# Patient Record
Sex: Female | Born: 1940
Health system: Southern US, Community
[De-identification: ages and names within clinical notes are randomized; demographics above are authoritative.]

## PROBLEM LIST (undated history)

## (undated) DIAGNOSIS — I10 Essential (primary) hypertension: Secondary | ICD-10-CM

## (undated) DIAGNOSIS — Z8619 Personal history of other infectious and parasitic diseases: Secondary | ICD-10-CM

## (undated) DIAGNOSIS — Z923 Personal history of irradiation: Secondary | ICD-10-CM

## (undated) DIAGNOSIS — M1990 Unspecified osteoarthritis, unspecified site: Secondary | ICD-10-CM

## (undated) DIAGNOSIS — K760 Fatty (change of) liver, not elsewhere classified: Secondary | ICD-10-CM

## (undated) DIAGNOSIS — K759 Inflammatory liver disease, unspecified: Secondary | ICD-10-CM

## (undated) DIAGNOSIS — T4145XA Adverse effect of unspecified anesthetic, initial encounter: Secondary | ICD-10-CM

## (undated) DIAGNOSIS — T8859XA Other complications of anesthesia, initial encounter: Secondary | ICD-10-CM

## (undated) DIAGNOSIS — E039 Hypothyroidism, unspecified: Secondary | ICD-10-CM

## (undated) DIAGNOSIS — K648 Other hemorrhoids: Secondary | ICD-10-CM

## (undated) DIAGNOSIS — K635 Polyp of colon: Secondary | ICD-10-CM

## (undated) DIAGNOSIS — K769 Liver disease, unspecified: Secondary | ICD-10-CM

## (undated) DIAGNOSIS — F419 Anxiety disorder, unspecified: Secondary | ICD-10-CM

## (undated) HISTORY — PX: THYROIDECTOMY, PARTIAL: SHX18

## (undated) HISTORY — DX: Other hemorrhoids: K64.8

## (undated) HISTORY — PX: OTHER SURGICAL HISTORY: SHX169

## (undated) HISTORY — PX: MEDIAL PARTIAL KNEE REPLACEMENT: SHX5965

## (undated) HISTORY — DX: Personal history of other infectious and parasitic diseases: Z86.19

## (undated) HISTORY — PX: EYE SURGERY: SHX253

## (undated) HISTORY — DX: Polyp of colon: K63.5

## (undated) HISTORY — PX: COLONOSCOPY: SHX174

## (undated) HISTORY — DX: Fatty (change of) liver, not elsewhere classified: K76.0

## (undated) HISTORY — PX: FOOT SURGERY: SHX648

## (undated) HISTORY — PX: APPENDECTOMY: SHX54

## (undated) HISTORY — PX: VARICOSE VEIN SURGERY: SHX832

## (undated) HISTORY — PX: ABDOMINAL HYSTERECTOMY: SHX81

## (undated) HISTORY — DX: Liver disease, unspecified: K76.9

## (undated) HISTORY — PX: BACK SURGERY: SHX140

## (undated) HISTORY — PX: FRACTURE SURGERY: SHX138

---

## 1998-04-10 ENCOUNTER — Other Ambulatory Visit: Admission: RE | Admit: 1998-04-10 | Discharge: 1998-04-10 | Payer: Self-pay | Admitting: Internal Medicine

## 1999-01-16 ENCOUNTER — Encounter: Payer: Self-pay | Admitting: Internal Medicine

## 1999-01-16 ENCOUNTER — Ambulatory Visit (HOSPITAL_COMMUNITY): Admission: RE | Admit: 1999-01-16 | Discharge: 1999-01-16 | Payer: Self-pay | Admitting: Internal Medicine

## 2000-01-23 ENCOUNTER — Ambulatory Visit (HOSPITAL_COMMUNITY): Admission: RE | Admit: 2000-01-23 | Discharge: 2000-01-23 | Payer: Self-pay | Admitting: Internal Medicine

## 2000-01-23 ENCOUNTER — Encounter: Payer: Self-pay | Admitting: Internal Medicine

## 2001-02-01 ENCOUNTER — Encounter: Payer: Self-pay | Admitting: Internal Medicine

## 2001-02-01 ENCOUNTER — Ambulatory Visit (HOSPITAL_COMMUNITY): Admission: RE | Admit: 2001-02-01 | Discharge: 2001-02-01 | Payer: Self-pay | Admitting: Internal Medicine

## 2001-02-09 ENCOUNTER — Encounter: Admission: RE | Admit: 2001-02-09 | Discharge: 2001-02-09 | Payer: Self-pay | Admitting: Internal Medicine

## 2001-02-09 ENCOUNTER — Encounter: Payer: Self-pay | Admitting: Internal Medicine

## 2001-02-18 ENCOUNTER — Ambulatory Visit (HOSPITAL_BASED_OUTPATIENT_CLINIC_OR_DEPARTMENT_OTHER): Admission: RE | Admit: 2001-02-18 | Discharge: 2001-02-18 | Payer: Self-pay | Admitting: General Surgery

## 2001-02-18 ENCOUNTER — Encounter: Payer: Self-pay | Admitting: General Surgery

## 2001-02-18 ENCOUNTER — Encounter (INDEPENDENT_AMBULATORY_CARE_PROVIDER_SITE_OTHER): Payer: Self-pay | Admitting: *Deleted

## 2001-02-18 ENCOUNTER — Encounter: Admission: RE | Admit: 2001-02-18 | Discharge: 2001-02-18 | Payer: Self-pay | Admitting: Internal Medicine

## 2001-02-18 ENCOUNTER — Encounter: Payer: Self-pay | Admitting: Internal Medicine

## 2002-02-23 ENCOUNTER — Encounter: Admission: RE | Admit: 2002-02-23 | Discharge: 2002-02-23 | Payer: Self-pay | Admitting: Internal Medicine

## 2002-02-23 ENCOUNTER — Encounter: Payer: Self-pay | Admitting: Internal Medicine

## 2003-03-01 ENCOUNTER — Encounter: Admission: RE | Admit: 2003-03-01 | Discharge: 2003-03-01 | Payer: Self-pay | Admitting: Internal Medicine

## 2003-03-01 ENCOUNTER — Encounter: Payer: Self-pay | Admitting: Internal Medicine

## 2003-09-21 ENCOUNTER — Ambulatory Visit (HOSPITAL_BASED_OUTPATIENT_CLINIC_OR_DEPARTMENT_OTHER): Admission: RE | Admit: 2003-09-21 | Discharge: 2003-09-21 | Payer: Self-pay | Admitting: Orthopedic Surgery

## 2004-03-06 ENCOUNTER — Ambulatory Visit (HOSPITAL_COMMUNITY): Admission: RE | Admit: 2004-03-06 | Discharge: 2004-03-06 | Payer: Self-pay | Admitting: Internal Medicine

## 2004-08-09 ENCOUNTER — Ambulatory Visit (HOSPITAL_COMMUNITY): Admission: RE | Admit: 2004-08-09 | Discharge: 2004-08-09 | Payer: Self-pay | Admitting: Internal Medicine

## 2005-04-03 ENCOUNTER — Ambulatory Visit (HOSPITAL_COMMUNITY): Admission: RE | Admit: 2005-04-03 | Discharge: 2005-04-03 | Payer: Self-pay | Admitting: Internal Medicine

## 2005-05-13 ENCOUNTER — Ambulatory Visit: Payer: Self-pay | Admitting: Sports Medicine

## 2005-09-16 ENCOUNTER — Ambulatory Visit: Payer: Self-pay | Admitting: Oncology

## 2005-11-07 ENCOUNTER — Ambulatory Visit (HOSPITAL_COMMUNITY): Admission: RE | Admit: 2005-11-07 | Discharge: 2005-11-07 | Payer: Self-pay | Admitting: Internal Medicine

## 2005-12-26 ENCOUNTER — Ambulatory Visit: Payer: Self-pay | Admitting: Oncology

## 2006-01-07 ENCOUNTER — Encounter (INDEPENDENT_AMBULATORY_CARE_PROVIDER_SITE_OTHER): Payer: Self-pay | Admitting: *Deleted

## 2006-01-07 ENCOUNTER — Ambulatory Visit (HOSPITAL_COMMUNITY): Admission: RE | Admit: 2006-01-07 | Discharge: 2006-01-07 | Payer: Self-pay | Admitting: Obstetrics and Gynecology

## 2006-01-22 ENCOUNTER — Ambulatory Visit: Payer: Self-pay | Admitting: Sports Medicine

## 2006-02-05 ENCOUNTER — Ambulatory Visit: Payer: Self-pay | Admitting: Sports Medicine

## 2006-02-06 ENCOUNTER — Encounter: Admission: RE | Admit: 2006-02-06 | Discharge: 2006-02-06 | Payer: Self-pay | Admitting: Sports Medicine

## 2006-02-16 ENCOUNTER — Ambulatory Visit (HOSPITAL_COMMUNITY): Admission: RE | Admit: 2006-02-16 | Discharge: 2006-02-17 | Payer: Self-pay | Admitting: Neurosurgery

## 2006-04-08 ENCOUNTER — Ambulatory Visit (HOSPITAL_COMMUNITY): Admission: RE | Admit: 2006-04-08 | Discharge: 2006-04-08 | Payer: Self-pay | Admitting: Internal Medicine

## 2006-06-25 ENCOUNTER — Encounter: Admission: RE | Admit: 2006-06-25 | Discharge: 2006-08-06 | Payer: Self-pay | Admitting: Neurosurgery

## 2006-09-25 ENCOUNTER — Ambulatory Visit: Payer: Self-pay | Admitting: Internal Medicine

## 2006-10-06 ENCOUNTER — Ambulatory Visit: Payer: Self-pay | Admitting: Internal Medicine

## 2006-10-08 ENCOUNTER — Encounter: Admission: RE | Admit: 2006-10-08 | Discharge: 2006-10-08 | Payer: Self-pay | Admitting: Internal Medicine

## 2007-01-28 ENCOUNTER — Ambulatory Visit: Payer: Self-pay | Admitting: Sports Medicine

## 2007-01-28 DIAGNOSIS — I10 Essential (primary) hypertension: Secondary | ICD-10-CM | POA: Insufficient documentation

## 2007-01-28 DIAGNOSIS — M949 Disorder of cartilage, unspecified: Secondary | ICD-10-CM

## 2007-01-28 DIAGNOSIS — M719 Bursopathy, unspecified: Secondary | ICD-10-CM

## 2007-01-28 DIAGNOSIS — M19049 Primary osteoarthritis, unspecified hand: Secondary | ICD-10-CM | POA: Insufficient documentation

## 2007-01-28 DIAGNOSIS — M899 Disorder of bone, unspecified: Secondary | ICD-10-CM | POA: Insufficient documentation

## 2007-01-28 DIAGNOSIS — M67919 Unspecified disorder of synovium and tendon, unspecified shoulder: Secondary | ICD-10-CM | POA: Insufficient documentation

## 2007-03-04 ENCOUNTER — Ambulatory Visit: Payer: Self-pay | Admitting: Sports Medicine

## 2007-04-08 ENCOUNTER — Ambulatory Visit: Payer: Self-pay | Admitting: Sports Medicine

## 2007-05-04 ENCOUNTER — Ambulatory Visit (HOSPITAL_COMMUNITY): Admission: RE | Admit: 2007-05-04 | Discharge: 2007-05-04 | Payer: Self-pay | Admitting: Internal Medicine

## 2007-05-04 ENCOUNTER — Encounter: Payer: Self-pay | Admitting: Sports Medicine

## 2007-05-10 ENCOUNTER — Encounter: Admission: RE | Admit: 2007-05-10 | Discharge: 2007-05-10 | Payer: Self-pay | Admitting: Orthopedic Surgery

## 2007-05-14 ENCOUNTER — Encounter: Admission: RE | Admit: 2007-05-14 | Discharge: 2007-05-14 | Payer: Self-pay | Admitting: Internal Medicine

## 2007-05-24 ENCOUNTER — Encounter: Admission: RE | Admit: 2007-05-24 | Discharge: 2007-05-24 | Payer: Self-pay | Admitting: Orthopedic Surgery

## 2007-06-02 ENCOUNTER — Encounter: Payer: Self-pay | Admitting: Sports Medicine

## 2007-06-15 ENCOUNTER — Encounter: Admission: RE | Admit: 2007-06-15 | Discharge: 2007-06-15 | Payer: Self-pay | Admitting: Orthopedic Surgery

## 2007-07-07 ENCOUNTER — Encounter: Payer: Self-pay | Admitting: Sports Medicine

## 2007-08-16 ENCOUNTER — Ambulatory Visit: Payer: Self-pay | Admitting: Oncology

## 2007-08-30 ENCOUNTER — Telehealth: Payer: Self-pay | Admitting: Sports Medicine

## 2007-09-01 ENCOUNTER — Telehealth: Payer: Self-pay | Admitting: Sports Medicine

## 2007-09-06 ENCOUNTER — Encounter: Payer: Self-pay | Admitting: Sports Medicine

## 2007-09-08 ENCOUNTER — Encounter (INDEPENDENT_AMBULATORY_CARE_PROVIDER_SITE_OTHER): Payer: Self-pay | Admitting: *Deleted

## 2007-09-16 ENCOUNTER — Ambulatory Visit: Payer: Self-pay | Admitting: Family Medicine

## 2007-09-16 DIAGNOSIS — G608 Other hereditary and idiopathic neuropathies: Secondary | ICD-10-CM | POA: Insufficient documentation

## 2007-10-27 ENCOUNTER — Ambulatory Visit: Payer: Self-pay | Admitting: Sports Medicine

## 2007-11-03 ENCOUNTER — Encounter: Payer: Self-pay | Admitting: Sports Medicine

## 2007-11-05 ENCOUNTER — Ambulatory Visit: Payer: Self-pay | Admitting: Family Medicine

## 2007-11-05 DIAGNOSIS — E785 Hyperlipidemia, unspecified: Secondary | ICD-10-CM | POA: Insufficient documentation

## 2007-11-05 LAB — CONVERTED CEMR LAB
Cholesterol: 167 mg/dL
HDL: 45 mg/dL
LDL Cholesterol: 79 mg/dL
Triglycerides: 197 mg/dL

## 2007-11-08 ENCOUNTER — Encounter: Payer: Self-pay | Admitting: Family Medicine

## 2007-11-08 ENCOUNTER — Encounter: Payer: Self-pay | Admitting: *Deleted

## 2007-11-15 ENCOUNTER — Telehealth: Payer: Self-pay | Admitting: *Deleted

## 2007-11-22 ENCOUNTER — Ambulatory Visit: Payer: Self-pay | Admitting: Family Medicine

## 2007-12-02 ENCOUNTER — Encounter: Admission: RE | Admit: 2007-12-02 | Discharge: 2007-12-02 | Payer: Self-pay | Admitting: Neurosurgery

## 2007-12-22 ENCOUNTER — Telehealth: Payer: Self-pay | Admitting: *Deleted

## 2007-12-23 ENCOUNTER — Telehealth: Payer: Self-pay | Admitting: Family Medicine

## 2007-12-31 ENCOUNTER — Ambulatory Visit: Payer: Self-pay | Admitting: Family Medicine

## 2007-12-31 LAB — CONVERTED CEMR LAB
Direct LDL: 169 mg/dL — ABNORMAL HIGH
TSH: 0.529 microintl units/mL (ref 0.350–5.50)

## 2008-01-05 ENCOUNTER — Encounter: Payer: Self-pay | Admitting: Family Medicine

## 2008-01-06 ENCOUNTER — Encounter: Admission: RE | Admit: 2008-01-06 | Discharge: 2008-01-07 | Payer: Self-pay | Admitting: Physician Assistant

## 2008-02-17 ENCOUNTER — Telehealth: Payer: Self-pay | Admitting: *Deleted

## 2008-02-29 ENCOUNTER — Encounter: Admission: RE | Admit: 2008-02-29 | Discharge: 2008-03-23 | Payer: Self-pay | Admitting: Orthopedic Surgery

## 2008-03-01 ENCOUNTER — Ambulatory Visit: Payer: Self-pay | Admitting: Family Medicine

## 2008-04-12 ENCOUNTER — Telehealth: Payer: Self-pay | Admitting: Family Medicine

## 2008-04-12 ENCOUNTER — Ambulatory Visit: Payer: Self-pay | Admitting: Family Medicine

## 2008-04-12 ENCOUNTER — Telehealth: Payer: Self-pay | Admitting: *Deleted

## 2008-05-01 ENCOUNTER — Encounter: Payer: Self-pay | Admitting: Sports Medicine

## 2008-05-04 ENCOUNTER — Encounter: Admission: RE | Admit: 2008-05-04 | Discharge: 2008-05-04 | Payer: Self-pay | Admitting: Family Medicine

## 2008-05-19 ENCOUNTER — Encounter: Admission: RE | Admit: 2008-05-19 | Discharge: 2008-06-01 | Payer: Self-pay | Admitting: Orthopedic Surgery

## 2008-05-24 ENCOUNTER — Ambulatory Visit: Payer: Self-pay | Admitting: Family Medicine

## 2008-06-30 ENCOUNTER — Telehealth: Payer: Self-pay | Admitting: *Deleted

## 2008-06-30 ENCOUNTER — Encounter: Payer: Self-pay | Admitting: Family Medicine

## 2008-07-26 ENCOUNTER — Ambulatory Visit: Payer: Self-pay | Admitting: Family Medicine

## 2008-07-26 DIAGNOSIS — E039 Hypothyroidism, unspecified: Secondary | ICD-10-CM | POA: Insufficient documentation

## 2008-07-26 LAB — CONVERTED CEMR LAB
ALT: 14 units/L (ref 0–35)
AST: 16 units/L (ref 0–37)
Albumin: 4.4 g/dL (ref 3.5–5.2)
Alkaline Phosphatase: 66 units/L (ref 39–117)
BUN: 27 mg/dL — ABNORMAL HIGH (ref 6–23)
CO2: 25 meq/L (ref 19–32)
Calcium: 9.6 mg/dL (ref 8.4–10.5)
Chloride: 100 meq/L (ref 96–112)
Creatinine, Ser: 0.99 mg/dL (ref 0.40–1.20)
Direct LDL: 128 mg/dL — ABNORMAL HIGH
Glucose, Bld: 83 mg/dL (ref 70–99)
Potassium: 5 meq/L (ref 3.5–5.3)
Sodium: 138 meq/L (ref 135–145)
TSH: 0.688 microintl units/mL (ref 0.350–4.50)
Total Bilirubin: 0.3 mg/dL (ref 0.3–1.2)
Total Protein: 7 g/dL (ref 6.0–8.3)

## 2008-07-28 ENCOUNTER — Encounter: Payer: Self-pay | Admitting: Family Medicine

## 2008-08-08 ENCOUNTER — Encounter: Payer: Self-pay | Admitting: Family Medicine

## 2008-08-17 ENCOUNTER — Encounter: Payer: Self-pay | Admitting: Family Medicine

## 2008-09-08 ENCOUNTER — Ambulatory Visit: Payer: Self-pay | Admitting: Family Medicine

## 2008-09-26 ENCOUNTER — Telehealth (INDEPENDENT_AMBULATORY_CARE_PROVIDER_SITE_OTHER): Payer: Self-pay | Admitting: *Deleted

## 2008-10-11 ENCOUNTER — Ambulatory Visit: Payer: Self-pay | Admitting: Family Medicine

## 2008-10-13 ENCOUNTER — Encounter: Payer: Self-pay | Admitting: Family Medicine

## 2008-11-07 ENCOUNTER — Encounter: Payer: Self-pay | Admitting: Family Medicine

## 2008-11-29 ENCOUNTER — Ambulatory Visit: Payer: Self-pay | Admitting: Family Medicine

## 2008-12-28 ENCOUNTER — Encounter: Payer: Self-pay | Admitting: Family Medicine

## 2009-01-23 ENCOUNTER — Encounter: Payer: Self-pay | Admitting: Family Medicine

## 2009-02-08 ENCOUNTER — Telehealth (INDEPENDENT_AMBULATORY_CARE_PROVIDER_SITE_OTHER): Payer: Self-pay | Admitting: *Deleted

## 2009-02-15 ENCOUNTER — Encounter: Payer: Self-pay | Admitting: Family Medicine

## 2009-02-16 ENCOUNTER — Encounter: Payer: Self-pay | Admitting: Family Medicine

## 2009-02-20 ENCOUNTER — Ambulatory Visit: Payer: Self-pay | Admitting: Family Medicine

## 2009-02-20 ENCOUNTER — Encounter (INDEPENDENT_AMBULATORY_CARE_PROVIDER_SITE_OTHER): Payer: Self-pay | Admitting: Family Medicine

## 2009-02-20 ENCOUNTER — Telehealth: Payer: Self-pay | Admitting: Family Medicine

## 2009-02-21 LAB — CONVERTED CEMR LAB
ALT: 23 units/L (ref 0–35)
AST: 22 units/L (ref 0–37)
Albumin: 4.6 g/dL (ref 3.5–5.2)
Alkaline Phosphatase: 62 units/L (ref 39–117)
BUN: 19 mg/dL (ref 6–23)
CO2: 25 meq/L (ref 19–32)
Calcium: 9.6 mg/dL (ref 8.4–10.5)
Chloride: 93 meq/L — ABNORMAL LOW (ref 96–112)
Creatinine, Ser: 1.04 mg/dL (ref 0.40–1.20)
Glucose, Bld: 107 mg/dL — ABNORMAL HIGH (ref 70–99)
Potassium: 5.3 meq/L (ref 3.5–5.3)
Sodium: 130 meq/L — ABNORMAL LOW (ref 135–145)
Total Bilirubin: 0.3 mg/dL (ref 0.3–1.2)
Total Protein: 7.2 g/dL (ref 6.0–8.3)

## 2009-02-28 ENCOUNTER — Ambulatory Visit: Payer: Self-pay | Admitting: Family Medicine

## 2009-02-28 DIAGNOSIS — R5383 Other fatigue: Secondary | ICD-10-CM | POA: Insufficient documentation

## 2009-02-28 DIAGNOSIS — R5381 Other malaise: Secondary | ICD-10-CM | POA: Insufficient documentation

## 2009-02-28 LAB — CONVERTED CEMR LAB
ALT: 17 units/L (ref 0–35)
AST: 19 units/L (ref 0–37)
Albumin: 4.6 g/dL (ref 3.5–5.2)
Alkaline Phosphatase: 65 units/L (ref 39–117)
BUN: 15 mg/dL (ref 6–23)
Basophils Absolute: 0 10*3/uL (ref 0.0–0.1)
Basophils Relative: 0 % (ref 0–1)
CO2: 27 meq/L (ref 19–32)
Calcium: 9.4 mg/dL (ref 8.4–10.5)
Chloride: 99 meq/L (ref 96–112)
Creatinine, Ser: 0.93 mg/dL (ref 0.40–1.20)
Eosinophils Absolute: 0.2 10*3/uL (ref 0.0–0.7)
Eosinophils Relative: 2 % (ref 0–5)
Glucose, Bld: 81 mg/dL (ref 70–99)
HCT: 39.1 % (ref 36.0–46.0)
Hemoglobin: 12.5 g/dL (ref 12.0–15.0)
Lymphocytes Relative: 19 % (ref 12–46)
Lymphs Abs: 1.9 10*3/uL (ref 0.7–4.0)
MCHC: 32 g/dL (ref 30.0–36.0)
MCV: 102.9 fL — ABNORMAL HIGH (ref 78.0–100.0)
Monocytes Absolute: 1 10*3/uL (ref 0.1–1.0)
Monocytes Relative: 10 % (ref 3–12)
Neutro Abs: 6.8 10*3/uL (ref 1.7–7.7)
Neutrophils Relative %: 69 % (ref 43–77)
Platelets: 353 10*3/uL (ref 150–400)
Potassium: 4.5 meq/L (ref 3.5–5.3)
RBC: 3.8 M/uL — ABNORMAL LOW (ref 3.87–5.11)
RDW: 13.3 % (ref 11.5–15.5)
Sodium: 138 meq/L (ref 135–145)
TSH: 0.19 microintl units/mL — ABNORMAL LOW (ref 0.350–4.500)
Total Bilirubin: 0.3 mg/dL (ref 0.3–1.2)
Total Protein: 7 g/dL (ref 6.0–8.3)
Vitamin B-12: 874 pg/mL (ref 211–911)
WBC: 9.9 10*3/uL (ref 4.0–10.5)

## 2009-03-01 ENCOUNTER — Telehealth: Payer: Self-pay | Admitting: Family Medicine

## 2009-03-02 ENCOUNTER — Encounter: Payer: Self-pay | Admitting: Family Medicine

## 2009-04-04 ENCOUNTER — Ambulatory Visit: Payer: Self-pay | Admitting: Family Medicine

## 2009-04-04 DIAGNOSIS — D518 Other vitamin B12 deficiency anemias: Secondary | ICD-10-CM | POA: Insufficient documentation

## 2009-04-04 DIAGNOSIS — D531 Other megaloblastic anemias, not elsewhere classified: Secondary | ICD-10-CM | POA: Insufficient documentation

## 2009-04-04 LAB — CONVERTED CEMR LAB
Free T4: 1.27 ng/dL (ref 0.80–1.80)
T3, Free: 3.2 pg/mL (ref 2.3–4.2)
TSH: 0.565 microintl units/mL (ref 0.350–4.500)

## 2009-04-05 ENCOUNTER — Encounter: Payer: Self-pay | Admitting: Family Medicine

## 2009-04-24 ENCOUNTER — Encounter: Payer: Self-pay | Admitting: Family Medicine

## 2009-05-16 ENCOUNTER — Telehealth: Payer: Self-pay | Admitting: Family Medicine

## 2009-05-17 ENCOUNTER — Ambulatory Visit: Payer: Self-pay | Admitting: Family Medicine

## 2009-05-22 ENCOUNTER — Encounter: Payer: Self-pay | Admitting: Family Medicine

## 2009-05-29 ENCOUNTER — Encounter: Admission: RE | Admit: 2009-05-29 | Discharge: 2009-05-29 | Payer: Self-pay | Admitting: Family Medicine

## 2009-05-31 ENCOUNTER — Encounter: Payer: Self-pay | Admitting: Family Medicine

## 2009-06-05 ENCOUNTER — Ambulatory Visit: Payer: Self-pay | Admitting: Family Medicine

## 2009-06-05 DIAGNOSIS — M79609 Pain in unspecified limb: Secondary | ICD-10-CM | POA: Insufficient documentation

## 2009-06-06 ENCOUNTER — Ambulatory Visit: Payer: Self-pay | Admitting: Family Medicine

## 2009-06-06 DIAGNOSIS — L600 Ingrowing nail: Secondary | ICD-10-CM | POA: Insufficient documentation

## 2009-06-18 ENCOUNTER — Telehealth: Payer: Self-pay | Admitting: Family Medicine

## 2009-07-12 ENCOUNTER — Ambulatory Visit: Payer: Self-pay | Admitting: Family Medicine

## 2009-08-10 ENCOUNTER — Telehealth (INDEPENDENT_AMBULATORY_CARE_PROVIDER_SITE_OTHER): Payer: Self-pay | Admitting: *Deleted

## 2009-08-15 ENCOUNTER — Ambulatory Visit: Payer: Self-pay | Admitting: Family Medicine

## 2009-08-15 DIAGNOSIS — G4700 Insomnia, unspecified: Secondary | ICD-10-CM

## 2009-08-15 DIAGNOSIS — F5104 Psychophysiologic insomnia: Secondary | ICD-10-CM | POA: Insufficient documentation

## 2009-12-05 ENCOUNTER — Ambulatory Visit: Payer: Self-pay | Admitting: Family Medicine

## 2009-12-05 DIAGNOSIS — G894 Chronic pain syndrome: Secondary | ICD-10-CM | POA: Insufficient documentation

## 2009-12-05 LAB — CONVERTED CEMR LAB
Cholesterol: 177 mg/dL (ref 0–200)
HDL: 46 mg/dL (ref >39)
LDL Cholesterol: 89 mg/dL (ref 0–99)
TSH: 0.71 microintl units/mL (ref 0.350–4.500)
Total CHOL/HDL Ratio: 3.8
Triglycerides: 210 mg/dL — ABNORMAL HIGH (ref <150)
VLDL: 42 mg/dL — ABNORMAL HIGH (ref 0–40)
Vitamin B-12: 1474 pg/mL — ABNORMAL HIGH (ref 211–911)

## 2009-12-07 ENCOUNTER — Encounter: Payer: Self-pay | Admitting: Family Medicine

## 2009-12-20 ENCOUNTER — Telehealth: Payer: Self-pay | Admitting: Family Medicine

## 2010-04-09 ENCOUNTER — Encounter: Payer: Self-pay | Admitting: Family Medicine

## 2010-04-10 ENCOUNTER — Encounter: Payer: Self-pay | Admitting: Family Medicine

## 2010-04-11 ENCOUNTER — Encounter: Admission: RE | Admit: 2010-04-11 | Discharge: 2010-04-11 | Payer: Self-pay | Admitting: Unknown Physician Specialty

## 2010-04-15 ENCOUNTER — Ambulatory Visit (HOSPITAL_BASED_OUTPATIENT_CLINIC_OR_DEPARTMENT_OTHER): Admission: RE | Admit: 2010-04-15 | Discharge: 2010-04-16 | Payer: Self-pay | Admitting: Unknown Physician Specialty

## 2010-05-07 ENCOUNTER — Telehealth: Payer: Self-pay | Admitting: Family Medicine

## 2010-07-22 ENCOUNTER — Encounter: Admission: RE | Admit: 2010-07-22 | Discharge: 2010-07-22 | Payer: Self-pay | Admitting: Family Medicine

## 2010-08-14 ENCOUNTER — Ambulatory Visit: Payer: Self-pay | Admitting: Family Medicine

## 2010-08-14 LAB — CONVERTED CEMR LAB
ALT: 14 units/L (ref 0–35)
AST: 19 units/L (ref 0–37)
Albumin: 4.6 g/dL (ref 3.5–5.2)
Alkaline Phosphatase: 56 units/L (ref 39–117)
BUN: 17 mg/dL (ref 6–23)
CO2: 26 meq/L (ref 19–32)
Calcium: 9.3 mg/dL (ref 8.4–10.5)
Chloride: 98 meq/L (ref 96–112)
Creatinine, Ser: 0.95 mg/dL (ref 0.40–1.20)
Glucose, Bld: 84 mg/dL (ref 70–99)
Potassium: 4.9 meq/L (ref 3.5–5.3)
Sodium: 136 meq/L (ref 135–145)
TSH: 0.373 microintl units/mL (ref 0.350–4.500)
Total Bilirubin: 0.3 mg/dL (ref 0.3–1.2)
Total Protein: 6.6 g/dL (ref 6.0–8.3)

## 2010-08-19 ENCOUNTER — Encounter: Payer: Self-pay | Admitting: Family Medicine

## 2010-10-25 ENCOUNTER — Encounter: Payer: Self-pay | Admitting: *Deleted

## 2010-11-12 ENCOUNTER — Encounter: Payer: Self-pay | Admitting: Family Medicine

## 2010-12-16 ENCOUNTER — Encounter
Admission: RE | Admit: 2010-12-16 | Discharge: 2010-12-16 | Payer: Self-pay | Source: Home / Self Care | Attending: Internal Medicine | Admitting: Internal Medicine

## 2010-12-16 ENCOUNTER — Encounter: Payer: Self-pay | Admitting: Family Medicine

## 2010-12-19 ENCOUNTER — Other Ambulatory Visit (HOSPITAL_COMMUNITY): Payer: Self-pay | Admitting: Internal Medicine

## 2010-12-19 DIAGNOSIS — E041 Nontoxic single thyroid nodule: Secondary | ICD-10-CM

## 2010-12-23 ENCOUNTER — Other Ambulatory Visit: Payer: Self-pay | Admitting: Endocrinology

## 2010-12-23 DIAGNOSIS — E041 Nontoxic single thyroid nodule: Secondary | ICD-10-CM

## 2010-12-24 NOTE — Assessment & Plan Note (Signed)
Summary: routine visit/el   Vital Signs:  Patient Profile:   70 Years Old Female Height:     64 inches Pulse rate:   67 / minute BP sitting:   110 / 63  (right arm)  Pt. in pain?   yes    Location:   right shoulder    Intensity:   4  Vitals Entered By: Arlyss Repress CMA, (March 01, 2008 11:24 AM)                  Chief Complaint:  low BP. right shoulder pain.  History of Present Illness: had surgery on foot at [facility]. Went well. Incast 2 months and now fracture boot w 1/3 wt bearing alloweed. Noted her BP has been running really low---has decreased diuretic tab to 3x per week and is doing fine but pressure still much lower.  wasstarted on soma for back spasm  right shoulder giving her fits--she has tentatively been dx w rotator cuff problems and previous "spur' difficulty sleeping at night because of this and difficulty w anything she has to do overhead.  has noted more "hot flashes' since her surgery--has been off estrogen for 2 years.    Current Allergies: * DILAUDID * FENTANYL  Past Medical History:    Reviewed history from 11/05/2007 and no changes required:       goiter - multinodular - dr sevier       Hypertension       Osteoarthritis       Osteopenia       S/P laminectomy L4 for synovial cyst with neural impingement       Dr Hassan Buckler is rheumatologist  Past Surgical History:    Acromioplasty for spurs ; DJD in 2002 for both rt and lt shoulders    L5-S1 fusion 2008 Dr Lucilla Lame    left ankle surgery--tendon transfer and achilles lengthening at [facility] 12/2007     Review of Systems  The patient denies fever, weight loss, chest pain, syncope, dyspnea on exhertion, peripheral edema, and abdominal pain.     Physical Exam  General:     alert and well-developed.   Eyes:     pupils round.   Neck:     supple, full ROM, and no masses.   Lungs:     normal respiratory effort and normal breath sounds.   Heart:     normal rate, regular rhythm, and no murmur.     Msk:     left ankle in fracture boot---using scooter for mobility R shoulder painful w elevation above 90 degrees. strength seems intact and B=. pulses B 2+ radial. normal vascular and sensory exam. supraspinatus testing reveals decreased arom secondary to pain esp on right    Impression & Recommendations:  Problem # 1:  ROTATOR CUFF INJURY, RIGHT SHOULDER (ICD-726.10) Assessment: Deteriorated she is already in PT for her ankle and the therapist gave her some exercises to do for rotator cuff. today we injected for pain relief and she is encouraged to be aggressive w PT of that shoulder --think that better approach that surgical if we can get pain relief Orders: [facility]- Est  Level 4 (99214) Injection, intermediate joint - [facility] (95284) Patient given informed consent for injection. Discussed possible complications ofinfection, bleeding or skin atrophy at site of injection. Possible side effect of avascular necrosis (focal area of bone death) due to steroid use. Are cleaned and prepped in usual sterile fashion. A ---1cc- cc kennalog plus --3--cc 1% lidocaine without epinephrine  was injected into the-right subacromial bursa--. Patient btolerated procedure well with no complications.   Problem # 2:  HYPERTENSION (ICD-401.9) Assessment: Improved  Her updated medication list for this problem includes:    Atenolol 50 Mg Tabs (Atenolol) ..... Once daily    Accupril 10 Mg Tabs (Quinapril hcl) ..... Once daily    Demadex 10 Mg Tabs (Torsemide) ..... On hold will put her demodex on hold as she is well controlled now without it f/u 4-6 weeks Orders: [facility]- Est  Level 4 (32440)   Problem # 3:  MENTAL CONFUSION (ICD-298.9) Assessment: Improved seems much more with it and focused today will remove from her problem list  Problem # 4:  HOT FLASHES (ICD-627.2) Assessment: New unclear what this is. will follow and receck in 4-6 weeks. late menopausal changes? has been effectively off estrogen for almost  2 years so unlikely. do not think restarting estrogen or hrt would be wise at this time, esp given her immobility of LE Orders: [facility]- Est  Level 4 (99214)   Problem # 5:  BACK PAIN (ICD-724.5)  Her updated medication list for this problem includes:    Tramadol Hcl 50 Mg Tabs (Tramadol hcl) .Marland Kitchen... Take 1 tablet by mouth  at bedtime if needed    Methocarbamol 750 Mg Tabs (Methocarbamol) .Marland Kitchen... 1 by mouth up to q 8 prn she is also using some robaxinthat her surgeon put her on and that is helping a great deal Orders: [facility]- Est  Level 4 (10272)   Complete Medication List: 1)  Gabarone 300 Mg Tabs (Gabapentin) .... Taper up to 1 am/ 1 lunch /1 4pm and 3 by mouth at bedtime. 2)  Atenolol 50 Mg Tabs (Atenolol) .... Once daily 3)  Accupril 10 Mg Tabs (Quinapril hcl) .... Once daily 4)  Demadex 10 Mg Tabs (Torsemide) .... On hold 5)  Synthroid 125 Mcg Tabs (Levothyroxine sodium) .Marland Kitchen.. 1 tab by mouth once daily five days a week and 1/2 tab by mouth 2 days a week 6)  Pravachol 20 Mg Tabs (Pravastatin sodium) .... On hold 7)  Clonazepam 2 Mg Tabs (Clonazepam) .Marland Kitchen.. 1 by mouth at bedtime as needed insomnia; try to discontinue and do not use with tramodol 8)  Cymbalta 30 Mg Cpep (Duloxetine hcl) .... Once daily 9)  Tramadol Hcl 50 Mg Tabs (Tramadol hcl) .... Take 1 tablet by mouth  at bedtime if needed 10)  Methocarbamol 750 Mg Tabs (Methocarbamol) .Marland Kitchen.. 1 by mouth up to q 8 prn     ]

## 2010-12-24 NOTE — Assessment & Plan Note (Signed)
Summary: f/u   Vital Signs:  Patient profile:   70 year old female Weight:      154 pounds Temp:     98.4 degrees F oral Pulse rate:   74 / minute Pulse rhythm:   regular BP sitting:   133 / 68  (left arm) Cuff size:   regular  Vitals Entered By: Loralee Pacas CMA (August 14, 2010 10:12 AM) CC: cpe   CC:  cpe.  History of Present Illness: Follow up hypertension. Taking medicines regularly with no problems. Not having any any headaches or chest pains. Having som eproblems getting refills through her pharmacy.  2) Orthopedic issues: seeing Dr Lajoyce Corners for her foot--had surgery on her right  foot (3 toes shortened) and seeing  another doctor for her left shoulder (recent rotator cuff tear), now in PT for that, continues to be [facility] back surgeon at [facility].  3) seeing Dr Vear Clock at pain clinic--he handles all of her pain meds  4) having some insomnia even tho she is still on oxazepam--wants to know if there is anything else--  5) has questions about how many of her supplements (vitamins etc) she needs currently on biotin, calcium, vitamin D, folic acid, vitamin B12 and vitamon C   Habits & Providers  Alcohol-Tobacco-Diet     Tobacco Status: never  Current Medications (verified): 1)  Gabapentin 600 Mg Tabs (Gabapentin) .... 2 By Mouth At Bedtime and 1/2 Tab By Mouth Q Mid-Day 2)  Atenolol 50 Mg  Tabs (Atenolol) .... Once Daily 3)  Accupril 5 Mg  Tabs (Quinapril Hcl) .Marland Kitchen.. 1 By Mouth Once Daily 4)  Synthroid 125 Mcg  Tabs (Levothyroxine Sodium) .Marland Kitchen.. 1 Tab By Mouth Once Daily Five Days A Week and 1/2 Tab By Mouth 2 Days A Week 5)  Pravachol 20 Mg  Tabs (Pravastatin Sodium) .... Take One Tablet At Night 6)  Oxazepam 15 Mg Caps (Oxazepam) .Marland Kitchen.. 1-2 By Mouth At Bedtime As Needed Insomnia 7)  Calcium 600/vitamin D 600-400 Mg-Unit Chew (Calcium Carbonate-Vitamin D) .... Two Times A Day 8)  B-12 250 Mcg Tabs (Cyanocobalamin) .Marland Kitchen.. 1-2 By Mouth Qd 9)  Ibuprofen 400 Mg Tabs  (Ibuprofen) .... Two Times A Day As Needed For Pain 10)  Acetaminophen 500 Mg Tabs (Acetaminophen) .Marland Kitchen.. 1 As Needed For Pain - Up To A Total of 4 Pills Per Day 11)  Fish Oil  Oil (Fish Oil) .... 2 Pills Taily - One in The Am and One in The Pm 12)  Biotin 300 Mcg Tabs (Biotin) .... One Daily 13)  Folic Acid 400 Mcg Tabs (Folic Acid) .... One Daily 14)  Vicodin 5-500 Mg Tabs (Hydrocodone-Acetaminophen) .Marland Kitchen.. 1-2 By Mouth Qday Per [facility] Dr Vear Clock 15)  Doxepin Hcl 10 Mg Caps (Doxepin Hcl) .Marland Kitchen.. 1 By Mouth Once Daily Per Pain Clinic  Allergies: 1)  ! Voltaren (Diclofenac Sodium) (Diclofenac Sodium) 2)  * Dilaudid 3)  * Fentanyl  Physical Exam  General:  alert, well-developed, well-nourished, and well-hydrated.   Neck:  supple, full ROM, and no masses.   Lungs:  normal respiratory effort and normal breath sounds.   Heart:  normal rate, regular rhythm, and no murmur.     Impression & Recommendations:  Problem # 1:  HYPERTENSION (ICD-401.9) Orders: Comp Met[facility] ([id number]) [facility]- Est  Level 4 (99214)no med changes  Problem # 2:  HYPERLIPIDEMIA (ICD-272.4) Orders: Comp [facility] ([id number]) [facility]- Est  Level 4 (99214)check lfts  Problem # 3:  CHRONIC PAIN SYNDROME (ICD-338.4) continue  follow up with pain clinic--I agree she should get all of her pain medications from there.  Problem # 4:  INSOMNIA (ICD-780.52) given her current level of medications, I am not comfortable adding anything. I doubt the oxazepam is helpful  at this point but she is not willing to taper off it  to try something different so we will make no changes currently.  Problem # 5:  UNSPECIFIED HYPOTHYROIDISM (ICD-244.9)  Orders: TSH-[facility] ([id number]) [facility]- Est  Level 4 (09811) check tsh  Complete Medication List: 1)  Gabapentin 600 Mg Tabs (Gabapentin) .... 2 by mouth at bedtime and 1/2 tab by mouth q mid-day 2)  Atenolol 50 Mg Tabs (Atenolol) .... Once daily 3)  Accupril 5 Mg Tabs (Quinapril hcl) .Marland Kitchen.. 1  by mouth once daily 4)  Synthroid 125 Mcg Tabs (Levothyroxine sodium) .Marland Kitchen.. 1 tab by mouth once daily five days a week and 1/2 tab by mouth 2 days a week 5)  Pravachol 20 Mg Tabs (Pravastatin sodium) .... Take one tablet at night 6)  Oxazepam 15 Mg Caps (Oxazepam) .Marland Kitchen.. 1-2 by mouth at bedtime as needed insomnia 7)  Calcium 600/vitamin D 600-400 Mg-unit Chew (Calcium carbonate-vitamin d) .... Two times a day 8)  B-12 250 Mcg Tabs (Cyanocobalamin) .Marland Kitchen.. 1-2 by mouth qd 9)  Ibuprofen 400 Mg Tabs (Ibuprofen) .... Two times a day as needed for pain 10)  Acetaminophen 500 Mg Tabs (Acetaminophen) .Marland Kitchen.. 1 as needed for pain - up to a total of 4 pills per day 11)  Fish Oil Oil (Fish oil) .... 2 pills taily - one in the am and one in the pm 12)  Biotin 300 Mcg Tabs (Biotin) .... One daily 13)  Folic Acid 400 Mcg Tabs (Folic acid) .... One daily 14)  Vicodin 5-500 Mg Tabs (Hydrocodone-acetaminophen) .Marland Kitchen.. 1-2 by mouth qday per pain clinic dr phillips 15)  Doxepin Hcl 10 Mg Caps (Doxepin hcl) .Marland Kitchen.. 1 by mouth once daily per pain clinic Comp Met-[facility] [phone]) TSH-[facility] [phone]) [facility]- Est  Level 4 (96295)   Impression & Recommendations:  Her updated medication list for this problem includes:    Atenolol 50 Mg Tabs (Atenolol) ..... Once daily    Accupril 5 Mg Tabs (Quinapril hcl) .Marland Kitchen... 1 by mouth once daily  Orders: Comp [facility] [phone])   Orders: Comp [facility] ([id number]) [facility]- Est  Level 4 (03474)    Her updated medication list for this problem includes:    Pravachol 20 Mg Tabs (Pravastatin sodium) .Marland Kitchen... Take one tablet at night  Orders: Comp Met[facility] [phone])   Orders: Comp Met[facility] ([id number]) [facility]- Est  Level 4 (16606)    Her updated medication list for this problem includes:    Synthroid 125 Mcg Tabs (Levothyroxine sodium) .Marland Kitchen... 1 tab by mouth once daily five days a week and 1/2 tab by mouth 2 days a week  Orders: TSH-[facility] ([id number]) [facility]- Est  Level 4  (35573)   Complete Medication List: 1)  Gabapentin 600 Mg Tabs (Gabapentin) .... 2 by mouth at bedtime and 1/2 tab by mouth q mid-day 2)  Atenolol 50 Mg Tabs (Atenolol) .... Once daily 3)  Accupril 5 Mg Tabs (Quinapril hcl) .Marland Kitchen.. 1 by mouth once daily 4)  Synthroid 125 Mcg Tabs (Levothyroxine sodium) .Marland Kitchen.. 1 tab by mouth once daily five days a week and 1/2 tab by mouth 2 days a week 5)  Pravachol 20 Mg Tabs (Pravastatin sodium) .... Take one tablet at night 6)  Oxazepam 15 Mg Caps (Oxazepam) .Marland Kitchen.. 1-2  by mouth at bedtime as needed insomnia 7)  Calcium 600/vitamin D 600-400 Mg-unit Chew (Calcium carbonate-vitamin d) .... Two times a day 8)  B-12 250 Mcg Tabs (Cyanocobalamin) .Marland Kitchen.. 1-2 by mouth qd 9)  Ibuprofen 400 Mg Tabs (Ibuprofen) .... Two times a day as needed for pain 10)  Acetaminophen 500 Mg Tabs (Acetaminophen) .Marland Kitchen.. 1 as needed for pain - up to a total of 4 pills per day 11)  Fish Oil Oil (Fish oil) .... 2 pills taily - one in the am and one in the pm 12)  Biotin 300 Mcg Tabs (Biotin) .... One daily 13)  Folic Acid 400 Mcg Tabs (Folic acid) .... One daily 14)  Vicodin 5-500 Mg Tabs (Hydrocodone-acetaminophen) .Marland Kitchen.. 1-2 by mouth qday per pain clinic dr phillips 15)  Doxepin Hcl 10 Mg Caps (Doxepin hcl) .Marland Kitchen.. 1 by mouth once daily per pain clinic   Prevention & Chronic Care Immunizations   Influenza vaccine: Fluvax MCR  (09/08/2008)   Influenza vaccine due: 09/08/2009    Tetanus booster: 11/25/2004: given   Tetanus booster due: 11/25/2014    Pneumococcal vaccine: given  (11/25/2004)   Pneumococcal vaccine due: None    H. zoster vaccine: Not documented  Colorectal Screening   Hemoccult: Not documented   Hemoccult action/deferral: Not indicated  (06/06/2009)   Hemoccult due: Not Indicated    Colonoscopy: normal  (11/24/2006)   Colonoscopy due: 11/24/2016  Other Screening   Pap smear: Not documented   Pap smear action/deferral: Not indicated-other  (08/14/2010)   Pap smear  due: Not Indicated    Mammogram: ASSESSMENT: Negative - BI-RADS 1^MM DIGITAL SCREENING  (07/22/2010)   Mammogram due: 05/29/2010    DXA bone density scan: T score -2.1  (06/06/2009)   DXA scan due: 06/07/2011    Smoking status: never  (08/14/2010)  Lipids   Total Cholesterol: 177  (12/05/2009)   LDL: 89  (12/05/2009)   LDL Direct: 128  (07/26/2008)   HDL: 46  (12/05/2009)   Triglycerides: 210  (12/05/2009)   Lipid panel due: 11/14/2009    SGOT (AST): 19  (02/28/2009)   SGPT (ALT): 17  (02/28/2009) CMP ordered    Alkaline phosphatase: 65  (02/28/2009)   Total bilirubin: 0.3  (02/28/2009)   Liver panel due: 09/11/2009    Lipid flowsheet reviewed?: Yes   Progress toward LDL goal: At goal  Hypertension   Last Blood Pressure: 133 / 68  (08/14/2010)   Serum creatinine: 0.93  (02/28/2009)   Serum potassium 4.5  (02/28/2009) CMP ordered   Self-Management Support :   Personal Goals (by the next clinic visit) :      Personal blood pressure goal: 140/90  (08/15/2009)   Hypertension self-management support: Not documented    Lipid self-management support: Not documented

## 2010-12-24 NOTE — Progress Notes (Signed)
Summary: Dr Darrick Penna   Phone Note Call from Patient Call back at [phone]   Summary of Call: Pt is requesting to speak with Dr. Darrick Penna about a rheumotologist and some personal issues.  She states she has been emailing him and waiting for a respond. Initial call taken by: Haydee Salter,  August 30, 2007 9:19 AM         Appended Document: Dr Darrick Penna I emailed her.

## 2010-12-24 NOTE — Assessment & Plan Note (Signed)
Summary: f/up,tcb   Vital Signs:  Patient profile:   70 year old female Height:      61.5 inches Weight:      157.3 pounds BMI:     29.35 Temp:     97.6 degrees F oral Pulse rate:   73 / minute BP sitting:   111 / 70  (left arm) Cuff size:   regular  Vitals Entered By: Gladstone Pih (December 05, 2009 11:15 AM) CC: F/U Is Patient Diabetic? No Pain Assessment Patient in pain? no        CC:  F/U.  History of Present Illness: PAIN: now seeing Dr Vear Clock at Pain clinic. He would like [place] to recheck thyroid and B 12. he has started her on hydrocodone, stopped her cymbalta and amitryptilene.  He told her he wants to stop the soma but she is not ready yet. She sees hiom again first of Feb.  FLUSHING: havig cold feet and hands that occasionally turn pale--sometimes red, never blue--worse when outside in cold weather. Also intermittent facial flushing. Wonders if it is related to any of her medicines.  SHOULDER PAIN: saw Dr Ave Filter and he injected both shoulders and she had some relief.  FOOT PAIN: Seeing Dr Lajoyce Corners and thinks she needs surgery for her right foot. Pain w any extended standing or walking.  IRRITABLE BOWQEL: symptoms a little worse--constipation alternating w loose stoos. Is taking a fiber pill most days.  HTN: Follow up hypertension. Taking medicines regularly with no problems. Not having any any headaches or chest pains.   Habits & Providers  Alcohol-Tobacco-Diet     Tobacco Status: never  Current Medications (verified): 1)  Gabapentin 600 Mg Tabs (Gabapentin) .... 2 By Mouth At Bedtime and 1/2 Tab By Mouth Q Mid-Day 2)  Atenolol 50 Mg  Tabs (Atenolol) .... Once Daily 3)  Accupril 5 Mg  Tabs (Quinapril Hcl) .Marland Kitchen.. 1 By Mouth Once Daily 4)  Synthroid 125 Mcg  Tabs (Levothyroxine Sodium) .Marland Kitchen.. 1 Tab By Mouth Once Daily Five Days A Week and 1/2 Tab By Mouth 2 Days A Week 5)  Pravachol 20 Mg  Tabs (Pravastatin Sodium) .... Take One Tablet At Night 6)  Oxazepam 15 Mg  Caps (Oxazepam) .Marland Kitchen.. 1-2 By Mouth At Bedtime As Needed Insomnia 7)  Calcium 600/vitamin D 600-400 Mg-Unit Chew (Calcium Carbonate-Vitamin D) .... Two Times A Day 8)  B-12 250 Mcg Tabs (Cyanocobalamin) .Marland Kitchen.. 1-2 By Mouth Qd 9)  Ibuprofen 400 Mg Tabs (Ibuprofen) .... Two Times A Day As Needed For Pain 10)  Acetaminophen 500 Mg Tabs (Acetaminophen) .Marland Kitchen.. 1 As Needed For Pain - Up To A Total of 4 Pills Per Day 11)  Fish Oil  Oil (Fish Oil) .... 2 Pills Taily - One in The Am and One in The Pm 12)  Biotin 300 Mcg Tabs (Biotin) .... One Daily 13)  Folic Acid 400 Mcg Tabs (Folic Acid) .... One Daily 14)  Vicodin 5-500 Mg Tabs (Hydrocodone-Acetaminophen) .Marland Kitchen.. 1-2 By Mouth Qday Per Pain Clinic Dr Vear Clock 15)  Doxepin Hcl 10 Mg Caps (Doxepin Hcl) .Marland Kitchen.. 1 By Mouth Once Daily Per Pain Clinic  Allergies: 1)  ! Voltaren (Diclofenac Sodium) (Diclofenac Sodium) 2)  * Dilaudid 3)  * Fentanyl  Review of Systems  The patient denies anorexia, fever, weight loss, weight gain, chest pain, syncope, dyspnea on exertion, peripheral edema, prolonged cough, headaches, abdominal pain, and severe indigestion/heartburn.    Physical Exam  General:  alert and well-developed.   Neck:  supple, full ROM, and no masses.   Lungs:  normal respiratory effort.   Heart:  normal rate and regular rhythm.   Msk:  r foot is curving into c shape from loss of transvere arch, with splaying of toes and migration of first ray medially. not a lot ofr callous  r shoulder--AROm only to 90 degrees, then can passively move it into full overhead extension. normal radial pulse B Neurologic:  alert & oriented X3 and gait normal.   Psych:  Oriented X3, normally interactive, good eye contact, not anxious appearing, and not depressed appearing.     Impression & Recommendations:  Problem # 1:  NEUROPATHY, IDIOPATHIC PERIPHERAL NEC (ICD-356.8)  Orders: TSH-[facility] ([id number]) B12-[facility] ([id number]) [facility]- Est  Level 4 (53664) recheck these--pain  doctor requested we do this. last tsh was in May so it is almost time anyway. no synthroid change at this time.  Problem # 2:  CHRONIC PAIN SYNDROME (ICD-338.4)  Pain clinic has made a few changes-they will follow her narcotic meds  Orders: [facility]- Est  Level 4 (40347)  Problem # 3:  HYPERLIPIDEMIA (ICD-272.4)  Her updated medication list for this problem includes:    Pravachol 20 Mg Tabs (Pravastatin sodium) .Marland Kitchen... Take one tablet at night  Orders: Lipid-[facility] ([id number]) [facility]- Est  Level 4 (64332) has been back on pravochol now several months so we will get fLP  Problem # 4:  HYPERTENSION (ICD-401.9) Assessment: Unchanged  Her updated medication list for this problem includes:    Atenolol 50 Mg Tabs (Atenolol) ..... Once daily    Accupril 5 Mg Tabs (Quinapril hcl) .Marland Kitchen... 1 by mouth once daily continue current meds. good control rtc 3 m  Orders: [facility]- Est  Level 4 (99214)  Complete Medication List: 1)  Gabapentin 600 Mg Tabs (Gabapentin) .... 2 by mouth at bedtime and 1/2 tab by mouth q mid-day 2)  Atenolol 50 Mg Tabs (Atenolol) .... Once daily 3)  Accupril 5 Mg Tabs (Quinapril hcl) .Marland Kitchen.. 1 by mouth once daily 4)  Synthroid 125 Mcg Tabs (Levothyroxine sodium) .Marland Kitchen.. 1 tab by mouth once daily five days a week and 1/2 tab by mouth 2 days a week 5)  Pravachol 20 Mg Tabs (Pravastatin sodium) .... Take one tablet at night 6)  Oxazepam 15 Mg Caps (Oxazepam) .Marland Kitchen.. 1-2 by mouth at bedtime as needed insomnia 7)  Calcium 600/vitamin D 600-400 Mg-unit Chew (Calcium carbonate-vitamin d) .... Two times a day 8)  B-12 250 Mcg Tabs (Cyanocobalamin) .Marland Kitchen.. 1-2 by mouth qd 9)  Ibuprofen 400 Mg Tabs (Ibuprofen) .... Two times a day as needed for pain 10)  Acetaminophen 500 Mg Tabs (Acetaminophen) .Marland Kitchen.. 1 as needed for pain - up to a total of 4 pills per day 11)  Fish Oil Oil (Fish oil) .... 2 pills taily - one in the am and one in the pm 12)  Biotin 300 Mcg Tabs (Biotin) .... One daily 13)  Folic Acid  400 Mcg Tabs (Folic acid) .... One daily 14)  Vicodin 5-500 Mg Tabs (Hydrocodone-acetaminophen) .Marland Kitchen.. 1-2 by mouth qday per pain clinic dr phillips 15)  Doxepin Hcl 10 Mg Caps (Doxepin hcl) .Marland Kitchen.. 1 by mouth once daily per pain clinic  Prevention & Chronic Care Immunizations   Influenza vaccine: Fluvax MCR  (09/08/2008)   Influenza vaccine due: 09/08/2009    Tetanus booster: 11/25/2004: given   Tetanus booster due: 11/25/2014    Pneumococcal vaccine: given  (11/25/2004)   Pneumococcal vaccine due: None  H. zoster vaccine: Not documented  Colorectal Screening   Hemoccult: Not documented   Hemoccult action/deferral: Not indicated  (06/06/2009)   Hemoccult due: Not Indicated    Colonoscopy: normal  (11/24/2006)   Colonoscopy due: 11/24/2016  Other Screening   Pap smear: Not documented   Pap smear due: Not Indicated    Mammogram: ASSESSMENT: Negative - BI-RADS 1^MM DIGITAL SCREENING  (05/29/2009)   Mammogram due: 05/29/2010    DXA bone density scan: T score -2.1  (06/06/2009)   DXA scan due: 06/07/2011    Smoking status: never  (12/05/2009)  Lipids   Total Cholesterol: 167  (11/05/2007)   LDL: 79  (11/05/2007)   LDL Direct: 128  (07/26/2008)   HDL: 45  (11/05/2007)   Triglycerides: 197  (11/05/2007)   Lipid panel due: 11/14/2009    SGOT (AST): 19  (02/28/2009)   SGPT (ALT): 17  (02/28/2009)   Alkaline phosphatase: 65  (02/28/2009)   Total bilirubin: 0.3  (02/28/2009)   Liver panel due: 09/11/2009    Lipid flowsheet reviewed?: Yes   Progress toward LDL goal: Unchanged   Lipid comments: lipid panel today  Hypertension   Last Blood Pressure: 111 / 70  (12/05/2009)   Serum creatinine: 0.93  (02/28/2009)   Serum potassium 4.5  (02/28/2009)    Hypertension flowsheet reviewed?: Yes   Progress toward BP goal: At goal  Self-Management Support :   Personal Goals (by the next clinic visit) :      Personal blood pressure goal: 140/90  (08/15/2009)   Hypertension  self-management support: Not documented    Lipid self-management support: Not documented

## 2010-12-24 NOTE — Letter (Signed)
Summary: [name]  [address]   [place], [place] [address]   Phone: [phone]  Fax: [phone]    08/19/2010  [facility] [address] [place], [place]  [address]  Dear [name],  All of your labs including your blood sugar, liver and kidney function, thyroid and electrolytes were normal.         Sincerely,   Denny Levy MD  Appended Document: LABLetter mailed.

## 2010-12-24 NOTE — Letter (Signed)
Summary: [facility]   Imported By: Eather Colas PATE CMA, 05/01/2008 15:33:03  _____________________________________________________________________  External Attachment:    Type:   Image     Comment:   External Document

## 2010-12-24 NOTE — Miscellaneous (Signed)
Summary: RHEUMATOLOGY APPT   DR Chrissie Noa TRULOW APPT IS: WED NOVEMBER 5TH AT 11AM ARRIVE AT 10:45 TO FILL OUT PAPERWORK [address] NOTES FAXED TO [phone] OFFICE NUMBER IS [phone] PT MAILED INFO

## 2010-12-24 NOTE — Letter (Signed)
Summary: LAB Letter  [facility]  [address]   [place], [place] [address]   Phone: [phone]  Fax: [phone]    03/02/2009  [facility] [address] [place], [place]  [address]  Dear Ms. Amy,  Your blood sugar, electrolytes including your potassium and sodium, your kidney and liver function was all normal. Tyr hemoglobin was fine. Your red blood cell size was a little enlarged which makes me want to caution you again against drinking much alcohol.  Your thyroid test was borderline normal and I would like to see you back in 6 weeks or so to recheck that.  Your vitamin B 12 level was also borderline low and we can discuss that when i see you back as well.  Hope you are feeling better.  Please see me in about 6 weeks.         Sincerely,   Denny Levy MD [facility]  Appended Document: LAB Letter Mailed

## 2010-12-24 NOTE — Assessment & Plan Note (Signed)
Summary: to see MD/Page [name]  Nurse Visit   Impression & Recommendations:  Problem # 1:  HYPERLIPIDEMIA (ICD-272.4)  Her updated medication list for this problem includes:    Pravachol 20 Mg Tabs (Pravastatin sodium) ..... On hold  Orders: Direct LDL-[facility] [id number]) [facility]- Est Level  3 (26948)   Problem # 2:  NEUROPATHY, IDIOPATHIC PERIPHERAL NEC (ICD-356.8)  Orders: TSH-[facility] ([address]-03500) discussed upcoming surgery  Complete Medication List: 1)  Gabarone 300 Mg Tabs (Gabapentin) .... Taper up to 1 am/ 1 lunch /1 4pm and 3 by mouth at bedtime. 2)  Atenolol 50 Mg Tabs (Atenolol) .... Once daily 3)  Accupril 10 Mg Tabs (Quinapril hcl) .... Once daily 4)  Demadex 10 Mg Tabs (Torsemide) .Marland Kitchen.. 1-2 by mouth once daily 5)  Synthroid 125 Mcg Tabs (Levothyroxine sodium) .Marland Kitchen.. 1 tab by mouth once daily five days a week and 1/2 tab by mouth 2 days a week 6)  Pravachol 20 Mg Tabs (Pravastatin sodium) .... On hold 7)  Clonazepam 2 Mg Tabs (Clonazepam) .Marland Kitchen.. 1 by mouth at bedtime as needed insomnia; try to discontinue and do not use with tramodol 8)  Cymbalta 30 Mg Cpep (Duloxetine hcl) .... Once daily 9)  Tramadol Hcl 50 Mg Tabs (Tramadol hcl) .... Take 1 tablet by mouth  at bedtime if needed   Chief Complaint:  routine visit.  History of Present Illness: needs to discuss upcoming foot surgery--has some concerns is having tendon transfer at [facility] next week also wants to check her lipids--she has been off her meds now so we could get a true baseline. eating her normal diet  hx of abnl thyroid test per her and wants to dbl check that as she has been cold a lot and is getting ready to have surgery   Past Medical History:    Reviewed history from 11/05/2007 and no changes required:       goiter - multinodular - dr sevier       Hypertension       Osteoarthritis       Osteopenia       S/P laminectomy L4 for synovial cyst with neural impingement       Dr Hassan Buckler is  rheumatologist    Physical Exam  Neck:     supple, full ROM, and no masses.   Lungs:     normal respiratory effort, normal breath sounds, and no wheezes.   Heart:     normal rate, regular rhythm, and no murmur.   Abdomen:     soft and non-tender.     Vital Signs:  Patient Profile:   70 Years Old Female Height:     64 inches Weight:      162 pounds Temp:     97.5 degrees F Pulse rate:   67 / minute BP sitting:   112 / 52  Vitals Entered By: Jone Baseman CMA (December 31, 2007 1:38 PM)                 Prior Medications: GABARONE 300 MG  TABS (GABAPENTIN) taper up to 1 am/ 1 lunch /1 4pm and 3 by mouth at bedtime. ATENOLOL 50 MG  TABS (ATENOLOL) once daily ACCUPRIL 10 MG  TABS (QUINAPRIL HCL) once daily DEMADEX 10 MG  TABS (TORSEMIDE) 1-2 by mouth once daily SYNTHROID 125 MCG  TABS (LEVOTHYROXINE SODIUM) 1 tab by mouth once daily five days a week and 1/2 tab by mouth 2 days a week PRAVACHOL 20 MG  TABS (  PRAVASTATIN SODIUM) ON HOLD CLONAZEPAM 2 MG  TABS (CLONAZEPAM) 1 by mouth at bedtime as needed insomnia; try to discontinue and do not use with tramodol CYMBALTA 30 MG  CPEP (DULOXETINE HCL) once daily TRAMADOL HCL 50 MG TABS (TRAMADOL HCL) Take 1 tablet by mouth  at bedtime if needed Current Allergies: * DILAUDID * FENTANYL    Orders Added: 1)  TSH-[facility] [[id number]] 2)  Direct LDL-[facility] [[id number]] 3)  [facility]- Est Level  3 Elina.Jerry    ]

## 2010-12-24 NOTE — Consult Note (Signed)
Summary: [facility]  [facility]   Imported By: Denny Peon LEVAN 11/23/2007 10:04:29  _____________________________________________________________________  External Attachment:    Type:   Image     Comment:   External Document

## 2010-12-24 NOTE — Progress Notes (Signed)
Summary: Rx Req   Phone Note Refill Request Call back at [phone] Message from:  Patient  Refills Requested: Medication #1:  CYMBALTA 30 MG  CPEP 1 by mouth qd  Medication #2:  ACCUPRIL 5 MG  TABS 1 by mouth once daily USES [facility]  Initial call taken by: Clydell Hakim,  June 18, 2009 4:10 PM  Follow-up for Phone Call        will send  message to MD. Follow-up by: Theresia Lo RN,  June 18, 2009 4:16 PM    Prescriptions: CYMBALTA 30 MG  CPEP (DULOXETINE HCL) 1 by mouth qd  #90 x 3   Entered and Authorized by:   Denny Levy MD   Signed by:   Denny Levy MD on 06/19/2009   Method used:   Electronically to        [facility]* (retail)       [address] [place], [place]  [address]       Ph: [phone] or [phone]       Fax: [phone]   RxID:   [id number] ACCUPRIL 5 MG  TABS (QUINAPRIL HCL) 1 by mouth once daily  #90 x 3   Entered and Authorized by:   Denny Levy MD   Signed by:   Denny Levy MD on 06/19/2009   Method used:   Electronically to        [facility]* (retail)       [address] [place], [place]  [address]       Ph: [phone] or [phone]       Fax: [phone]   RxID:   [id number]

## 2010-12-24 NOTE — Assessment & Plan Note (Signed)
Summary: FU/KH   Vital Signs:  Patient profile:   70 year old female Height:      61.5 inches Weight:      157.25 pounds BMI:     29.34 Temp:     97.4 degrees F oral Pulse rate:   66 / minute Pulse rhythm:   regular BP sitting:   138 / 77  (left arm)  Vitals Entered By: Modesta Messing LPN (July 12, 2009 2:44 PM) CC: Medication review. Is Patient Diabetic? No Pain Assessment Patient in pain? yes     Location: right shoulder Intensity: 2 Type: aching Onset of pain  Chronic   CC:  Medication review..  History of Present Illness: f/u shoulder pain, med management  her orthopedist had started elavil for her shoulder pain--she had started some robaxin (had some around the house and decided to try it). [place] a new shoulder specialist [place] who maybe able to do shoulder scope for (suprascapular? )nerve lesion--has PNCV in 2 weeks and sees Dr Ave Filter earlySept.  Also having resurgence of toe tingling and has appt w [facility] previous [facility] at  [facility]--he did her original back surgery. Wants to discuss meds  Bp is doing well-no problems w meds. no chest pains opr headaches.   continues to have knee pain w more than 3 trips up stairs in a day but is no worse.  Insomnia is still a problem unless she takes the oxazepam. Saysshe cannot sleep at all withoutthat.  Mood seems stable.  Allergies: 1)  * Dilaudid 2)  * Fentanyl  Physical Exam  General:  alert, well-developed, well-nourished, and well-hydrated.   Msk:  right shoulder elevation to 90 degrees. cannot get above thissecondary to weakness but also has pain w elevation above about 60 degrees. Neurologic:  alert & oriented X3.   Psych:  Oriented X3, memory intact for recent and remote, normally interactive, good eye contact, not anxious appearing, and not depressed appearing.     Impression & Recommendations:  Problem # 1:  ROTATOR CUFF INJURY, RIGHT SHOULDER (ICD-726.10) Assessment Unchanged  long discussionre er meds.  Itis not ideal to be oncymalta aand elavil--she does seem to be having great signs relief. I am not crazy about her starting robaxin--we discussed at most using 1/2 tab in afternoon and 1 by mouth at bedtime as needed--but agreed no vicodin. She wantsto taper down her neurontin as she is not sure itis helping--will decrease to 900 mg but watch for increase in pain or parasthesias. She is very adamant about the oxazepam at night--ultimately I would like to get that off her lsit. make these med changes and rtc 3-4 weeks  Orders: [facility]- Est  Level 4 ([address])  Complete Medication List: 1)  Gabapentin 300 Mg Caps (Gabapentin) .... 4 by mouth qhs 2)  Atenolol 50 Mg Tabs (Atenolol) .... Once daily 3)  Accupril 5 Mg Tabs (Quinapril hcl) .Marland Kitchen.. 1 by mouth once daily 4)  Synthroid 125 Mcg Tabs (Levothyroxine sodium) .Marland Kitchen.. 1 tab by mouth once daily five days a week and 1/2 tab by mouth 2 days a week 5)  Pravachol 20 Mg Tabs (Pravastatin sodium) .... Take one tablet at night 6)  Oxazepam 15 Mg Caps (Oxazepam) .Marland Kitchen.. 1 by mouth at bedtime as needed insomnia (in place of clonazepam) 7)  Cymbalta 30 Mg Cpep (Duloxetine hcl) .Marland Kitchen.. 1 by mouth qd 8)  Calcium 600/vitamin D 600-400 Mg-unit Chew (Calcium carbonate-vitamin d) .... Two times a day 9)  Ranitidine Hcl 150 Mg Caps (  Ranitidine hcl) .Marland Kitchen.. 1 tab by mouth daily 10)  B-12 250 Mcg Tabs (Cyanocobalamin) .Marland Kitchen.. 1-2 by mouth qd 11)  Amitriptyline Hcl 10 Mg Tabs (Amitriptyline hcl) .... One each am, one at 4pm and two at bedtime. 12)  Ibuprofen 400 Mg Tabs (Ibuprofen) .... Two times a day as needed for pain 13)  Acetaminophen 500 Mg Tabs (Acetaminophen) .Marland Kitchen.. 1 as needed for pain - up to a total of 4 pills per day 14)  Fish Oil Oil (Fish oil) .... 2 pills taily - one in the am and one in the pm 15)  Biotin 300 Mcg Tabs (Biotin) .... One daily 16)  Folic Acid 400 Mcg Tabs (Folic acid) .... One daily 17)  Aspirin 81 Mg Tbec (Aspirin) .... One every 3 days  Prevention &  Chronic Care Immunizations   Influenza vaccine: Fluvax MCR  (09/08/2008)   Influenza vaccine due: 09/08/2009    Tetanus booster: 11/25/2004: given   Tetanus booster due: 11/25/2014    Pneumococcal vaccine: given  (11/25/2004)   Pneumococcal vaccine due: None    H. zoster vaccine: Not documented  Colorectal Screening   Hemoccult: Not documented   Hemoccult action/deferral: Not indicated  (06/06/2009)   Hemoccult due: Not Indicated    Colonoscopy: normal  (11/24/2006)   Colonoscopy due: 11/24/2016  Other Screening   Pap smear: Not documented   Pap smear due: Not Indicated    Mammogram: ASSESSMENT: Negative - BI-RADS 1^MM DIGITAL SCREENING  (05/29/2009)   Mammogram due: 05/29/2010    DXA bone density scan: T score -2.1  (06/06/2009)   DXA scan due: 06/07/2011    Smoking status: never  (06/06/2009)  Lipids   Total Cholesterol: 167  (11/05/2007)   LDL: 79  (11/05/2007)   LDL Direct: 128  (07/26/2008)   HDL: 45  (11/05/2007)   Triglycerides: 197  (11/05/2007)   Lipid panel due: 09/11/2009    SGOT (AST): 19  (02/28/2009)   SGPT (ALT): 17  (02/28/2009)   Alkaline phosphatase: 65  (02/28/2009)   Total bilirubin: 0.3  (02/28/2009)   Liver panel due: 09/11/2009    Lipid flowsheet reviewed?: Yes   Progress toward LDL goal: At goal  Hypertension   Last Blood Pressure: 138 / 77  (07/12/2009)   Serum creatinine: 0.93  (02/28/2009)   Serum potassium 4.5  (02/28/2009)    Hypertension flowsheet reviewed?: Yes   Progress toward BP goal: At goal  Self-Management Support :    Hypertension self-management support: Not documented    Lipid self-management support: Not documented    Appended Document: FU/KH Medications Added GABAPENTIN 300 MG CAPS (GABAPENTIN) 3 by mouth qhs AMITRIPTYLINE HCL 10 MG TABS (AMITRIPTYLINE HCL) 1 am and 2 qhs          Clinical Lists Changes  Medications: Changed medication from AMITRIPTYLINE HCL 10 MG TABS (AMITRIPTYLINE HCL) one  each AM, one at 4PM and two at bedtime. to AMITRIPTYLINE HCL 10 MG TABS (AMITRIPTYLINE HCL) 1 am and 2 qhs Changed medication from GABAPENTIN 300 MG CAPS (GABAPENTIN) 4 by mouth qhs to GABAPENTIN 300 MG CAPS (GABAPENTIN) 3 by mouth qhs

## 2010-12-24 NOTE — Letter (Signed)
Summary: LAB Letter  [facility]  [address]   [place], [place] [address]   Phone: [phone]  Fax: [phone]    12/07/2009  [facility] [address] [place], [place]  [address]  Dear Ms. Figge,  Total cholesterol is 177.:                     Triglyceride         [H]  210 mg/dL                   <644   HDL Cholesterol           46 mg/dL                    >03   Total Chol/HDL Ratio      3.8 Ratio    LDL Cholesterol (Calc)                             89 mg/dL                   < 474   This looks great! LDL is less than 100 where we want you and HDL is greater than 39. Thyroid and B 12 are normal as well. Values below. Tests: (2) TSH (23280)   TSH                       0.710 uIU/mL                0.350-4.500     ***Test methodology is 3rd generation TSH***  Tests: (3) Vitamin B12 (25956)   Vitamin B12          [H]  1474 pg/mL                  211-911         Sincerely,   Denny Levy MD   Appended Document: LAB Letter mailed

## 2010-12-24 NOTE — Assessment & Plan Note (Signed)
Summary: PINK EYE AND COLD/DLH   Vital Signs:  Patient Profile:   70 Years Old Female Height:     64 inches Weight:      161.5 pounds Temp:     97.7 degrees F Pulse rate:   76 / minute BP sitting:   113 / 53  (right arm)  Pt. in pain?   no  Vitals Entered By: Arlyss Repress CMA, (November 22, 2007 2:18 PM)              Is Patient Diabetic? No     Chief Complaint:  ?pink eye and sinus congestion and pressure x 5 days.  History of Present Illness: PINK EYE for last 2 days.  No sick contacts other than her dog who has chronic eye infection.  Eyes itch and burn with no significant change in vision or pain.  Has bilateral mattering.   Has URI with postnasal drip and cough symptoms.  No fever or shortness of breath or chest pain.    Current Allergies: * DILAUDID * FENTANYL      Physical Exam  General:     Well-developed,well-nourished,in no acute distress; alert,appropriate and cooperative throughout examination Eyes:     Peripheral conjunctival injection bilaterally.  PERRL EOMI Corneas clear.   Neck:     No deformities, masses, or tenderness noted. Lungs:     Normal respiratory effort, chest expands symmetrically. Lungs are clear to auscultation, no crackles or wheezes. Heart:     Normal rate and regular rhythm. S1 and S2 normal without gallop, murmur, click, rub or other extra sounds.    Impression & Recommendations:  Problem # 1:  CONJUNCTIVITIS (ICD-372.30) Could not find report of transmission from canines.  Will treat with erythromycin opthalmic ointment.  Use over the counter decongestant.  Call if not improving in a few days or if worsening with pain or visual changes Orders: [facility]- Est Level  3 ([address]52)   Complete Medication List: 1)  Gabarone 300 Mg Tabs (Gabapentin) .... Taper up to 1 am/ 1 lunch /1 4pm and 3 by mouth at bedtime. 2)  Atenolol 50 Mg Tabs (Atenolol) .... Once daily 3)  Accupril 10 Mg Tabs (Quinapril hcl) .... Once daily 4)  Demadex 10 Mg  Tabs (Torsemide) .Marland Kitchen.. 1-2 by mouth once daily 5)  Synthroid 125 Mcg Tabs (Levothyroxine sodium) .Marland Kitchen.. 1 tab by mouth once daily five days a week and 1/2 tab by mouth 2 days a week 6)  Pravachol 20 Mg Tabs (Pravastatin sodium) .... On hold 7)  Clonazepam 2 Mg Tabs (Clonazepam) .Marland Kitchen.. 1 by mouth at bedtime as needed insomnia; try to discontinue and do not use with tramodol 8)  Cymbalta 30 Mg Cpep (Duloxetine hcl) .... Once daily 9)  Tramadol Hcl 50 Mg Tabs (Tramadol hcl) .... Take 1 tablet by mouth  at bedtime if needed     ]

## 2010-12-24 NOTE — Consult Note (Signed)
Summary: [facility]   Imported By: Knox Royalty 12/19/2008 11:06:13  _____________________________________________________________________  External Attachment:    Type:   Image     Comment:   External Document

## 2010-12-24 NOTE — Progress Notes (Signed)
Summary: Requesting wi with PCP  Phone Note Call from Patient Call back at Home Phone [phone] Call back at [phone]   Reason for Call: Talk to Nurse Summary of Call: pt is requesting an appt with Dr. [name], wants to know if she can double book? Pt needs clearance for surgery Initial call taken by: ERIN LEVAN,  December 22, 2007 12:04 PM  Follow-up for Phone Call        LMOVM for pt to call back with details.  Type of surgery,  when it is etc. Follow-up by: Mohsin Crum CMA,  December 22, 2007 12:12 PM  Additional Follow-up for Phone Call Additional follow up Details #1::        pt returning call, sts she has a torn tenden and the surgery is on 2/16 Additional Follow-up by: ERIN LEVAN,  December 22, 2007 12:13 PM    Additional Follow-up for Phone Call Additional follow up Details #2::    OK to put her at END of my clinic next WED 2/4 thanks  ...................................................................Huntley Dec NEAL MD  December 23, 2007 9:58 AM   Additional Follow-up for Phone Call Additional follow up Details #3:: Details for Additional Follow-up Action Taken: will be at [facility] for pre op that am. any other day or time you can see her?    Additional Follow-up by: Golden Circle RN,  December 23, 2007 10:09 AM  Pt is checking status WHITNEY POOLE Feb. 2, 2009 11:30AM [facility] TEAM  see if she can do Friday the 6th at 1:30 pm. That is all I have to offer this week as I am on inpatient service all week  ...................................................................SARA NEAL MD  December 27, 2007 4:36 PM  Pt sts that is fine for her.  Do I put her on the Nurse list and have you paged? you are not in clinic that day. ...................................................................Cason Dabney CMA  December 27, 2007 5:09 PM Marti Acebo--just page me [phone] Thanks  ...................................................................Huntley Dec NEAL MD  December 28, 2007 4:16  PM Done ...................................................................Zoie Sarin CMA  December 28, 2007 4:34 PM

## 2010-12-24 NOTE — Assessment & Plan Note (Signed)
Summary: TO SEE Taaj Hurlbut AT 3:00P/KH   Vital Signs:  Patient Profile:   70 Years Old Female Height:     64 inches Weight:      165 pounds Temp:     97.5 degrees F Pulse rate:   73 / minute BP sitting:   106 / 64  (left arm)  Pt. in pain?   yes    Location:   arch of left foot    Intensity:   3    Type:       sore  Vitals Entered ByJacki Cones RN (November 05, 2007 3:10 PM)                  Chief Complaint:  f/u meds.  History of Present Illness: 1) we had increased her neurontin and at last visit her tramodol was increased. Since then she has felt "not herself"; has had several episodes of clumsy near-falls and one or two actual falls. She is not sure why she is falling but feels more awkward and clumsy since her back surgery and now with her foot problem (arch soreness---light weight bearing per her and wearing brace). Denies any presyncopal symptoms such as darkening vision, palpitations, SOB but does have some intermittent dizziness that may or may not precede episodes.  2) has lots of questions about her medicines. Had her lipid panel done whil taking 20 of pravastatin and it was; Total 167; LDL 79;  HDL 45 and tg 197. Her dr wanted to increase her pravastatin but she ownders if she really needs it  3) says her thought process has not been the same---really since her back surgery but worse since we started adjusting her neurontin and tramodol. Feels fuzzy--forgets things. Hasnot gotten lost. Reports she used to drink some amount of vodka daily until her husband 'stopped that" and she now drinks (one?) glass of wine at night.  4) had some wheezing once since I saw her last  5) R shoulder is hurting--previously had bone spur removed (Dr Renae Fickle). Is worried the spur is back.  6) previous doctor put her on cymbalta in recent months for some depressive sx--she is feeling some better. Still some emotional lability. denies suicidal or homicidal ideation. Very depressed that she is not  as active as she once was; depressed she cannot walk her dog due to foot ptoblems.  Hypertension History:      Positive major cardiovascular risk factors include female age 53 years old or older, hyperlipidemia, and hypertension.  Negative major cardiovascular risk factors include non-tobacco-user status.     Current Allergies: * DILAUDID * FENTANYL  Past Medical History:    Reviewed history from 01/28/2007 and no changes required:       goiter - multinodular - dr sevier       Hypertension       Osteoarthritis       Osteopenia       S/P laminectomy L4 for synovial cyst with neural impingement       Dr Hassan Buckler is rheumatologist  Past Surgical History:    Acromioplasty for spurs ; DJD in 2002 for both rt and lt shoulders    L5-S1 fusion 2008 Dr Lucilla Lame   Social History:    Married     retired [employer] fundraiser    question of history borderline hazardous alcohol intake   Risk Factors:  Alcohol use:  yes   Review of Systems       The patient complains of muscle weakness,  difficulty walking, and depression.  The patient denies vision loss, chest pain, and dyspnea on exhertion.     Physical Exam  General:     alert.   Eyes:     pupils equal, pupils round, and pupils reactive to light.   Neck:     supple, full ROM, and no masses.   Lungs:     Normal respiratory effort, chest expands symmetrically. Lungs are clear to auscultation, no crackles or wheezes. Heart:     Normal rate and regular rhythm. S1 and S2 normal without gallop, murmur, click, rub or other extra sounds. Abdomen:     soft and non-tender.   Msk:     decreased forward flexion and IR of right shoulder due to pain. hip flexors seem B = and intact strength but some instability rising from a chair and needs use of UE to help. Neurologic:     alert & oriented X3.   Gait is a little unsteady, slightly wide based. Also favors L foot as she has arch pain there.  speech is normal cadence and content but wandering in focus  at times. Redirects well but some tangential mature. Very occasional word finding difficulty.no dysarthria. Psych:     Oriented X3, memory intact for recent and remote, normally interactive, good eye contact, not anxious appearing, and not depressed appearing.      Impression & Recommendations:  Problem # 1:  ACCIDENTAL FALLS, RECURRENT (ICD-E888.9) Assessment: New I think multifactorial---certainly medication could be playing a part. will adjust as per instructions section. Second issue seems to be lower extremity weakness and gait abnormality. Will schedule PT.  Problem # 2:  HYPERLIPIDEMIA (ICD-272.4) Assessment: Unchanged  Her updated medication list for this problem includes:    Pravachol 20 Mg Tabs (Pravastatin sodium) ..... On hold she wants to try stopping med. She does not know her baseline lipid panel. will stop and recheck lipids 6-8 w Orders: [facility]- Est  Level 4 (99214)   Problem # 3:  ROTATOR CUFF INJURY, RIGHT SHOULDER (ICD-726.10) Assessment: Unchanged for now will do nothing except follow. We discussed adding this to her PT work list but she said last time PT made her worse. Will follow.  Problem # 4:  MENTAL CONFUSION (ICD-298.9) Assessment: New seems to be a little tangential---I am unsure of her exact baseline. Will decrease or stop meds as per instruction list. Will follow this closely and try to get some sense of what she feels is going on. May consider a more formal workup at some point if we discover this seems to be a definite decline over short period. (MRI, neuropsych). She is on a lot of meds that may be contributing so will address that avenue first. Orders: [facility]- Est  Level 4 ([address]109)   Complete Medication List: 1)  Gabarone 300 Mg Tabs (Gabapentin) .... Taper up to 1 am/ 1 lunch /1 4pm and 3 by mouth at bedtime. 2)  Atenolol 50 Mg Tabs (Atenolol) .... Once daily 3)  Accupril 10 Mg Tabs (Quinapril hcl) .... Once daily 4)  Demadex 10 Mg Tabs (Torsemide)  .Marland Kitchen.. 1-2 by mouth once daily 5)  Synthroid 125 Mcg Tabs (Levothyroxine sodium) .Marland Kitchen.. 1 tab by mouth once daily five days a week and 1/2 tab by mouth 2 days a week 6)  Pravachol 20 Mg Tabs (Pravastatin sodium) .... On hold 7)  Clonazepam 2 Mg Tabs (Clonazepam) .Marland Kitchen.. 1 by mouth at bedtime as needed insomnia; try to discontinue and do not use with  tramodol 8)  Cymbalta 30 Mg Cpep (Duloxetine hcl) .... Once daily 9)  Tramadol Hcl 50 Mg Tabs (Tramadol hcl) .... Take 1 tablet by mouth  at bedtime if needed  Hypertension Assessment/Plan:      The patient's hypertensive risk group is category B: At least one risk factor (excluding diabetes) with no target organ damage.  Her calculated 10 year risk of coronary heart disease is 5 %.  Today's blood pressure is 106/64.     Patient Instructions: 1)  go back to gabapentin dose: 300 mg am, 300 mg lunch, 300 mg 4 pm and 900 at bedtime. 2)  discontinue the tramodol  or use it only at night if needed for pain--we can try stopping the clonazepam at night and use tramodol IF you need it 3)  stop the pravastatin for now and we will check a fasting lipid profile in 2 or 3 months 4)  ibuprofen 400 mg as needed OR two times a day 5)  OK with current total dose of vitamin D (1200 units)    ]

## 2010-12-24 NOTE — Assessment & Plan Note (Signed)
Summary: routine office visit/  dpg   Vital Signs:  Patient Profile:   70 Years Old Female Height:     64 inches Weight:      161.1 pounds BMI:     27.75 Pulse rate:   66 / minute BP sitting:   119 / 60  (right arm)  Pt. in pain?   no  Vitals Entered By: Arlyss Repress CMA, (May 24, 2008 9:48 AM)              Is Patient Diabetic? No     Chief Complaint:  routine visit. f/up HTN and meds change. right shoulder rotator cuff tear.  History of Present Illness: had MRI of shoulder and brings report--total tear with retraction and atrophy of supraspinatus and infraspinatus--not surgical candidate. Went to a PT session and had terrible pain. Has lots of questions about whether she should go forward with PT or forget it and live with the pain or get a second opinion.  Follow up hypertension. Taking medicines regularly with no problems. We had dcd a diuretic.Not having any any headaches or chest pains.  back to walking regularly now that her ankle has healed--quite excited about that  wants to know when she should have a CPE.    Current Allergies: * DILAUDID * FENTANYL  Past Medical History:    goiter - multinodular - dr sevier    Hypertension    Osteoarthritis    Osteopenia    S/P laminectomy L4 for synovial cyst with neural impingement    Dr Hassan Buckler is rheumatologist    MRI R shoulder 6/09 shows total tear and atrophy w retraction of supraspinatus and infraspinatus (Dr Ranell Patrick)  Past Surgical History:    Reviewed history from 03/01/2008 and no changes required:       Acromioplasty for spurs ; DJD in 2002 for both rt and lt shoulders       L5-S1 fusion 2008 Dr Lucilla Lame       left ankle surgery--tendon transfer and achilles lengthening at [facility] 12/2007     Review of Systems  The patient denies fever, weight loss, weight gain, chest pain, syncope, dyspnea on exertion, peripheral edema, prolonged cough, abdominal pain, severe indigestion/heartburn, and depression.      Physical Exam  General:     alert and well-developed.   Neck:     supple, full ROM, and no masses.   Lungs:     normal respiratory effort and normal breath sounds.   Heart:     normal rate and regular rhythm.   Msk:     r shoulder weak in elevation and forward flexion, limited ROM inoverhead reach. distally neurovascularly intact    Impression & Recommendations:  Problem # 1:  ROTATOR CUFF INJURY, RIGHT SHOULDER (ICD-726.10) Assessment: Unchanged long discussion about options and future progression of shoulder problems. I think she should probably ask teh PT to be a littkle less aggressive but proceed w tx. agreed to give her very short term darvocette to use in first few weeks of therapy--plan is to use at night when her shoulder hurts worst.  Orders: [facility]- Est Level  3 (54098)   Problem # 2:  HYPERTENSION (ICD-401.9) Assessment: Improved  Her updated medication list for this problem includes:    Atenolol 50 Mg Tabs (Atenolol) ..... Once daily    Accupril 5 Mg Tabs (Quinapril hcl) .Marland Kitchen... 1 by mouth once daily doing great on current regiome rtc for cpe in a couple of months, check labs  and update preventive services then Orders: [facility]- Est Level  3 (98119)   Complete Medication List: 1)  Gabarone 300 Mg Tabs (Gabapentin) .... 3 by mouth qhs 2)  Atenolol 50 Mg Tabs (Atenolol) .... Once daily 3)  Accupril 5 Mg Tabs (Quinapril hcl) .Marland Kitchen.. 1 by mouth once daily 4)  Synthroid 125 Mcg Tabs (Levothyroxine sodium) .Marland Kitchen.. 1 tab by mouth once daily five days a week and 1/2 tab by mouth 2 days a week 5)  Pravachol 20 Mg Tabs (Pravastatin sodium) .... On hold 6)  Clonazepam 2 Mg Tabs (Clonazepam) .Marland Kitchen.. 1 by mouth at bedtime as needed insomnia; try to discontinue and do not use with tramodol 7)  Cymbalta 30 Mg Cpep (Duloxetine hcl) .Marland Kitchen.. 1 by mouth qd 8)  Tramadol Hcl 50 Mg Tabs (Tramadol hcl) .... Take 1 tablet by mouth  at bedtime if needed 9)  Methocarbamol 750 Mg Tabs (Methocarbamol)  .Marland Kitchen.. 1 by mouth up to q 8 prn 10)  Darvocet-n 100 100-650 Mg Tabs (Propoxyphene n-apap) .Marland Kitchen.. 1 by mouth at bedtime as needed pain    Prescriptions: DARVOCET-N 100 100-650 MG  TABS (PROPOXYPHENE N-APAP) 1 by mouth at bedtime as needed pain  #30 x 1   Entered and Authorized by:   Denny Levy MD   Signed by:   Denny Levy MD on 05/24/2008   Method used:   Print then Give to Patient   RxID:   [id number]  ]

## 2010-12-24 NOTE — Assessment & Plan Note (Signed)
Summary: FU/KH   Vital Signs:  Patient Profile:   70 Years Old Female Height:     64 inches Weight:      158 pounds Temp:     98.7 degrees F Pulse rate:   67 / minute BP sitting:   123 / 52  Vitals Entered By: Jone Baseman CMA (Apr 12, 2008 10:33 AM)                 Chief Complaint:  f/u.  History of Present Illness: BP has still running low--she stopped the diuretic completely. Feels a little fatigued or maybe a little light headed at times--would agree to  decreasing her bp meds further  also has increased her gabapentin back to 900 once daily because she was sleeping poorly---her right shoulder is really bothering her. The shot I gave her helped for a few days only. he rheumatologist gace her Dr Ranell Patrick' name for othropedist and she owuld like for [place] to go ahead and set that up  since she went up on gabapentin and pain is better, would like to try decreasing her cymbalta dose as she wants to minimize her meds if possible  foot is doing well s/p tendon transfer. out of cam walker boot- and has been relaased by her foor surgeon ([facility])  is due and going to set up her mammogram    Current Allergies: * DILAUDID * FENTANYL  Past Surgical History:    Reviewed history from 03/01/2008 and no changes required:       Acromioplasty for spurs ; DJD in 2002 for both rt and lt shoulders       L5-S1 fusion 2008 Dr Lucilla Lame       left ankle surgery--tendon transfer and achilles lengthening at [facility] 12/2007     Review of Systems  The patient denies anorexia, fever, weight loss, weight gain, chest pain, syncope, dyspnea on exhertion, peripheral edema, and abdominal pain.     Physical Exam  General:     alert and well-developed.   Eyes:     pupils equal and no injection.   Neck:     supple, full ROM, and no masses.  no bruts and no thyromegally Lungs:     normal respiratory effort, normal breath sounds, and no wheezes.   Heart:     normal rate, regular rhythm, and no  murmur.   Additional Exam:     R shoulder some crepitus--pain w abduction and overhead extension distally neurovascularly intact    Impression & Recommendations:  Problem # 1:  HYPERTENSION (ICD-401.9)  The following medications were removed from the medication list:    Demadex 10 Mg Tabs (Torsemide) ..... On hold  Her updated medication list for this problem includes:    Atenolol 50 Mg Tabs (Atenolol) ..... Once daily    Accupril 5 Mg Tabs (Quinapril hcl) .Marland Kitchen... 1 by mouth once daily will decrease accupril to 5 mg. stopped demadex Orders: [facility]- Est  Level 4 (99214)   Problem # 2:  NEUROPATHY, IDIOPATHIC PERIPHERAL NEC (ICD-356.8) agree w increase gabapentin back to 900 dose and we can decrease her cymbalta to 20 Orders: [facility]- Est  Level 4 (99214)   Problem # 3:  ROTATOR CUFF INJURY, RIGHT SHOULDER (ICD-726.10) Assessment: Deteriorated will refer her to ortho for further eval/tx Orders: [facility]- Est  Level 4 (95621)   Complete Medication List: 1)  Gabarone 300 Mg Tabs (Gabapentin) .... 3 by mouth qhs 2)  Atenolol 50 Mg Tabs (Atenolol) .... Once daily  3)  Accupril 5 Mg Tabs (Quinapril hcl) .Marland Kitchen.. 1 by mouth once daily 4)  Synthroid 125 Mcg Tabs (Levothyroxine sodium) .Marland Kitchen.. 1 tab by mouth once daily five days a week and 1/2 tab by mouth 2 days a week 5)  Pravachol 20 Mg Tabs (Pravastatin sodium) .... On hold 6)  Clonazepam 2 Mg Tabs (Clonazepam) .Marland Kitchen.. 1 by mouth at bedtime as needed insomnia; try to discontinue and do not use with tramodol 7)  Cymbalta 20 Mg Cpep (Duloxetine hcl) .Marland Kitchen.. 1 by mouth once daily 8)  Tramadol Hcl 50 Mg Tabs (Tramadol hcl) .... Take 1 tablet by mouth  at bedtime if needed 9)  Methocarbamol 750 Mg Tabs (Methocarbamol) .Marland Kitchen.. 1 by mouth up to q 8 prn   Patient Instructions: 1)  rtc 6-8 w f/u med changes   Prescriptions: CYMBALTA 20 MG  CPEP (DULOXETINE HCL) 1 by mouth once daily  #30 x 12   Entered and Authorized by:   Denny Levy MD   Signed by:   Denny Levy MD on 04/12/2008   Method used:   Electronically sent to ...       [facility]  [id number]       [address]       [place], [place]  [address]       Ph: [phone] or [phone]       Fax: [phone]   RxID:   [id number] ACCUPRIL 5 MG  TABS (QUINAPRIL HCL) 1 by mouth once daily  #30 x 12   Entered and Authorized by:   Denny Levy MD   Signed by:   Denny Levy MD on 04/12/2008   Method used:   Electronically sent to ...       [facility]  [address] [id number]       [address]       [place], [place]  [address]       Ph: [phone] or [phone]       Fax: [phone]   RxID:   [id number]  ]

## 2010-12-24 NOTE — Letter (Signed)
Summary: LAB Letter  [facility]  [address]   [place], [place] [address]   Phone: [phone]  Fax: [phone]    01/05/2008  [facility] [address] [place], [place]  [address]  Dear [name],     Your thyroid test was normal. Your LDL cholesterol was 169 which is a little high. We probably shoud consider going back on cholesterol meds. We can discuss when I see you back if you would like.  Hope your surgery goes well!      Sincerely,   Denny Levy MD [facility]  Appended Document: LAB Letter sent

## 2010-12-24 NOTE — Progress Notes (Signed)
  Medications Added BACTRIM DS 800-160 MG TABS (SULFAMETHOXAZOLE-TRIMETHOPRIM) 1 by mouth two times a day for 7 days            New/Updated Medications: BACTRIM DS 800-160 MG TABS (SULFAMETHOXAZOLE-TRIMETHOPRIM) 1 by mouth two times a day for 7 days   DEAR WHITE TEAM please call her and tell her the urine tst looks suspicious for UTI--I am calling in Bactrim to her pharmacy--per her allergy list that should be  ok let me know if questions Thanks!  Denny Levy MD  March 01, 2009 2:42 PM  Lm on vm re: suspicious for UTI and bactrim phoned in to pharmacy-advised to call with questions or concerns ..................Marland KitchenDelores Pate-Gaddy, CMA (AAMA) March 01, 2009 4:37 PM  Prescriptions: BACTRIM DS 800-160 MG TABS (SULFAMETHOXAZOLE-TRIMETHOPRIM) 1 by mouth two times a day for 7 days  #14 x 0   Entered and Authorized by:   Denny Levy MD   Signed by:   Denny Levy MD on 03/01/2009   Method used:   [facility] to        [facility]  [id number]       [address]       [place], [place]  [address]       Ph: [phone] or [phone]       Fax: [phone]   RxID:   [id number]   Appended Document:  patient notified.

## 2010-12-24 NOTE — Progress Notes (Signed)
Summary: Rx Req   Phone Note Refill Request Call back at Home Phone [phone] Message from:  Patient  Refills Requested: Medication #1:  AMITRIPTYLINE HCL 10 MG TABS 1 am and 2 qhs  Medication #2:  OXAZEPAM 15 MG CAPS 1 by mouth at bedtime as needed insomnia (in place of clonazepam) pt uses [facility].  Initial call taken by: Clydell Hakim,  August 10, 2009 11:11 AM  Follow-up for Phone Call        will send message to MD. Follow-up by: Theresia Lo RN,  August 10, 2009 11:17 AM  Additional Follow-up for Phone Call Additional follow up Details #1::        DEAR WHITE TEAM can you call in the oxazepam. I sent the amitryptilne electronically. Thanks!  Denny Levy MD  August 10, 2009 12:55 PM     Prescriptions: AMITRIPTYLINE HCL 10 MG TABS (AMITRIPTYLINE HCL) 1 am and 2 qhs  #90 x 5   Entered and Authorized by:   Denny Levy MD   Signed by:   Denny Levy MD on 08/10/2009   Method used:   Electronically to        [facility]* (retail)       [address] [place], [place]  [address]       Ph: [phone] or [phone]       Fax: [phone]   RxID:   [id number] OXAZEPAM 15 MG CAPS (OXAZEPAM) 1 by mouth at bedtime as needed insomnia (in place of clonazepam)  #30 x 5   Entered and Authorized by:   Denny Levy MD   Signed by:   Denny Levy MD on 08/10/2009   Method used:   Telephoned to ...       [facility]* (retail)       [address] [place], [place]  [address]       Ph: [phone] or [phone]       Fax: [phone]   RxID:   [id number]  Pharmacist given verbal order.Marland KitchenMarland KitchenAlphia Kava  August 10, 2009 3:36 PM

## 2010-12-24 NOTE — Consult Note (Signed)
Summary: [facility]  [facility]   Imported By: De Nurse 03/06/2009 15:27:24  _____________________________________________________________________  External Attachment:    Type:   Image     Comment:   External Document

## 2010-12-24 NOTE — Progress Notes (Signed)
Summary: Email address  Phone Note Call from Patient Call back at Home Phone [phone]   Summary of Call: Pt is requesting Dr. Donnetta Hail email address. Initial call taken by: Haydee Salter,  February 17, 2008 11:39 AM  Follow-up for Phone Call        will forward to Dr neal Follow-up by: Alphia Kava,  February 17, 2008 11:47 AM  Additional Follow-up for Phone Call Additional follow up Details #1::        DEAR RED TEAM:  tell her for reasons of confidentiality and documentation, I do not use email for patient issues. I like to have pt call and leave message--that way it gets put in the EMR--there is then a record. Plus I am MUCh better about keeping up with the EMR than I am with  email. I may go days and not check email. So it is best for her to use coinic phone. Thanks  ...................................................................SARA NEAL MD  February 17, 2008 1:20 PM     Additional Follow-up for Phone Call Additional follow up Details #2::    Pt understands, but she wanted to make sure that Dr.Neal was still going to be her MD.  She sts "she heard there were going to be some changes over here and some people were moving on"  She just wants to make sure Dr.Neal will still be her MD.  Algis Downs I would send a message back to MD Follow-up by: Jone Baseman CMA,  February 17, 2008 2:41 PM  Additional Follow-up for Phone Call Additional follow up Details #3:: Details for Additional Follow-up Action Taken: I am not going anywhere Thanks  ...................................................................SARA NEAL MD  February 18, 2008 3:07 PM   LMOVM informing pt. of above....................................................................Kollen Armenti LPN  February 18, 2008 4:48 PM

## 2010-12-24 NOTE — Assessment & Plan Note (Signed)
Summary: yearly physical wp   Vital Signs:  Patient Profile:   70 Years Old Female Height:     64 inches Weight:      162 pounds Temp:     97.8 degrees F Pulse rate:   67 / minute BP sitting:   105 / 60  (left arm)  Vitals Entered By: Alphia Kava (July 26, 2008 10:11 AM)                TD Result Date:  11/25/2004 TD Result:  given Pneumovax Result Date:  11/25/2004 Pneumovax Result:  given Flex Sig Next Due:  Not Indicated Colonoscopy Result Date:  11/24/2006 Colonoscopy Result:  normal Hemoccult Next Due:  Not Indicated PAP Next Due:  Not Indicated Bone Density Result Date:  11/24/2002 Bone Density Result:  abnormal   Chief Complaint:  CPE.  History of Present Illness: here for cpe wants to discuss upcoming shoulder surgery by Dr Carney Living in [place].  she tried her husbamnd oxazepam and it worked better for her insomnia--wants to switch  feeling pretty well except for the shoulder pain Follow up hypertension. Taking medicines regularly with no problems. Not having any any headaches or chest pains.  Follow-up hyperlipidemia. Trying to follow a good diet, taking medicines regularly. Not having any problems with medicines, no myalgias and no fatigue.     Current Allergies: * DILAUDID * FENTANYL  Past Medical History:    Reviewed history from 05/24/2008 and no changes required:       goiter - multinodular - dr sevier       Hypertension       Osteoarthritis--multiple--back, knees, shoulder              Osteopenia--bone density in ?2004--was on fosamax but at request of her son (dentist?) who was worried about osteonecrosis of jawbone) she stopped fosamax--was on it 27 y              2007 dx seronegative arthritis       Dr Hassan Buckler is rheumatologist              MRI R shoulder 6/09 shows total tear and atrophy w retraction of supraspinatus and infraspinatus (Dr Norris)--planned surgery for arthroscopic clean out by Dr Carney Living 2009  Past Surgical History:  Reviewed history from 03/01/2008 and no changes required:       1986 hysterectomy BSO                2002 Acromioplasty for spurs ; DJD in for both rt and lt shoulders       2007 large synovial cyst removed from spine       2008 L5-S1 fusion Dr Harlen Labs       1986 left ankle surgery--tendon transfer and achilles lengthening at [facility] 2/200              1986 appendectomy        1998 tib fib fx        2004 toe surgery for hammer toe       2005 knee scope left medial meniscus tear    Risk Factors:  Colonoscopy History:     Date of Last Colonoscopy:  11/24/2006   Colonoscopy History:     Date of Last Colonoscopy:  11/24/2006    Results:  normal    Review of Systems  The patient denies anorexia, fever, weight loss, weight gain, hoarseness, chest pain, syncope, dyspnea on exertion, peripheral edema, prolonged cough, headaches, hemoptysis, abdominal  pain, melena, hematochezia, severe indigestion/heartburn, incontinence, muscle weakness, difficulty walking, and depression.     Physical Exam  General:     alert and well-developed.   Eyes:     pupils equal and pupils round.   Ears:     R ear normal and L ear normal.   Nose:     no external deformity.   Mouth:     good dentition and pharynx pink and moist.   Neck:     supple, full ROM, and no masses.  no bruirts Breasts:     no masses, symmetrical, no skin changes, axilla benign Lungs:     normal respiratory effort, normal breath sounds, and no wheezes.   Heart:     normal rate, regular rhythm, and no murmur.   Abdomen:     soft, non-tender, normal bowel sounds, and no masses.   Genitalia:     deferred by pt Msk:     r arm no abduction beyond 90 degrees Extremities:     no edema Neurologic:     alert & oriented X3.  nor42mal muscle strength except right arm (as above), normal gait and balance Skin:     turgor normal, color normal, no rashes, and no suspicious lesions.   Psych:     Oriented X3, normally interactive,  good eye contact, not anxious appearing, and not depressed appearing.      Impression & Recommendations:  Problem # 1:  Preventive Health Care (ICD-V70.0) breast exam  Problem # 2:  HYPERLIPIDEMIA (ICD-272.4)  Her updated medication list for this problem includes:    Pravachol 20 Mg Tabs (Pravastatin sodium) .Marland Kitchen... Take one tablet at night  Orders: Direct LDL-[facility] [phone]) [facility] - Est  65+ [phone]) restarted her back on statin so will check ldl today  Complete Medication List: 1)  Gabarone 300 Mg Tabs (Gabapentin) .... 3 by mouth qhs 2)  Atenolol 50 Mg Tabs (Atenolol) .... Once daily 3)  Accupril 5 Mg Tabs (Quinapril hcl) .Marland Kitchen.. 1 by mouth once daily 4)  Synthroid 125 Mcg Tabs (Levothyroxine sodium) .Marland Kitchen.. 1 tab by mouth once daily five days a week and 1/2 tab by mouth 2 days a week 5)  Pravachol 20 Mg Tabs (Pravastatin sodium) .... Take one tablet at night 6)  Oxazepam 15 Mg Caps (Oxazepam) .Marland Kitchen.. 1 by mouth at bedtime as needed insomnia (in place of clonazepam) 7)  Cymbalta 30 Mg Cpep (Duloxetine hcl) .Marland Kitchen.. 1 by mouth qd 8)  Methocarbamol 750 Mg Tabs (Methocarbamol) .Marland Kitchen.. 1 by mouth up to q 8 prn 9)  Calcium 600/vitamin D 600-400 Mg-unit Chew (Calcium carbonate-vitamin d) .... Two times a day  Other Orders: Comp Met-[facility] [id number]) TSH-[facility] [id number])   Patient Instructions: 1)  we have stopped your clonazepam and I am giving you a prescription for oxazepam instead for insomnia. 2)   Be sure to throw away any remaining clonazepam as you should NOT take both. 3)  I will send you a note about your lab results. I should probably see you back in 3-4 months--we can talk then about when to repeat your bone density test.   Prescriptions: OXAZEPAM 15 MG CAPS (OXAZEPAM) 1 by mouth at bedtime as needed insomnia (in place of clonazepam)  #30 x 5   Entered and Authorized by:   Denny Levy MD   Signed by:   Denny Levy MD on 07/26/2008   Method used:   Print then Give to Patient   RxID:    [id number]  ]

## 2010-12-24 NOTE — Assessment & Plan Note (Signed)
Summary: intractable nausea despite meds   Vital Signs:  Patient profile:   70 year old female Height:      61.5 inches Weight:      158.4 pounds BMI:     29.55 Temp:     97.8 degrees F Pulse rate:   70 / minute BP sitting:   142 / 67  (left arm)  Vitals Entered By: Theresia Lo RN (February 20, 2009 3:06 PM) CC: N/V Is Patient Diabetic? No Pain Assessment Patient in pain? no        History of Present Illness: sudden onset of n/v and diarrhea last Thurs.  Was so bad vomited in bed.  went to [facility] ED and was given 2L if NS, Reglan, Phenergan, and Zofran.  Since then she has not vomited but has constant nausea.  Able to keep small meals down.  Now w/ formed stools but says has mucous.  Worried that she has hepatitis A b/c she had this years ago after eating seafood.  has headache and dizzy feeling.  has zofran but not working nausea.  also w/ gassy, burping feeling that omeprazole not helping.    Of note, review of ED records comments that husband felt that patient's consumption of alcohol was contributing to the problem.  Na was 129.  WBC 15  Current Medications (verified): 1)  Gabapentin 300 Mg Caps (Gabapentin) .... 4 By Mouth Qhs 2)  Atenolol 50 Mg  Tabs (Atenolol) .... Once Daily 3)  Accupril 5 Mg  Tabs (Quinapril Hcl) .Marland Kitchen.. 1 By Mouth Once Daily 4)  Synthroid 125 Mcg  Tabs (Levothyroxine Sodium) .Marland Kitchen.. 1 Tab By Mouth Once Daily Five Days A Week and 1/2 Tab By Mouth 2 Days A Week 5)  Pravachol 20 Mg  Tabs (Pravastatin Sodium) .... Take One Tablet At Night 6)  Oxazepam 15 Mg Caps (Oxazepam) .Marland Kitchen.. 1 By Mouth At Bedtime As Needed Insomnia (In Place of Clonazepam) 7)  Cymbalta 30 Mg  Cpep (Duloxetine Hcl) .Marland Kitchen.. 1 By Mouth Qd 8)  Calcium 600/vitamin D 600-400 Mg-Unit Chew (Calcium Carbonate-Vitamin D) .... Two Times A Day 9)  Proctofoam Hc 1-1 % Foam (Hydrocortisone Ace-Pramoxine) .Marland Kitchen.. 1 Applicator Per Rectum Bod Prn 10)  Promethazine Hcl 25 Mg Tabs (Promethazine Hcl) .... 1/2-1 Tablet  By Mouth Q 6 Hrs As Needed Nausea 11)  Ranitidine Hcl 150 Mg Caps (Ranitidine Hcl) .Marland Kitchen.. 1 Tab By Mouth Daily  Allergies: 1)  * Dilaudid 2)  * Fentanyl  Review of Systems      See HPI  Physical Exam  General:  alert, well-developed, well-nourished, and well-hydrated.   Abdomen:  Bowel sounds positive,abdomen soft and non-tender without masses, organomegaly or hernias noted. Extremities:  brisk cap refill Skin:  turgor normal.     Impression & Recommendations:  Problem # 1:  NAUSEA AND VOMITING (ICD-787.01) Assessment New Most likely viral gastroenteritis, resolving.  Likely Norovirus.  D/c Zofran and start Phenergan.  CMET to assess liver function.  Given no jaundice, unlikely hepatitis A.  Even if had hep A, treatment is supportive so testing not indicated as would not change managment.  I do have concern that alcohol is  playing a role in patient's nausea.  Will make PCP aware of situation.  Low Na found at ED is indicative of chronic alcohol consumptoin.  Ranitidine for heartburn symptoms Orders: Comp Met-[facility] ([id number]) [facility]- Est Level  3 (56213)  Complete Medication List: 1)  Gabapentin 300 Mg Caps (Gabapentin) .... 4 by mouth qhs 2)  Atenolol 50 Mg Tabs (Atenolol) .... Once daily 3)  Accupril 5 Mg Tabs (Quinapril hcl) .Marland Kitchen.. 1 by mouth once daily 4)  Synthroid 125 Mcg Tabs (Levothyroxine sodium) .Marland Kitchen.. 1 tab by mouth once daily five days a week and 1/2 tab by mouth 2 days a week 5)  Pravachol 20 Mg Tabs (Pravastatin sodium) .... Take one tablet at night 6)  Oxazepam 15 Mg Caps (Oxazepam) .Marland Kitchen.. 1 by mouth at bedtime as needed insomnia (in place of clonazepam) 7)  Cymbalta 30 Mg Cpep (Duloxetine hcl) .Marland Kitchen.. 1 by mouth qd 8)  Calcium 600/vitamin D 600-400 Mg-unit Chew (Calcium carbonate-vitamin d) .... Two times a day 9)  Proctofoam Hc 1-1 % Foam (Hydrocortisone ace-pramoxine) .Marland Kitchen.. 1 applicator per rectum bod prn 10)  Promethazine Hcl 25 Mg Tabs (Promethazine hcl) .... 1/2-1 tablet  by mouth q 6 hrs as needed nausea 11)  Ranitidine Hcl 150 Mg Caps (Ranitidine hcl) .Marland Kitchen.. 1 tab by mouth daily  Patient Instructions: 1)  Please schedule a follow-up appointment as needed .  2)  STOP ZOFRAN 3)  START PROMETHAZINE (PHENERGAN) 1/2 TO 1 TABLET EVERY 6 HOURS AS NEEDED FOR NAUSEA 4)  START RANITIDINE(ZANTAC) DAILY TO HELP W/ BURPING, INDIGESTION 5)  If you labs are abnormal, someone from office will call you.  If they are normal, you will receive a letter.   Prescriptions: RANITIDINE HCL 150 MG CAPS (RANITIDINE HCL) 1 tab by mouth daily  #30 x 1   Entered and Authorized by:   Johney Maine MD   Signed by:   Johney Maine MD on 02/20/2009   Method used:   Electronically to        [facility]  [address] [id number] (retail)       [address]       [place], [place]  [address]       Ph: [phone] or [phone]       Fax: [phone]   RxID:   [id number] PROMETHAZINE HCL 25 MG TABS (PROMETHAZINE HCL) 1/2-1 tablet by mouth q 6 hrs as needed nausea  #30 x 0   Entered and Authorized by:   Johney Maine MD   Signed by:   Johney Maine MD on 02/20/2009   Method used:   Electronically to        [facility]  [address] [id number] (retail)       [address]       [place], [place]  [address]       Ph: [phone] or [phone]       Fax: [phone]   RxID:   [id number]

## 2010-12-24 NOTE — Miscellaneous (Signed)
  Medications Added GABAPENTIN 300 MG CAPS (GABAPENTIN) 4 by mouth qhs       Clinical Lists Changes  Medications: Changed medication from GABARONE 300 MG  TABS (GABAPENTIN) 3 by mouth qhs to GABAPENTIN 300 MG CAPS (GABAPENTIN) 4 by mouth qhs - Signed Removed medication of METHOCARBAMOL 750 MG  TABS (METHOCARBAMOL) 1 by mouth up to q 8 prn - Signed

## 2010-12-24 NOTE — Assessment & Plan Note (Signed)
Summary: FU/KH   Vital Signs:  Patient profile:   70 year old female Weight:      157 pounds Temp:     98.4 degrees F oral Pulse rate:   60 / minute BP sitting:   118 / 70  (left arm)  Vitals Entered By: Alphia Kava (August 15, 2009 10:58 AM) CC: f/u, Hypertension Management Is Patient Diabetic? No   CC:  f/u and Hypertension Management.  History of Present Illness: f/u change in gabapentin. she really had pain in her joints--wrists and shoulder, when she cut back. she now wants to go up as she thinks it is really helping her. Saw ortho--they only thing they have to offer is a total shoulder replacement which she is not ready to do. Wants to discuss m,eds again. She wants to keep the vicodin and the robaxin.  she is regularly using two oxazepam at night to help her sleep.  says she can barely get to sleep even using this and does not want to change it. She uses this and ibuprofen  to help her get to sleep in addition to other meds.without it she says she may be awake half the night  is doing more walking, has walked 2 miles several times on level ground. excited about that.  things going well at home, [place]  Hypertension History:      Positive major cardiovascular risk factors include female age 38 years old or older, hyperlipidemia, and hypertension.  Negative major cardiovascular risk factors include non-tobacco-user status.    Habits & Providers  Alcohol-Tobacco-Diet     Tobacco Status: never  Current Medications (verified): 1)  Gabapentin 300 Mg Caps (Gabapentin) .... 3 By Mouth Qhs 2)  Atenolol 50 Mg  Tabs (Atenolol) .... Once Daily 3)  Accupril 5 Mg  Tabs (Quinapril Hcl) .Marland Kitchen.. 1 By Mouth Once Daily 4)  Synthroid 125 Mcg  Tabs (Levothyroxine Sodium) .Marland Kitchen.. 1 Tab By Mouth Once Daily Five Days A Week and 1/2 Tab By Mouth 2 Days A Week 5)  Pravachol 20 Mg  Tabs (Pravastatin Sodium) .... Take One Tablet At  Night 6)  Oxazepam 15 Mg Caps (Oxazepam) .Marland Kitchen.. 1 By Mouth At Bedtime As Needed Insomnia (In Place of Clonazepam) 7)  Cymbalta 30 Mg  Cpep (Duloxetine Hcl) .Marland Kitchen.. 1 By Mouth Qd 8)  Calcium 600/vitamin D 600-400 Mg-Unit Chew (Calcium Carbonate-Vitamin D) .... Two Times A Day 9)  Ranitidine Hcl 150 Mg Caps (Ranitidine Hcl) .Marland Kitchen.. 1 Tab By Mouth Daily 10)  B-12 250 Mcg Tabs (Cyanocobalamin) .Marland Kitchen.. 1-2 By Mouth Qd 11)  Amitriptyline Hcl 10 Mg Tabs (Amitriptyline Hcl) .Marland Kitchen.. 1 Am and 2 Qhs 12)  Ibuprofen 400 Mg Tabs (Ibuprofen) .... Two Times A Day As Needed For Pain 13)  Acetaminophen 500 Mg Tabs (Acetaminophen) .Marland Kitchen.. 1 As Needed For Pain - Up To A Total of 4 Pills Per Day 14)  Fish Oil  Oil (Fish Oil) .... 2 Pills Taily - One in The Am and One in The Pm 15)  Biotin 300 Mcg Tabs (Biotin) .... One Daily 16)  Folic Acid 400 Mcg Tabs (Folic Acid) .... One Daily 17)  Aspirin 81 Mg Tbec (Aspirin) .... One Every 3 Days  Allergies: 1)  * Dilaudid 2)  * Fentanyl  Physical Exam  General:  alert and well-developed.   Lungs:  normal respiratory effort.   Heart:  normal rate, regular rhythm, and no murmur.  Extremities:  no edema B LE. No edema hands. normal gait. arises from chair easily. can raise right arm above shoulder only w use of her left arm Neurologic:  alert & oriented X3.   Psych:  Oriented X3, good eye contact, not anxious appearing, and not depressed appearing.     Impression & Recommendations:  Problem # 1:  ROTATOR CUFF INJURY, RIGHT SHOULDER (ICD-726.10) Assessment Unchanged  I would like to stay off the robaxin and on as low a dose as possible of the visodin. we discussed at length, spending greater than 50% of our 35 minute ov in counseling and education re medication use  Orders: [facility]- Est  Level 4 (56213)  Problem # 2:  INSOMNIA (ICD-780.52)  for now we will continue current regimen she seems to be actually doing very well right now--walkin up to 2 miles now on  occasion.  Orders: [facility]- Est  Level 4 ([id number])  Problem # 3:  MALAISE AND FATIGUE (ICD-780.79) Assessment: Improved  seems to be doing more, walking more.  Orders: [facility]- Est  Level 4 (84696)  Complete Medication List: 1)  Gabapentin 600 Mg Tabs (Gabapentin) .... 2 by mouth at bedtime and 1/2 tab by mouth q mid-day 2)  Atenolol 50 Mg Tabs (Atenolol) .... Once daily 3)  Accupril 5 Mg Tabs (Quinapril hcl) .Marland Kitchen.. 1 by mouth once daily 4)  Synthroid 125 Mcg Tabs (Levothyroxine sodium) .Marland Kitchen.. 1 tab by mouth once daily five days a week and 1/2 tab by mouth 2 days a week 5)  Pravachol 20 Mg Tabs (Pravastatin sodium) .... Take one tablet at night 6)  Oxazepam 15 Mg Caps (Oxazepam) .Marland Kitchen.. 1-2 by mouth at bedtime as needed insomnia 7)  Cymbalta 30 Mg Cpep (Duloxetine hcl) .Marland Kitchen.. 1 by mouth qd 8)  Calcium 600/vitamin D 600-400 Mg-unit Chew (Calcium carbonate-vitamin d) .... Two times a day 9)  Ranitidine Hcl 150 Mg Caps (Ranitidine hcl) .Marland Kitchen.. 1 tab by mouth daily 10)  B-12 250 Mcg Tabs (Cyanocobalamin) .Marland Kitchen.. 1-2 by mouth qd 11)  Amitriptyline Hcl 10 Mg Tabs (Amitriptyline hcl) .Marland Kitchen.. 1 am and 2 qhs 12)  Ibuprofen 400 Mg Tabs (Ibuprofen) .... Two times a day as needed for pain 13)  Acetaminophen 500 Mg Tabs (Acetaminophen) .Marland Kitchen.. 1 as needed for pain - up to a total of 4 pills per day 14)  Fish Oil Oil (Fish oil) .... 2 pills taily - one in the am and one in the pm 15)  Biotin 300 Mcg Tabs (Biotin) .... One daily 16)  Folic Acid 400 Mcg Tabs (Folic acid) .... One daily 17)  Aspirin 81 Mg Tbec (Aspirin) .... One every 3 days 18)  Vicodin 5-500 Mg Tabs (Hydrocodone-acetaminophen) .Marland Kitchen.. 1-2 by mouth qday as needed knee pain  Hypertension Assessment/Plan:      The patient's hypertensive risk group is category B: At least one risk factor (excluding diabetes) with no target organ damage.  Her calculated 10 year risk of coronary heart disease is 5 %.  Today's blood pressure is 118/70.     Patient Instructions: 1)   We are increasing your gabapentin and essentially stopping the metha carbamol. I know you still have a few left and it is ok to use those in an emergency, but I think it is best to get off this medicine. 2)  I am going to continue the elavil dose at 1 in am and 2 at night. I will also continue the ibuprofen. we will give you only a few  vicodin as I do not think you will need this very often. 3)  I am also continuing the oxazepam although I would like eventually to taper this back or off. 4)  Drema Halon tto see you! 5)  Please see me back in 2-3 months. Prescriptions: VICODIN 5-500 MG TABS (HYDROCODONE-ACETAMINOPHEN) 1-2 by mouth qday as needed knee pain  #60 x 1   Entered and Authorized by:   Denny Levy MD   Signed by:   Denny Levy MD on 08/15/2009   Method used:   Print then Give to Patient   RxID:   [id number] GABAPENTIN 600 MG TABS (GABAPENTIN) 2 by mouth at bedtime and 1/2 tab by mouth q mid-day  #225 x 3   Entered and Authorized by:   Denny Levy MD   Signed by:   Denny Levy MD on 08/15/2009   Method used:   Print then Give to Patient   RxID:   [id number] AMITRIPTYLINE HCL 10 MG TABS (AMITRIPTYLINE HCL) 1 am and 2 qhs  #270 x 3   Entered and Authorized by:   Denny Levy MD   Signed by:   Denny Levy MD on 08/15/2009   Method used:   Print then Give to Patient   RxID:   [id number] OXAZEPAM 15 MG CAPS (OXAZEPAM) 1-2 by mouth at bedtime as needed insomnia  #180 x 3   Entered and Authorized by:   Denny Levy MD   Signed by:   Denny Levy MD on 08/15/2009   Method used:   Print then Give to Patient   RxID:   [id number]   Prevention & Chronic Care Immunizations   Influenza vaccine: Fluvax MCR  (09/08/2008)   Influenza vaccine due: 09/08/2009    Tetanus booster: 11/25/2004: given   Tetanus booster due: 11/25/2014    Pneumococcal vaccine: given  (11/25/2004)   Pneumococcal vaccine due: None    H. zoster vaccine: Not documented  Colorectal Screening   Hemoccult: Not  documented   Hemoccult action/deferral: Not indicated  (06/06/2009)   Hemoccult due: Not Indicated    Colonoscopy: normal  (11/24/2006)   Colonoscopy due: 11/24/2016  Other Screening   Pap smear: Not documented   Pap smear due: Not Indicated    Mammogram: ASSESSMENT: Negative - BI-RADS 1^MM DIGITAL SCREENING  (05/29/2009)   Mammogram due: 05/29/2010    DXA bone density scan: T score -2.1  (06/06/2009)   DXA scan due: 06/07/2011    Smoking status: never  (08/15/2009)  Lipids   Total Cholesterol: 167  (11/05/2007)   LDL: 79  (11/05/2007)   LDL Direct: 128  (07/26/2008)   HDL: 45  (11/05/2007)   Triglycerides: 197  (11/05/2007)   Lipid panel due: 11/14/2009    SGOT (AST): 19  (02/28/2009)   SGPT (ALT): 17  (02/28/2009)   Alkaline phosphatase: 65  (02/28/2009)   Total bilirubin: 0.3  (02/28/2009)   Liver panel due: 09/11/2009    Lipid flowsheet reviewed?: Yes   Progress toward LDL goal: Unchanged  Hypertension   Last Blood Pressure: 118 / 70  (08/15/2009)   Serum creatinine: 0.93  (02/28/2009)   Serum potassium 4.5  (02/28/2009)    Hypertension flowsheet reviewed?: Yes   Progress toward BP goal: At goal  Self-Management Support :   Personal Goals (by the next clinic visit) :      Personal blood pressure goal: 140/90  (08/15/2009)   Hypertension self-management support: Not documented    Lipid self-management support: Not documented

## 2010-12-24 NOTE — Assessment & Plan Note (Signed)
Summary: fu/kh   Vital Signs:  Patient Profile:   70 Years Old Female Weight:      158 pounds Pulse rate:   68 / minute BP sitting:   125 / 60  Pt. in pain?   yes    Location:   left foot    Intensity:   3  Vitals Entered By: Arlyss Repress CMA, (March 04, 2007 10:37 AM)              Is Patient Diabetic? No   Procedure Note  Injections: Duration of symptoms: 2 months Indication: acute pain  Procedure # 1: joint injection    Region: posterior    Location: right shoulder    Technique: 25 gauge needle    Medication: 40 mg kenalog    Anesthesia: 0.5% marcaine    Comment: Injection done easily after spraying with ethly chloridee;  patient tolerated procedure well .  total of 1 cc kenalog and 6 ccs of marcaine injected.  Cleaned and prepped with: betadine Wound dressing: band-aid Additional Instructions: easy ROM and RC exercises given   Chief Complaint:  f/up left foot pain/right shoulder pain.  History of Present Illness: Patient with multiple MS complaints;  retired [employer] fundraiser;  has had bilat surgery on shoulders;  also surgery on left foot to ?change toe position  Now rt shoulder painful enough to cause pain up into neck and stop her from doing any activity with rt arm.  ON this side she ahd an acromioplasty by dr Renae Fickle in 2002 and was found to have significant before meals arthritis  Lt foot painful enough to inhibit walking.  also this limp causes lt side back pain.  note back pain on rt is much better with no radiation since starting neurontin 300 usually bid    Past Surgical History:    Acromioplasty for spurs and before meals DJD in 2002 for both rt and lt shoulders     Review of Systems  The patient denies fever and weight loss.         no symmetrical joint swelling or morning stiffness   Physical Exam  General:     alert and overweight-appearing.   Head:     normocephalic.   Msk:     Inspection reveals forward role of both shoulders Note painful  arc on rt at 90 degreees and also at 130 deg mild weakness on ext rotation but ?> secondary to pain slight weakness on abduction/ elevation and in impingement positions nl reflexes neg drop arm neck motion limted but seems secondary to lt trapezius spasm neuro check nl Extremities:     feet reveal surgical changes to left foot that shorten 4tha nd 5th toes she has nearly complete breakdown at midfoot left that allows marked pronation at this joint and navicular drop rt foot does not show the same breakdown    Impression & Recommendations:  Problem # 1:  ROTATOR CUFF INJURY, RIGHT SHOULDER (ICD-726.10) Assessment: Deteriorated See injection note she has and understands series of exercises I would like to reck adn possibley reinject in 6 weeks Orders: [facility]- Est  Level 4 (99214) Injection, large joint- [facility] (20610)   Problem # 2:  FOOT PAIN, LEFT (ICD-729.5) Assessment: New pain has become severe and she had tried OTC insoles but with only brief relief placed in comforthotic insoles with arch pad under left had immediately relief on walking and correction of lt pronation Orders: Metatarsal pads- [facility] (Z6109) [facility]- Est  Level 4 (60454)  Problem # 3:  NEURALGIA (ICD-729.2) Assessment: Improved will continue neurontin and gradually increase dose to 600 two times a day to tid Orders: [facility]- Est  Level 4 (99214)   Problem # 4:  BACK PAIN (ICD-724.5) Assessment: Improved use darvocet as she stomach pain with NSAIDs Orders: [facility]- Est  Level 4 (04540)

## 2010-12-24 NOTE — Assessment & Plan Note (Signed)
Summary: swollen foot/see triage note/eo   Vital Signs:  Patient profile:   70 year old female Height:      61.5 inches Weight:      166.2 pounds BMI:     31.01 Temp:     97.9 degrees F oral Pulse rate:   66 / minute BP sitting:   132 / 63  (left arm) Cuff size:   regular  Vitals Entered By: Dedra Skeens CMA, (May 17, 2009 10:48 AM) CC: swollen feet Is Patient Diabetic? No Pain Assessment Patient in pain? yes     Location: feet Intensity: 4   CC:  swollen feet.  History of Present Illness: has had swollen R foot since vacation to beach in May.  No known injury.  swelling over fore foot.  is tender.  worse in mornings. best at night.  has been red on occasion.  toes turn blue at times.  R leg has had saphenous vein removed in past.  Sees Dr Luiz Blare w/ Ortho but  never for foot problem.   also states she is wheezing today  Habits & Providers  Alcohol-Tobacco-Diet     Tobacco Status: never  Allergies: 1)  * Dilaudid 2)  * Fentanyl  Past History:  Past medical history reviewed for relevance to current acute and chronic problems.  Past Medical History: Reviewed history from 10/11/2008 and no changes required. goiter - multinodular - dr sevier Hypertension Osteoarthritis--multiple--back, knees, shoulder  Osteopenia--bone density in ?2004--was on fosamax but at request of her son (dentist?) who was worried about osteonecrosis of jawbone) she stopped fosamax--was on it 16 y  2007 dx seronegative arthritis Dr Hassan Buckler is rheumatologist  MRI R shoulder 6/09 shows total tear and atrophy w retraction of supraspinatus and infraspinatus (Dr Ranell Patrick)-- surgery for arthroscopic clean out by Dr Carney Living 2009 [place], [place] [phone])  PT at [facility] PT Art Schullenberger.  Physical Exam  General:  alert, well-developed, well-nourished, and well-hydrated.   Lungs:  normal breath sounds.   no wheeze  no increased work of breathing  Heart:  normal rate and regular rhythm.    Pulses:  2+ DP pulses equal bilateral  Extremities:  L foot: wnl R foot:  mild swelling fore foot and lateral malleolus.  mild tender to palpation over dorsal aspect of foot.  Toes w/ mild blue discoloration after hanging off table during visit.  Reslove w/ elevation     Impression & Recommendations:  Problem # 1:  SWELLING OF LIMB (ICD-729.81) Assessment New likely due to past hx of removal of saphenous vein.  No signs of DVT.  There is some discoloration of foot but this is likely positional.  However, will refer for ABIs to assure of adequate circulation.  Will not start lasix as patient w/ hx of alcohol abuse and could cause electrolyte abnormalties.  Defer to PCP.   Orders: [facility]- Est Level  3 (09811)  Problem # 2:  wheeze none  present.    Complete Medication List: 1)  Gabapentin 300 Mg Caps (Gabapentin) .... 4 by mouth qhs 2)  Atenolol 50 Mg Tabs (Atenolol) .... Once daily 3)  Accupril 5 Mg Tabs (Quinapril hcl) .Marland Kitchen.. 1 by mouth once daily 4)  Synthroid 125 Mcg Tabs (Levothyroxine sodium) .Marland Kitchen.. 1 tab by mouth once daily five days a week and 1/2 tab by mouth 2 days a week 5)  Pravachol 20 Mg Tabs (Pravastatin sodium) .... Take one tablet at night 6)  Oxazepam 15 Mg Caps (Oxazepam) .Marland Kitchen.. 1 by mouth  at bedtime as needed insomnia (in place of clonazepam) 7)  Cymbalta 30 Mg Cpep (Duloxetine hcl) .Marland Kitchen.. 1 by mouth qd 8)  Calcium 600/vitamin D 600-400 Mg-unit Chew (Calcium carbonate-vitamin d) .... Two times a day 9)  Proctofoam Hc 1-1 % Foam (Hydrocortisone ace-pramoxine) .Marland Kitchen.. 1 applicator per rectum bod prn 10)  Ranitidine Hcl 150 Mg Caps (Ranitidine hcl) .Marland Kitchen.. 1 tab by mouth daily 11)  B-12 250 Mcg Tabs (Cyanocobalamin) .Marland Kitchen.. 1-2 by mouth qd  Patient Instructions: 1)  Please schedule for ABIs in Pharm Clinic 2)  Keep your appointment with Dr [name] 3)  Keep your feet elevated at night.

## 2010-12-24 NOTE — Progress Notes (Signed)
Summary: Referral to Dr. Kellie Simmering   Phone Note Call from Patient Call back at [facility] Phone [phone]   Summary of Call: Pt is needing a referral to Dr. Kellie Simmering for arthritis.  Pt tried to make an appt, but they told her we needed to send a referral. Initial call taken by: Haydee Salter,  September 01, 2007 3:58 PM

## 2010-12-24 NOTE — Consult Note (Signed)
Summary: [facility]   Imported By: De Nurse 02/07/2009 15:56:40  _____________________________________________________________________  External Attachment:    Type:   Image     Comment:   External Document

## 2010-12-24 NOTE — Progress Notes (Signed)
Summary: Rx Req   Phone Note Refill Request Call back at Home Phone [phone] Message from:  Patient  Refills Requested: Medication #1:  PRAVACHOL 20 MG  TABS take one tablet at night [facility].  Initial call taken by: Clydell Hakim,  May 07, 2010 10:52 AM    Prescriptions: PRAVACHOL 20 MG  TABS (PRAVASTATIN SODIUM) take one tablet at night  #90 x 3   Entered and Authorized by:   Denny Levy MD   Signed by:   Denny Levy MD on 05/07/2010   Method used:   Electronically to        [facility]* (retail)       [address] [place], [place]  [address]       Ph: [phone] or [phone]       Fax: [phone]   RxID:   [id number]

## 2010-12-24 NOTE — Letter (Signed)
Summary: Clearance for Surgery  [facility]  [address]   [place], [place] [address]   Phone: [phone]  Fax: [phone]    04/09/2010 [facility] FAX: [phone] ATTN: Destiny [name]   RE: Destiny [name] [address] [place], [place]  [address]  Dear Dr Ave Filter,  [name] is medically cleared for rotator cuff surgery.           Sincerely,   Denny Levy MD  Appended Document: Clearance for Surgery Faxed.

## 2010-12-24 NOTE — Assessment & Plan Note (Signed)
Summary: Destiny [name] AT 11:30/KH  Medications Added GABARONE 300 MG  TABS (GABAPENTIN) taper up Destiny 1 am/ 1 lunch /1 4pm and 3 by mouth at bedtime. ATENOLOL 50 MG  TABS (ATENOLOL) once daily ACCUPRIL 10 MG  TABS (QUINAPRIL HCL) once daily DEMADEX 10 MG  TABS (TORSEMIDE) 1-2 by mouth once daily SYNTHROID 125 MCG  TABS (LEVOTHYROXINE SODIUM) 1 tab by mouth once daily five days a week and 1/2 tab by mouth 2 days a week PRAVACHOL 20 MG  TABS (PRAVASTATIN SODIUM) 1 by mouth once daily CLONAZEPAM 2 MG  TABS (CLONAZEPAM) 1 by mouth at bedtime as needed insomnia CYMBALTA 30 MG  CPEP (DULOXETINE HCL) once daily      Allergies Added: * DILAUDID * FENTANYL  Vital Signs:  Patient Profile:   70 Years Old Female Height:     64 inches Weight:      163.4 pounds BMI:     28.15 Temp:     98.0 degrees F Pulse rate:   66 / minute BP sitting:   112 / 67  (left arm)  Pt. in pain?   yes    Location:   l foot and joints in hands    Intensity:   5  Vitals Entered By: Destiny [name], (September 16, 2007 11:58 AM)                  Chief Complaint:  Destiny see Destiny [name].  History of Present Illness: pt with multiple areas of arthritis and joint pain here for review of options for foot/toe pain taht is bilateral, worsening, initially started > 24 m ago. Burning, hypersensitive and numbness in all toes. Cannot tolerate bed covers at night due Destiny hyper sensitivity. Has had Destiny relegate most of her shoes Destiny closet and currently wearing some extra wide tennis shoes w OTC orthotic. Left arch on plantar surface is also very painful where she had twisting injury several weeks ago. No swelling and no skin lesions on feet. No similar sx in her hands or elsewhere. Has had multiple orthjopedic procedures on feet and low back, most recently a lumbar fusion several months ago.  Current Allergies: * DILAUDID * FENTANYL  Past Medical History:    Reviewed history from 01/28/2007 and no changes required:       goiter -  multinodular - Destiny [name]       Hypertension       Osteoarthritis       Osteopenia       S/P laminectomy L4 for synovial cyst with neural impingement  Past Surgical History:    Acromioplasty for spurs and before meals DJD in 2002 for both rt and lt shoulders    L5-S1 fusion 2008 Destiny [name]   Family History:    "family history" of arthritis  Social History:    Married     retired [employer] fundraiser    Review of Systems  The patient denies anorexia, weight loss, chest pain, syncope, and dyspnea on exhertion.     Physical Exam  Msk:     Feet B have loss of transverse arch, no skin lesions. Normal color and nl cap refill. Well healed scar left dorsum 4-5 interspace. Pulses normal DP B.  Ankles have FROM and are stable. Some decrease in sift touch sensation mid foot distal. surgical webbing of 4th and 5th toes interspace. mild Destiny moderate pes planus B. Neurologic:     alert & oriented X3, strength normal in all extremities, and gait  normal.      Impression & Recommendations:  Problem # 1:  NEUROPATHY, IDIOPATHIC PERIPHERAL NEC (ICD-356.8) Assessment: Deteriorated long discussion re options for tx of her apparent peripheral neuropathy not related Destiny diabetes or known metabolic disorder.reviewed her meds. We opted Destiny increase her neurontin over next 6 weeks Destiny a final dose of: see med list. 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 Plan consolidation of doses once we reach adequate dose; also consider custom molded orthotics at some point. Orders: Destiny [name]- Est Level  3 (28413)   Complete Medication List: 1)  Gabarone 300 Mg Tabs (Gabapentin) .... Taper up Destiny 1 am/ 1 lunch /1 4pm and 3 by mouth at bedtime. 2)  Atenolol 50 Mg Tabs (Atenolol) .... Once daily 3)  Accupril 10 Mg Tabs (Quinapril hcl) .... Once daily 4)  Demadex 10 Mg Tabs (Torsemide) .Marland Kitchen.. 1-2 by mouth once daily 5)   Synthroid 125 Mcg Tabs (Levothyroxine sodium) .Marland Kitchen.. 1 tab by mouth once daily five days a week and 1/2 tab by mouth 2 days a week 6)  Pravachol 20 Mg Tabs (Pravastatin sodium) .Marland Kitchen.. 1 by mouth once daily 7)  Clonazepam 2 Mg Tabs (Clonazepam) .Marland Kitchen.. 1 by mouth at bedtime as needed insomnia 8)  Cymbalta 30 Mg Cpep (Duloxetine hcl) .... Once daily     ]

## 2010-12-24 NOTE — Progress Notes (Signed)
Summary: Rx Req   Phone Note Refill Request Call back at Home Phone [phone] Message from:  Patient  Refills Requested: Medication #1:  ATENOLOL 50 MG  TABS once daily PT USES [facility] AND WOULD LIKE A 3 MONTH SUPPLY.  PLEASE CALL PT AND LET HER KNOW THIS HAS BEEN DONE.  VERY UPSET BECAUSE SHE SAYS SHE HAS A PROBLEM GETTING THINGS DONE HERE.  Initial call taken by: Clydell Hakim,  December 20, 2009 9:44 AM  Follow-up for Phone Call         patient notified that rx has been sent. she voiced concern that she always has problems getting her refills sent in in a timely manner. she stated the pharmacy has faxed [place] refill request for a couple of days but they had  not heard form [place]. checked in Dr. Donnetta Hail box  and there is no fax in her box currently. suggested to patient if she could ask her pharmacy to electronically send [place] the refill request this may be  better. we had a discussion about how our office operates and gave her  Archie Patten, Lisa's names  on the white team with Dr. [name] and also gave her my name to ask for if problem again. Follow-up by: Theresia Lo RN,  December 20, 2009 10:19 AM

## 2010-12-24 NOTE — Progress Notes (Signed)
Summary: Triage   Phone Note Call from Patient Call back at Home Phone [phone]   Caller: Patient Summary of Call: pts foot is swollen and has white streak in it since yesterday.   Initial call taken by: Clydell Hakim,  May 16, 2009 11:42 AM  Follow-up for Phone Call        R foot swollen badly. worse in am. L swollen to some degree. has a white streak coming from toe to ankle.  started when she was at beach. home had many stairs. denies injury. to elevate feet, avoid salt. appt made for tomorrow at 11 with Dr. Karn Pickler Follow-up by: Golden Circle RN,  May 16, 2009 11:53 AM

## 2010-12-24 NOTE — Progress Notes (Signed)
  Phone Note From Other Clinic   Caller: [facility] Summary of Call: Requesting to speak with Dr [name] about [name].  Advises no eval was done on pt due to ankle injury- until pt see [name]. Amy can be contacted at [phone] Initial call taken by: Dedra Skeens CMA,,  December 23, 2007 8:17 AM  Follow-up for Phone Call        left mssg for Amy  ...................................................................Daxton Nydam MD  December 23, 2007 10:00 AM   Additional Follow-up for Phone Call Additional follow up Details #1::        spoke w Amy pt will need re authorization 9request) for PT for gait after her foot and ankle surgery

## 2010-12-24 NOTE — Assessment & Plan Note (Signed)
Summary: f/u,df   Vital Signs:  Patient profile:   70 year old female Height:      61.5 inches Weight:      163 pounds BMI:     30.41 Temp:     97.6 degrees F oral Pulse rate:   66 / minute BP sitting:   104 / 66  (left arm)  Vitals Entered By: Alphia Kava (Apr 04, 2009 8:38 AM)  CC: f/u Is Patient Diabetic? No   History of Present Illness: f/u abnormal thyroid test. She reports she has been on thyroid meds "for years" because of a small goiter.  insomnia still an issue: says many nights she plays computer games unti, 2 am and then gets up at 9 am. She never feels particularly rested but there is no chnage over last several years.  Says she is eating better and not drinking very much alcohol at all.  s/p supartz injections in her left knee and those have been mosdetly helpful--she is still worried she may need TKR sometime in near future  Re her shoulder pain (right) she is seeing Dr Regino Schultze and he has done 2 nerve blocks11first one seemed to help some, she is not sure about the second one.  Follow up hypertension. Taking medicines regularly with no problems. Not having any any headaches or chest pains.   Habits & Providers     Tobacco Status: never  Allergies: 1)  * Dilaudid 2)  * Fentanyl  Review of Systems  The patient denies anorexia, fever, weight loss, weight gain, hoarseness, headaches, abdominal pain, severe indigestion/heartburn, muscle weakness, and depression.    Physical Exam  General:  alert, well-developed, well-nourished, and well-hydrated.   Eyes:  EOMI conjunctiva non icteric Neck:  supple and full ROM.  no thyromegally and no thyroid nodules noted, no thuroid bruits Lungs:  normal breath sounds.   Heart:  normal rate and regular rhythm.     Impression & Recommendations:  Problem # 1:  UNSPECIFIED HYPOTHYROIDISM (ICD-244.9)  Her updated medication list for this problem includes:    Synthroid 125 Mcg Tabs (Levothyroxine sodium) .Marland Kitchen... 1 tab by  mouth once daily five days a week and 1/2 tab by mouth 2 days a week  Orders: TSH-[facility] ([id number]) Free T3-[facility] [phone]) Free T4-[facility] ([id number]) [facility]- Est  Level 4 (28413) she would like to continue suppression for her previously diagnosed thyroid nodule. will check T4 and get a bone densitytest--she has decided (in past) to discontinue bisphosphonates due to her sone concern about osteonecrosis of jaw. Will try to keep her TSH 0.1-0.5 to lesses effects on bone.  Problem # 2:  OSTEOPENIA (ICD-733.90) Assessment: Unchanged  Her updated medication list for this problem includes:    Calcium 600/vitamin D 600-400 Mg-unit Chew (Calcium carbonate-vitamin d) .Marland Kitchen..Marland Kitchen Two times a day  Orders: Dexa scan (Dexa scan) re emphasized calcium  Problem # 3:  OTHER VITAMIN B12 DEFICIENCY ANEMIA (ICD-281.1)  Her updated medication list for this problem includes:    B-12 250 Mcg Tabs (Cyanocobalamin) .Marland Kitchen... 1-2 by mouth qd she is  a little low in her B 12  and her indices were a little off on CBC--again recommended no alcohol--and we will give her IM injection today and start oral replacement--plan to recheck in 3 months or so.  Orders: [facility]- Est  Level 4 (24401)  Complete Medication List: 1)  Gabapentin 300 Mg Caps (Gabapentin) .... 4 by mouth qhs 2)  Atenolol 50 Mg Tabs (Atenolol) .... Once daily 3)  Accupril 5 Mg Tabs (Quinapril hcl) .Marland Kitchen.. 1 by mouth once daily 4)  Synthroid 125 Mcg Tabs (Levothyroxine sodium) .Marland Kitchen.. 1 tab by mouth once daily five days a week and 1/2 tab by mouth 2 days a week 5)  Pravachol 20 Mg Tabs (Pravastatin sodium) .... Take one tablet at night 6)  Oxazepam 15 Mg Caps (Oxazepam) .Marland Kitchen.. 1 by mouth at bedtime as needed insomnia (in place of clonazepam) 7)  Cymbalta 30 Mg Cpep (Duloxetine hcl) .Marland Kitchen.. 1 by mouth qd 8)  Calcium 600/vitamin D 600-400 Mg-unit Chew (Calcium carbonate-vitamin d) .... Two times a day 9)  Proctofoam Hc 1-1 % Foam (Hydrocortisone ace-pramoxine) .Marland Kitchen.. 1  applicator per rectum bod prn 10)  Ranitidine Hcl 150 Mg Caps (Ranitidine hcl) .Marland Kitchen.. 1 tab by mouth daily 11)  B-12 250 Mcg Tabs (Cyanocobalamin) .Marland Kitchen.. 1-2 by mouth qd  Patient Instructions: 1)  I am checking your thyroid today and will send you a note about that. 2)  For vitamin B 12 replacement I would get either a 250 or 500 mg tab overthe counter and take a total of 500 mg a day. 3)  Vitamin B 12 is also called cyanocobalamin.

## 2010-12-24 NOTE — Assessment & Plan Note (Signed)
Summary: FU/KH   Vital Signs:  Patient Profile:   70 Years Old Female Weight:      158 pounds Pulse rate:   71 / minute BP sitting:   100 / 54  (left arm)  Pt. in pain?   yes    Location:   feet    Intensity:   3  Vitals Entered By: Arlyss Repress CMA, (Apr 08, 2007 10:19 AM)              Is Patient Diabetic? No   Chief Complaint:  f/up foot pain and back pain 4/10.  History of Present Illness: Destiny [name] returns for f/u of 3 MS problems;  sees Dr Oneta Rack for routine care.  RT shoulder: Injection on last vist and had marked improvement in pain.  Rot Cuff sxs but no sign of tear on exam althouogh she is high risk.  now with motion exercises has improved.  till some pain over upper Rt scap region  LT foot surgery has left lateral left foot with little support.  had metatarsalgia and hammertoes.  MT pads did improve this but do not fit shoes well. birkenstocks help  LBP and neuropathy Did not seem same as previous lubar cyst that was removed. e xam more consistent with DJD of lubar region with secondary nerver root impingement.  trial on neurontin and this helps.  now up to 300 am and 600 PM.  definitley helps but sometimes makes her sleepy. ultram and darvocet are not much better than tylenol so she is on tylenol now.  better but would like to increase neurontin dose.        Physical Exam  General:     alert and overweight-appearing.  NAD Head:     normocephalic.  wear glasses Msk:     Rt shoulder has nearly normal ROM compared to left today - much better strength is good in all motions and about 80% of Lt No drop arm or sign of RC tear  LBack beter extension and gait good strength  no weakness on normal walking or in lower extremes  Metatarsal and Feet callousing under left 2nd hammertoe at pip on lt 2nd hammertoe in DIP on rt  Trial with MT pad on left loosening upper of shoe good pain relief with this    Impression & Recommendations:  Problem # 1:   FOOT PAIN, LEFT (ICD-729.5) Assessment: Unchanged Trial metatarsal pads try inserts into shoes with cloth upper and high vol toe box Orders: [facility]- Est Level  3 ([address])   Problem # 2:  NEURALGIA (ICD-729.2) Assessment: Improved with this and LBP I think it is fine to increase neurontin to 600 two times a day and even to three times a day until sxs controlled try to return to walking and back routine when less painful Orders: [facility]- Est Level  3 (99213)   Problem # 3:  ROTATOR CUFF INJURY, RIGHT SHOULDER (ICD-726.10) Assessment: Improved keep ROM exercises up Orders: [facility]- Est Level  3 ([address]454)

## 2010-12-24 NOTE — Letter (Signed)
Summary: LABc Letter  [facility]  [address]   [place], [place] [address]   Phone: [phone]  Fax: [phone]    07/28/2008  [facility] [address] [place], [place]  [address]  Dear Ms. Shuford,  Your thyroid, blood sugar, liver functions, kidney functions, electrolytes were all normal. Your LDL cholesterol is back at our goal of less than 130 with a value of 128. looks good!         Sincerely,   Denny Levy MD [facility]  Appended Document: LABc Letter sent

## 2010-12-24 NOTE — Consult Note (Signed)
Summary: [facility]  [facility]   Imported By: Haydee Salter 01/08/2009 10:47:30  _____________________________________________________________________  External Attachment:    Type:   Image     Comment:   External Document

## 2010-12-24 NOTE — Progress Notes (Signed)
Summary: Double book   Phone Note Call from Patient Call back at Home Phone [phone]   Caller: Patient Summary of Call: pt needs to f/u w/ neal in April.  need permission to DB on 4/7.  she would like mid morning. pls advise Initial call taken by: De Nurse,  February 08, 2009 11:48 AM  Follow-up for Phone Call        ok dbl book  Denny Levy MD  February 08, 2009 2:16 PM

## 2010-12-24 NOTE — Miscellaneous (Signed)
Summary: shoulder surgery yesterday   Clinical Lists Changes had shoulder surgery yesterday & had a drain that came out. They wanted [place] to put it back in. Husband called surgeon & found out it will stay out-cannot put it back in.  It was done in [place]. using ice & pain meds. f/u in 1 week with surgeon. husband states it was "cleaned out"  FYI to mds here.Kennon Rounds CALDWELL RN  August 08, 2008 11:05 AM

## 2010-12-24 NOTE — Miscellaneous (Signed)
Summary: FYI RE: [facility]S/TS  CALLED PT. PT STATES, THAT SHE NEEDS TO TAKE HER [facility] AGAIN. SEE LETTER 2-09 BY DR.Khira Cudmore. REFILLED MEDS. FWD TO DR.Kendrell Lottman FOR REVIEW.Marland KitchenMarland KitchenTHEKLA SLADE CMA,  June 30, 2008 11:47 AM correct Clinical Lists Changes

## 2010-12-24 NOTE — Assessment & Plan Note (Signed)
Summary: was told to f/u in jan wp   Vital Signs:  Patient Profile:   70 Years Old Female Height:     64 inches Weight:      164 pounds BMI:     28.25 Temp:     97.8 degrees F Pulse rate:   76 / minute BP sitting:   138 / 72  (left arm) Cuff size:   regular  Pt. in pain?   yes    Location:   right shoulder    Intensity:   7  Vitals Entered By: Dennison Nancy RN (November 29, 2008 9:16 AM)              Is Patient Diabetic? No     History of Present Illness: cymbalta working well through holidays--had a few weepy spells but  overall feels better on it. More stable. 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 Trying to follow a good diet, taking medicines regularly. Not having any problems with medicines, no myalgias and no fatigue.  Follow up hypertension. Taking medicines regularly with no problems. Not having any any headaches or chest pains.  still having problems w hemorrhoid--the suppository seems like it does not stay "in" long enough t help. No bleeding, just some  pain w defecation. a few skin lesions she wants nme to look at  shoulder still painful--her ortho doctor has released her and she is done w PT--plans to try some aqua therapy    Current Allergies: * DILAUDID * FENTANYL  Past Medical History:    Reviewed history from 10/11/2008 and no changes required:       goiter - multinodular - dr sevier       Hypertension       Osteoarthritis--multiple--back, knees, shoulder              Osteopenia--bone density in ?2004--was on fosamax but at request of her son (dentist?) who was worried about osteonecrosis of jawbone) she stopped fosamax--was on it 3 y              2007 dx seronegative arthritis       Dr Hassan Buckler is rheumatologist              MRI R shoulder 6/09 shows total tear and atrophy w retraction of supraspinatus and infraspinatus (Dr Ranell Patrick)-- surgery for arthroscopic  clean out by Dr Carney Living 2009 [place], [place] [phone])              PT at [facility] PT Art Schullenberger.  Past Surgical History:    Reviewed history from 07/26/2008 and no changes required:       1986 hysterectomy BSO                2002 Acromioplasty for spurs ; DJD in for both rt and lt shoulders       2007 large synovial cyst removed from spine       2008 L5-S1 fusion Dr Harlen Labs       1986 left ankle surgery--tendon transfer and achilles lengthening at [facility] 2/200              1986 appendectomy        1998 tib fib fx        2004 toe surgery for hammer toe       2005 knee scope left medial meniscus tear     Review of Systems  still has some unsteadiness w gait   Physical Exam  General:     alert, well-developed, well-nourished, and well-hydrated.   Eyes:     pupils equal and pupils round.   Neck:     supple, full ROM, and no masses.  no thyromegally and no carotid bruits Lungs:     normal respiratory effort, normal breath sounds, and no wheezes.   Heart:     normal rate, regular rhythm, no murmur, and no gallop.   Msk:     normal strength upper and lower extremity except for limited right arm elevation above shoulder in frontal plain and laterally. Neurologic:     gait normal.   Skin:     small normal apperaing seborrheic keratosis on left bridge of nose and in front of right ear. Psych:     Oriented X3, memory intact for recent and remote, good eye contact, not anxious appearing, and not depressed appearing.      Impression & Recommendations:  Problem # 1:  EXTERNAL HEMORRHOIDS (ICD-455.3) Assessment: Improved improved but not resolved. will switch her to proctofoam and see how she does Orders: [facility]- Est  Level 4 (54098)   Problem # 2:  UNSPECIFIED HYPOTHYROIDISM (ICD-244.9) Assessment: Unchanged  Her updated medication list for this problem includes:    Synthroid 125 Mcg Tabs (Levothyroxine sodium) .Marland Kitchen... 1 tab by mouth once daily five days a week  and 1/2 tab by mouth 2 days a week  Orders: [facility]- Est  Level 4 (11914)   Problem # 3:  HYPERLIPIDEMIA (ICD-272.4) Assessment: Unchanged  Her updated medication list for this problem includes:    Pravachol 20 Mg Tabs (Pravastatin sodium) .Marland Kitchen... Take one tablet at night  Orders: [facility]- Est  Level 4 ([address]295)   Problem # 4:  HYPERTENSION (ICD-401.9) Assessment: Unchanged  Her updated medication list for this problem includes:    Atenolol 50 Mg Tabs (Atenolol) ..... Once daily    Accupril 5 Mg Tabs (Quinapril hcl) .Marland Kitchen... 1 by mouth once daily  Orders: [facility]- Est  Level 4 ([address]214)   Complete Medication List: 1)  Gabapentin 300 Mg Caps (Gabapentin) .... 4 by mouth qhs 2)  Atenolol 50 Mg Tabs (Atenolol) .... Once daily 3)  Accupril 5 Mg Tabs (Quinapril hcl) .Marland Kitchen.. 1 by mouth once daily 4)  Synthroid 125 Mcg Tabs (Levothyroxine sodium) .Marland Kitchen.. 1 tab by mouth once daily five days a week and 1/2 tab by mouth 2 days a week 5)  Pravachol 20 Mg Tabs (Pravastatin sodium) .... Take one tablet at night 6)  Oxazepam 15 Mg Caps (Oxazepam) .Marland Kitchen.. 1 by mouth at bedtime as needed insomnia (in place of clonazepam) 7)  Cymbalta 30 Mg Cpep (Duloxetine hcl) .Marland Kitchen.. 1 by mouth qd 8)  Calcium 600/vitamin D 600-400 Mg-unit Chew (Calcium carbonate-vitamin d) .... Two times a day 9)  Proctofoam Hc 1-1 % Foam (Hydrocortisone ace-pramoxine) .Marland Kitchen.. 1 applicator per rectum bod prn   Patient Instructions: 1)  rtc 3-4 months, check LDL, lfts and tsh then 2)  I will call in your refills   Prescriptions: PROCTOFOAM HC 1-1 % FOAM (HYDROCORTISONE ACE-PRAMOXINE) 1 applicator per rectum bod prn  #1 x 12   Entered and Authorized by:   Denny Levy MD   Signed by:   Denny Levy MD on 11/29/2008   Method used:   Electronically to        [facility]  [id number]       [address]  [place], [place]  [address]       Ph: [phone] or [phone]       Fax: [phone]   RxID:    [id number] SYNTHROID 125 MCG  TABS (LEVOTHYROXINE SODIUM) 1 tab by mouth once daily five days a week and 1/2 tab by mouth 2 days a week  #90 x 3   Entered and Authorized by:   Denny Levy MD   Signed by:   Denny Levy MD on 11/29/2008   Method used:   Electronically to        [facility]  [id number] ([facility]       [address]       [place], [place]  [address]       Ph: [phone] or [phone]       Fax: [phone]   RxID:   [id number] CYMBALTA 30 MG  CPEP (DULOXETINE HCL) 1 by mouth qd  #90 x 3   Entered and Authorized by:   Denny Levy MD   Signed by:   Denny Levy MD on 11/29/2008   Method used:   [facility]ronically to        [facility]  [id number]       [address]       [place], [place]  [address]       Ph: [phone] or [phone]       Fax: [phone]   RxID:   [id number] PRAVACHOL 20 MG  TABS (PRAVASTATIN SODIUM) take one tablet at night  #90 x 3   Entered and Authorized by:   Denny Levy MD   Signed by:   Denny Levy MD on 11/29/2008   Method used:   Electronically to        [facility]  [address] [id number]       [address]       [place], [place]  [address]       Ph: [phone] or [phone]       Fax: [phone]   RxID:   [id number] ACCUPRIL 5 MG  TABS (QUINAPRIL HCL) 1 by mouth once daily  #90 x 3   Entered and Authorized by:   Denny Levy MD   Signed by:   Denny Levy MD on 11/29/2008   Method used:   [facility]ronically to        [facility]  [address] [id number]       [address]       [place], [place]  [address]       Ph: [phone] or [phone]       Fax: [phone]   RxID:   [id number] ATENOLOL 50 MG  TABS (ATENOLOL) once daily  #90 x 3   Entered and Authorized by:   Denny Levy MD   Signed by:   Denny Levy MD on 11/29/2008   Method used:   [facility]ronically to        [facility]  [id number]       [address]       [place], [place]  [address]       Ph: [phone] or [phone]       Fax: [phone]   RxID:   [id number] GABAPENTIN 300 MG  CAPS (GABAPENTIN) 4 by mouth qhs  #360 x 3   Entered and Authorized by:   Denny Levy MD   Signed by:   Denny Levy MD on 11/29/2008   Method used:   Electronically to        [facility]  [address] [id number]       [address]       [place], [place]  [address]       Ph: [phone] or [phone]       Fax: [phone]   RxID:   [id number]  ]  Vital Signs:  Patient Profile:   70 Years Old Female Height:     64 inches Weight:      164 pounds BMI:     28.25 Temp:     97.8 degrees F Pulse rate:   76 / minute BP sitting:   138 / 72 Cuff size:   regular    Location:   right shoulder    Intensity:   7

## 2010-12-24 NOTE — Letter (Signed)
Summary: LAB Letter  [facility]  [address]   [place], [place] [address]   Phone: [phone]  Fax: [phone]    04/05/2009  [facility] [address] [place], [place]  [address]  Dear Destiny [name],  Your full thyroid panel was in normal range. We will not change your dose. I will probably check your thyroid once every 6 months--if we give you too much it can affect your bone health--if we give you too little your throid nodule may re-appear--so we will keep close eye on it.         Sincerely,   Denny Levy MD  Appended Document: LAB Letter mailed letter to pt

## 2010-12-24 NOTE — Progress Notes (Signed)
Summary: Appt   Phone Note Call from Patient Call back at Home Phone [phone]   Summary of Call: wants to be seen by Dr. [name] around 11/16, advised she was booked for the rest of the month, but we would ask if we could double book her. Initial call taken by: Haydee Salter,  September 26, 2008 12:25 PM  Follow-up for Phone Call        [facility] to dbl book  Denny Levy MD  September 27, 2008 2:51 PM

## 2010-12-24 NOTE — Miscellaneous (Signed)
  Clinical Lists Changes RED TEAM please set up physical therapy appointment. DX: recurrent falls, lower extremity weakness and gait disturbance.  Please send a copy of teh referral letter that is in centricity to PT as there is some important onfo for them about her weight bearing status. Thanks  ...................................................................Huntley Dec NEAL MD  November 08, 2007 11:53 AM  referral faxed to physical therapy outpatient rehab at [facility]. pt notified. pt has a concern as to whether we will precertify her. physical therapy will check with insurance company when pt registers. they advise she should contact insurance company on her own if she has any questions about  coverage. pt notified. ..................................................................Marland KitchenTheresia Lo RN  November 08, 2007 12:24 PM

## 2010-12-24 NOTE — Progress Notes (Signed)
Summary: Rx  Medications Added PRAVACHOL 20 MG  TABS (PRAVASTATIN SODIUM) take one tablet at night       Phone Note Refill Request Message from:  Patient  Refills Requested: Medication #1:  PRAVACHOL 20 MG  TABS ON HOLD states she called pharmacy a couple of weeks ago  Initial call taken by: Haydee Salter,  June 30, 2008 11:07 AM    New/Updated Medications: PRAVACHOL 20 MG  TABS (PRAVASTATIN SODIUM) take one tablet at night   Prescriptions: PRAVACHOL 20 MG  TABS (PRAVASTATIN SODIUM) take one tablet at night  #30 x 6   Entered by:   Arlyss Repress CMA,   Authorized by:   Denny Levy MD   Signed by:   Arlyss Repress CMA, on 06/30/2008   Method used:   Elect[facility] to ...       [facility]  [id number]       [address]       [place], [place]  [address]       Ph: [phone] or [phone]       Fax: [phone]   RxID:   [id number]

## 2010-12-24 NOTE — Progress Notes (Signed)
Summary: extended nausea   Phone Note Call from Patient Call back at Home Phone [phone]   Caller: Patient Summary of Call: Pt calling on cell # [phone] - having nausea for 5 days after severe vommitting.  Seen in ER at [facility].  Still having nausea today.   Initial call taken by: Rae Roam,  February 20, 2009 10:52 AM  Follow-up for Phone Call        states she has a hx of Hep A. this nausea reminds her of when she was sick with that. ED in [place] have her 2 L fluids & 3 different meds. she wants to be seen today. appt at 3 with Dr. Karn Pickler Follow-up by: Golden Circle RN,  February 20, 2009 10:59 AM

## 2010-12-24 NOTE — Letter (Signed)
Summary: *Referral Letter: PHYSICAL THERAPY  [facility]  [address]   [place], [place] [address]   Phone: [phone]  Fax: [phone]    11/08/2007 REFERRAL FOR PHYSICAL THERAPY Thank you in advance for agreeing to see my patient:  Destiny [name] [address] [place], [place]  [address]  Phone: [phone]  Reason for Referral: recurrent falls, lower extremity weakness and gait disturbance.   Please note she is being treated for a left foot arch problem and is limited in her weight bearing. Perhaps you could start with mostly non-weight bearing lower extremity exercise. She is walking but unclear how much she should be stressing that foot.  Procedures Requested:   Current Medical Problems: 1)  MENTAL CONFUSION (ICD-298.9) 2)  HYPERLIPIDEMIA (ICD-272.4) 3)  ACCIDENTAL FALLS, RECURRENT (ICD-E888.9) 4)  HYPERTENSION (ICD-401.9) 5)  NEUROPATHY, IDIOPATHIC PERIPHERAL NEC (ICD-356.8) 6)  FOOT PAIN, LEFT (ICD-729.5) 7)  ROTATOR CUFF INJURY, RIGHT SHOULDER (ICD-726.10) 8)  BACK PAIN (ICD-724.5) 9)  NEURALGIA (ICD-729.2) 10)  OSTEOPENIA (ICD-733.90) 11)  OSTEOARTHRITIS (ICD-715.90)   Current Medications: 1)  GABARONE 300 MG  TABS (GABAPENTIN) taper up to 1 am/ 1 lunch /1 4pm and 3 by mouth at bedtime. 2)  ATENOLOL 50 MG  TABS (ATENOLOL) once daily 3)  ACCUPRIL 10 MG  TABS (QUINAPRIL HCL) once daily 4)  DEMADEX 10 MG  TABS (TORSEMIDE) 1-2 by mouth once daily 5)  SYNTHROID 125 MCG  TABS (LEVOTHYROXINE SODIUM) 1 tab by mouth once daily five days a week and 1/2 tab by mouth 2 days a week 6)  PRAVACHOL 20 MG  TABS (PRAVASTATIN SODIUM) ON HOLD 7)  CLONAZEPAM 2 MG  TABS (CLONAZEPAM) 1 by mouth at bedtime as needed insomnia; try to discontinue and do not use with tramodol 8)  CYMBALTA 30 MG  CPEP (DULOXETINE HCL) once daily 9)  TRAMADOL HCL 50 MG TABS (TRAMADOL HCL) Take 1 tablet by mouth  at bedtime if needed   Past Medical History: 1)  goiter  - multinodular - dr sevier 2)  Hypertension 3)  Osteoarthritis 4)  Osteopenia 5)  S/P laminectomy L4 for synovial cyst with neural impingement 6)  Dr Hassan Buckler is rheumatologist   Prior History of Blood Transfusions:   Pertinent Labs:    Thank you again for agreeing to see our patient; please contact us if you have any further questions or need additional information.  Sincerely,  Denny Levy MD

## 2010-12-24 NOTE — Consult Note (Signed)
Summary: [facility]   Imported By: Knox Royalty 09/08/2008 09:48:18  _____________________________________________________________________  External Attachment:    Type:   Image     Comment:   External Document

## 2010-12-24 NOTE — Consult Note (Signed)
Summary: [facility]   [facility]   Imported By: Clydell Hakim 05/22/2009 15:08:40  _____________________________________________________________________  External Attachment:    Type:   Image     Comment:   External Document

## 2010-12-24 NOTE — Assessment & Plan Note (Signed)
Summary: PAIN IN LEG AND FOOT/ACM   Vital Signs:  Patient Profile:   70 Years Old Female Weight:      160 pounds Pulse rate:   67 / minute BP sitting:   120 / 67  Vitals Entered By: Lillia Pauls CMA (January 28, 2007 11:00 AM)               Chief Complaint:  L FOOT/R LEG PAIN.  History of Present Illness: Patient enters c/o pain that comes down rt leg lateral aspect all the way to the calf. fell before surgery.  hasn't fallen since.  Gets pain in same distribution as where she had synovial cyst and rt L4 laminectomy  Lt foot has arch pain asn this makes this worse/  had hammer toe fixed and webbed on 4th and 5th MT. Pain on lt does increase foot pain on rt  DJD has flared in lt hand.  now on Mobic for this.  both mother and sister had significant arthritis. no hx of RA. Syoher has treated hands.  wainer knees.  Meds current Fosamx 70 synthroid 0.125 accupril 10 clonazepam 2 mg atenolol 50 pravastatin 20 darvocet n 100    Past Medical History:    goiter - multinodular - dr sevier    Hypertension    Osteoarthritis    Osteopenia    S/P laminectomy L4 for synovial cyst with neural impingement    Risk Factors:  Tobacco use:  never   Review of Systems       see hpi   Physical Exam  General:     Well-developed,well-nourished,in no acute distress; alert,appropriate and cooperative throughout examination Msk:     decreased ROM, joint tenderness, joint swelling, enlarged MCP joints, and enlarged PIP joints.  signinficant changes to lt hand > rt.  4th PIP still swollen  rt hip ROM only 45 degrees/ lt hip 55 degrees/  good abduction strength FABER + bilat SLR neg bilat no weakness on testing Ll1 to S2 nerve root good heel and toe walk/ tandem walk Radicular sxs to rt lat leg with bending and provocative manouevers  Knees - bilat DJD changes  Rt shoulder weakness with ext rot and with abduction and elevation.  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 surgery - dr Renae Fickle may need injections on fu   Patient Instructions: 1)  Please schedule a follow-up appointment in 1 month. 2)  You may move around but avoid painful motions. Apply ice to sore area for 20 minutes 3-4 times a day for 2-3 days. 3)  Most patients (90%) with low back pain will improve with time (2-6 weeks). Keep active but avoid activities that are painful. Apply moist heat and/or ice to lower back several times a day.  Appended Document: Orders Update    Clinical Lists Changes  Orders: Added new Test order of [facility]- Est  Level 4 ([address]) - Signed

## 2010-12-24 NOTE — Assessment & Plan Note (Signed)
Summary: fu/kh  Medications Added TRAMADOL HCL 50 MG TABS (TRAMADOL HCL) Take 1 tablet by mouth every six hours        Vital Signs:  Patient Profile:   70 Years Old Female Height:     64 inches Pulse rate:   68 / minute BP sitting:   101 / 50  Vitals Entered By: Lillia Pauls CMA (October 27, 2007 11:37 AM)                 Chief Complaint:  FU NEUROPATHY and IDIOPATHIC PERIPHERA.  History of Present Illness: Nykerria enters with ongoing probs.  saw Dr Lajoyce Corners after left foot sprain. All along medial border of arch. fell standing on chair. concern about possible partial tear of flex hallucis or post tib? did not do well in boot now in ASO and this feels better  In general lots of MS complaints these clearly are better with less neuro sxs if she takes neurontin 300 three times a day and 900 hs still using at least 400 three times a day of ibuprofen pain is still moderate level and stiffness in AM  question side effects of neurontin - does fell loopy memory - not sure if she is remembering well - cancelled rheum appt/ thought dr neal was rheum  Current Allergies: * DILAUDID * FENTANYL      Physical Exam  General:     somewhat less alert and seems to be forgetting certain questions I asked Head:     Normocephalic and atraumatic without obvious abnormalities. No apparent alopecia or balding. Eyes:     wearing glasses Msk:     tenderness all along flexor hallucis and post tib tendons pain on great toe resisted flexion minimal pain on resisted toe flexion mod pain on resisted plantar flex and inversion  sl swelling along tendon sheets through tarsal tunnel ligament seems stable  no real leg length difference noted pain on back motion with extension of flexion  neg SLR pelvis seems tight at Rt SIJ    Impression & Recommendations:  Problem # 1:  FOOT PAIN, LEFT (ICD-729.5) now under treatment dr duda for possible tendon tear cont using ASO and  rest Orders: [facility]- Est Level  3 ([address])   Problem # 2:  NEUROPATHY, IDIOPATHIC PERIPHERAL NEC (ICD-356.8) perhaps some improvement in sxs with neurontin at 1800 per day however, I think she may be having some memory sideeffects try adding tramadol for pain and hopefully to block some of sedative side effects of neurontin  I think a rheumatology evaluation is still a good idea and referred bck  afterwards return to see dr neal for std care Orders: [facility]- Est Level  3 ([address])   Problem # 3:  ROTATOR CUFF INJURY, RIGHT SHOULDER (ICD-726.10) reviewed postural exercises and beginning reverse flys with ext rotation at shoulder and at waist level Orders: [facility]- Est Level  3 ([address])   Complete Medication List: 1)  Gabarone 300 Mg Tabs (Gabapentin) .... Taper up to 1 am/ 1 lunch /1 4pm and 3 by mouth at bedtime. 2)  Atenolol 50 Mg Tabs (Atenolol) .... Once daily 3)  Accupril 10 Mg Tabs (Quinapril hcl) .... Once daily 4)  Demadex 10 Mg Tabs (Torsemide) .Marland Kitchen.. 1-2 by mouth once daily 5)  Synthroid 125 Mcg Tabs (Levothyroxine sodium) .Marland Kitchen.. 1 tab by mouth once daily five days a week and 1/2 tab by mouth 2 days a week 6)  Pravachol 20 Mg Tabs (Pravastatin sodium) .Marland Kitchen.. 1 by  mouth once daily 7)  Clonazepam 2 Mg Tabs (Clonazepam) .Marland Kitchen.. 1 by mouth at bedtime as needed insomnia 8)  Cymbalta 30 Mg Cpep (Duloxetine hcl) .... Once daily 9)  Tramadol Hcl 50 Mg Tabs (Tramadol hcl) .... Take 1 tablet by mouth every six hours   Patient Instructions: 1)  APPT WITH DR TRUSLOW ON 12/18 AT 10:45. [phone]    ]

## 2010-12-24 NOTE — Consult Note (Signed)
Summary: [facility]   Imported By: Denny Peon LEVAN 06/02/2007 11:43:03  _____________________________________________________________________  External Attachment:    Type:   Image     Comment:   External Document

## 2010-12-24 NOTE — Consult Note (Signed)
Summary: [facility]  [facility]   Imported By: Bradly Bienenstock 07/07/2007 11:14:05  _____________________________________________________________________  External Attachment:    Type:   Image     Comment:   External Document  Appended Document: [facility] LS stabilization surgery recommended for recurrent raduculopathy

## 2010-12-24 NOTE — Assessment & Plan Note (Signed)
Summary: f/u,df   Vital Signs:  Patient profile:   70 year old female Height:      61.5 inches Weight:      156.5 pounds Temp:     97.2 degrees F oral Pulse rate:   69 / minute BP sitting:   127 / 63  (right arm)  Vitals Entered By: Alphia Kava (February 28, 2009 10:07 AM)  CC: f/u Is Patient Diabetic? No   History of Present Illness: 1) GI:  f/u of recent ed visit (in [facility] [place]) for vomiting and diarrhea--suspected gastroenteritis . Still not feeling back to normal. nause and vomiting gone but she is contiuing to have lower abdominal pain, feels like an exacerbation of her irritaable boel symptoms she used to have--has nothad that for several years. describes as bloated feeling, vaguely intermittently crampy, feeling of mild constipation although she is still having daily bm.  2) FATIGUE:  Denies fever. No DOE.No chest pains. Not up to her usual exercise tolerance though as she becomes fatigued.   3)  urnary symptoms:  No urinary burning or increase in frequency but says her bladder 'hurts' at times--not relieved necessarily by urination. Spasm type pain that self resolves--worse in am. Also says her "kidneys do not work" as well (not urinating as often) when she is upright (during the day),  but after she les down at night she has to get up 2-3 times in first hour or so of being upine to urinate.  Habits & Providers     Tobacco Status: never  Allergies: 1)  * Dilaudid 2)  * Fentanyl  Review of Systems       The patient complains of weight loss and abdominal pain.  The patient denies anorexia, fever, chest pain, syncope, dyspnea on exertion, peripheral edema, melena, severe indigestion/heartburn, hematuria, incontinence, and muscle weakness.    Physical Exam  General:  alert, well-developed, well-nourished, and well-hydrated.   Neck:  supple, full ROM, no masses, no thyroid nodules or tenderness, and no carotid bruits.   Lungs:  normal respiratory effort, normal breath  sounds, and no wheezes.   Heart:  normal rate, regular rhythm, and no murmur.   Abdomen:  soft, non-tender, normal bowel sounds, no distention, no masses, no guarding, and no rigidity.  Bladder area non tender to palpation  Neurologic:  alert & oriented X3 and gait normal.   Psych:  Oriented X3, memory intact for recent and remote, normally interactive, good eye contact, not anxious appearing, and not depressed appearing.     Impression & Recommendations:  Problem # 1:  MALAISE AND FATIGUE (ICD-780.79) Orders: Comp Met-[facility] ([id number]) CBC w/Diff-[facility] (03474) [facility]- Est  Level 4 (99214)Urinalysis-[facility] (00000) recent gastroenteritis which seems to be continuing with irritable bowel symptoms. Discussed alcohol intake with her and she denies using any alcohol at this time. reviewed other dietary items such as caffeine etc taht she should avoid until her I tract back to nomral low sodium at last visit and will recheck. There was some notation in ED note about husbands concern or alcohol intake-l--I will also check B 12 level.  Problem # 2:  ABDOMINAL PAIN, UNSPECIFIED SITE (ICD-789.00)  Orders: Comp Met-[facility] ([id number]) CBC w/Diff-[facility] (43329) see above  Problem # 3:  UNSPECIFIED HYPOTHYROIDISM (ICD-244.9) Orders: TSH-[facility] ([id number]) [facility]- Est  Level 4 (30160)  Complete Medication List: 1)  Gabapentin 300 Mg Caps (Gabapentin) .... 4 by mouth qhs 2)  Atenolol 50 Mg Tabs (Atenolol) .... Once daily 3)  Accupril 5 Mg  Tabs (Quinapril hcl) .Marland Kitchen.. 1 by mouth once daily 4)  Synthroid 125 Mcg Tabs (Levothyroxine sodium) .Marland Kitchen.. 1 tab by mouth once daily five days a week and 1/2 tab by mouth 2 days a week 5)  Pravachol 20 Mg Tabs (Pravastatin sodium) .... Take one tablet at night 6)  Oxazepam 15 Mg Caps (Oxazepam) .Marland Kitchen.. 1 by mouth at bedtime as needed insomnia (in place of clonazepam) 7)  Cymbalta 30 Mg Cpep (Duloxetine hcl) .Marland Kitchen.. 1 by mouth qd 8)  Calcium 600/vitamin D 600-400 Mg-unit Chew (Calcium  carbonate-vitamin d) .... Two times a day 9)  Proctofoam Hc 1-1 % Foam (Hydrocortisone ace-pramoxine) .Marland Kitchen.. 1 applicator per rectum bod prn 10)  Promethazine Hcl 25 Mg Tabs (Promethazine hcl) .... 1/2-1 tablet by mouth q 6 hrs as needed nausea 11)  Ranitidine Hcl 150 Mg Caps (Ranitidine hcl) .Marland Kitchen.. 1 tab by mouth daily Comp Met-[facility] [phone]) CBC w/Diff-[facility] (66440) TSH-[facility] (34742-59563) Urinalysis-[facility] (00000) [facility]- Est  Level 4 (87564) B12-[facility] (33295-18841)  Patient Instructions: 1)  Please schedule a follow-up appointment in 2 months.    Impression & Recommendations:  Orders: Comp Met-[facility] [phone]) CBC w/Diff-[facility] [phone])   Orders: Comp Met-[facility] [phone]) CBC w/Diff-[facility] (27062) [facility]- Est  Level 4 (99214)    Orders: Comp Met-[facility] ([id number]) CBC w/Diff-[facility] (61607) [facility]- Est  Level 4 (37106) B12-[facility] (26948-54627)    Orders: Comp Met-[facility] ([id number]) CBC w/Diff-[facility] (29937) Urinalysis-[facility] (00000)     Orders: Comp Met-[facility] ([id number]) CBC w/Diff-[facility] (85025) Urinalysis-[facility] (00000) [facility]- Est  Level 4 (01751)      Her updated medication list for this problem includes:    Synthroid 125 Mcg Tabs (Levothyroxine sodium) .Marland Kitchen... 1 tab by mouth once daily five days a week and 1/2 tab by mouth 2 days a week  Orders: TSH-[facility] ([id number])     Orders: TSH-[facility] ([id number]) [facility]- Est  Level 4 (15400)      Complete Medication List: 1)  Gabapentin 300 Mg Caps (Gabapentin) .... 4 by mouth qhs 2)  Atenolol 50 Mg Tabs (Atenolol) .... Once daily 3)  Accupril 5 Mg Tabs (Quinapril hcl) .Marland Kitchen.. 1 by mouth once daily 4)  Synthroid 125 Mcg Tabs (Levothyroxine sodium) .Marland Kitchen.. 1 tab by mouth once daily five days a week and 1/2 tab by mouth 2 days a week 5)  Pravachol 20 Mg Tabs (Pravastatin sodium) .... Take one tablet at night 6)  Oxazepam 15 Mg Caps (Oxazepam) .Marland Kitchen.. 1 by mouth at bedtime as needed insomnia (in place of clonazepam) 7)  Cymbalta 30 Mg Cpep (Duloxetine hcl)  .Marland Kitchen.. 1 by mouth qd 8)  Calcium 600/vitamin D 600-400 Mg-unit Chew (Calcium carbonate-vitamin d) .... Two times a day 9)  Proctofoam Hc 1-1 % Foam (Hydrocortisone ace-pramoxine) .Marland Kitchen.. 1 applicator per rectum bod prn 10)  Promethazine Hcl 25 Mg Tabs (Promethazine hcl) .... 1/2-1 tablet by mouth q 6 hrs as needed nausea 11)  Ranitidine Hcl 150 Mg Caps (Ranitidine hcl) .Marland Kitchen.. 1 tab by mouth daily   Appended Document: UA results  Laboratory Results   Urine Tests  Date/Time Received: February 28, 2009 10:36 AM  Date/Time Reported: February 28, 2009 11:11 AM   Routine Urinalysis   Color: yellow Appearance: Hazy Glucose: negative   (Normal Range: Negative) Bilirubin: negative   (Normal Range: Negative) Ketone: trace (5)   (Normal Range: Negative) Spec. Gravity: 1.015   (Normal Range: 1.003-1.035) Blood: trace-intact   (Normal Range: Negative) pH: 7.0   (Normal Range: 5.0-8.0) Protein: 30   (Normal Range: Negative) Urobilinogen:  0.2   (Normal Range: 0-1) Nitrite: negative   (Normal Range: Negative) Leukocyte Esterace: large   (Normal Range: Negative)  Urine Microscopic WBC/HPF: >20 RBC/HPF: 1-5 Bacteria/HPF: 3+ Epithelial/HPF: rare    Comments: ...........test performed by...........Marland KitchenTerese Door, CMA

## 2010-12-24 NOTE — Progress Notes (Signed)
   Phone Note Outgoing Call   Summary of Call: DEAR RED TEAM:  please schedule Mrs Hogsett w Dr Ranell Patrick at [facility] ortho for eval of her right shoulder pain thanks  Denny Levy MD  Apr 12, 2008 3:14 PM   Follow-up for Phone Call        Appt scheduled.  See order.  Pt informed Follow-up by: [facility] CMA,  Apr 12, 2008 4:58 PM

## 2010-12-24 NOTE — Letter (Signed)
Summary: *Referral Letter  [facility]  [address]   [place], [place] [address]   Phone: [phone]  Fax: [phone]    09/06/2007  Dr. Melynda Keller, [place]  Dear Dr. Kellie Simmering:  Thank you in advance for agreeing to see my patient:  Destiny [name] [address] [place], [place]  [address]  Phone: [phone]  Reason for Referral: I have treated [name] for multiple muscoloskeletal complaints.  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  See records from [facility].  I have also treated her for rotator cuff and foot symptoms.  She may have more generalized osteoarthritis and systemic problems and I would like your opinion and suggestions about this.  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  These problems are limiting her function considerably.  Procedures Requested:   Current Medical Problems: 1)  FOOT PAIN, LEFT (ICD-729.5) 2)  ROTATOR CUFF INJURY, RIGHT SHOULDER (ICD-726.10) 3)  BACK PAIN (ICD-724.5) 4)  NEURALGIA (ICD-729.2) 5)  OSTEOPENIA (ICD-733.90) 6)  OSTEOARTHRITIS (ICD-715.90) 7)  HYPERTENSION (ICD-401.9)   Current Medications:   Past Medical History: 1)  goiter - multinodular - dr sevier 2)  Hypertension 3)  Osteoarthritis 4)  Osteopenia 5)  S/P laminectomy L4 for synovial cyst with neural impingement     Thank you again for agreeing to see our patient; please contact us if you have any further questions or need additional information.  Sincerely,   Vincent Gros MD

## 2010-12-24 NOTE — Miscellaneous (Signed)
   Clinical Lists Changes  Observations: Added new observation of MAMMO DUE: 05/29/2010 (05/31/2009 9:40)     Last Mammogram:  ASSESSMENT: Negative - BI-RADS 1^MM DIGITAL SCREENING (05/29/2009 12:27:00 PM) Mammogram Next Due:  1 yr

## 2010-12-24 NOTE — Progress Notes (Signed)
Summary: Cymbalta  Medications Added CYMBALTA 30 MG  CPEP (DULOXETINE HCL) 1 by mouth qd       Phone Note Call from Patient Call back at [facility] Phone [phone]   Summary of Call: Pt wants MD to know that she is currently taking 60 mg of Cymbalta, states she accidentally told MD 30 mg. Initial call taken by: Haydee Salter,  Apr 12, 2008 12:24 PM  Follow-up for Phone Call        will forward message to MD. Follow-up by: Theresia Lo RN,  Apr 12, 2008 12:30 PM    New/Updated Medications: CYMBALTA 30 MG  CPEP (DULOXETINE HCL) 1 by mouth qd   Prescriptions: CYMBALTA 30 MG  CPEP (DULOXETINE HCL) 1 by mouth qd  #30 x 12   Entered and Authorized by:   Denny Levy MD   Signed by:   Denny Levy MD on 04/12/2008   Method used:   Electronically sent to ...       [facility]  [address] [id number]       [address]       [place], [place]  [address]       Ph: [phone] or [phone]       Fax: [phone]   RxID:   [id number]

## 2010-12-24 NOTE — Miscellaneous (Signed)
Summary: [facility]   Imported By: De Nurse 02/23/2009 11:26:30  _____________________________________________________________________  External Attachment:    Type:   Image     Comment:   External Document

## 2010-12-24 NOTE — Miscellaneous (Signed)
  Clinical Lists Changes 

## 2010-12-24 NOTE — Assessment & Plan Note (Signed)
Summary: flu shot   Nurse Visit    Prior Medications: GABARONE 300 MG  TABS (GABAPENTIN) 3 by mouth qhs ATENOLOL 50 MG  TABS (ATENOLOL) once daily ACCUPRIL 5 MG  TABS (QUINAPRIL HCL) 1 by mouth once daily SYNTHROID 125 MCG  TABS (LEVOTHYROXINE SODIUM) 1 tab by mouth once daily five days a week and 1/2 tab by mouth 2 days a week PRAVACHOL 20 MG  TABS (PRAVASTATIN SODIUM) take one tablet at night OXAZEPAM 15 MG CAPS (OXAZEPAM) 1 by mouth at bedtime as needed insomnia (in place of clonazepam) CYMBALTA 30 MG  CPEP (DULOXETINE HCL) 1 by mouth qd METHOCARBAMOL 750 MG  TABS (METHOCARBAMOL) 1 by mouth up to q 8 prn CALCIUM 600/VITAMIN D 600-400 MG-UNIT CHEW (CALCIUM CARBONATE-VITAMIN D) two times a day Current Allergies: * DILAUDID * FENTANYL   Influenza Vaccine    Vaccine Type: Fluvax MCR    Site: left deltoid    Mfr: GlaxoSmithKline    Dose: 0.5 ml    Route: IM    Given by: Arlyss Repress CMA,    Exp. Date: 05/23/2009    Lot #: [id number]    VIS given: 06/17/07 version given September 08, 2008.  Flu Vaccine Consent Questions    Do you have a history of severe allergic reactions to this vaccine? no    Any prior history of allergic reactions to egg and/or gelatin? no    Do you have a sensitivity to the preservative Thimersol? no    Do you have a past history of Guillan-Barre Syndrome? no    Do you currently have an acute febrile illness? no    Have you ever had a severe reaction to latex? no    Vaccine information given and explained to patient? yes    Are you currently pregnant? no  Vaccine Consent Questions for Influenza    Do you have a history of severe allergic reactions to this vaccine? no    Any prior history of allergic reactions to egg and/or gelatin? no    Do you currently have an acute febrile illness? no    Have you ever had a severe reaction to latex? no    Patient is moderately or severely ill? no    Vaccine information given and explained to patient? yes    Are you  currently pregnant? no   Orders Added: 1)  Influenza Vaccine MCR [00025] 2)  Est Level 1- [facility] [09811] 3)  Flu Vaccine & Administration Baden.Dew    ]

## 2010-12-24 NOTE — Letter (Signed)
Summary: Wellness visit Letter  [facility]  [address]   [place], [place] [address]   Phone: [phone]  Fax: [phone]    04/10/2010  [facility] [address] [place], [place]  [address]  Dear Ms. Wisser,   We are happy to let you know that since you are covered under Medicare you are able to have a FREE visit at the [facility] to discuss your HEALTH. This is a new benefit for Medicare.  There will be no co-payment.  At this visit you will meet with Luretha Murphy an expert in wellness and the nurse practitioner at our clinic.  At this visit we will discuss ways to keep you healthy and feeling well.  This visit will not replace your regular doctor visit and we cannot refill medications.  We may schedule future blood work, give shots if needed, or schedule tests to look for hidden problems.   You will need to plan to be here at least one hour to talk about your medical history, your current status, review all of your medications, and discuss your future plans for your health.  This information will be entered into your record for your doctor to have and review.  If you are interested in staying healthy, this type of visit can help.  Please call the office at: [phone], to schedule a "Medicare Wellness Visit".  The day of the visit you should bring in all of your medications, including any vitamins, herbs, over the counter products you take.  Make a list of all the other doctors that you see, so we know who they are. If you have any other health documents please bring them.  We look forward to helping you stay healthy.   Sincerely,   Luretha Murphy NP  Appended Document: Wellness visit Letter mailed.

## 2010-12-24 NOTE — Progress Notes (Signed)
Summary: Triage  Phone Note Call from Patient Call back at Home Phone [phone]   Summary of Call: Pt states she has had diarrhea for the past week.  She gets it when she lies down for bed.  She has lost 10 lbs over the past week. Initial call taken by: Haydee Salter,  November 15, 2007 9:10 AM  Follow-up for Phone Call        states she has had diarrhea at night , 3-4 times each night, for the past week . this is following having a total body massage she states.  she is concerned that there maybe some connection. reaasured pt that this would not cause diarrhea. she only has diarrhea at night when she lays down. offered appointment today   or tomorrow. she only wants to see Dr. [name] or Dr. Darrick Penna. explained that neither doctor is in clinic for an appointment to be scheduled . states she will call Dr. Oneta Rack the doctor she has seen for 20 years in private practice for appointment. Follow-up by: Theresia Lo RN,  November 15, 2007 9:19 AM

## 2010-12-24 NOTE — Assessment & Plan Note (Signed)
Summary: f/u,df   Vital Signs:  Patient profile:   70 year old female Height:      61.5 inches Weight:      159.56 pounds BMI:     29.77 Temp:     98.4 degrees F Pulse rate:   74 / minute BP sitting:   138 / 74  (left arm) Cuff size:   regular  Vitals Entered By: Dennison Nancy RN (June 06, 2009 11:07 AM) CC: Follow up Is Patient Diabetic? No Pain Assessment Patient in pain? yes     Location: feet and r shoulder Intensity: 4 Onset of pain  Chronic   CC:  Follow up.  History of Present Illness: left toe pain for several days--thinks she is getting ingrown toenail. toe hurts so bad she cannot walk on it. no redness, no oozing or pus  f/u continued r shoulder pain--has questions about where to go next--new ortho should3er specialist joining [facility] ortho. Dr Yehuda Budd (did her last surgery) says he has nothing else4 to offer her  neuropathic pain in feet and legs and chronic back pain still bothering her. ortho rec she start 40 mg amytriptylene and she did that a week ago--haas continued her cymbalta. Says it seems ot be helping altho she is having more dry mouth  had her mammogram and her bone density test dione. Taking calcium and vit D  Habits & Providers  Alcohol-Tobacco-Diet     Tobacco Status: never  Allergies: 1)  * Dilaudid 2)  * Fentanyl  Physical Exam  Msk:  left great toenail is curved inward biting into skin, some granulomatous tissue noted but no sign infection Neurologic:  alert & oriented X3, sensation intact to light touch, and gait normal.   Psych:  Oriented X3, memory intact for recent and remote, normally interactive, and good eye contact.     Impression & Recommendations:  Problem # 1:  INGROWN TOENAIL (ICD-703.0)  trimmed it today and if still bothering her recommend partial nail removal which we could do next week./ She will call in next day or two if this is still botjhering her  Orders: [facility]- Est  Level 4 (47425)  Problem # 2:  LEG PAIN,  BILATERAL (ICD-729.5)  reviewed Dr Gypsy Lore med assessment and agree cymbalta plus amitryptilene not optimal--she is seeing some benefit tho--we will leave as is and f/u 3-4 weeks.  Other option would be to d/c elavil and increase cymbalta althio she has not been in favor of that in past.  Orders: [facility]- Est  Level 4 (99214)  Problem # 3:  ROTATOR CUFF INJURY, RIGHT SHOULDER (ICD-726.10)  not much improvement since her last shouler durgery. I think fine if she sees new ortho doctor but would get 2 opinions prior to more surgery and she agrees. She also wants to f/u w her [facility] at [facility] re possibilityy of rhizotomy for shoulder pain (?)  Orders: [facility]- Est  Level 4 (95638)  Complete Medication List: 1)  Gabapentin 300 Mg Caps (Gabapentin) .... 4 by mouth qhs 2)  Atenolol 50 Mg Tabs (Atenolol) .... Once daily 3)  Accupril 5 Mg Tabs (Quinapril hcl) .Marland Kitchen.. 1 by mouth once daily 4)  Synthroid 125 Mcg Tabs (Levothyroxine sodium) .Marland Kitchen.. 1 tab by mouth once daily five days a week and 1/2 tab by mouth 2 days a week 5)  Pravachol 20 Mg Tabs (Pravastatin sodium) .... Take one tablet at night 6)  Oxazepam 15 Mg Caps (Oxazepam) .Marland Kitchen.. 1 by mouth at bedtime as needed  insomnia (in place of clonazepam) 7)  Cymbalta 30 Mg Cpep (Duloxetine hcl) .Marland Kitchen.. 1 by mouth qd 8)  Calcium 600/vitamin D 600-400 Mg-unit Chew (Calcium carbonate-vitamin d) .... Two times a day 9)  Ranitidine Hcl 150 Mg Caps (Ranitidine hcl) .Marland Kitchen.. 1 tab by mouth daily 10)  B-12 250 Mcg Tabs (Cyanocobalamin) .Marland Kitchen.. 1-2 by mouth qd 11)  Amitriptyline Hcl 10 Mg Tabs (Amitriptyline hcl) .... One each am, one at 4pm and two at bedtime. 12)  Ibuprofen 400 Mg Tabs (Ibuprofen) .... Two times a day as needed for pain 13)  Acetaminophen 500 Mg Tabs (Acetaminophen) .Marland Kitchen.. 1 as needed for pain - up to a total of 4 pills per day 14)  Fish Oil Oil (Fish oil) .... 2 pills taily - one in the am and one in the pm 15)  Biotin 300 Mcg Tabs (Biotin) .... One daily 16)  Folic  Acid 400 Mcg Tabs (Folic acid) .... One daily 17)  Aspirin 81 Mg Tbec (Aspirin) .... One every 3 days   Prevention & Chronic Care Immunizations   Influenza vaccine: Fluvax MCR  (09/08/2008)   Influenza vaccine due: 09/08/2009    Tetanus booster: 11/25/2004: given   Tetanus booster due: 11/25/2014    Pneumococcal vaccine: given  (11/25/2004)   Pneumococcal vaccine due: None    H. zoster vaccine: Not documented  Colorectal Screening   Hemoccult: Not documented   Hemoccult action/deferral: Not indicated  (06/06/2009)   Hemoccult due: Not Indicated    Colonoscopy: normal  (11/24/2006)   Colonoscopy due: 11/24/2016  Other Screening   Pap smear: Not documented   Pap smear due: Not Indicated    Mammogram: ASSESSMENT: Negative - BI-RADS 1^MM DIGITAL SCREENING  (05/29/2009)   Mammogram due: 05/29/2010    DXA bone density scan: T score -2.1  (06/06/2009)   DXA scan due: 06/07/2011     Smoking status: never  (06/06/2009)  Hypertension   Last Blood Pressure: 138 / 74  (06/06/2009)   Serum creatinine: 0.93  (02/28/2009)   Serum potassium 4.5  (02/28/2009)  Lipids   Total Cholesterol: 167  (11/05/2007)   LDL: 79  (11/05/2007)   LDL Direct: 128  (07/26/2008)   HDL: 45  (11/05/2007)   Triglycerides: 197  (11/05/2007)    SGOT (AST): 19  (02/28/2009)   SGPT (ALT): 17  (02/28/2009)   Alkaline phosphatase: 65  (02/28/2009)   Total bilirubin: 0.3  (02/28/2009)  Self-Management Support :    Hypertension self-management support: Not documented    Lipid self-management support: Not documented

## 2010-12-24 NOTE — Consult Note (Signed)
[facility]   Imported By: Bradly Bienenstock 05/04/2007 13:57:44  _____________________________________________________________________  External Attachment:    Type:   Image     Comment:   External Document

## 2010-12-24 NOTE — Letter (Signed)
Summary: *Referral Letter: Destiny [name] [facility]  [address]   [place], [place] [address]   Phone: [phone]  Fax: [phone]    05/20Dr Ranell Patrick at [facility]====her rheumatologist wants her to see this orthopedist Thank you in advance for agreeing to see my patient:  Destiny [name] [address] [place], [place]  [address]  Phone: [phone]  Reason for Referral: r shoulder pain, hx of rotator cuff issues and spur removal in past. Worsening shoulder pain now.  Procedures Requested:   Current Medical Problems:  3)  HYPERLIPIDEMIA (ICD-272.4) 4)  ACCIDENTAL FALLS, RECURRENT (ICD-E888.9) 5)  HYPERTENSION (ICD-401.9) 6)  NEUROPATHY, IDIOPATHIC PERIPHERAL NEC (ICD-356.8) 7)  FOOT PAIN, LEFT (ICD-729.5) 8)  ROTATOR CUFF INJURY, RIGHT SHOULDER (ICD-726.10) 9)  BACK PAIN (ICD-724.5) 10)  NEURALGIA (ICD-729.2) 11)  OSTEOPENIA (ICD-733.90) 12)  OSTEOARTHRITIS (ICD-715.90)   Current Medications: 1)  GABARONE 300 MG  TABS (GABAPENTIN) 3 by mouth qhs 2)  ATENOLOL 50 MG  TABS (ATENOLOL) once daily 3)  ACCUPRIL 5 MG  TABS (QUINAPRIL HCL) 1 by mouth once daily 4)  SYNTHROID 125 MCG  TABS (LEVOTHYROXINE SODIUM) 1 tab by mouth once daily five days a week and 1/2 tab by mouth 2 days a week 5)  PRAVACHOL 20 MG  TABS (PRAVASTATIN SODIUM) ON HOLD 6)  CLONAZEPAM 2 MG  TABS (CLONAZEPAM) 1 by mouth at bedtime as needed insomnia; try to discontinue and do not use with tramodol 7)  CYMBALTA 30 MG  CPEP (DULOXETINE HCL) 1 by mouth qd 8)  TRAMADOL HCL 50 MG TABS (TRAMADOL HCL) Take 1 tablet by mouth  at bedtime if needed 9)  METHOCARBAMOL 750 MG  TABS (METHOCARBAMOL) 1 by mouth up to q 8 prn   Past Medical History: 1)  goiter - multinodular - dr sevier 2)  Hypertension 3)  Osteoarthritis 4)  Osteopenia 5)  S/P laminectomy L4 for synovial cyst with neural impingement 6)  Dr Hassan Buckler is rheumatologist   Prior History of Blood Transfusions:    Pertinent Labs:    Thank you again for agreeing to see our patient; please contact us if you have any further questions or need additional information.  Sincerely,  Johnney Scarlata MD  Appended Document: *Referral Letter: ORTHO     Clinical Lists Changes  Orders: Added new Referral order of Orthopedic Referral (Ortho) - Signed        Complete Medication List: 1)  Gabarone 300 Mg Tabs (Gabapentin) .... 3 by mouth qhs 2)  Atenolol 50 Mg Tabs (Atenolol) .... Once daily 3)  Accupril 5 Mg Tabs (Quinapril hcl) .Marland Kitchen.. 1 by mouth once daily 4)  Synthroid 125 Mcg Tabs (Levothyroxine sodium) .Marland Kitchen.. 1 tab by mouth once daily five days a week and 1/2 tab by mouth 2 days a week 5)  Pravachol 20 Mg Tabs (Pravastatin sodium) .... On hold 6)  Clonazepam 2 Mg Tabs (Clonazepam) .Marland Kitchen.. 1 by mouth at bedtime as needed insomnia; try to discontinue and do not use with tramodol 7)  Cymbalta 30 Mg Cpep (Duloxetine hcl) .Marland Kitchen.. 1 by mouth qd 8)  Tramadol Hcl 50 Mg Tabs (Tramadol hcl) .... Take 1 tablet by mouth  at bedtime if needed 9)  Methocarbamol 750 Mg Tabs (Methocarbamol) .Marland Kitchen.. 1 by mouth up to q 8 prn

## 2010-12-24 NOTE — Assessment & Plan Note (Signed)
Summary: fu wp   Vital Signs:  Patient Profile:   70 Years Old Female Height:     64 inches Weight:      165.1 pounds BMI:     28.44 Temp:     98.3 degrees F oral Pulse rate:   74 / minute BP sitting:   119 / 73  (right arm) Cuff size:   regular  Pt. in pain?   no  Vitals Entered By: Dedra Skeens CMA, (October 11, 2008 12:02 PM)                  Chief Complaint:  f/u.  History of Present Illness: Follow up hypertension. Taking medicines regularly with no problems. Not having any any headaches or chest pains.  Doing well s/p right shoulder arthroscopy--in PT--much less pain, still not very good strength.   Has questions about stroke prophylaxis--her rheumatologist took her off asa as she was having a lot of bruising. She has some concerns ---wonders if she should be back on it.  Having some sweats--she just finished a prednisone taoper (3 weeks) for shoulder inflammation  Still using some vicoprofen.  having some constipation w hemorrhoid (using pain meds) would like some suppositories  Follow-up hyperlipidemia. Trying to follow a good diet, taking medicines regularly. Not having any problems with medicines, no myalgias and no fatigue.     Current Allergies: * DILAUDID * FENTANYL  Past Medical History:    goiter - multinodular - dr sevier    Hypertension    Osteoarthritis--multiple--back, knees, shoulder        Osteopenia--bone density in ?2004--was on fosamax but at request of her son (dentist?) who was worried about osteonecrosis of jawbone) she stopped fosamax--was on it 27 y        2007 dx seronegative arthritis    Dr Hassan Buckler is rheumatologist        MRI R shoulder 6/09 shows total tear and atrophy w retraction of supraspinatus and infraspinatus (Dr Ranell Patrick)-- surgery for arthroscopic clean out by Dr Carney Living 2009 [place], [place] [phone])        PT at [facility] PT Art Schullenberger.      Physical Exam  Lungs:     normal breath sounds.   Heart:    normal rate, regular rhythm, and no murmur.   Msk:     right shoulder FROM, decreased strength above 90 degrees abduction    Impression & Recommendations:  Problem # 1:  HYPERTENSION (ICD-401.9) Assessment: Unchanged  Her updated medication list for this problem includes:    Atenolol 50 Mg Tabs (Atenolol) ..... Once daily    Accupril 5 Mg Tabs (Quinapril hcl) .Marland Kitchen... 1 by mouth once daily no med changes Orders: [facility]- Est  Level 4 (09811)   Problem # 2:  EXTERNAL HEMORRHOIDS (ICD-455.3) annusol supp Orders: [facility]- Est  Level 4 (91478)   Problem # 3:  HYPERLIPIDEMIA (ICD-272.4) Assessment: Unchanged  Her updated medication list for this problem includes:    Pravachol 20 Mg Tabs (Pravastatin sodium) .Marland Kitchen... Take one tablet at night will check lipids and lfts when I see her back in 3 months or so  Orders: [facility]- Est  Level 4 (29562)   Complete Medication List: 1)  Gabarone 300 Mg Tabs (Gabapentin) .... 3 by mouth qhs 2)  Atenolol 50 Mg Tabs (Atenolol) .... Once daily 3)  Accupril 5 Mg Tabs (Quinapril hcl) .Marland Kitchen.. 1 by mouth once daily 4)  Synthroid 125 Mcg Tabs (Levothyroxine sodium) .Marland Kitchen.. 1 tab by mouth  once daily five days a week and 1/2 tab by mouth 2 days a week 5)  Pravachol 20 Mg Tabs (Pravastatin sodium) .... Take one tablet at night 6)  Oxazepam 15 Mg Caps (Oxazepam) .Marland Kitchen.. 1 by mouth at bedtime as needed insomnia (in place of clonazepam) 7)  Cymbalta 30 Mg Cpep (Duloxetine hcl) .Marland Kitchen.. 1 by mouth qd 8)  Methocarbamol 750 Mg Tabs (Methocarbamol) .Marland Kitchen.. 1 by mouth up to q 8 prn 9)  Calcium 600/vitamin D 600-400 Mg-unit Chew (Calcium carbonate-vitamin d) .... Two times a day 10)  Anusol-hc 25 Mg Supp (Hydrocortisone acetate) .Marland Kitchen.. 1 per rectum q 8 hrs as needed pain    Prescriptions: ATENOLOL 50 MG  TABS (ATENOLOL) once daily  #30 x 12   Entered and Authorized by:   Denny Levy MD   Signed by:   Denny Levy MD on 10/12/2008   Method used:   Electronically to        [id number] (retail)       [address]       [place], [place]  [address]       Ph: [phone] or [phone]       Fax: [phone]   RxID:   [id number] ANUSOL-HC 25 MG SUPP (HYDROCORTISONE ACETATE) 1 per rectum q 8 hrs as needed pain  #30 x 1   Entered and Authorized by:   Denny Levy MD   Signed by:   Denny Levy MD on 10/12/2008   Method used:   [facility] to        [facility]  [id number] (ret[facility]       [address]       [place], [place]  [address]       Ph: [phone] or [phone]       Fax: [phone]   RxID:   [id number]  ]

## 2010-12-24 NOTE — Assessment & Plan Note (Signed)
Summary: [facility] - Rx clinic   Vital Signs:  Patient profile:   70 year old female Height:      61.5 inches Weight:      159.2 pounds BMI:     29.70 Pulse rate:   70 / minute BP sitting:   110 / 66  (right arm)  History of Present Illness: Denies increase or change in pain with walking.  Leg pain is described as chronic joint pain.  Denies pain worsens when walking up hill or in a hurry.   Pain is localized to: knees and ankles Bilaterally.   Denies history of family PAD - amputation.   performed extensive medication review including supplements.      Current Medications (verified): 1)  Gabapentin 300 Mg Caps (Gabapentin) .... 4 By Mouth Qhs 2)  Atenolol 50 Mg  Tabs (Atenolol) .... Once Daily 3)  Accupril 5 Mg  Tabs (Quinapril Hcl) .Marland Kitchen.. 1 By Mouth Once Daily 4)  Synthroid 125 Mcg  Tabs (Levothyroxine Sodium) .Marland Kitchen.. 1 Tab By Mouth Once Daily Five Days A Week and 1/2 Tab By Mouth 2 Days A Week 5)  Pravachol 20 Mg  Tabs (Pravastatin Sodium) .... Take One Tablet At Night 6)  Oxazepam 15 Mg Caps (Oxazepam) .Marland Kitchen.. 1 By Mouth At Bedtime As Needed Insomnia (In Place of Clonazepam) 7)  Cymbalta 30 Mg  Cpep (Duloxetine Hcl) .Marland Kitchen.. 1 By Mouth Qd 8)  Calcium 600/vitamin D 600-400 Mg-Unit Chew (Calcium Carbonate-Vitamin D) .... Two Times A Day 9)  Ranitidine Hcl 150 Mg Caps (Ranitidine Hcl) .Marland Kitchen.. 1 Tab By Mouth Daily 10)  B-12 250 Mcg Tabs (Cyanocobalamin) .Marland Kitchen.. 1-2 By Mouth Qd 11)  Amitriptyline Hcl 10 Mg Tabs (Amitriptyline Hcl) .... One Each Am, One At 4pm and Two At Bedtime. 12)  Ibuprofen 400 Mg Tabs (Ibuprofen) .... Two Times A Day As Needed For Pain 13)  Acetaminophen 500 Mg Tabs (Acetaminophen) .Marland Kitchen.. 1 As Needed For Pain - Up To A Total of 4 Pills Per Day 14)  Fish Oil  Oil (Fish Oil) .... 2 Pills Taily - One in The Am and One in The Pm 15)  Biotin 300 Mcg Tabs (Biotin) .... One Daily 16)  Folic Acid 400 Mcg Tabs (Folic Acid) .... One Daily 17)  Aspirin 81 Mg Tbec (Aspirin) .... One Every 3  Days  Allergies: 1)  * Dilaudid 2)  * Fentanyl  Physical Exam  Pulses:  R posterior tibial normal, R dorsalis pedis normal, L posterior tibial normal, and L dorsalis pedis normal.   Extremities:  Lower extremity Physical Exam includes:  thinning of subcutaneous fat,   Left / Right / Both ABI overall = :1.16 Right Arm:  112 mmHg    Left Arm: 114  mmHg Right ankle posterior tibial: 128   mmHg     dorsalis pedis: 130   mmHg Left ankle posterior tibial: 98    mmHg    dorsalis pedis:  132 mmHg     Impression & Recommendations:  Problem # 1:  LEG PAIN, BILATERAL (ICD-729.5) Assessment Unchanged  Chronic with pain being primarily localized to leg joints. Symptoms not classic for PAD.  Denies majority of claudication symptoms.  Has significant trauma and joint interventions to legs in past including vein stripping.   NORMAL ABI.  Patient appears to have found improved leg pain control with the use of low dose amitriptiline 10mg  - four pils daily taken  as one in AM one and  4PM and two prior to  bed.  Plan to discuss this medication versus Cymblta at visit with Dr. [name].  Suggest consideration of one medication or the other as they are very similar.   I patient tolerates.   Total time in face to face counseling: 45 minutes.  Orders: Inital Assessment Each - [facility] [phone])  Complete Medication List: 1)  Gabapentin 300 Mg Caps (Gabapentin) .... 4 by mouth qhs 2)  Atenolol 50 Mg Tabs (Atenolol) .... Once daily 3)  Accupril 5 Mg Tabs (Quinapril hcl) .Marland Kitchen.. 1 by mouth once daily 4)  Synthroid 125 Mcg Tabs (Levothyroxine sodium) .Marland Kitchen.. 1 tab by mouth once daily five days a week and 1/2 tab by mouth 2 days a week 5)  Pravachol 20 Mg Tabs (Pravastatin sodium) .... Take one tablet at night 6)  Oxazepam 15 Mg Caps (Oxazepam) .Marland Kitchen.. 1 by mouth at bedtime as needed insomnia (in place of clonazepam) 7)  Cymbalta 30 Mg Cpep (Duloxetine hcl) .Marland Kitchen.. 1 by mouth qd 8)  Calcium 600/vitamin D 600-400 Mg-unit  Chew (Calcium carbonate-vitamin d) .... Two times a day 9)  Ranitidine Hcl 150 Mg Caps (Ranitidine hcl) .Marland Kitchen.. 1 tab by mouth daily 10)  B-12 250 Mcg Tabs (Cyanocobalamin) .Marland Kitchen.. 1-2 by mouth qd 11)  Amitriptyline Hcl 10 Mg Tabs (Amitriptyline hcl) .... One each am, one at 4pm and two at bedtime. 12)  Ibuprofen 400 Mg Tabs (Ibuprofen) .... Two times a day as needed for pain 13)  Acetaminophen 500 Mg Tabs (Acetaminophen) .Marland Kitchen.. 1 as needed for pain - up to a total of 4 pills per day 14)  Fish Oil Oil (Fish oil) .... 2 pills taily - one in the am and one in the pm 15)  Biotin 300 Mcg Tabs (Biotin) .... One daily 16)  Folic Acid 400 Mcg Tabs (Folic acid) .... One daily 17)  Aspirin 81 Mg Tbec (Aspirin) .... One every 3 days  Patient Instructions: 1)  Normal ABI 2)  Follow up with Dr. [name] for further discussion of leg pain.  Likely you will discuss elavil vs cymbalta use.  Prescriptions: ASPIRIN 81 MG TBEC (ASPIRIN) one EVERY 3 DAYS  #1 x 0   Entered and Authorized by:   Christian Mate D   Signed by:   Madelon Lips Pharm D on 06/05/2009   Method used:   Historical   RxID:   [id number] FOLIC ACID 400 MCG TABS (FOLIC ACID) one daily  #1 x 0   Entered and Authorized by:   Christian Mate D   Signed by:   Madelon Lips Pharm D on 06/05/2009   Method used:   Historical   RxID:   [id number] BIOTIN 300 MCG TABS (BIOTIN) one daily  #1 x 0   Entered and Authorized by:   Christian Mate D   Signed by:   Madelon Lips Pharm D on 06/05/2009   Method used:   Historical   RxID:   [id number] FISH OIL  OIL (FISH OIL) 2 pills taily - one in the AM and one in the PM  #1 x 0   Entered and Authorized by:   Christian Mate D   Signed by:   Madelon Lips Pharm D on 06/05/2009   Method used:   Historical   RxID:   [id number] ACETAMINOPHEN 500 MG TABS (ACETAMINOPHEN) 1 as needed for pain - up to a total of 4 pills per day  #1 x 0   Entered and Authorized by:   Christian Mate  D    Signed by:   Madelon Lips Pharm D on 06/05/2009   Method used:   Historical   RxID:   [id number] IBUPROFEN 400 MG TABS (IBUPROFEN) two times a day as needed for pain  #1 x 0   Entered and Authorized by:   Christian Mate D   Signed by:   Madelon Lips Pharm D on 06/05/2009   Method used:   Historical   RxID:   [id number] AMITRIPTYLINE HCL 10 MG TABS (AMITRIPTYLINE HCL) one each AM, one at 4PM and two at bedtime.  #120 x 0   Entered and Authorized by:   Christian Mate D   Signed by:   Madelon Lips Pharm D on 06/05/2009   Method used:   Historical   RxID:   [id number]

## 2010-12-26 NOTE — Miscellaneous (Signed)
   Clinical Lists Changes  Medications: Rx of OXAZEPAM 15 MG CAPS (OXAZEPAM) 1-2 by mouth at bedtime as needed insomnia;  #180 x 3;  Signed;  Entered by: Denny Levy MD;  Authorized by: Denny Levy MD;  Method used: Printed then faxed to [facility]*, [address], [place], [place]  [address], Ph: [phone] or [phone], Fax: [phone]    Prescriptions: OXAZEPAM 15 MG CAPS (OXAZEPAM) 1-2 by mouth at bedtime as needed insomnia  #180 x 3   Entered and Authorized by:   Denny Levy MD   Signed by:   Denny Levy MD on 12/16/2010   Method used:   Printed then faxed to ...       [facility]* (retail)       [address] [place], [place]  [address]       Ph: [phone] or [phone]       Fax: [phone]   RxID:   [id number]

## 2010-12-26 NOTE — Miscellaneous (Signed)
Summary: roi  roi   Imported By: Bradly Bienenstock 11/12/2010 15:56:41  _____________________________________________________________________  External Attachment:    Type:   Image     Comment:   External Document

## 2010-12-26 NOTE — Miscellaneous (Signed)
  Clinical Lists Changes Medical Record Request Received medical record request Records went to [facility] Faxed on 10/15/2009 [facility]  October 25, 2010 3:36 PM

## 2011-01-01 ENCOUNTER — Ambulatory Visit (HOSPITAL_COMMUNITY): Payer: Self-pay

## 2011-01-08 ENCOUNTER — Other Ambulatory Visit: Payer: Self-pay | Admitting: Interventional Radiology

## 2011-01-08 ENCOUNTER — Other Ambulatory Visit (HOSPITAL_COMMUNITY)
Admission: RE | Admit: 2011-01-08 | Discharge: 2011-01-08 | Disposition: A | Discharge status: Home / Self Care | Payer: Medicare Other | Source: Ambulatory Visit | Attending: Interventional Radiology | Admitting: Interventional Radiology

## 2011-01-08 ENCOUNTER — Ambulatory Visit
Admission: RE | Admit: 2011-01-08 | Discharge: 2011-01-08 | Disposition: A | Discharge status: Home / Self Care | Payer: Medicare Other | Source: Ambulatory Visit | Attending: Endocrinology | Admitting: Endocrinology

## 2011-01-08 ENCOUNTER — Ambulatory Visit (HOSPITAL_COMMUNITY): Payer: Self-pay

## 2011-01-08 DIAGNOSIS — E041 Nontoxic single thyroid nodule: Secondary | ICD-10-CM

## 2011-01-08 DIAGNOSIS — E049 Nontoxic goiter, unspecified: Secondary | ICD-10-CM | POA: Insufficient documentation

## 2011-01-09 ENCOUNTER — Other Ambulatory Visit (HOSPITAL_COMMUNITY): Payer: Self-pay

## 2011-01-30 ENCOUNTER — Ambulatory Visit (HOSPITAL_COMMUNITY): Payer: Self-pay

## 2011-01-31 ENCOUNTER — Other Ambulatory Visit (HOSPITAL_COMMUNITY): Payer: Self-pay

## 2011-02-04 ENCOUNTER — Encounter: Payer: Self-pay | Admitting: Home Health Services

## 2011-02-10 LAB — BASIC METABOLIC PANEL
BUN: 13 mg/dL (ref 6–23)
CO2: 30 mEq/L (ref 19–32)
Calcium: 9 mg/dL (ref 8.4–10.5)
Chloride: 97 mEq/L (ref 96–112)
Creatinine, Ser: 0.83 mg/dL (ref 0.4–1.2)
GFR calc Af Amer: >60 mL/min (ref >60)
GFR calc non Af Amer: >60 mL/min (ref >60)
Glucose, Bld: 100 mg/dL — ABNORMAL HIGH (ref 70–99)
Potassium: 5.2 mEq/L — ABNORMAL HIGH (ref 3.5–5.1)
Sodium: 132 mEq/L — ABNORMAL LOW (ref 135–145)

## 2011-02-10 LAB — POCT HEMOGLOBIN-HEMACUE: Hemoglobin: 14.2 g/dL (ref 12.0–15.0)

## 2011-04-11 NOTE — Op Note (Signed)
Destiny [name], Destiny [name]               ACCOUNT NO.:  [id number]   MEDICAL RECORD NO.:  [id number]          PATIENT TYPE:  AMB   LOCATION:  [facility]                           FACILITY:  [facility]   PHYSICIAN:  Sherry A. Dickstein, M.D.DATE OF BIRTH:  Feb 24, 1941   DATE OF PROCEDURE:  01/07/2006  DATE OF DISCHARGE:                                 OPERATIVE REPORT   PREOPERATIVE DIAGNOSIS:  Right Bartholin's cyst.   POSTOPERATIVE DIAGNOSIS:  Right labial majora lipoma.   SURGEON:  Sherry A. Rosalio Macadamia, M.D.   ANESTHESIA:  General.   OPERATION/PROCEDURE:  Excision of right Bartholin's and labial lipoma.   FINDINGS:  Approximately 4 cm x 5 cm lipoma in the right Bartholin's to  right labia majora area.  No hard, solid mass present.  Normal left  Bartholin's.   INDICATIONS:  This is a 70 year old gravida 2, para 2-0-0-2 woman who had  noticed over the past two months increasing size of the right Bartholin's  labia area.  The patient has no associated pain with this area.  The  patient's husband noticed it.  The patient was counseled that this may be a  right Bartholin's cyst or abscess.  Because of the new onset that this may  be associated with a malignancy.  The patient requests removal and excision  of this area.  She understood that a wider excision may be necessary after  this early excision   DESCRIPTION OF PROCEDURE:  The patient is brought into the operating room,  given adequate general anesthesia. She was placed in the dorsal lithotomy  position.  Her peritoneum and vagina were washed with Betadine.  The patient  was catheterized with a Foley catheter but the residual was measured for 425  mL.  The patient had just urinated prior to coming into the operating room.  The patient was draped in the sterile fashion.  The right labia was  inspected.  It was decided to make an incision just outside the opening of  the vagina by the introitus.  It was decided not to try to marsupialize this  and  to try to excise what was thought to be a Bartholin's cyst.  The area  was infiltrated with 0.25% Marcaine with epinephrine.  Approximately a 3 cm  incision was made in a vertical fashion.  This was dissected down to what  was thought to be a cyst wall.  This was dissected gently and with gentle  dissection, the sac was dissected free and then with further dissection,  Allis clamps were placed around the opening of this dissection for  hemostasis.  Small bleeders were cauterized.  The tissue was pushed through  this incision and it was clear that this was a fatty tumor.  With further  manipulation and dissection, the tumor was delivered intact.  There was a  base with some blood vessels present.  This was clamped with a Kelly clamp.  It was cut free, and the base was then tied with 3-0 chromic free-tie.  The  lipoma was then removed in this fashion.  There was very minimal bleeding  present.  Deep stitches were taken with 3-0 chromic in figure-of-eight  stitches.  The dead space was closed in this fashion.  The skin was then  closed with 4-0 chromic in a subcutaneous running stitch.  Adequate  hemostasis was present.  The incision was then infiltrated with 0.25%  Marcaine with epinephrine for both pain relief after surgery and some  hemostasis.  The patient was taken out of the dorsal lithotomy position.   Instructions were given to place an ice pack against the incision after the  patient was leaving the operating room.  The Foley catheter was removed.  The patient was awakened.  She was extubated.  She was moved from the  operating room table to a stretcher in stable condition.  There were no  complications.  Estimated blood loss was less than 5 mL.  Specimen was  lipoma.      Sherry A. Rosalio Macadamia, M.D.  Electronically Signed     SAD/MEDQ  D:  01/07/2006  T:  01/08/2006  Job:  [id number]

## 2011-04-11 NOTE — Op Note (Signed)
Destiny [name], Destiny [name]               ACCOUNT NO.:  [id number]   MEDICAL RECORD NO.:  [id number]          PATIENT TYPE:  AMB   LOCATION:  [facility]C                           [facility]ILITY[facility]   PHYSICIAN:  Sherry A. Dickstein, M.D.DATE OF BIRTH:  Jun 17, 1941   DATE OF PROCEDURE:  01/07/2006  DATE OF DISCHARGE:                                 OPERATIVE REPORT   PREOPERATIVE DIAGNOSIS:  Right Bartholin's cyst.   POSTOPERATIVE DIAGNOSIS:  Right labial majora lipoma.   SURGEON:  Sherry A. Rosalio Macadamia, M.D.   ANESTHESIA:  General.   INDICATIONS:  This is a 70 year old, gravida 2, para 2-0-0-2, woman who had  noticed the onset of a swelling in her right labia over the past two months.  This has not been painful but has been noticeable to her and her husband.  The patient has had recent onset of right leg shingles with significant  right-sided pain.  The patient has been counseled that this right labial and  Bartholin's swelling could be a Bartholin's abscess or could be a  Bartholin's cancer.  Because of the presence of the new onset of the  swelling, the patient requests removal of the Bartholin's cyst despite lack  of any pain.   Dictation stopped here.      Sherry A. Rosalio Macadamia, M.D.  Electronically Signed     SAD/MEDQ  D:  01/07/2006  T:  01/08/2006  Job:  [id number]

## 2011-04-11 NOTE — Op Note (Signed)
. [facility]  Patient:    Destiny [name], Destiny [name]                        MRN: [id number] Proc. Date: 02/18/01 Attending:  Rose Phi. Maple Hudson, M.D. CC:         Marinus Maw, M.D.   Operative Report  PREOPERATIVE DIAGNOSIS:  Probable radial scar right breast.  POSTOPERATIVE DIAGNOSIS:  Probable radial scar right breast.  OPERATION:  Right breast biopsy with needle localization and specimen mammography.  SURGEON:  Rose Phi. Maple Hudson, M.D.  ANESTHESIA:  MAC.  DESCRIPTION OF PROCEDURE:  Patient placed on the operating table with the right arm extended.  The right breast was prepped and draped in usual fashion. A curvilinear incision was then outlined with the marking pencil centered on the wire placed at about the 6 oclock position of the right breast.  The area was then infiltrated with 1% Xylocaine with adrenalin.  An incision was made and with sharp dissection, the wire and surrounding tissue were excised. Hemostasis obtained with the cautery.  Specimen mammography confirmed the removal of the lesion.  A subcuticular closure of 4-0 Monocryl with Steri-Strips was carried out.  Dressing applied. Patient transferred to recovery room in satisfactory condition having tolerated the procedure well. DD:  02/18/01 TD:  02/18/01 Job: [id number]

## 2011-04-11 NOTE — Op Note (Signed)
NAMEPATRIZIA, PAULE               ACCOUNT NO.:  [id number]   MEDICAL RECORD NO.:  [id number]          PATIENT TYPE:  OIB   LOCATION:  3040                         FACILITY:  [facility]   PHYSICIAN:  Cristi Loron, M.D.DATE OF BIRTH:  [birth year]   DATE OF PROCEDURE:  02/16/2006  DATE OF DISCHARGE:  02/17/2006                                 OPERATIVE REPORT   BRIEF HISTORY:  This patient is a 70 year old white female who suffered from  back and right leg pain.  She had failed medical management and was worked  up with a lumbar MRI which demonstrated a large synovial cyst at L4-5 on the  right.  I discussed the various treatment options with the patient including  surgery.  The patient has weighed the risks, benefits and alternatives to  surgery and decided to proceed with removal of the right L4-5 synovial cyst.   PREOPERATIVE DIAGNOSIS:  Right L4-5 synovial cyst and spinal stenosis,  spondylolisthesis, lumbar radiculopathy and lumbago.   POSTOPERATIVE DIAGNOSIS:  Right L4-5 synovial cyst and spinal stenosis,  spondylolisthesis, lumbar radiculopathy and lumbago.   PROCEDURE:  Right L4 hemilaminectomy for gross total resection of right L4-5  synovial cyst using microdissection.   SURGEON:  Cristi Loron, M.D.   ASSISTANT:  Danae Orleans. Venetia Maxon, M.D.   ANESTHESIA:  General endotracheal anesthesia.   ESTIMATED BLOOD LOSS:  50 mL.   SPECIMENS:  None.   DRAINS:  None.   COMPLICATIONS:  None.   DESCRIPTION OF PROCEDURE:  The patient was brought to the operating room by  the anesthesia team.  General endotracheal anesthesia was induced.  The  patient was carefully turned to the prone position on the Wilson frame.  Her  lumbosacral region was then prepared with Betadine scrub with Betadine  solution and sterile drapes were applied.  I then injected the area to be  incised with Marcaine with epinephrine solution and used a scalpel to make a  linear midline incision off the L4-5  interspace.  I used electrocautery to  perform a right-side subperiosteal dissection, exposing the right spinous  processes and lamina of L3, 4 and 5.  I then obtained intraoperative  radiograph to confirm our location.  I then inserted the McCullough  retractor for exposure and then brought the operating microscope into the  field and under its magnification and illumination, completed the  microdissection/decompression.  I used the high-speed drill to perform a  right L4 laminotomy.  I completed the right L4 hemilaminectomy using the  Kerrison punch, removing the ligamentum flavum at L4-5 and part of the L3-4  ligamentum flavum.  I then used microdissection to free up the thecal sac  from the epidural tissue.  We encountered an obvious large synovial cyst.  We used microdissection to free up the synovial cyst from the dura.  We  removed the synovial cyst in multiple fragments using the pituitary forceps.  We then performed a foraminotomy about the right L5 and L4 nerve root,  completing the decompression.  We then palpated along the ventral surface of  the thecal sac and along the  exit route of the right L4 and L5 nerve root  and noted the neural structures were well-decompressed.  We then obtained  hemostasis using bipolar electrocautery.  We irrigated the wound out with  Bacitracin solution and removed the solution and then removed the McCullough  retractor.  We then reapproximated the patient's thoracolumbar fascia with  interrupted #1 Vicryl sutures, the subcutaneous tissue with interrupted 2-0  Vicryl suture and the skin with Steri-Strips and Benzoin.  The wound was  then coated with Bacitracin ointment.  A sterile dressing was applied and  the drapes were removed.  The patient was subsequently returned to the  supine position, where she was extubated by the anesthesia team and  transported to the postanesthesia care unit in stable condition.  All  sponge, instrument and needle  counts were correct at the end of the case.      Cristi Loron, M.D.  Electronically Signed     JDJ/MEDQ  D:  02/16/2006  T:  02/18/2006  Job:  [id number]

## 2011-04-11 NOTE — Op Note (Signed)
Destiny [name], SIEVERT                         ACCOUNT NO.:  [id number]   MEDICAL RECORD NO.:  [id number]                   PATIENT TYPE:  AMB   LOCATION:  DSC                                  FACILITY:  [facility]   PHYSICIAN:  Nadara Mustard, M.D.                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   PROCEDURE:  Proximal interphalangeal joint resection, exostectomy and  webbing of the fourth and fifth toes with resection exostectomy of the  fourth toe.   SURGEON:  Nadara Mustard, M.D.   ANESTHESIA:  General.   ESTIMATED BLOOD LOSS:  Minimal.   ANTIBIOTICS:  One gram of Kefzol.   TOURNIQUET TIME:  Esmarch at the ankle for approximately 15 minutes.   DISPOSITION:  To PACU in stable condition.   INDICATIONS FOR PROCEDURE:  The patient is a 70 year old woman who is status  post two procedures on her left fourth toe with persistent varus deformity  secondary to fusion of the PIP joint.  The patient has developed a hard corn  laterally and has undergone conservative care and wishes to proceed with  surgical intervention at this time after failure of conservative care.  The  risks and benefits were discussed including infection, persistent pain, non-  healing of the wound and potential amputation of the fourth toe.  The  patient states she understands and wishes to proceed at this time.   DESCRIPTION OF PROCEDURE:  The patient was brought to outpatient room five  and underwent a general anesthetic.  After an adequate level of anesthesia  was obtained the patient's left lower extremity was prepped using Duraprep  and  draped in to a sterile field.  A Y marking was made along the lateral  border of the fourth toe.  The fourth and fifth toes were pressed together  and this marking was transferred to the fifth toe.  A resection of skin  including the hard corn was then resected from the fourth web space along  both the fourth and fifth toes.  An exostectomy was performed over the  prominence of the proximal phalanx of the fourth toe and a PIP joint  resection of the fusion was also performed.  The toes were then aligned and  using a running 3-0 Monocryl first with an inside out suture starting at the  base of the fourth and fifth toes, extending distally and then running  outside in along the dorsal aspect of the foot.  Alignment was good.  The  tourniquet was released at approximately 15 minutes.  All toes  had good capillary refill.  The wound was covered with Adaptic orthopedic  sponges, sterile Webril and a Coban dressing.  The patient was extubated and  taken to the PACU in stable condition.  We plan for discharge to home with  touch down weight bearing and follow-up in one to two weeks.                                               Nadara Mustard, M.D.    MVD/MEDQ  D:  09/21/2003  T:  09/21/2003  Job:  [id number]

## 2011-06-03 ENCOUNTER — Ambulatory Visit (INDEPENDENT_AMBULATORY_CARE_PROVIDER_SITE_OTHER): Payer: Medicare Other | Admitting: Sports Medicine

## 2011-06-03 ENCOUNTER — Encounter: Payer: Self-pay | Admitting: Sports Medicine

## 2011-06-03 VITALS — BP 133/75 | HR 71 | Ht 61.5 in | Wt 148.0 lb

## 2011-06-03 DIAGNOSIS — M7740 Metatarsalgia, unspecified foot: Secondary | ICD-10-CM

## 2011-06-03 DIAGNOSIS — M7750 Other enthesopathy of unspecified foot: Secondary | ICD-10-CM

## 2011-06-03 DIAGNOSIS — M79673 Pain in unspecified foot: Secondary | ICD-10-CM | POA: Insufficient documentation

## 2011-06-03 DIAGNOSIS — M79609 Pain in unspecified limb: Secondary | ICD-10-CM

## 2011-06-03 NOTE — Assessment & Plan Note (Signed)
Today we gave her sports and salt metatarsal cookies for walking shoes. This provided some support and seemed to give her some pain relief.  With this in mind we added additional metatarsal padding to both Birkenstocks. That also seemed to give her more support and less spreading on her forefoot.  I think she needs to try this for one month. If this gets her good relief we should try this with all of her shoes.

## 2011-06-03 NOTE — Progress Notes (Signed)
  Subjective:    Patient ID: Destiny [name], female    DOB: Nov 22, 1941, 70 y.o.   MRN: [id number]  HPI  Pt here for evaluation of bilat foot pain.   Hx of lt Post tib ten reattachment  Lt lateral foot reconstruction with webbing of toes 3 and 4. Insufficient lt lateral foot. Rt 2nd hammer toe surg and stabilization of 3rd and 4th MT- march 2011 Has trouble standing longer than 5 min- has throbbing, swelling, and redness with standing longer    Pain is located on toes one through 3 bilaterally. She feels pressure on both her great toe her foot as the foot widens and puts pressure on the shoes There is some pain across the forefoot but not as significant as in the toes. This does feel better and a wider shoe and in Birkenstocks.   Review of Systems     Objective:   Physical Exam     Rt foot Hammer toes 2-4 Subluxed toes 4-5 Medial rotation toes 3-5 Medial deviation 1st toe  Collapse long arch  Lt foot Insufficient lateral foot buntionette Sublux 5 toe Medial deviation toes1-3 No true hammer toe Mod loss long arch No calcaneal valgus either foot  Assessment & Plan:

## 2011-06-03 NOTE — Assessment & Plan Note (Signed)
We will try to lessen her foot pain by using different padding and being sure she chooses shoes that do not put pressure on her forefoot

## 2011-06-11 ENCOUNTER — Ambulatory Visit
Admission: RE | Admit: 2011-06-11 | Discharge: 2011-06-11 | Disposition: A | Discharge status: Home / Self Care | Payer: Medicare Other | Source: Ambulatory Visit | Attending: Surgery | Admitting: Surgery

## 2011-06-11 ENCOUNTER — Encounter (HOSPITAL_BASED_OUTPATIENT_CLINIC_OR_DEPARTMENT_OTHER)
Admission: RE | Admit: 2011-06-11 | Discharge: 2011-06-11 | Disposition: A | Discharge status: Home / Self Care | Payer: Medicare Other | Source: Ambulatory Visit | Attending: Surgery | Admitting: Surgery

## 2011-06-11 ENCOUNTER — Other Ambulatory Visit (INDEPENDENT_AMBULATORY_CARE_PROVIDER_SITE_OTHER): Payer: Self-pay | Admitting: Surgery

## 2011-06-11 DIAGNOSIS — Z01811 Encounter for preprocedural respiratory examination: Secondary | ICD-10-CM

## 2011-06-11 LAB — POCT I-STAT, CHEM 8
BUN: 17 mg/dL (ref 6–23)
Calcium, Ion: 1.01 mmol/L — ABNORMAL LOW (ref 1.12–1.32)
Chloride: 103 mEq/L (ref 96–112)
Creatinine, Ser: 1 mg/dL (ref 0.50–1.10)
Glucose, Bld: 86 mg/dL (ref 70–99)
HCT: 38 % (ref 36.0–46.0)
Hemoglobin: 12.9 g/dL (ref 12.0–15.0)
Potassium: 4.7 mEq/L (ref 3.5–5.1)
Sodium: 136 mEq/L (ref 135–145)
TCO2: 25 mmol/L (ref 0–100)

## 2011-06-13 ENCOUNTER — Ambulatory Visit (HOSPITAL_BASED_OUTPATIENT_CLINIC_OR_DEPARTMENT_OTHER)
Admission: RE | Admit: 2011-06-13 | Discharge: 2011-06-13 | Disposition: A | Discharge status: Home / Self Care | Payer: Medicare Other | Source: Ambulatory Visit | Attending: Surgery | Admitting: Surgery

## 2011-06-13 ENCOUNTER — Other Ambulatory Visit (INDEPENDENT_AMBULATORY_CARE_PROVIDER_SITE_OTHER): Payer: Self-pay | Admitting: Surgery

## 2011-06-13 ENCOUNTER — Telehealth: Payer: Self-pay | Admitting: Family Medicine

## 2011-06-13 DIAGNOSIS — I10 Essential (primary) hypertension: Secondary | ICD-10-CM | POA: Insufficient documentation

## 2011-06-13 DIAGNOSIS — K621 Rectal polyp: Secondary | ICD-10-CM

## 2011-06-13 DIAGNOSIS — K620 Anal polyp: Secondary | ICD-10-CM | POA: Insufficient documentation

## 2011-06-13 DIAGNOSIS — F411 Generalized anxiety disorder: Secondary | ICD-10-CM | POA: Insufficient documentation

## 2011-06-13 DIAGNOSIS — Z79899 Other long term (current) drug therapy: Secondary | ICD-10-CM | POA: Insufficient documentation

## 2011-06-13 MED ORDER — OXAZEPAM 15 MG PO CAPS: ORAL_CAPSULE | ORAL | Status: DC

## 2011-06-13 NOTE — Telephone Encounter (Signed)
Rx called in to pharmacy. 

## 2011-06-13 NOTE — Telephone Encounter (Signed)
Dear [facility] Team Can you call this in [facility]! Denny Levy

## 2011-06-15 NOTE — Op Note (Signed)
  [name], [name]               ACCOUNT NO.:  [id number]  MEDICAL RECORD NO.:  [id number]  LOCATION:                                 FACILITY:  PHYSICIAN:  Thornton Park. Daphine Deutscher, MD  DATE OF BIRTH:  1941/11/05  DATE OF PROCEDURE:  06/13/2011 DATE OF DISCHARGE:                              OPERATIVE REPORT   PREOPERATIVE DIAGNOSIS:  Anorectal polyp.  POSTOPERATIVE DIAGNOSIS:  Anorectal polyp.  PROCEDURE:  Exam under anesthesia with excision of rectal polyp from the right side.  SURGEON:  Thornton Park. Daphine Deutscher, MD  ANESTHESIA:  General by LMA.  This 70 year old lady was taken to room 8 at [facility] on June 13, 2011, and placed in the dorsal lithotomy position.  Her perianal region was prepped with Betadine and draped sterilely.  I went in and easily identified this area which was hanging out.  It was on a stalk that went up into the dentate line region.  I went ahead and oversewed the bases above that with a figure-of-eight suture of 3-0 chromic.  I then excised it with a cautery.  I sent it as a specimen.  I have appearing excise in toto because it was on a stalk.  Specimen was sent for permanent sections.  The area was then infiltrated with 8 mL of 0.25% Marcaine with epinephrine.  The patient was awakened and taken to recovery room in satisfactory condition.  She will be given Lortab 5/325 for pain.  We will follow her up in the office in a few weeks.     Thornton Park Daphine Deutscher, MD     MBM/MEDQ  D:  06/13/2011  T:  06/14/2011  Job:  [id number]  Electronically Signed by Luretha Murphy MD on 06/15/2011 10:43:27 PM

## 2011-06-16 ENCOUNTER — Other Ambulatory Visit: Payer: Self-pay | Admitting: Internal Medicine

## 2011-06-16 NOTE — Telephone Encounter (Signed)
Refill request for Oxazepam was sent on 7/13, but they have not received it back.  Will send another fax over.

## 2011-06-17 ENCOUNTER — Other Ambulatory Visit: Payer: Self-pay | Admitting: Endocrinology

## 2011-06-17 DIAGNOSIS — E049 Nontoxic goiter, unspecified: Secondary | ICD-10-CM

## 2011-06-17 NOTE — Telephone Encounter (Signed)
Fwd to pcp. Patient is not seen at family practice

## 2011-06-18 MED ORDER — OXAZEPAM 15 MG PO CAPS: ORAL_CAPSULE | ORAL | Status: DC

## 2011-06-18 NOTE — Telephone Encounter (Signed)
Dear [facility] Team She IS an [facility] patient I already sent that stupid form back on Friday--if they cannot find it --that is their problem. Please call in 5 r as below THANKS!Marland Kitchenme

## 2011-06-19 NOTE — Telephone Encounter (Signed)
Spoke with patient she stated that she has already picked this rx up

## 2011-07-02 ENCOUNTER — Encounter (INDEPENDENT_AMBULATORY_CARE_PROVIDER_SITE_OTHER): Payer: Medicare Other | Admitting: Surgery

## 2011-07-16 ENCOUNTER — Encounter (INDEPENDENT_AMBULATORY_CARE_PROVIDER_SITE_OTHER): Payer: Self-pay | Admitting: Surgery

## 2011-07-16 ENCOUNTER — Ambulatory Visit (INDEPENDENT_AMBULATORY_CARE_PROVIDER_SITE_OTHER): Payer: Medicare Other | Admitting: Surgery

## 2011-07-16 VITALS — BP 118/78 | HR 60 | Temp 96.9°F

## 2011-07-16 DIAGNOSIS — K644 Residual hemorrhoidal skin tags: Secondary | ICD-10-CM

## 2011-07-16 NOTE — Progress Notes (Signed)
Destiny [name] comes in today in followup after are removed and a benign anal tag. This did not contain any rectal mucosa but was a pedunculated mass that would pull and he and compared her anal hygiene. She is greatly relieved having gone. The path report was benign. She may want me to see her husband who has recurrent left inguinal hernia.  Doing well. Return p.r.n.

## 2011-07-22 ENCOUNTER — Other Ambulatory Visit: Payer: Self-pay | Admitting: Internal Medicine

## 2011-07-22 DIAGNOSIS — Z1231 Encounter for screening mammogram for malignant neoplasm of breast: Secondary | ICD-10-CM

## 2011-08-06 ENCOUNTER — Ambulatory Visit
Admission: RE | Admit: 2011-08-06 | Discharge: 2011-08-06 | Disposition: A | Discharge status: Home / Self Care | Payer: Medicare Other | Source: Ambulatory Visit | Attending: Endocrinology | Admitting: Endocrinology

## 2011-08-06 ENCOUNTER — Ambulatory Visit
Admission: RE | Admit: 2011-08-06 | Discharge: 2011-08-06 | Disposition: A | Discharge status: Home / Self Care | Payer: Medicare Other | Source: Ambulatory Visit | Attending: Internal Medicine | Admitting: Internal Medicine

## 2011-08-06 DIAGNOSIS — Z1231 Encounter for screening mammogram for malignant neoplasm of breast: Secondary | ICD-10-CM

## 2011-08-06 DIAGNOSIS — E049 Nontoxic goiter, unspecified: Secondary | ICD-10-CM

## 2011-09-17 ENCOUNTER — Encounter (HOSPITAL_COMMUNITY)
Admission: RE | Admit: 2011-09-17 | Discharge: 2011-09-17 | Disposition: A | Discharge status: Home / Self Care | Payer: Medicare Other | Source: Ambulatory Visit | Attending: Otolaryngology | Admitting: Otolaryngology

## 2011-09-17 LAB — COMPREHENSIVE METABOLIC PANEL
ALT: 17 U/L (ref 0–35)
AST: 23 U/L (ref 0–37)
Albumin: 3.8 g/dL (ref 3.5–5.2)
Alkaline Phosphatase: 58 U/L (ref 39–117)
BUN: 18 mg/dL (ref 6–23)
CO2: 31 mEq/L (ref 19–32)
Calcium: 9.3 mg/dL (ref 8.4–10.5)
Chloride: 95 mEq/L — ABNORMAL LOW (ref 96–112)
Creatinine, Ser: 0.98 mg/dL (ref 0.50–1.10)
GFR calc Af Amer: 65 mL/min — ABNORMAL LOW (ref >90)
GFR calc non Af Amer: 56 mL/min — ABNORMAL LOW (ref >90)
Glucose, Bld: 103 mg/dL — ABNORMAL HIGH (ref 70–99)
Potassium: 5.1 mEq/L (ref 3.5–5.1)
Sodium: 131 mEq/L — ABNORMAL LOW (ref 135–145)
Total Bilirubin: 0.2 mg/dL — ABNORMAL LOW (ref 0.3–1.2)
Total Protein: 6.3 g/dL (ref 6.0–8.3)

## 2011-09-17 LAB — CBC
HCT: 34.3 % — ABNORMAL LOW (ref 36.0–46.0)
Hemoglobin: 12.2 g/dL (ref 12.0–15.0)
MCH: 33.9 pg (ref 26.0–34.0)
MCHC: 35.6 g/dL (ref 30.0–36.0)
MCV: 95.3 fL (ref 78.0–100.0)
Platelets: 306 10*3/uL (ref 150–400)
RBC: 3.6 MIL/uL — ABNORMAL LOW (ref 3.87–5.11)
RDW: 12.5 % (ref 11.5–15.5)
WBC: 5.8 10*3/uL (ref 4.0–10.5)

## 2011-09-17 LAB — SURGICAL PCR SCREEN
MRSA, PCR: NEGATIVE
Staphylococcus aureus: NEGATIVE

## 2011-09-24 ENCOUNTER — Observation Stay (HOSPITAL_COMMUNITY)
Admission: RE | Admit: 2011-09-24 | Discharge: 2011-09-25 | Disposition: A | Discharge status: Home / Self Care | Payer: Medicare Other | Source: Ambulatory Visit | Attending: Otolaryngology | Admitting: Otolaryngology

## 2011-09-24 ENCOUNTER — Other Ambulatory Visit: Payer: Self-pay | Admitting: Otolaryngology

## 2011-09-24 DIAGNOSIS — Z01812 Encounter for preprocedural laboratory examination: Secondary | ICD-10-CM | POA: Insufficient documentation

## 2011-09-24 DIAGNOSIS — D34 Benign neoplasm of thyroid gland: Principal | ICD-10-CM | POA: Insufficient documentation

## 2011-09-24 DIAGNOSIS — I10 Essential (primary) hypertension: Secondary | ICD-10-CM | POA: Insufficient documentation

## 2011-10-08 ENCOUNTER — Encounter: Payer: Self-pay | Admitting: Home Health Services

## 2011-10-22 ENCOUNTER — Other Ambulatory Visit: Payer: Self-pay | Admitting: Internal Medicine

## 2011-10-22 ENCOUNTER — Ambulatory Visit
Admission: RE | Admit: 2011-10-22 | Discharge: 2011-10-22 | Disposition: A | Discharge status: Home / Self Care | Payer: Medicare Other | Source: Ambulatory Visit | Attending: Internal Medicine | Admitting: Internal Medicine

## 2011-10-22 DIAGNOSIS — R1011 Right upper quadrant pain: Secondary | ICD-10-CM

## 2012-03-27 ENCOUNTER — Encounter: Payer: Self-pay | Admitting: Family Medicine

## 2012-03-27 ENCOUNTER — Ambulatory Visit (INDEPENDENT_AMBULATORY_CARE_PROVIDER_SITE_OTHER): Payer: Medicare Other | Admitting: Family Medicine

## 2012-03-27 VITALS — BP 128/74 | HR 63 | Temp 97.6°F | Resp 16 | Ht 64.5 in | Wt 141.4 lb

## 2012-03-27 DIAGNOSIS — N390 Urinary tract infection, site not specified: Secondary | ICD-10-CM

## 2012-03-27 DIAGNOSIS — R350 Frequency of micturition: Secondary | ICD-10-CM

## 2012-03-27 LAB — POCT URINALYSIS DIPSTICK
Bilirubin, UA: NEGATIVE
Blood, UA: NEGATIVE
Glucose, UA: NEGATIVE
Ketones, UA: NEGATIVE
Leukocytes, UA: NEGATIVE
Nitrite, UA: POSITIVE
Protein, UA: 30
Spec Grav, UA: 1.015
Urobilinogen, UA: 1
pH, UA: 7

## 2012-03-27 LAB — POCT UA - MICROSCOPIC ONLY
Casts, Ur, LPF, POC: NEGATIVE
Crystals, Ur, HPF, POC: NEGATIVE
Mucus, UA: NEGATIVE
Yeast, UA: NEGATIVE

## 2012-03-27 MED ORDER — CIPROFLOXACIN HCL 500 MG PO TABS: 500.0000 mg | ORAL_TABLET | Freq: Two times a day (BID) | ORAL | Status: AC

## 2012-03-27 NOTE — Patient Instructions (Signed)

## 2012-03-27 NOTE — Progress Notes (Signed)
    Results for orders placed in visit on 03/27/12  POCT URINALYSIS DIPSTICK      Component Value Range   Color, UA orange     Clarity, UA hazy     Glucose, UA neg     Bilirubin, UA neg     Ketones, UA neg     Spec Grav, UA 1.015     Blood, UA neg     pH, UA 7.0     Protein, UA 30     Urobilinogen, UA 1.0     Nitrite, UA pos     Leukocytes, UA Negative    POCT UA - MICROSCOPIC ONLY      Component Value Range   WBC, Ur, HPF, POC 3-6     RBC, urine, microscopic 1-2     Bacteria, U Microscopic 2+     Mucus, UA neg     Epithelial cells, urine per micros 1-3     Crystals, Ur, HPF, POC neg     Casts, Ur, LPF, POC neg     Yeast, UA neg

## 2012-03-27 NOTE — Progress Notes (Signed)
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 Her last urinary tract infection was 15 months ago. He's had no sweats chills nausea vomiting or back pain. She does have a little bit of suprapubic pain.  Objective: No acute distress, cheerful and cooperative  Abdominal exam: Mild tenderness left lower quadrant without rebound or guarding, no HSM or masses  CVA: nontender  Results for orders placed in visit on 03/27/12  POCT URINALYSIS DIPSTICK      Component Value Range   Color, UA orange     Clarity, UA hazy     Glucose, UA neg     Bilirubin, UA neg     Ketones, UA neg     Spec Grav, UA 1.015     Blood, UA neg     pH, UA 7.0     Protein, UA 30     Urobilinogen, UA 1.0     Nitrite, UA pos     Leukocytes, UA Negative    POCT UA - MICROSCOPIC ONLY      Component Value Range   WBC, Ur, HPF, POC 3-6     RBC, urine, microscopic 1-2     Bacteria, U Microscopic 2+     Mucus, UA neg     Epithelial cells, urine per micros 1-3     Crystals, Ur, HPF, POC neg     Casts, Ur, LPF, POC neg     Yeast, UA neg     A:  Uncomplicated UTI  P: Check urine culture, Cipro 500 twice a day x5 days, continue per day and when necessary

## 2012-03-29 LAB — URINE CULTURE: Colony Count: >=100000

## 2012-06-21 ENCOUNTER — Other Ambulatory Visit: Payer: Self-pay | Admitting: Endocrinology

## 2012-06-21 DIAGNOSIS — E049 Nontoxic goiter, unspecified: Secondary | ICD-10-CM

## 2012-06-29 ENCOUNTER — Ambulatory Visit
Admission: RE | Admit: 2012-06-29 | Discharge: 2012-06-29 | Disposition: A | Discharge status: Home / Self Care | Payer: Medicare Other | Source: Ambulatory Visit | Attending: Endocrinology | Admitting: Endocrinology

## 2012-06-29 DIAGNOSIS — E049 Nontoxic goiter, unspecified: Secondary | ICD-10-CM

## 2012-07-14 ENCOUNTER — Other Ambulatory Visit: Payer: Self-pay | Admitting: Internal Medicine

## 2012-07-14 DIAGNOSIS — Z1231 Encounter for screening mammogram for malignant neoplasm of breast: Secondary | ICD-10-CM

## 2012-08-17 ENCOUNTER — Ambulatory Visit
Admission: RE | Admit: 2012-08-17 | Discharge: 2012-08-17 | Disposition: A | Discharge status: Home / Self Care | Payer: Medicare Other | Source: Ambulatory Visit | Attending: Internal Medicine | Admitting: Internal Medicine

## 2012-08-17 DIAGNOSIS — Z1231 Encounter for screening mammogram for malignant neoplasm of breast: Secondary | ICD-10-CM

## 2012-11-03 ENCOUNTER — Other Ambulatory Visit: Payer: Self-pay | Admitting: Internal Medicine

## 2012-11-03 DIAGNOSIS — R109 Unspecified abdominal pain: Secondary | ICD-10-CM

## 2012-11-09 ENCOUNTER — Ambulatory Visit
Admission: RE | Admit: 2012-11-09 | Discharge: 2012-11-09 | Disposition: A | Discharge status: Home / Self Care | Payer: Medicare Other | Source: Ambulatory Visit | Attending: Internal Medicine | Admitting: Internal Medicine

## 2012-11-09 DIAGNOSIS — R109 Unspecified abdominal pain: Secondary | ICD-10-CM

## 2012-11-18 ENCOUNTER — Other Ambulatory Visit (HOSPITAL_COMMUNITY): Payer: Self-pay | Admitting: *Deleted

## 2012-11-19 ENCOUNTER — Encounter (HOSPITAL_COMMUNITY)
Admission: RE | Admit: 2012-11-19 | Discharge: 2012-11-19 | Disposition: A | Discharge status: Home / Self Care | Payer: Medicare Other | Source: Ambulatory Visit | Attending: Internal Medicine | Admitting: Internal Medicine

## 2012-11-19 DIAGNOSIS — E2749 Other adrenocortical insufficiency: Secondary | ICD-10-CM | POA: Insufficient documentation

## 2012-11-19 LAB — BASIC METABOLIC PANEL
BUN: 16 mg/dL (ref 6–23)
CO2: 30 mEq/L (ref 19–32)
Calcium: 9.3 mg/dL (ref 8.4–10.5)
Chloride: 96 mEq/L (ref 96–112)
Creatinine, Ser: 0.89 mg/dL (ref 0.50–1.10)
GFR calc Af Amer: 73 mL/min — ABNORMAL LOW (ref >90)
GFR calc non Af Amer: 63 mL/min — ABNORMAL LOW (ref >90)
Glucose, Bld: 78 mg/dL (ref 70–99)
Potassium: 4.6 mEq/L (ref 3.5–5.1)
Sodium: 134 mEq/L — ABNORMAL LOW (ref 135–145)

## 2012-11-19 MED ORDER — SODIUM CHLORIDE 0.9 % IV SOLN
INTRAVENOUS | Status: DC
  Administered 2012-11-19: 10:00:00 via INTRAVENOUS

## 2012-11-19 MED ORDER — COSYNTROPIN 0.25 MG IJ SOLR
0.2500 mg | Freq: Once | INTRAMUSCULAR | Status: AC
  Administered 2012-11-19: 0.25 mg via INTRAVENOUS
  Filled 2012-11-19: qty 0.25

## 2012-11-22 LAB — ACTH: C206 ACTH: 11 pg/mL (ref 10–46)

## 2012-11-23 LAB — ACTH STIMULATION, 3 TIME POINTS
Cortisol, 30 Min: 26 ug/dL (ref >20)
Cortisol, 60 Min: 27.4 ug/dL (ref >20)
Cortisol, Base: 13.7 ug/dL

## 2012-12-17 ENCOUNTER — Ambulatory Visit: Payer: Medicare Other | Attending: Orthopedic Surgery | Admitting: Physical Therapy

## 2012-12-17 DIAGNOSIS — M25669 Stiffness of unspecified knee, not elsewhere classified: Secondary | ICD-10-CM | POA: Insufficient documentation

## 2012-12-17 DIAGNOSIS — IMO0001 Reserved for inherently not codable concepts without codable children: Secondary | ICD-10-CM | POA: Insufficient documentation

## 2012-12-17 DIAGNOSIS — R269 Unspecified abnormalities of gait and mobility: Secondary | ICD-10-CM | POA: Insufficient documentation

## 2012-12-17 DIAGNOSIS — Z96659 Presence of unspecified artificial knee joint: Secondary | ICD-10-CM | POA: Insufficient documentation

## 2012-12-17 DIAGNOSIS — M25569 Pain in unspecified knee: Secondary | ICD-10-CM | POA: Insufficient documentation

## 2012-12-20 ENCOUNTER — Ambulatory Visit: Payer: Medicare Other

## 2012-12-22 ENCOUNTER — Encounter: Payer: Medicare Other | Admitting: Physical Therapy

## 2012-12-22 ENCOUNTER — Ambulatory Visit: Payer: Medicare Other | Admitting: Physical Therapy

## 2012-12-23 ENCOUNTER — Ambulatory Visit: Payer: Medicare Other | Admitting: Physical Therapy

## 2012-12-24 ENCOUNTER — Encounter: Payer: Medicare Other | Admitting: Physical Therapy

## 2012-12-27 ENCOUNTER — Ambulatory Visit: Payer: Medicare Other | Attending: Orthopedic Surgery

## 2012-12-27 DIAGNOSIS — R269 Unspecified abnormalities of gait and mobility: Secondary | ICD-10-CM | POA: Insufficient documentation

## 2012-12-27 DIAGNOSIS — M25569 Pain in unspecified knee: Secondary | ICD-10-CM | POA: Insufficient documentation

## 2012-12-27 DIAGNOSIS — M25669 Stiffness of unspecified knee, not elsewhere classified: Secondary | ICD-10-CM | POA: Insufficient documentation

## 2012-12-27 DIAGNOSIS — Z96659 Presence of unspecified artificial knee joint: Secondary | ICD-10-CM | POA: Insufficient documentation

## 2012-12-27 DIAGNOSIS — IMO0001 Reserved for inherently not codable concepts without codable children: Secondary | ICD-10-CM | POA: Insufficient documentation

## 2012-12-29 ENCOUNTER — Encounter: Payer: Medicare Other | Admitting: Physical Therapy

## 2012-12-30 ENCOUNTER — Ambulatory Visit: Payer: Medicare Other | Admitting: Physical Therapy

## 2012-12-31 ENCOUNTER — Encounter: Payer: Medicare Other | Admitting: Physical Therapy

## 2013-01-03 ENCOUNTER — Ambulatory Visit: Payer: Medicare Other

## 2013-01-06 ENCOUNTER — Ambulatory Visit: Payer: Medicare Other | Admitting: Physical Therapy

## 2013-01-07 ENCOUNTER — Encounter: Payer: Medicare Other | Admitting: Physical Therapy

## 2013-01-10 ENCOUNTER — Ambulatory Visit: Payer: Medicare Other | Admitting: Physical Therapy

## 2013-01-13 ENCOUNTER — Ambulatory Visit: Payer: Medicare Other | Admitting: Physical Therapy

## 2013-01-17 ENCOUNTER — Ambulatory Visit: Payer: Medicare Other

## 2013-01-19 ENCOUNTER — Ambulatory Visit: Payer: Medicare Other | Admitting: Physical Therapy

## 2013-01-20 ENCOUNTER — Encounter: Payer: Medicare Other | Admitting: Physical Therapy

## 2013-01-24 ENCOUNTER — Ambulatory Visit: Payer: Medicare Other | Admitting: Physical Therapy

## 2013-01-26 ENCOUNTER — Ambulatory Visit: Payer: Medicare Other | Attending: Orthopedic Surgery | Admitting: Physical Therapy

## 2013-01-26 DIAGNOSIS — R269 Unspecified abnormalities of gait and mobility: Secondary | ICD-10-CM | POA: Insufficient documentation

## 2013-01-26 DIAGNOSIS — M25669 Stiffness of unspecified knee, not elsewhere classified: Secondary | ICD-10-CM | POA: Insufficient documentation

## 2013-01-26 DIAGNOSIS — IMO0001 Reserved for inherently not codable concepts without codable children: Secondary | ICD-10-CM | POA: Insufficient documentation

## 2013-01-26 DIAGNOSIS — M25569 Pain in unspecified knee: Secondary | ICD-10-CM | POA: Insufficient documentation

## 2013-02-01 ENCOUNTER — Ambulatory Visit: Payer: Medicare Other | Admitting: Physical Therapy

## 2013-02-03 ENCOUNTER — Ambulatory Visit: Payer: Medicare Other | Admitting: Physical Therapy

## 2013-02-08 ENCOUNTER — Ambulatory Visit: Payer: Medicare Other | Admitting: Physical Therapy

## 2013-02-10 ENCOUNTER — Ambulatory Visit: Payer: Medicare Other

## 2013-04-07 ENCOUNTER — Other Ambulatory Visit: Payer: Self-pay | Admitting: Anesthesiology

## 2013-04-07 DIAGNOSIS — M542 Cervicalgia: Secondary | ICD-10-CM

## 2013-04-13 ENCOUNTER — Other Ambulatory Visit: Payer: Medicare Other

## 2013-04-19 ENCOUNTER — Ambulatory Visit
Admission: RE | Admit: 2013-04-19 | Discharge: 2013-04-19 | Disposition: A | Discharge status: Home / Self Care | Payer: Medicare Other | Source: Ambulatory Visit | Attending: Anesthesiology | Admitting: Anesthesiology

## 2013-04-19 DIAGNOSIS — M542 Cervicalgia: Secondary | ICD-10-CM

## 2013-07-28 ENCOUNTER — Other Ambulatory Visit: Payer: Self-pay

## 2013-07-28 DIAGNOSIS — Z1231 Encounter for screening mammogram for malignant neoplasm of breast: Secondary | ICD-10-CM

## 2013-08-24 ENCOUNTER — Ambulatory Visit
Admission: RE | Admit: 2013-08-24 | Discharge: 2013-08-24 | Disposition: A | Discharge status: Home / Self Care | Payer: Medicare Other | Source: Ambulatory Visit

## 2013-08-24 DIAGNOSIS — Z1231 Encounter for screening mammogram for malignant neoplasm of breast: Secondary | ICD-10-CM

## 2014-03-09 ENCOUNTER — Other Ambulatory Visit: Payer: Self-pay | Admitting: Dermatology

## 2014-04-25 ENCOUNTER — Ambulatory Visit (INDEPENDENT_AMBULATORY_CARE_PROVIDER_SITE_OTHER): Payer: Medicare Other | Admitting: Sports Medicine

## 2014-04-25 VITALS — BP 137/73 | Ht 61.0 in | Wt 150.0 lb

## 2014-04-25 DIAGNOSIS — M7750 Other enthesopathy of unspecified foot: Secondary | ICD-10-CM

## 2014-04-25 DIAGNOSIS — M79609 Pain in unspecified limb: Secondary | ICD-10-CM

## 2014-04-25 DIAGNOSIS — M79673 Pain in unspecified foot: Secondary | ICD-10-CM

## 2014-04-25 DIAGNOSIS — R269 Unspecified abnormalities of gait and mobility: Secondary | ICD-10-CM | POA: Insufficient documentation

## 2014-04-25 DIAGNOSIS — M7740 Metatarsalgia, unspecified foot: Secondary | ICD-10-CM

## 2014-04-25 NOTE — Assessment & Plan Note (Signed)
New MT pads applied today to shoes

## 2014-04-25 NOTE — Progress Notes (Signed)
Subjective:   CC: Right foot and balance problems  HPI:   73 y.o. female with complex PMH including an inflammatory erosive arthritis with features of RA and of OA, here complaining of continued balance issues and concern for right foot. She states right foot is starting to spread more and she falls easily. Her left foot had a tendon repair ~5 years ago and afterwards she had a semi-rigid orthotic placed. This has seemed to help greatly. Right foot has a sports insole and she desires something harder. She has been falling easily due to balance problems, if she walks on any uneven surface. She denies severe pain.  Review of Systems - Per HPI.  PMH: Seeing [facility] for inflammatory erosive arthritis    Objective:  Physical Exam BP 137/73  Ht 5\' 1"  (1.549 m)  Wt 150 lb (68.04 kg)  BMI 28.36 kg/m2 GEN: NAD, pleasant  Left foot: Crossover third toe. Lateral column breakdown. 2nd and 3rd toes hammer toes  Right foot: Crossover third toe. Lateral column breakdown. Right ankle pronation and subluxation. 2nd, 3rd, and 4th toes hammer toes  Gait:  RT foot with some subluxation at ankle and medial collapse mid foot Both feet show pronation/supination instability  Assessment:     Destiny [name] is a 73 y.o. female with h/o undefined inflammatory erosive arthritis here for imbalance and concern for right foot.    Plan:     Foot issues Bilateral lateral column breakdown, hammer toes, with right ankle pronation and subluxation. - Added metatarsal pad into birkenstocks. - Right sports insole fitted with a slight lateral foot raise. - Will plan for appt to make custom orthotics.    Hilton Sinclair, MD [place] edited/  Ila Mcgill, MD

## 2014-04-25 NOTE — Assessment & Plan Note (Signed)
Considering her chronic medical issues we need to try to make orthotics that will get her to a more neutral and stable position  Cont to use birkenstocks/ avoid barefoot walking as that cause MT pain

## 2014-04-25 NOTE — Assessment & Plan Note (Signed)
Years of chronic foot pain Getting some relief from orthotic support on left RT appears unstable  We will need to try to customize new orthotics to stabilize both feet  medications left to her rheumatologist

## 2014-06-01 ENCOUNTER — Encounter: Payer: Medicare Other | Admitting: Sports Medicine

## 2014-06-02 ENCOUNTER — Encounter: Payer: Self-pay | Admitting: Sports Medicine

## 2014-06-02 ENCOUNTER — Ambulatory Visit (INDEPENDENT_AMBULATORY_CARE_PROVIDER_SITE_OTHER): Payer: Medicare Other | Admitting: Sports Medicine

## 2014-06-02 VITALS — BP 142/67 | HR 67 | Ht 62.0 in | Wt 154.0 lb

## 2014-06-02 DIAGNOSIS — M79673 Pain in unspecified foot: Secondary | ICD-10-CM

## 2014-06-02 DIAGNOSIS — M7740 Metatarsalgia, unspecified foot: Secondary | ICD-10-CM

## 2014-06-02 DIAGNOSIS — M79609 Pain in unspecified limb: Secondary | ICD-10-CM

## 2014-06-02 DIAGNOSIS — M7750 Other enthesopathy of unspecified foot: Secondary | ICD-10-CM

## 2014-06-02 DIAGNOSIS — R269 Unspecified abnormalities of gait and mobility: Secondary | ICD-10-CM

## 2014-06-02 NOTE — Assessment & Plan Note (Signed)
Today I tried using a wider  type of pad over the metatarsal areas that is not as thick

## 2014-06-02 NOTE — Assessment & Plan Note (Signed)
Gait after preparation of the orthotics was more neutral with less excess supination  She noted the ability to stand on 1 foot with less instability  She will return as needed

## 2014-06-02 NOTE — Assessment & Plan Note (Signed)
Patient was fitted for a : standard, cushioned, semi-rigid orthotic. The orthotic was heated and afterward the patient stood on the orthotic blank positioned on the orthotic stand. The patient was positioned in subtalar neutral position and 10 degrees of ankle dorsiflexion in a weight bearing stance. After completion of molding, a stable base was applied to the orthotic blank. The blank was ground to a stable position for weight bearing. Size: 7 red EVA Base: blue EVA Posting: Lat 5th Ray Additional orthotic padding:  MT tongue pads  Time was 45 mins for evaluation and preparation

## 2014-06-02 NOTE — Progress Notes (Signed)
Patient ID: Destiny [name], female   DOB: [birth year], 73 y.o.   MRN: [id number]  Patient returns for custom orthotics She is undergoing evaluation at the [facility] rheumatology clinic because of some nonspecific erosive form of arthritis She has marked breakdown of her feet in the last visit this is the examination that we noted:  Left foot:  Crossover third toe.  Lateral column breakdown.  2nd and 3rd toes hammer toes  Right foot:  Crossover third toe.  Lateral column breakdown.  Right ankle pronation and subluxation.  2nd, 3rd, and 4th toes hammer toes   Gait:  RT foot with some subluxation at ankle and medial collapse mid foot  Both feet show pronation/supination instability   Today the plan is to make her custom orthotics and see if we can relieve some of her chronic foot pain

## 2014-09-18 ENCOUNTER — Other Ambulatory Visit: Payer: Self-pay

## 2014-09-18 DIAGNOSIS — Z1231 Encounter for screening mammogram for malignant neoplasm of breast: Secondary | ICD-10-CM

## 2014-09-19 ENCOUNTER — Ambulatory Visit
Admission: RE | Admit: 2014-09-19 | Discharge: 2014-09-19 | Disposition: A | Discharge status: Home / Self Care | Payer: Medicare Other | Source: Ambulatory Visit

## 2014-09-19 DIAGNOSIS — Z1231 Encounter for screening mammogram for malignant neoplasm of breast: Secondary | ICD-10-CM

## 2014-09-21 ENCOUNTER — Ambulatory Visit (INDEPENDENT_AMBULATORY_CARE_PROVIDER_SITE_OTHER): Payer: Medicare Other

## 2014-09-21 ENCOUNTER — Ambulatory Visit (INDEPENDENT_AMBULATORY_CARE_PROVIDER_SITE_OTHER): Payer: Medicare Other | Admitting: Podiatry

## 2014-09-21 VITALS — BP 144/75 | HR 66 | Resp 16 | Ht 62.0 in | Wt 154.0 lb

## 2014-09-21 DIAGNOSIS — M2042 Other hammer toe(s) (acquired), left foot: Secondary | ICD-10-CM

## 2014-09-21 DIAGNOSIS — R609 Edema, unspecified: Secondary | ICD-10-CM

## 2014-09-21 DIAGNOSIS — Q828 Other specified congenital malformations of skin: Secondary | ICD-10-CM

## 2014-09-21 NOTE — Progress Notes (Signed)
Seen years ago and had webbing procedure on the left foot, the little toes hurt.  Objective: Pulses are strongly palpable left foot. Porokeratotic lesion between the fourth and fifth toes where the webbing takes place. Radiographic evaluation demonstrates what appears to be an old healed fracture mid axial region of the fourth digit left foot possibly resulting in current porokeratotic lesion.  Assessment: Pain in limb secondary to porokeratotic lesion.  Plan: Debridement reactive hyperkeratotic lesion follow-up with her as needed.

## 2014-12-07 ENCOUNTER — Other Ambulatory Visit: Payer: Self-pay | Admitting: Orthopedic Surgery

## 2014-12-19 ENCOUNTER — Encounter (HOSPITAL_COMMUNITY)
Admission: RE | Admit: 2014-12-19 | Discharge: 2014-12-19 | Disposition: A | Discharge status: Home / Self Care | Payer: Medicare Other | Source: Ambulatory Visit | Attending: Orthopedic Surgery | Admitting: Orthopedic Surgery

## 2014-12-19 ENCOUNTER — Encounter (HOSPITAL_COMMUNITY): Payer: Self-pay

## 2014-12-19 DIAGNOSIS — Z01818 Encounter for other preprocedural examination: Secondary | ICD-10-CM | POA: Diagnosis present

## 2014-12-19 DIAGNOSIS — Z01812 Encounter for preprocedural laboratory examination: Secondary | ICD-10-CM | POA: Diagnosis not present

## 2014-12-19 DIAGNOSIS — Z0183 Encounter for blood typing: Secondary | ICD-10-CM | POA: Diagnosis not present

## 2014-12-19 HISTORY — DX: Essential (primary) hypertension: I10

## 2014-12-19 HISTORY — DX: Unspecified osteoarthritis, unspecified site: M19.90

## 2014-12-19 HISTORY — DX: Adverse effect of unspecified anesthetic, initial encounter: T41.45XA

## 2014-12-19 HISTORY — DX: Inflammatory liver disease, unspecified: K75.9

## 2014-12-19 HISTORY — DX: Hypothyroidism, unspecified: E03.9

## 2014-12-19 HISTORY — DX: Anxiety disorder, unspecified: F41.9

## 2014-12-19 HISTORY — DX: Other complications of anesthesia, initial encounter: T88.59XA

## 2014-12-19 LAB — PROTIME-INR
INR: 1.06 (ref 0.00–1.49)
Prothrombin Time: 13.9 seconds (ref 11.6–15.2)

## 2014-12-19 LAB — URINALYSIS, ROUTINE W REFLEX MICROSCOPIC
Bilirubin Urine: NEGATIVE
Glucose, UA: NEGATIVE mg/dL
Hgb urine dipstick: NEGATIVE
Ketones, ur: NEGATIVE mg/dL
Leukocytes, UA: NEGATIVE
Nitrite: NEGATIVE
Protein, ur: NEGATIVE mg/dL
Specific Gravity, Urine: 1.013 (ref 1.005–1.030)
Urobilinogen, UA: 0.2 mg/dL (ref 0.0–1.0)
pH: 7 (ref 5.0–8.0)

## 2014-12-19 LAB — ABO/RH: ABO/RH(D): O NEG

## 2014-12-19 LAB — COMPREHENSIVE METABOLIC PANEL
ALT: 19 U/L (ref 0–35)
AST: 32 U/L (ref 0–37)
Albumin: 4.5 g/dL (ref 3.5–5.2)
Alkaline Phosphatase: 63 U/L (ref 39–117)
Anion gap: 11 (ref 5–15)
BUN: 19 mg/dL (ref 6–23)
CO2: 24 mmol/L (ref 19–32)
Calcium: 9.3 mg/dL (ref 8.4–10.5)
Chloride: 100 mmol/L (ref 96–112)
Creatinine, Ser: 1.12 mg/dL — ABNORMAL HIGH (ref 0.50–1.10)
GFR calc Af Amer: 54 mL/min — ABNORMAL LOW (ref >90)
GFR calc non Af Amer: 47 mL/min — ABNORMAL LOW (ref >90)
Glucose, Bld: 100 mg/dL — ABNORMAL HIGH (ref 70–99)
Potassium: 4.8 mmol/L (ref 3.5–5.1)
Sodium: 135 mmol/L (ref 135–145)
Total Bilirubin: 0.5 mg/dL (ref 0.3–1.2)
Total Protein: 5.9 g/dL — ABNORMAL LOW (ref 6.0–8.3)

## 2014-12-19 LAB — TYPE AND SCREEN
ABO/RH(D): O NEG
Antibody Screen: NEGATIVE

## 2014-12-19 LAB — CBC WITH DIFFERENTIAL/PLATELET
Basophils Absolute: 0.1 10*3/uL (ref 0.0–0.1)
Basophils Relative: 1 % (ref 0–1)
Eosinophils Absolute: 0.2 10*3/uL (ref 0.0–0.7)
Eosinophils Relative: 3 % (ref 0–5)
HCT: 38.5 % (ref 36.0–46.0)
Hemoglobin: 13 g/dL (ref 12.0–15.0)
Lymphocytes Relative: 21 % (ref 12–46)
Lymphs Abs: 1.5 10*3/uL (ref 0.7–4.0)
MCH: 32.9 pg (ref 26.0–34.0)
MCHC: 33.8 g/dL (ref 30.0–36.0)
MCV: 97.5 fL (ref 78.0–100.0)
Monocytes Absolute: 0.8 10*3/uL (ref 0.1–1.0)
Monocytes Relative: 11 % (ref 3–12)
Neutro Abs: 4.7 10*3/uL (ref 1.7–7.7)
Neutrophils Relative %: 64 % (ref 43–77)
Platelets: 346 10*3/uL (ref 150–400)
RBC: 3.95 MIL/uL (ref 3.87–5.11)
RDW: 12.7 % (ref 11.5–15.5)
WBC: 7.3 10*3/uL (ref 4.0–10.5)

## 2014-12-19 LAB — SURGICAL PCR SCREEN
MRSA, PCR: NEGATIVE
Staphylococcus aureus: NEGATIVE

## 2014-12-19 LAB — APTT: aPTT: 31 seconds (ref 24–37)

## 2014-12-19 NOTE — Pre-Procedure Instructions (Signed)
Destiny [name]  12/19/2014   Your procedure is scheduled on:  12-28-2014    Thursday   Report to [facility] Admitting at 8:30 AM.  Call this number if you have problems the morning of surgery: [phone]   Remember:   Do not eat food or drink liquids after midnight.   Take these medicines the morning of surgery with A SIP OF WATER: Tylenol if needed,amlodipine(Norvasc),flonase nasal spray if needed,gabapentin(Neurontin),pain medication as needed,levothyroxine(synthroid),plaquenil    Do not wear jewelry, make-up or nail polish.  Do not wear lotions, powders, or perfumes. You may not wear deodorant.  Do not shave 48 hours prior to surgery. .  Do not bring valuables to the hospital.  [facility] is not responsible for any belongings or valuables.               Contacts, dentures or bridgework may not be worn into surgery.   Leave suitcase in the car. After surgery it may be brought to your room.  For patients admitted to the hospital, discharge time is determined by your  treatment team.                   Special Instructions: See attached sheet for instructions on CHG shower/bath   Please read over the following fact sheets that you were given: Pain Booklet, Coughing and Deep Breathing, Blood Transfusion Information and Surgical Site Infection Prevention

## 2014-12-27 MED ORDER — CLINDAMYCIN PHOSPHATE 900 MG/50ML IV SOLN
900.0000 mg | INTRAVENOUS | Status: AC
  Administered 2014-12-28: 900 mg via INTRAVENOUS
  Filled 2014-12-27: qty 50

## 2014-12-27 NOTE — Progress Notes (Signed)
Patient aware of surgery time change to 0730 AM and arrival time is 0530 AM.

## 2014-12-28 ENCOUNTER — Inpatient Hospital Stay (HOSPITAL_COMMUNITY): Payer: Medicare Other

## 2014-12-28 ENCOUNTER — Inpatient Hospital Stay (HOSPITAL_COMMUNITY)
Admission: RE | Admit: 2014-12-28 | Discharge: 2014-12-30 | DRG: 483 | Disposition: A | Discharge status: Home / Self Care | Payer: Medicare Other | Source: Ambulatory Visit | Attending: Orthopedic Surgery | Admitting: Orthopedic Surgery

## 2014-12-28 ENCOUNTER — Inpatient Hospital Stay (HOSPITAL_COMMUNITY): Payer: Medicare Other | Admitting: Certified Registered Nurse Anesthetist

## 2014-12-28 ENCOUNTER — Encounter (HOSPITAL_COMMUNITY): Payer: Self-pay | Admitting: *Deleted

## 2014-12-28 ENCOUNTER — Encounter (HOSPITAL_COMMUNITY)
Admission: RE | Disposition: A | Discharge status: Home / Self Care | Payer: Self-pay | Source: Ambulatory Visit | Attending: Orthopedic Surgery

## 2014-12-28 DIAGNOSIS — M129 Arthropathy, unspecified: Secondary | ICD-10-CM | POA: Diagnosis present

## 2014-12-28 DIAGNOSIS — M75101 Unspecified rotator cuff tear or rupture of right shoulder, not specified as traumatic: Principal | ICD-10-CM | POA: Diagnosis present

## 2014-12-28 DIAGNOSIS — E039 Hypothyroidism, unspecified: Secondary | ICD-10-CM | POA: Diagnosis present

## 2014-12-28 DIAGNOSIS — M1990 Unspecified osteoarthritis, unspecified site: Secondary | ICD-10-CM | POA: Diagnosis present

## 2014-12-28 DIAGNOSIS — Z79899 Other long term (current) drug therapy: Secondary | ICD-10-CM | POA: Diagnosis not present

## 2014-12-28 DIAGNOSIS — I10 Essential (primary) hypertension: Secondary | ICD-10-CM | POA: Diagnosis present

## 2014-12-28 DIAGNOSIS — Z791 Long term (current) use of non-steroidal anti-inflammatories (NSAID): Secondary | ICD-10-CM | POA: Diagnosis not present

## 2014-12-28 DIAGNOSIS — F419 Anxiety disorder, unspecified: Secondary | ICD-10-CM | POA: Diagnosis present

## 2014-12-28 DIAGNOSIS — M25511 Pain in right shoulder: Secondary | ICD-10-CM | POA: Diagnosis present

## 2014-12-28 DIAGNOSIS — Z96652 Presence of left artificial knee joint: Secondary | ICD-10-CM | POA: Diagnosis present

## 2014-12-28 DIAGNOSIS — Z9842 Cataract extraction status, left eye: Secondary | ICD-10-CM

## 2014-12-28 DIAGNOSIS — Z96619 Presence of unspecified artificial shoulder joint: Secondary | ICD-10-CM

## 2014-12-28 DIAGNOSIS — Z96611 Presence of right artificial shoulder joint: Secondary | ICD-10-CM

## 2014-12-28 HISTORY — PX: REVERSE SHOULDER ARTHROPLASTY: SHX5054

## 2014-12-28 HISTORY — PX: REVERSE TOTAL SHOULDER ARTHROPLASTY: SHX2344

## 2014-12-28 SURGERY — ARTHROPLASTY, SHOULDER, TOTAL, REVERSE
Anesthesia: Regional | Laterality: Right

## 2014-12-28 MED ORDER — ONDANSETRON HCL 4 MG/2ML IJ SOLN: 4.0000 mg | Freq: Four times a day (QID) | INTRAMUSCULAR | Status: DC | PRN

## 2014-12-28 MED ORDER — ONDANSETRON HCL 4 MG PO TABS: 4.0000 mg | ORAL_TABLET | Freq: Four times a day (QID) | ORAL | Status: DC | PRN

## 2014-12-28 MED ORDER — LACTATED RINGERS IV SOLN
INTRAVENOUS | Status: DC | PRN
  Administered 2014-12-28 (×2): via INTRAVENOUS

## 2014-12-28 MED ORDER — CEFAZOLIN SODIUM 1-5 GM-% IV SOLN
1.0000 g | Freq: Four times a day (QID) | INTRAVENOUS | Status: AC
  Administered 2014-12-28 – 2014-12-29 (×3): 1 g via INTRAVENOUS
  Filled 2014-12-28 (×3): qty 50

## 2014-12-28 MED ORDER — DOCUSATE SODIUM 100 MG PO CAPS
100.0000 mg | ORAL_CAPSULE | Freq: Two times a day (BID) | ORAL | Status: DC
  Administered 2014-12-28 – 2014-12-30 (×5): 100 mg via ORAL
  Filled 2014-12-28 (×5): qty 1

## 2014-12-28 MED ORDER — GABAPENTIN 300 MG PO CAPS
900.0000 mg | ORAL_CAPSULE | Freq: Every day | ORAL | Status: DC
  Administered 2014-12-28 – 2014-12-29 (×2): 900 mg via ORAL
  Filled 2014-12-28 (×3): qty 3

## 2014-12-28 MED ORDER — NEOSTIGMINE METHYLSULFATE 10 MG/10ML IV SOLN
INTRAVENOUS | Status: AC
  Filled 2014-12-28: qty 3

## 2014-12-28 MED ORDER — GLYCOPYRROLATE 0.2 MG/ML IJ SOLN
INTRAMUSCULAR | Status: AC
  Filled 2014-12-28: qty 2

## 2014-12-28 MED ORDER — LIDOCAINE HCL 4 % MT SOLN
OROMUCOSAL | Status: DC | PRN
  Administered 2014-12-28: 4 mL via TOPICAL

## 2014-12-28 MED ORDER — LEVOTHYROXINE SODIUM 137 MCG PO TABS
137.0000 ug | ORAL_TABLET | Freq: Every day | ORAL | Status: DC
  Administered 2014-12-29 – 2014-12-30 (×2): 137 ug via ORAL
  Filled 2014-12-28 (×3): qty 1

## 2014-12-28 MED ORDER — PHENOL 1.4 % MT LIQD: 1.0000 | OROMUCOSAL | Status: DC | PRN

## 2014-12-28 MED ORDER — LIDOCAINE HCL (CARDIAC) 20 MG/ML IV SOLN
INTRAVENOUS | Status: DC | PRN
  Administered 2014-12-28: 60 mg via INTRAVENOUS

## 2014-12-28 MED ORDER — PHENYLEPHRINE 40 MCG/ML (10ML) SYRINGE FOR IV PUSH (FOR BLOOD PRESSURE SUPPORT)
PREFILLED_SYRINGE | INTRAVENOUS | Status: AC
  Filled 2014-12-28: qty 10

## 2014-12-28 MED ORDER — GABAPENTIN 300 MG PO CAPS: 300.0000 mg | ORAL_CAPSULE | Freq: Two times a day (BID) | ORAL | Status: DC

## 2014-12-28 MED ORDER — AMLODIPINE BESYLATE 5 MG PO TABS
5.0000 mg | ORAL_TABLET | Freq: Every day | ORAL | Status: DC
  Administered 2014-12-29 – 2014-12-30 (×2): 5 mg via ORAL
  Filled 2014-12-28 (×3): qty 1

## 2014-12-28 MED ORDER — BISOPROLOL FUMARATE 5 MG PO TABS
5.0000 mg | ORAL_TABLET | Freq: Every day | ORAL | Status: DC
  Administered 2014-12-28 – 2014-12-29 (×2): 5 mg via ORAL
  Filled 2014-12-28 (×4): qty 1

## 2014-12-28 MED ORDER — 0.9 % SODIUM CHLORIDE (POUR BTL) OPTIME
TOPICAL | Status: DC | PRN
  Administered 2014-12-28: 1000 mL

## 2014-12-28 MED ORDER — METOCLOPRAMIDE HCL 10 MG PO TABS: 5.0000 mg | ORAL_TABLET | Freq: Three times a day (TID) | ORAL | Status: DC | PRN

## 2014-12-28 MED ORDER — ACETAMINOPHEN 325 MG PO TABS
650.0000 mg | ORAL_TABLET | Freq: Four times a day (QID) | ORAL | Status: DC | PRN
  Administered 2014-12-28 – 2014-12-29 (×3): 650 mg via ORAL
  Filled 2014-12-28 (×3): qty 2

## 2014-12-28 MED ORDER — HYDROCODONE-ACETAMINOPHEN 5-325 MG PO TABS
1.0000 | ORAL_TABLET | ORAL | Status: DC | PRN
  Administered 2014-12-29 (×2): 2 via ORAL
  Filled 2014-12-28 (×3): qty 2

## 2014-12-28 MED ORDER — PHENYLEPHRINE HCL 10 MG/ML IJ SOLN
INTRAMUSCULAR | Status: DC | PRN
  Administered 2014-12-28: 100 ug via INTRAVENOUS

## 2014-12-28 MED ORDER — FENTANYL CITRATE 0.05 MG/ML IJ SOLN
INTRAMUSCULAR | Status: DC | PRN
  Administered 2014-12-28: 50 ug via INTRAVENOUS

## 2014-12-28 MED ORDER — HYDROMORPHONE HCL 1 MG/ML IJ SOLN: 0.2500 mg | INTRAMUSCULAR | Status: DC | PRN

## 2014-12-28 MED ORDER — SODIUM CHLORIDE 0.9 % IR SOLN
Status: DC | PRN
  Administered 2014-12-28: 3000 mL

## 2014-12-28 MED ORDER — MENTHOL 3 MG MT LOZG: 1.0000 | LOZENGE | OROMUCOSAL | Status: DC | PRN

## 2014-12-28 MED ORDER — SODIUM CHLORIDE 0.9 % IV SOLN
INTRAVENOUS | Status: DC
  Administered 2014-12-28: 21:00:00 via INTRAVENOUS

## 2014-12-28 MED ORDER — PRAVASTATIN SODIUM 10 MG PO TABS
10.0000 mg | ORAL_TABLET | Freq: Every day | ORAL | Status: DC
  Administered 2014-12-28 – 2014-12-29 (×2): 10 mg via ORAL
  Filled 2014-12-28 (×3): qty 1

## 2014-12-28 MED ORDER — FLUTICASONE PROPIONATE 50 MCG/ACT NA SUSP: 1.0000 | Freq: Every day | NASAL | Status: DC | PRN

## 2014-12-28 MED ORDER — ACETAMINOPHEN 650 MG RE SUPP: 650.0000 mg | Freq: Four times a day (QID) | RECTAL | Status: DC | PRN

## 2014-12-28 MED ORDER — FLEET ENEMA 7-19 GM/118ML RE ENEM: 1.0000 | ENEMA | Freq: Once | RECTAL | Status: AC | PRN

## 2014-12-28 MED ORDER — BUSPIRONE HCL 15 MG PO TABS
15.0000 mg | ORAL_TABLET | Freq: Two times a day (BID) | ORAL | Status: DC
  Administered 2014-12-28 – 2014-12-30 (×4): 15 mg via ORAL
  Filled 2014-12-28 (×6): qty 1

## 2014-12-28 MED ORDER — METHOCARBAMOL 750 MG PO TABS
750.0000 mg | ORAL_TABLET | Freq: Every day | ORAL | Status: DC | PRN
  Administered 2014-12-28 – 2014-12-29 (×2): 750 mg via ORAL
  Filled 2014-12-28 (×4): qty 1

## 2014-12-28 MED ORDER — EPHEDRINE SULFATE 50 MG/ML IJ SOLN
INTRAMUSCULAR | Status: AC
  Filled 2014-12-28: qty 1

## 2014-12-28 MED ORDER — FENTANYL CITRATE 0.05 MG/ML IJ SOLN
INTRAMUSCULAR | Status: AC
  Filled 2014-12-28: qty 5

## 2014-12-28 MED ORDER — ONDANSETRON HCL 4 MG/2ML IJ SOLN
INTRAMUSCULAR | Status: DC | PRN
  Administered 2014-12-28: 4 mg via INTRAVENOUS

## 2014-12-28 MED ORDER — PROPOFOL 10 MG/ML IV BOLUS
INTRAVENOUS | Status: AC
  Filled 2014-12-28: qty 20

## 2014-12-28 MED ORDER — MIDAZOLAM HCL 2 MG/2ML IJ SOLN
INTRAMUSCULAR | Status: AC
  Filled 2014-12-28: qty 2

## 2014-12-28 MED ORDER — LIDOCAINE HCL (CARDIAC) 20 MG/ML IV SOLN
INTRAVENOUS | Status: AC
  Filled 2014-12-28: qty 10

## 2014-12-28 MED ORDER — ROCURONIUM BROMIDE 100 MG/10ML IV SOLN
INTRAVENOUS | Status: DC | PRN
  Administered 2014-12-28: 25 mg via INTRAVENOUS

## 2014-12-28 MED ORDER — ONDANSETRON HCL 4 MG/2ML IJ SOLN
INTRAMUSCULAR | Status: AC
  Filled 2014-12-28: qty 2

## 2014-12-28 MED ORDER — BISACODYL 10 MG RE SUPP
10.0000 mg | Freq: Every day | RECTAL | Status: DC | PRN
  Administered 2014-12-30: 10 mg via RECTAL
  Filled 2014-12-28: qty 1

## 2014-12-28 MED ORDER — ROCURONIUM BROMIDE 50 MG/5ML IV SOLN
INTRAVENOUS | Status: AC
  Filled 2014-12-28: qty 1

## 2014-12-28 MED ORDER — MORPHINE SULFATE 2 MG/ML IJ SOLN
1.0000 mg | INTRAMUSCULAR | Status: DC | PRN
  Administered 2014-12-28 – 2014-12-29 (×4): 1 mg via INTRAVENOUS
  Filled 2014-12-28 (×5): qty 1

## 2014-12-28 MED ORDER — POVIDONE-IODINE 7.5 % EX SOLN
Freq: Once | CUTANEOUS | Status: DC
  Filled 2014-12-28: qty 118

## 2014-12-28 MED ORDER — POLYETHYLENE GLYCOL 3350 17 G PO PACK
17.0000 g | PACK | Freq: Every day | ORAL | Status: DC | PRN
  Administered 2014-12-29 – 2014-12-30 (×2): 17 g via ORAL
  Filled 2014-12-28: qty 1

## 2014-12-28 MED ORDER — PROPOFOL 10 MG/ML IV BOLUS
INTRAVENOUS | Status: DC | PRN
  Administered 2014-12-28: 140 mg via INTRAVENOUS

## 2014-12-28 MED ORDER — METOCLOPRAMIDE HCL 5 MG/ML IJ SOLN
5.0000 mg | Freq: Three times a day (TID) | INTRAMUSCULAR | Status: DC | PRN
  Filled 2014-12-28: qty 2

## 2014-12-28 MED ORDER — GLYCOPYRROLATE 0.2 MG/ML IJ SOLN
INTRAMUSCULAR | Status: DC | PRN
  Administered 2014-12-28: 0.4 mg via INTRAVENOUS

## 2014-12-28 MED ORDER — DIPHENHYDRAMINE HCL 12.5 MG/5ML PO ELIX: 12.5000 mg | ORAL_SOLUTION | ORAL | Status: DC | PRN

## 2014-12-28 MED ORDER — STERILE WATER FOR INJECTION IJ SOLN
INTRAMUSCULAR | Status: AC
  Filled 2014-12-28: qty 10

## 2014-12-28 MED ORDER — MORPHINE SULFATE 15 MG PO TABS
15.0000 mg | ORAL_TABLET | Freq: Four times a day (QID) | ORAL | Status: DC | PRN
Start: 2014-12-28 — End: 2014-12-29
  Administered 2014-12-28 – 2014-12-29 (×3): 15 mg via ORAL
  Filled 2014-12-28 (×3): qty 1

## 2014-12-28 MED ORDER — NEOSTIGMINE METHYLSULFATE 10 MG/10ML IV SOLN
INTRAVENOUS | Status: DC | PRN
  Administered 2014-12-28: 3 mg via INTRAVENOUS

## 2014-12-28 MED ORDER — ALUMINUM HYDROXIDE GEL 320 MG/5ML PO SUSP
15.0000 mL | ORAL | Status: DC | PRN
  Filled 2014-12-28: qty 30

## 2014-12-28 MED ORDER — SUCCINYLCHOLINE CHLORIDE 20 MG/ML IJ SOLN
INTRAMUSCULAR | Status: AC
  Filled 2014-12-28: qty 1

## 2014-12-28 MED ORDER — GABAPENTIN 300 MG PO CAPS
300.0000 mg | ORAL_CAPSULE | Freq: Every day | ORAL | Status: DC
  Administered 2014-12-29 – 2014-12-30 (×2): 300 mg via ORAL
  Filled 2014-12-28 (×3): qty 1

## 2014-12-28 MED ORDER — ONDANSETRON HCL 4 MG/2ML IJ SOLN: 4.0000 mg | Freq: Once | INTRAMUSCULAR | Status: DC | PRN

## 2014-12-28 MED ORDER — SUCCINYLCHOLINE CHLORIDE 20 MG/ML IJ SOLN
INTRAMUSCULAR | Status: DC | PRN
  Administered 2014-12-28: 40 mg via INTRAVENOUS

## 2014-12-28 MED ORDER — HYDROXYCHLOROQUINE SULFATE 200 MG PO TABS
200.0000 mg | ORAL_TABLET | Freq: Two times a day (BID) | ORAL | Status: DC
  Administered 2014-12-28 – 2014-12-30 (×4): 200 mg via ORAL
  Filled 2014-12-28 (×7): qty 1

## 2014-12-28 MED ORDER — PHENYLEPHRINE HCL 10 MG/ML IJ SOLN
10.0000 mg | INTRAMUSCULAR | Status: DC | PRN
  Administered 2014-12-28: 20 ug/min via INTRAVENOUS

## 2014-12-28 MED ORDER — DIAZEPAM 5 MG PO TABS
5.0000 mg | ORAL_TABLET | Freq: Every evening | ORAL | Status: DC | PRN
  Administered 2014-12-29: 5 mg via ORAL
  Filled 2014-12-28: qty 1

## 2014-12-28 MED ORDER — ZOLPIDEM TARTRATE 5 MG PO TABS
5.0000 mg | ORAL_TABLET | Freq: Every day | ORAL | Status: DC
  Administered 2014-12-28 – 2014-12-29 (×2): 5 mg via ORAL
  Filled 2014-12-28 (×2): qty 1

## 2014-12-28 SURGICAL SUPPLY — 76 items
ALLEN UNIVERSAL HEAD RESTRAINT ×2 IMPLANT
BIT DRILL 3.0 (BIT) ×1 IMPLANT
BIT DRILL HEX (BIT) ×1 IMPLANT
BLADE SAW SAG 73X25 THK (BLADE) ×1
BLADE SAW SGTL 73X25 THK (BLADE) ×1 IMPLANT
BLADE SURG 15 STRL LF DISP TIS (BLADE) ×1 IMPLANT
BLADE SURG 15 STRL SS (BLADE) ×2
BOWL SMART MIX CTS (DISPOSABLE) IMPLANT
CAP SHOULDER REVTOTAL 2 ×2 IMPLANT
CEMENT BONE DEPUY (Cement) ×2 IMPLANT
CEMENT RESTRICTOR 8-15 (Cement) ×2 IMPLANT
CHLORAPREP W/TINT 26ML (MISCELLANEOUS) ×4 IMPLANT
COVER MAYO STAND STRL (DRAPES) ×2 IMPLANT
COVER SURGICAL LIGHT HANDLE (MISCELLANEOUS) ×2 IMPLANT
DRAPE INCISE IOBAN 66X45 STRL (DRAPES) ×4 IMPLANT
DRAPE ORTHO SPLIT 77X108 STRL (DRAPES) ×4
DRAPE SURG 17X23 STRL (DRAPES) ×2 IMPLANT
DRAPE SURG ORHT 6 SPLT 77X108 (DRAPES) ×2 IMPLANT
DRAPE U-SHAPE 47X51 STRL (DRAPES) ×2 IMPLANT
DRILL BIT 3.0 (BIT) ×1
DRILL BIT 5/64 (BIT) ×2 IMPLANT
DRILL BIT HEX (BIT) ×2
DRSG AQUACEL AG ADV 3.5X10 (GAUZE/BANDAGES/DRESSINGS) ×2 IMPLANT
ELECT BLADE 4.0 EZ CLEAN MEGAD (MISCELLANEOUS) ×2
ELECT REM PT RETURN 9FT ADLT (ELECTROSURGICAL) ×2
ELECTRODE BLDE 4.0 EZ CLN MEGD (MISCELLANEOUS) ×1 IMPLANT
ELECTRODE REM PT RTRN 9FT ADLT (ELECTROSURGICAL) ×1 IMPLANT
EVACUATOR 1/8 PVC DRAIN (DRAIN) IMPLANT
GLOVE BIO SURGEON STRL SZ7 (GLOVE) ×2 IMPLANT
GLOVE BIO SURGEON STRL SZ7.5 (GLOVE) ×2 IMPLANT
GLOVE BIOGEL PI IND STRL 7.0 (GLOVE) ×1 IMPLANT
GLOVE BIOGEL PI IND STRL 8 (GLOVE) ×1 IMPLANT
GLOVE BIOGEL PI INDICATOR 7.0 (GLOVE) ×1
GLOVE BIOGEL PI INDICATOR 8 (GLOVE) ×1
GOWN STRL REUS W/ TWL LRG LVL3 (GOWN DISPOSABLE) ×3 IMPLANT
GOWN STRL REUS W/ TWL XL LVL3 (GOWN DISPOSABLE) ×1 IMPLANT
GOWN STRL REUS W/TWL LRG LVL3 (GOWN DISPOSABLE) ×6
GOWN STRL REUS W/TWL XL LVL3 (GOWN DISPOSABLE) ×2
HANDPIECE INTERPULSE COAX TIP (DISPOSABLE) ×2
HOOD PEEL AWAY FACE SHEILD DIS (HOOD) ×6 IMPLANT
KIT BASIN OR (CUSTOM PROCEDURE TRAY) ×2 IMPLANT
KIT ROOM TURNOVER OR (KITS) ×2 IMPLANT
MANIFOLD NEPTUNE II (INSTRUMENTS) ×2 IMPLANT
NEEDLE HYPO 25GX1X1/2 BEV (NEEDLE) ×2 IMPLANT
NEEDLE MAYO TROCAR (NEEDLE) ×2 IMPLANT
NOZZLE PRISM 8.5MM (MISCELLANEOUS) IMPLANT
NS IRRIG 1000ML POUR BTL (IV SOLUTION) ×2 IMPLANT
PACK SHOULDER (CUSTOM PROCEDURE TRAY) ×2 IMPLANT
PAD ARMBOARD 7.5X6 YLW CONV (MISCELLANEOUS) ×4 IMPLANT
PIN GUIDE 2.5X200 (PIN) ×2 IMPLANT
RETRIEVER SUT HEWSON (MISCELLANEOUS) ×2 IMPLANT
SET HNDPC FAN SPRY TIP SCT (DISPOSABLE) ×1 IMPLANT
SLING ARM IMMOBILIZER LRG (SOFTGOODS) ×2 IMPLANT
SLING ARM IMMOBILIZER MED (SOFTGOODS) IMPLANT
SPONGE LAP 18X18 X RAY DECT (DISPOSABLE) ×2 IMPLANT
SPONGE LAP 4X18 X RAY DECT (DISPOSABLE) IMPLANT
STRIP CLOSURE SKIN 1/2X4 (GAUZE/BANDAGES/DRESSINGS) ×2 IMPLANT
SUCTION FRAZIER TIP 10 FR DISP (SUCTIONS) ×2 IMPLANT
SUPPORT WRAP ARM LG (MISCELLANEOUS) ×2 IMPLANT
SUT ETHIBOND 2 OS 4 DA (SUTURE) ×4 IMPLANT
SUT ETHIBOND NAB CT1 #1 30IN (SUTURE) IMPLANT
SUT FIBERWIRE #2 38 T-5 BLUE (SUTURE)
SUT MNCRL AB 4-0 PS2 18 (SUTURE) ×2 IMPLANT
SUT SILK 2 0 TIES 17X18 (SUTURE)
SUT SILK 2-0 18XBRD TIE BLK (SUTURE) IMPLANT
SUT VIC AB 0 CTB1 27 (SUTURE) ×2 IMPLANT
SUT VIC AB 2-0 CT1 27 (SUTURE) ×2
SUT VIC AB 2-0 CT1 TAPERPNT 27 (SUTURE) ×1 IMPLANT
SUTURE FIBERWR #2 38 T-5 BLUE (SUTURE) IMPLANT
SYR CONTROL 10ML LL (SYRINGE) IMPLANT
SYRINGE TOOMEY DISP (SYRINGE) IMPLANT
TAPE FIBER 2MM 7IN #2 BLUE (SUTURE) IMPLANT
TOWEL OR 17X24 6PK STRL BLUE (TOWEL DISPOSABLE) ×2 IMPLANT
TOWEL OR 17X26 10 PK STRL BLUE (TOWEL DISPOSABLE) ×2 IMPLANT
WATER STERILE IRR 1000ML POUR (IV SOLUTION) ×2 IMPLANT
YANKAUER SUCT BULB TIP NO VENT (SUCTIONS) ×2 IMPLANT

## 2014-12-28 NOTE — Transfer of Care (Signed)
Immediate Anesthesia Transfer of Care Note  Patient: Destiny [name]  Procedure(s) Performed: Procedure(s) with comments: REVERSE SHOULDER ARTHROPLASTY (Right) - Right reverse total shoulder arthroplasty  Patient Location: PACU  Anesthesia Type:General and Regional  Level of Consciousness: awake, alert  and oriented  Airway & Oxygen Therapy: Patient Spontanous Breathing and Patient connected to nasal cannula oxygen  Post-op Assessment: Report given to RN, Post -op Vital signs reviewed and stable and Patient moving all extremities X 4  Post vital signs: Reviewed and stable  Last Vitals:  Filed Vitals:   12/28/14 0557  BP: 144/51  Pulse: 71  Temp: 36.4 C  Resp: 20    Complications: No apparent anesthesia complications

## 2014-12-28 NOTE — H&P (Signed)
Destiny [name] is an 74 y.o. female.   Chief Complaint: R shoulder pain and dysfunction HPI: R shoulder rotator cuff tear arthropathy, failed extensive conservative management with injections, medications, activity modification.  Past Medical History  Diagnosis Date  . Colon polyps   . Complication of anesthesia     BP drops with fentyl and versed  . Hypertension   . Hypothyroidism   . Anxiety   . Arthritis   . Hepatitis     hep A  1976    Past Surgical History  Procedure Laterality Date  . Thyroidectomy, partial      one lobe removed  . Abdominal hysterectomy    . Rotator cufff    . Appendectomy    . Cesarean section      times 2  . Foot surgery Bilateral   . Fracture surgery      broken ankle  . Medial partial knee replacement Left     done at [facility] [place]  . Varicose vein surgery Right   . Back surgery       fusion  . Eye surgery Left     cataract removed    History reviewed. No pertinent family history. Social History:  reports that she has never smoked. She has never used smokeless tobacco. She reports that she drinks alcohol. She reports that she does not use illicit drugs.  Allergies:  Allergies  Allergen Reactions  . Nsaids Other (See Comments)     BRUISES EASILY  . Aspirin Other (See Comments)    Bruising   . Fentanyl Other (See Comments)    Does not work  . Lyrica [Pregabalin] Other (See Comments)    dysphoria  . Oxycodone Other (See Comments)    Does not work  . Versed [Midazolam] Other (See Comments)    Does not work  . Hydromorphone Hcl Itching     Itching with large doses - able to tolerate lower doses or shorter duration  . Voltaren Gel [Diclofenac Sodium] Rash and Other (See Comments)    Skin peeling.    Medications Prior to Admission  Medication Sig Dispense Refill  . acetaminophen (TYLENOL) 325 MG tablet Take 325 mg by mouth daily as needed (pain).    Marland Kitchen amLODipine (NORVASC) 10 MG tablet Take 5 mg by mouth daily.     Marland Kitchen amoxicillin  (AMOXIL) 500 MG capsule Take 2,000 mg by mouth See admin instructions. Take 4 tablets (2000 mg) 1 hour prior to dental procedure  0  . bisoprolol (ZEBETA) 5 MG tablet Take 5 mg by mouth at bedtime.     . busPIRone (BUSPAR) 15 MG tablet Take 15 mg by mouth 2 (two) times daily after a meal.     . Calcium Carb-Cholecalciferol 600-200 MG-UNIT TABS Take 1 tablet by mouth 2 (two) times daily.    . cholecalciferol (VITAMIN D) 1000 UNITS tablet Take 2,000 Units by mouth daily.    . diazepam (VALIUM) 5 MG tablet Take 5 mg by mouth at bedtime as needed (sleep).     . diphenhydrAMINE (BENADRYL) 25 MG tablet Take 25 mg by mouth 3 times/day as needed-between meals & bedtime for sleep.    . fluticasone (FLONASE) 50 MCG/ACT nasal spray Place 1 spray into both nostrils daily as needed (congestion).     . gabapentin (NEURONTIN) 300 MG capsule Take 300-900 mg by mouth 2 (two) times daily. Take 1 capsule (300 mg) every morning and 3 capsules (900 mg) at bedtime    . HYDROcodone-acetaminophen (NORCO/VICODIN)  5-325 MG per tablet Take 1 tablet by mouth daily as needed (pain).   0  . hydroxychloroquine (PLAQUENIL) 200 MG tablet Take 200 mg by mouth 2 (two) times daily.      Marland Kitchen ibuprofen (ADVIL,MOTRIN) 800 MG tablet Take 400 mg by mouth daily as needed (pain).    Marland Kitchen levothyroxine (SYNTHROID, LEVOTHROID) 137 MCG tablet Take 137 mcg by mouth daily before breakfast.    . loratadine-pseudoephedrine (CLARITIN-D 12-HOUR) 5-120 MG per tablet Take 1 tablet by mouth daily.    . methocarbamol (ROBAXIN) 750 MG tablet Take 750 mg by mouth daily as needed for muscle spasms.     Marland Kitchen morphine (MSIR) 15 MG tablet Take 15 mg by mouth every 6 (six) hours as needed (pain).     . pravastatin (PRAVACHOL) 10 MG tablet Take 10 mg by mouth at bedtime.     Marland Kitchen zolpidem (AMBIEN) 10 MG tablet Take 5 mg by mouth at bedtime.   1  . oxazepam (SERAX) 15 MG capsule 1-2 by mouth at bedtime as needed for insomnia (Patient not taking: Reported on 12/18/2014) 30  capsule 5    No results found for this or any previous visit (from the past 48 hour(s)). No results found.  Review of Systems  All other systems reviewed and are negative.   Blood pressure 144/51, pulse 71, temperature 97.5 F (36.4 C), temperature source Oral, resp. rate 20, height 5' 1.75" (1.568 m), weight 70.308 kg (155 lb), SpO2 100 %. Physical Exam  Constitutional: She is oriented to person, place, and time. She appears well-developed and well-nourished.  HENT:  Head: Atraumatic.  Eyes: EOM are normal.  Cardiovascular: Intact distal pulses.   Respiratory: Effort normal.  Musculoskeletal:  R shoulder pain with limited motion  Neurological: She is alert and oriented to person, place, and time.  Skin: Skin is warm and dry.  Psychiatric: She has a normal mood and affect.     Assessment/Plan R shoulder rotator cuff tear arthropathy Plan R reverse TSA Risks / benefits of surgery discussed Consent on chart  NPO for OR Preop antibiotics   Destiny [name] WILLIAM 12/28/2014, 7:13 AM   2

## 2014-12-28 NOTE — Anesthesia Preprocedure Evaluation (Addendum)
Anesthesia Evaluation  Patient identified by MRN, date of birth, ID band Patient awake    Reviewed: Allergy & Precautions, NPO status , Patient's Chart, lab work & pertinent test results  Airway Mallampati: II  TM Distance: >3 FB Neck ROM: Full    Dental  (+) Dental Advisory Given, Teeth Intact   Pulmonary          Cardiovascular hypertension, Pt. on medications     Neuro/Psych PSYCHIATRIC DISORDERS Anxiety  Neuromuscular disease    GI/Hepatic (+) Hepatitis -, A  Endo/Other  Hypothyroidism   Renal/GU      Musculoskeletal  (+) Arthritis -,   Abdominal   Peds  Hematology  (+) anemia ,   Anesthesia Other Findings   Reproductive/Obstetrics                           Anesthesia Physical Anesthesia Plan  ASA: III  Anesthesia Plan: General and Regional   Post-op Pain Management:    Induction: Intravenous  Airway Management Planned: Oral ETT  Additional Equipment:   Intra-op Plan:   Post-operative Plan: Extubation in OR  Informed Consent: I have reviewed the patients History and Physical, chart, labs and discussed the procedure including the risks, benefits and alternatives for the proposed anesthesia with the patient or authorized representative who has indicated his/her understanding and acceptance.   Dental advisory given  Plan Discussed with: CRNA, Anesthesiologist and Surgeon  Anesthesia Plan Comments:       Anesthesia Quick Evaluation

## 2014-12-28 NOTE — Anesthesia Procedure Notes (Addendum)
Procedure Name: Intubation Date/Time: 12/28/2014 7:38 AM Performed by: Garrison Columbus T Pre-anesthesia Checklist: Patient identified, Emergency Drugs available, Suction available and Patient being monitored Patient Re-evaluated:Patient Re-evaluated prior to inductionOxygen Delivery Method: Circle system utilized Preoxygenation: Pre-oxygenation with 100% oxygen Intubation Type: IV induction Ventilation: Mask ventilation without difficulty Laryngoscope Size: Miller and 2 Grade View: Grade I Tube type: Oral Tube size: 7.5 mm Number of attempts: 1 Airway Equipment and Method: Stylet and LTA kit utilized Placement Confirmation: ETT inserted through vocal cords under direct vision,  positive ETCO2 and breath sounds checked- equal and bilateral Secured at: 21 cm Tube secured with: Tape Dental Injury: Teeth and Oropharynx as per pre-operative assessment    Anesthesia Regional Block:  Interscalene brachial plexus block  Pre-Anesthetic Checklist: ,, timeout performed, Correct Patient, Correct Site, Correct Laterality, Correct Procedure, Correct Position, site marked, Risks and benefits discussed,  Surgical consent,  Pre-op evaluation,  At surgeon's request and post-op pain management  Laterality: Right  Prep: Maximum Sterile Barrier Precautions used, chloraprep and alcohol swabs       Needles:  Injection technique: Single-shot  Needle Type: Stimulator Needle - 40        Needle insertion depth: 4 cm   Additional Needles:  Procedures: nerve stimulator Interscalene brachial plexus block  Nerve Stimulator or Paresthesia:  Response: 0.5 mA, 0.1 ms, 4 cm  Additional Responses:   Narrative:  Start time: 12/28/2014 7:05 AM End time: 12/28/2014 7:10 AM Injection made incrementally with aspirations every 5 mL.  Performed by: Personally  Anesthesiologist: Kate Sable  Additional Notes: Pt accepts procedure w/ risks. 20cc 0.5% Marcaine w/ epi w/o discomfort or difficulty. GES

## 2014-12-28 NOTE — Op Note (Signed)
Procedure(s): REVERSE SHOULDER ARTHROPLASTY Procedure Note  KEIARAH ORLOWSKI female 74 y.o. 12/28/2014  Procedure(s) and Anesthesia Type:      Right reverse total shoulder arthroplasty  Indications:  74 y.o. female  With right shoulder arthritis with irrepairable rotator cuff tear. Pain and dysfunction interfered with quality of life and nonoperative treatment with activity modification, NSAIDS and injections failed.     Surgeon: Nita Sells   Assistants: Jeanmarie Hubert PA-C [facility] was present and scrubbed throughout the procedure and was essential in positioning, retraction, exposure, and closure)  Anesthesia: General endotracheal anesthesia with preoperative interscalene block given by the attending anesthesiologist    Procedure Detail  REVERSE SHOULDER ARTHROPLASTY   Estimated Blood Loss:  200 mL         Drains: none  Blood Given: none          Specimens: none        Complications:  Fracture requiring cerclage wiring         Disposition: PACU - hemodynamically stable.         Condition: stable      OPERATIVE FINDINGS:  A Tornier cemented Aqualis reverse total shoulder arthroplasty was placed with a  size12 stem, a 36 eccentric glenosphere,68mm poly insert. The base plate  fixation was excellent with a central threaded peg.  PROCEDURE: The patient was identified in the preoperative holding area  where I personally marked the operative site after verifying site, side,  and procedure with the patient. An interscalene block given by  the attending anesthesiologist in the holding area and the patient was taken back to the operating room where all extremities were  carefully padded in position after general anesthesia was induced. She  was placed in a beach-chair position and the operative upper extremity was  prepped and draped in a standard sterile fashion. An approximately 10-  cm incision was made from the tip of the coracoid process to the center   point of the humerus at the level of the axilla. Dissection was carried  down through subcutaneous tissues to the level of the cephalic vein  which was taken laterally with the deltoid. The pectoralis major was  retracted medially. The subdeltoid space was developed and the lateral  edge of the conjoined tendon was identified. The undersurface of  conjoined tendon was palpated and the musculocutaneous nerve was not in  the field. Retractor was placed underneath the conjoined and second  retractor was placed lateral into the deltoid. The circumflex humeral  artery and vessels were identified and clamped and coagulated. The  biceps tendon was  Absent, the subscapularis was torn. The  joint was then gently externally rotated while the capsule was released  from the humeral neck around to just beyond the 6 o'clock position. At  this point, the joint was dislocated and the humeral head was presented  into the wound. The excessive osteophyte formation was removed with a  large rongeur.  The cutting guide was used to make the appropriate  head cut and the head was saved for potentially bone grafting.  The glenoid was exposed with the arm in an  abducted extended position. The anterior and posterior labrum were  completely excised and the capsule was released circumferentially to  allow for exposure of the glenoid for preparation. The guidepin was  placed using the guide in10 inferior angulation and the reamer was  placed over the guidepin to ream down to concentric surface. Superior  hand reamer was used as well. The  central drill hole was then made after measuring for the central threaded peg. The Metaglene was then screwed in wexcellent purchase. The superior and inferior screws were then drilled, measured, and filled with appropriate-sized locking screws. Fixation was very good. The Glenosphere was then impacted and screwed in place with the eccentricity inferiorly.  The humerus was then again  exposed and the entry all was used followed by sequential sounding reamers. The broaches were then used to broach up to size  5, which was left in place as a trial. Calcar planer was used.  bone quality was noted to be very poor.  The trial tray was then placed the joint was attempted to be reduced, however it was very tight and with attempted reduction a fracture in the proximal humerus was noted extending into the calcar and greater tuberosity. Therefore the implant was removed and cerclage wires were placed with 18-gauge wire twisting down to appropriate tightness and cut and bent. This provided anatomic reduction of the fracture.   At this point the decision was made to switch to the standard long stem implant with cementation. Therefore the metaphysis was reamed and the trial stem was placed with a size 12 implant. I could then reduce it without difficulty and appropriate soft tissue tension. Therefore the trial implant was removed and the intramedullary canal was prepared and subsequently the implant was cemented into place with a distal cement restrictor. The joint was then reduced with excellent soft tissue tension and range of motion with no instability.  The joint was then copiously irrigated with pulse  lavage and the wound was then closed. The subscapularis was not repaired.  Skin was closed with 2-0 Vicryl in a deep dermal layer and 4-0  Monocryl for skin closure. Steri-Strips were applied. Sterile  dressings were then applied as well as a sling. The patient was allowed  to awaken from general anesthesia, transferred to stretcher, and taken  to recovery room in stable condition.   POSTOPERATIVE PLAN: The patient will be kept in the hospital postoperatively  for pain control and therapy.  We will keep her in a sling postoperatively for 6 weeks given the fracture.

## 2014-12-29 ENCOUNTER — Encounter (HOSPITAL_COMMUNITY): Payer: Self-pay | Admitting: General Practice

## 2014-12-29 LAB — BASIC METABOLIC PANEL
Anion gap: 8 (ref 5–15)
BUN: 9 mg/dL (ref 6–23)
CO2: 25 mmol/L (ref 19–32)
Calcium: 8 mg/dL — ABNORMAL LOW (ref 8.4–10.5)
Chloride: 100 mmol/L (ref 96–112)
Creatinine, Ser: 0.88 mg/dL (ref 0.50–1.10)
GFR calc Af Amer: 73 mL/min — ABNORMAL LOW (ref >90)
GFR calc non Af Amer: 63 mL/min — ABNORMAL LOW (ref >90)
Glucose, Bld: 125 mg/dL — ABNORMAL HIGH (ref 70–99)
Potassium: 4 mmol/L (ref 3.5–5.1)
Sodium: 133 mmol/L — ABNORMAL LOW (ref 135–145)

## 2014-12-29 LAB — CBC
HCT: 29.6 % — ABNORMAL LOW (ref 36.0–46.0)
Hemoglobin: 9.8 g/dL — ABNORMAL LOW (ref 12.0–15.0)
MCH: 33 pg (ref 26.0–34.0)
MCHC: 33.1 g/dL (ref 30.0–36.0)
MCV: 99.7 fL (ref 78.0–100.0)
Platelets: 218 10*3/uL (ref 150–400)
RBC: 2.97 MIL/uL — ABNORMAL LOW (ref 3.87–5.11)
RDW: 13.1 % (ref 11.5–15.5)
WBC: 8.1 10*3/uL (ref 4.0–10.5)

## 2014-12-29 MED ORDER — MORPHINE SULFATE 15 MG PO TABS
15.0000 mg | ORAL_TABLET | ORAL | Status: DC | PRN
  Administered 2014-12-29 – 2014-12-30 (×4): 15 mg via ORAL
  Filled 2014-12-29 (×5): qty 1

## 2014-12-29 MED ORDER — ACETAMINOPHEN 325 MG PO TABS
325.0000 mg | ORAL_TABLET | ORAL | Status: DC | PRN
  Administered 2014-12-29: 325 mg via ORAL
  Filled 2014-12-29: qty 1

## 2014-12-29 NOTE — Progress Notes (Signed)
Utilization review completed. Lige Lakeman, RN, BSN. 

## 2014-12-29 NOTE — Progress Notes (Signed)
Occupational Therapy Evaluation Patient Details Name: Destiny [name] MRN: [id number] DOB: 1941-03-23 Today's Date: 12/29/2014    History of Present Illness s/p R reverse TSA   Clinical Impression   Attempted to see pt 2x prior to session, however pt declining due to pain. Pain meds given prior to session. Pt only tolerating @ 30 degrees total elbow ROM. Pt stating that she did not receive instructions from Dr. Tamera Punt to move her elbow, therefore she "was not doing it". Increased time with educating pt/husband on management of RUE for ADL, positioning and sling management and reviewing written information "line by line" with husband. Will see again in am prior to D/C to review exercise and management of RUE and answer any questions to facilitate safe D/C home with 24/7 S.     Follow Up Recommendations  Other (comment) (follow up with Dr. Tamera Punt) who will advance activity when appropriate   Equipment Recommendations  Tub/shower seat (husband to get)    Recommendations for Other Services       Precautions / Restrictions Precautions Precautions: Fall;Shoulder Type of Shoulder Precautions: no shuolder ROM. elbow/wrist/hand only Shoulder Interventions: Shoulder sling/immobilizer;Off for dressing/bathing/exercises;At all times Precaution Booklet Issued: Yes (comment) Restrictions Weight Bearing Restrictions: Yes RUE Weight Bearing: Non weight bearing      Mobility Bed Mobility Overal bed mobility: Needs Assistance Bed Mobility: Supine to Sit     Supine to sit: Min assist     General bed mobility comments: pt stating "i cant move"  Transfers Overall transfer level: Needs assistance Equipment used: 1 person hand held assist Transfers: Sit to/from Stand Sit to Stand: Min guard (steady assist)         General transfer comment: Educated  husband safe tranfer technique by holding pt on her non operated side    Balance Overall balance assessment: Needs  assistance Sitting-balance support: Feet supported Sitting balance-Leahy Scale: Normal     Standing balance support: During functional activity Standing balance-Leahy Scale: Fair                              ADL Overall ADL's : Needs assistance/impaired Eating/Feeding: Set up   Grooming: Moderate assistance   Upper Body Bathing: Maximal assistance Upper Body Bathing Details (indicate cue type and reason): educated husband on compensatory Lower Body Bathing: Moderate assistance;Sit to/from stand   Upper Body Dressing : Maximal assistance Upper Body Dressing Details (indicate cue type and reason): educated husband on compensatory technique Lower Body Dressing: Moderate assistance   Toilet Transfer: Min guard   Toileting- Clothing Manipulation and Hygiene: Moderate assistance;Sit to/from stand       Functional mobility during ADLs: Minimal assistance (to steady pt) General ADL Comments: extended length of time spent educating pt/husband on compensatory techniques. Husband able to donn sling on pt with vc.  Used deep breathing relaxation techniques prior to attempting any AA/self ROM of R elbow.                                     Pertinent Vitals/Pain Pain Assessment: 0-10 Pain Score: 8  Pain Location: R shoulder Pain Descriptors / Indicators: Aching;Constant Pain Intervention(s): Limited activity within patient's tolerance;Monitored during session;Premedicated before session;Relaxation;Ice applied     Hand Dominance Right   Extremity/Trunk Assessment Upper Extremity Assessment Upper Extremity Assessment: RUE deficits/detail RUE Deficits / Details: s/p reverse TSA . very painful  and anxious of any movement or touching of RUE. encouraged self ROM RUE Coordination: decreased gross motor;decreased fine motor (hx of OA)   Lower Extremity Assessment Lower Extremity Assessment: Overall WFL for tasks assessed   Cervical / Trunk  Assessment Cervical / Trunk Assessment: Kyphotic   Communication Communication Communication: No difficulties   Cognition Arousal/Alertness: Awake/alert Behavior During Therapy: Anxious Overall Cognitive Status: Within Functional Limits for tasks assessed                     General Comments   Pt appears very anxious regarding anything having to do with her RUE. She states she was not able to move it before surgery and knew she" couldn't move it now". Explained process of reverse TSA and that it was normal to have pain after extensive surgery.     Exercises Exercises: Shoulder     Shoulder Instructions Shoulder Instructions Donning/doffing shirt without moving shoulder: Maximal assistance Method for sponge bathing under operated UE: Maximal assistance Donning/doffing sling/immobilizer: Caregiver independent with task Correct positioning of sling/immobilizer: Caregiver independent with task ROM for elbow, wrist and digits of operated UE: Moderate assistance (Pt only allowing @ 30 degrees total movement) Sling wearing schedule (on at all times/off for ADL's): Caregiver independent with task Proper positioning of operated UE when showering: Caregiver independent with task Positioning of UE while sleeping: Caregiver independent with task    Home Living Family/patient expects to be discharged to:: Private residence Living Arrangements: Spouse/significant other;Children Available Help at Discharge: Available 24 hours/day;Family Type of Home: House Home Access: Stairs to enter Technical brewer of Steps: 1   Home Layout: One level     Bathroom Shower/Tub: Occupational psychologist: Handicapped height Bathroom Accessibility: Yes How Accessible: Accessible via walker Home Equipment: None          Prior Functioning/Environment Level of Independence: [employer], OTR/L  [phone] Feb 06, 2016OT Diagnosis: Generalized weakness;Acute  pain   OT Problem List: Decreased strength;Decreased range of motion;Decreased activity tolerance;Decreased safety awareness;Decreased knowledge of precautions;Impaired UE functional use;Pain   OT Treatment/Interventions: Self-care/ADL training;Therapeutic exercise;DME and/or AE instruction;Therapeutic activities;Patient/family education    OT Goals(Current goals can be found in the care plan section) Acute Rehab OT Goals Patient Stated Goal: to go home OT Goal Formulation: Patient unable to participate in goal setting Time For Goal Achievement: 01/12/15 Potential to Achieve Goals: Good  OT Frequency: Min 2X/week   Barriers to D/C:            Co-evaluation              End of Session Nurse Communication: Mobility status;Precautions;Weight bearing status  Activity Tolerance: Patient limited by pain;Other (comment) (limited by anxiety) Patient left: in chair;with call bell/phone within reach;with family/visitor present   Time: 1205-1251 OT Time Calculation (min): 46 min Charges:  OT General Charges $OT Visit: 1 Procedure OT Evaluation $Initial OT Evaluation Tier I: 1 Procedure OT Treatments $Self Care/Home Management : 23-37 mins G-Codes:    Analise Glotfelty,HILLARY 30-Dec-2014, 1:56 PM

## 2014-12-29 NOTE — Progress Notes (Signed)
   PATIENT ID: Destiny [name]   1 Day Post-Op Procedure(s) (LRB): REVERSE SHOULDER ARTHROPLASTY (Right)  Subjective: Patient reports pain overnight once block wore off. Difficulty using left hand for ADLs. Wants to minimize narcotics. Desires to stay another night. No other complaints or concerns.   Objective:  Filed Vitals:   12/29/14 0502  BP: 120/42  Pulse: 72  Temp: 98.1 F (36.7 C)  Resp: 17     R shoulder dressing c/d/i Wiggles fingers, distally NVI Mod swelling distally in hand  Labs:   Recent Labs  12/29/14 0535  HGB 9.8*   Recent Labs  12/29/14 0535  WBC 8.1  RBC 2.97*  HCT 29.6*  PLT 218   Recent Labs  12/29/14 0535  NA 133*  K 4.0  CL 100  CO2 25  BUN 9  CREATININE 0.88  GLUCOSE 125*  CALCIUM 8.0*    Assessment and Plan: 1 day s/p right reserve TSA On chronic pain medication, may have some difficulty with pain control, d/c IV morphine as soon as possible.  OT today for hand, wrist, elbow ROM only Okay to stay another day for pain control/OT, if patient wishes to d/c today please call Home pain rx already filled from Dr. Hardin Negus FU Dr. Tamera Punt in 2 weeks  VTE proph: SCDs

## 2014-12-29 NOTE — Discharge Instructions (Signed)
Discharge Instructions after Reverse Total Shoulder Arthroplasty   A sling has been provided for you. You are to where this at all times, even while sleeping, until your first post operative visit with Dr. Tamera Punt. Use ice on the shoulder intermittently over the first 48 hours after surgery.  Pain medicine has been prescribed for you.  Use your medicine liberally over the first 48 hours, and then you can begin to taper your use. You may take Extra Strength Tylenol or Tylenol only in place of the pain pills. DO NOT take ANY nonsteroidal anti-inflammatory pain medications: Advil, Motrin, Ibuprofen, Aleve, Naproxen or Naprosyn.  Your dressing is waterproof, you should leave it on until your first visit with Dr. Tamera Punt, you can shower with the dressing on. Hold your arm by your side in the shower like it is in a sling. Simply allow the water to wash over the site and then pat dry.Make sure your axilla (armpit) is completely dry after showering.    Please call [phone] during normal business hours or [phone] after hours for any problems. Including the following:  - excessive redness of the incisions - drainage for more than 4 days - fever of more than 101.5 F  *Please note that pain medications will not be refilled after hours or on weekends.

## 2014-12-29 NOTE — Anesthesia Postprocedure Evaluation (Signed)
  Anesthesia Post-op Note  Patient: Destiny [name]  Procedure(s) Performed: Procedure(s) with comments: REVERSE SHOULDER ARTHROPLASTY (Right) - Right reverse total shoulder arthroplasty  Patient Location: PACU  Anesthesia Type:General and GA combined with regional for post-op pain  Level of Consciousness: awake, oriented, sedated and patient cooperative  Airway and Oxygen Therapy: Patient Spontanous Breathing  Post-op Pain: mild  Post-op Assessment: Post-op Vital signs reviewed, Patient's Cardiovascular Status Stable, Respiratory Function Stable, Patent Airway, No signs of Nausea or vomiting and Pain level controlled  Post-op Vital Signs: stable  Last Vitals:  Filed Vitals:   12/29/14 0502  BP: 120/42  Pulse: 72  Temp: 36.7 C  Resp: 17    Complications: No apparent anesthesia complications

## 2014-12-30 MED ORDER — ACETAMINOPHEN 325 MG PO TABS: 325.0000 mg | ORAL_TABLET | Freq: Four times a day (QID) | ORAL | Status: DC | PRN

## 2014-12-30 MED ORDER — FLEET ENEMA 7-19 GM/118ML RE ENEM: 1.0000 | ENEMA | Freq: Once | RECTAL | Status: DC

## 2014-12-30 NOTE — Progress Notes (Signed)
   PATIENT ID: Destiny [name]   2 Days Post-Op Procedure(s) (LRB): REVERSE SHOULDER ARTHROPLASTY (Right)  Subjective: Pain improving last night. Hopes to d/c home today. Complaints of constipation, no BM x 3 days. Wishes to have an enema. No complaints or concerns.  Objective:  Filed Vitals:   12/30/14 0610  BP: 126/45  Pulse: 71  Temp: 98.3 F (36.8 C)  Resp:      R UE dresing c/d/i minimial swelling right hand Wiggles fingers, distally NVI  Labs:   Recent Labs  12/29/14 0535  HGB 9.8*   Recent Labs  12/29/14 0535  WBC 8.1  RBC 2.97*  HCT 29.6*  PLT 218   Recent Labs  12/29/14 0535  NA 133*  K 4.0  CL 100  CO2 25  BUN 9  CREATININE 0.88  GLUCOSE 125*  CALCIUM 8.0*    Assessment and Plan: 2 days s/p right reverse TSA OT to work with again today D/c home today Pain mgmt per Dr. Hardin Negus, has pain rx at home Wishes for enema today prior to d/c if no BM, prn order in chart Fu with Dr. Tamera Punt in 2 weeks  VTE proph: SCDs

## 2014-12-30 NOTE — Progress Notes (Signed)
Occupational Therapy Treatment Patient Details Name: Destiny [name] MRN: [id number] DOB: November 13, 1941 Today's Date: 12/30/2014    History of present illness s/p R reverse TSA   OT comments  Reinforced shoulder positioning, sling use, bathing and dressing and standing activities at sink.  Performed R elbow to hand exercises in supine.  Husband participating in session.  Pt ready for d/c.  Follow Up Recommendations  Other (comment) (follow up with MD)    Equipment Recommendations       Recommendations for Other Services      Precautions / Restrictions Precautions Precautions: Fall;Shoulder Type of Shoulder Precautions: ROM elbow to hand only Shoulder Interventions: Shoulder sling/immobilizer;Off for dressing/bathing/exercises;At all times Precaution Comments: reviewed handout information with pt and husband Restrictions Weight Bearing Restrictions: Yes RUE Weight Bearing: Non weight bearing       Mobility Bed Mobility Overal bed mobility: Modified Independent                Transfers       Sit to Stand: Supervision         General transfer comment: supervision for ambulation    Balance                                   ADL Overall ADL's : Needs assistance/impaired     Grooming: Minimal assistance;Standing Grooming Details (indicate cue type and reason): assist to set up electric toothbrush         Upper Body Dressing : Moderate assistance;Sitting Upper Body Dressing Details (indicate cue type and reason): educated husband on compensatory technique Lower Body Dressing: Minimal assistance;Sit to/from stand   Toilet Transfer: Supervision/safety;Ambulation;Comfort height toilet   Toileting- Clothing Manipulation and Hygiene: Supervision/safety;Sit to/from stand       Functional mobility during ADLs: Supervision/safety General ADL Comments: Instructed husband in use of sling and in elbow to hand exercises in supine      Vision                      Perception     Praxis      Cognition   Behavior During Therapy: Anxious Overall Cognitive Status: Within Functional Limits for tasks assessed                       Extremity/Trunk Assessment               Exercises     Shoulder Instructions       General Comments      Pertinent Vitals/ Pain       Pain Assessment: Faces Faces Pain Scale: Hurts little more Pain Location: R shoulder Pain Descriptors / Indicators: Operative site guarding;Guarding;Sore Pain Intervention(s): Monitored during session;Repositioned  Home Living                                          Prior Functioning/Environment              Frequency       Progress Toward Goals  OT Goals(current goals can now be found in the care plan section)  Progress towards OT goals: Progressing toward goals  Acute Rehab OT Goals Patient Stated Goal: to go home  Plan Discharge plan remains appropriate    Co-evaluation  End of Session     Activity Tolerance Patient tolerated treatment well   Patient Left in bed;with family/visitor present;with call bell/phone within reach   Nurse Communication  (ready for d/c)        Time: 1105-1146 OT Time Calculation (min): 41 min  Charges: OT General Charges $OT Visit: 1 Procedure OT Treatments $Self Care/Home Management : 23-37 mins $Therapeutic Exercise: 8-22 mins  Malka So 12/30/2014, 12:11 PM  [phone]

## 2015-01-01 ENCOUNTER — Encounter (HOSPITAL_COMMUNITY): Payer: Self-pay | Admitting: Orthopedic Surgery

## 2015-01-01 NOTE — Discharge Summary (Signed)
Patient ID: Destiny [name] MRN: [id number] DOB/AGE: [birth year] 74 y.o.  Admit date: 12/28/2014 Discharge date: 12/30/2014  Admission Diagnoses:  Active Problems:   S/p reverse total shoulder arthroplasty   Discharge Diagnoses:  Same  Past Medical History  Diagnosis Date  . Colon polyps   . Complication of anesthesia     BP drops with fentyl and versed  . Hypertension   . Hypothyroidism   . Anxiety   . Arthritis   . Hepatitis     hep A  1976    Surgeries: Procedure(s): REVERSE SHOULDER ARTHROPLASTY on 12/28/2014   Consultants:    Discharged Condition: Improved  Hospital Course: Destiny [name] is an 74 y.o. female who was admitted 12/28/2014 for operative treatment of right rotator cuff arthropathy . Patient has severe unremitting pain that affects sleep, daily activities, and work/hobbies. After pre-op clearance the patient was taken to the operating room on 12/28/2014 and underwent  Procedure(s): [place].    Patient was given perioperative antibiotics:  Anti-infectives    Start     Dose/Rate Route Frequency Ordered Stop   12/28/14 1400  ceFAZolin (ANCEF) IVPB 1 g/50 mL premix     1 g100 mL/hr over 30 Minutes Intravenous Every 6 hours 12/28/14 1139 12/29/14 0156   12/28/14 1300  hydroxychloroquine (PLAQUENIL) tablet 200 mg  Status:  Discontinued     200 mg Oral 2 times daily 12/28/14 1139 12/30/14 1553   12/28/14 0600  clindamycin (CLEOCIN) IVPB 900 mg     900 mg100 mL/hr over 30 Minutes Intravenous On call to O.R. 12/27/14 1301 12/28/14 0740       Patient was given sequential compression devices, early ambulation to prevent DVT.  Patient benefited maximally from hospital stay and there were no complications.    Recent vital signs: No data found.    Recent laboratory studies: No results for input(s): WBC, HGB, HCT, PLT, NA, K, CL, CO2, BUN, CREATININE, GLUCOSE, INR, CALCIUM in the last 72 hours.  Invalid input(s): PT, 2   Discharge Medications:      Medication List    STOP taking these medications        HYDROcodone-acetaminophen 5-325 MG per tablet  Commonly known as:  NORCO/VICODIN     ibuprofen 800 MG tablet  Commonly known as:  ADVIL,MOTRIN      TAKE these medications        acetaminophen 325 MG tablet  Commonly known as:  TYLENOL  Take 1 tablet (325 mg total) by mouth every 6 (six) hours as needed (pain).     amLODipine 10 MG tablet  Commonly known as:  NORVASC  Take 5 mg by mouth daily.     amoxicillin 500 MG capsule  Commonly known as:  AMOXIL  Take 2,000 mg by mouth See admin instructions. Take 4 tablets (2000 mg) 1 hour prior to dental procedure     bisoprolol 5 MG tablet  Commonly known as:  ZEBETA  Take 5 mg by mouth at bedtime.     busPIRone 15 MG tablet  Commonly known as:  BUSPAR  Take 15 mg by mouth 2 (two) times daily after a meal.     Calcium Carb-Cholecalciferol 600-200 MG-UNIT Tabs  Take 1 tablet by mouth 2 (two) times daily.     cholecalciferol 1000 UNITS tablet  Commonly known as:  VITAMIN D  Take 2,000 Units by mouth daily.     diazepam 5 MG tablet  Commonly known as:  VALIUM  Take 5  mg by mouth at bedtime as needed (sleep).     diphenhydrAMINE 25 MG tablet  Commonly known as:  BENADRYL  Take 25 mg by mouth 3 times/day as needed-between meals & bedtime for sleep.     fluticasone 50 MCG/ACT nasal spray  Commonly known as:  FLONASE  Place 1 spray into both nostrils daily as needed (congestion).     gabapentin 300 MG capsule  Commonly known as:  NEURONTIN  Take 300-900 mg by mouth 2 (two) times daily. Take 1 capsule (300 mg) every morning and 3 capsules (900 mg) at bedtime     hydroxychloroquine 200 MG tablet  Commonly known as:  PLAQUENIL  Take 200 mg by mouth 2 (two) times daily.     levothyroxine 137 MCG tablet  Commonly known as:  SYNTHROID, LEVOTHROID  Take 137 mcg by mouth daily before breakfast.     loratadine-pseudoephedrine 5-120 MG per tablet  Commonly known as:   CLARITIN-D 12-hour  Take 1 tablet by mouth daily.     methocarbamol 750 MG tablet  Commonly known as:  ROBAXIN  Take 750 mg by mouth daily as needed for muscle spasms.     morphine 15 MG tablet  Commonly known as:  MSIR  Take 15 mg by mouth every 6 (six) hours as needed (pain).     oxazepam 15 MG capsule  Commonly known as:  SERAX  1-2 by mouth at bedtime as needed for insomnia     pravastatin 10 MG tablet  Commonly known as:  PRAVACHOL  Take 10 mg by mouth at bedtime.     zolpidem 10 MG tablet  Commonly known as:  AMBIEN  Take 5 mg by mouth at bedtime.        Diagnostic Studies: Dg Chest 2 View  12/19/2014   CLINICAL DATA:  Preop right shoulder replacement  EXAM: CHEST  2 VIEW  COMPARISON:  06/11/2011  FINDINGS: The heart size and mediastinal contours are within normal limits. Both lungs are clear. The visualized skeletal structures are unremarkable.  IMPRESSION: No active cardiopulmonary disease.   Electronically Signed   By: Kathreen Devoid   On: 12/19/2014 17:17   Dg Shoulder Right Port  12/28/2014   CLINICAL DATA:  Status post reversal total shoulder arthroplasty.  EXAM: PORTABLE RIGHT SHOULDER - 2+ VIEW  COMPARISON:  PA and lateral chest x-ray of December 19, 2014  FINDINGS: There is a prosthetic right shoulder joint. Radiographic positioning of the prosthetic components is good. The native bony structures exhibit no acute abnormalities. There is chronic widening of the AC joint. The observed portions of the right hemithorax are unremarkable.  IMPRESSION: There is a prosthetic right shoulder joint without radiographic abnormality.   Electronically Signed   By: David  Martinique   On: 12/28/2014 13:10    Disposition: 01-Home or Self Care      Discharge Instructions    Call MD / Call 911    Complete by:  As directed   If you experience chest pain or shortness of breath, CALL 911 and be transported to the hospital emergency room.  If you develope a fever above 101 F, pus (white  drainage) or increased drainage or redness at the wound, or calf pain, call your surgeon's office.     Constipation Prevention    Complete by:  As directed   Drink plenty of fluids.  Prune juice may be helpful.  You may use a stool softener, such as Colace (over the counter) 100 mg  twice a day.  Use MiraLax (over the counter) for constipation as needed.     Diet - low sodium heart healthy    Complete by:  As directed      Increase activity slowly as tolerated    Complete by:  As directed            Follow-up Information    Follow up with Nita Sells, MD. Schedule an appointment as soon as possible for a visit in 2 weeks.   Specialty:  Orthopedic Surgery   Contact information:   [address] [place] [address] [phone]        Signed: Grier Mitts 01/01/2015, 12:48 PM

## 2015-01-02 ENCOUNTER — Encounter (HOSPITAL_COMMUNITY): Payer: Self-pay | Admitting: Orthopedic Surgery

## 2015-10-04 ENCOUNTER — Other Ambulatory Visit: Payer: Self-pay

## 2015-10-04 DIAGNOSIS — Z1231 Encounter for screening mammogram for malignant neoplasm of breast: Secondary | ICD-10-CM

## 2015-10-11 ENCOUNTER — Ambulatory Visit: Payer: Medicare Other | Attending: Orthopedic Surgery

## 2015-10-11 DIAGNOSIS — M25661 Stiffness of right knee, not elsewhere classified: Secondary | ICD-10-CM | POA: Diagnosis present

## 2015-10-11 DIAGNOSIS — R269 Unspecified abnormalities of gait and mobility: Secondary | ICD-10-CM | POA: Diagnosis present

## 2015-10-11 DIAGNOSIS — M1710 Unilateral primary osteoarthritis, unspecified knee: Secondary | ICD-10-CM | POA: Diagnosis not present

## 2015-10-11 DIAGNOSIS — R600 Localized edema: Secondary | ICD-10-CM | POA: Insufficient documentation

## 2015-10-11 DIAGNOSIS — R29898 Other symptoms and signs involving the musculoskeletal system: Secondary | ICD-10-CM | POA: Insufficient documentation

## 2015-10-11 NOTE — Therapy (Signed)
[facility] [address] [place], [place], [address] Phone: [phone]   Fax:  [phone]  Physical Therapy Evaluation  Patient Details  Name: Destiny [name] MRN: [id number] Date of Birth: 01-22-1941 Referring Provider: Henreitta Leber, MD  Encounter Date: 10/11/2015      PT End of Session - 10/11/15 1301    Visit Number 1   Date for PT Re-Evaluation 01/10/16   PT Start Time 1220   PT Stop Time 1301   PT Time Calculation (min) 41 min   Activity Tolerance Patient tolerated treatment well   Behavior During Therapy [facility] for tasks assessed/performed      Past Medical History  Diagnosis Date  . Colon polyps   . Complication of anesthesia     BP drops with fentyl and versed  . Hypertension   . Hypothyroidism   . Anxiety   . Arthritis   . Hepatitis     hep A  1976    Past Surgical History  Procedure Laterality Date  . Thyroidectomy, partial      one lobe removed  . Abdominal hysterectomy    . Rotator cufff    . Appendectomy    . Cesarean section      times 2  . Foot surgery Bilateral   . Fracture surgery      broken ankle  . Medial partial knee replacement Left     done at [facility]  . Varicose vein surgery Right   . Back surgery       fusion  . Eye surgery Left     cataract removed  . Reverse total shoulder arthroplasty Right 12/28/2014    DR CHANDLER  . Reverse shoulder arthroplasty Right 12/28/2014    Procedure: REVERSE SHOULDER ARTHROPLASTY;  Surgeon: Nita Sells, MD;  Location: [place];  Service: Orthopedics;  Laterality: Right;  Right reverse total shoulder arthroplasty    There were no vitals filed for this visit.  Visit Diagnosis:  Primary osteoarthritis of one knee - Plan: PT plan of care cert/re-cert      Subjective Assessment - 10/11/15 1230    Subjective Pt presents to PT ~1 week pre op Rt partial knee replacement (medial compartment) scheduled 10/19/15.  Pt will return to  this clinic after surgery.  Today will serve as pre-op visit to learn exercises and determine baselines.     Pertinent History Rt reverse total shoulder 12/2014,Lt medial compartment total knee replacement 11/2012   Limitations Sitting;Standing   How long can you sit comfortably? 30 minutes   How long can you stand comfortably? 30 minutes   How long can you walk comfortably? 30 minutes   Diagnostic tests MRI- OA of Rt knee   Currently in Pain? Yes   Pain Score 3   3-8/10 during the day   Pain Location Knee   Pain Orientation Right   Pain Descriptors / Indicators Burning   Pain Type Chronic pain   Pain Onset More than a month ago   Pain Frequency Constant   Aggravating Factors  standing and walking, sitting too long   Pain Relieving Factors rest, pain medication, ice            [facility] PT Assessment - 10/11/15 0001    Assessment   Medical Diagnosis primary OA of the Rt knee (M17.11)   Referring Provider Henreitta Leber, MD   Onset Date/Surgical Date 10/19/15  Total knee replacement   Next MD Visit surgery 10/19/15  Precautions   Precautions None   Restrictions   Weight Bearing Restrictions No   Balance Screen   Has the patient fallen in the past 6 months No   Has the patient had a decrease in activity level because of a fear of falling?  No   Is the patient reluctant to leave their home because of a fear of falling?  No   Home [place] residence   Living Arrangements Spouse/significant other   [place] to enter   Entrance Stairs-Number of Steps 0   Home Layout One level   Prior Function   Level of [place] Retired   Leisure Pt reads   Cognition   Overall Cognitive Status Within Functional Limits for tasks assessed   Observation/Other Assessments   Focus on Therapeutic Outcomes (FOTO)  54% limitation   Posture/Postural Control   Posture/Postural Control Postural limitations   Postural Limitations --   genu valgus Rt   ROM / Strength   AROM / PROM / Strength AROM;PROM;Strength   AROM   Overall AROM  Within functional limits for tasks performed   AROM Assessment Site Knee   Right/Left Knee Right;Left   Right Knee Extension 2   Right Knee Flexion 110   Left Knee Extension 3   Left Knee Flexion 114   Strength   Overall Strength Within functional limits for tasks performed   Strength Assessment Site Knee   Right/Left Knee Right;Left   Right Knee Flexion 4+/5   Right Knee Extension 4+/5   Left Knee Flexion 5/5   Left Knee Extension 5/5   Palpation   Patella mobility crepitus with patellar mobs on Rt   Palpation comment palpable tenderness over Rt medial knee joint   Transfers   Transfers Sit to Stand;Stand to Sit   Sit to Stand 7: Independent   Ambulation/Gait   Ambulation/Gait Yes   Ambulation/Gait Assistance 7: Independent   Ambulation Distance (Feet) 100 Feet   Gait Pattern Step-through pattern;Decreased stride length;Decreased stance time - right                           PT Education - 10/11/15 1255    Education provided Yes   Education Details HEP: ROM exercises for post-op   Person(s) Educated Patient   Methods Explanation;Demonstration;Handout   Comprehension Verbalized understanding;Returned demonstration          PT Short Term Goals - 10/11/15 1305    PT SHORT TERM GOAL #1   Title be independent in HEP for after surgery   Time 1   Period Days   Status Achieved   PT SHORT TERM GOAL #2   Title New goals to be set after surgery   Time 1   Period Days   Status New                  Plan - 10/11/15 1301    Clinical Impression Statement Pt presents to PT 1 week prior to Rt medial compartment partial knee replacement surgery.  Today's visit was for pre-op consultation and baseline measures.  Pt was issued with HEP for post-op.  Pt will return to PT 4 days after surgery and will receive re-evaluation at that time and new goals  set.  Pt will benefit from skilled PT after surgery for ROM, strength, gait and edema management.     Pt will benefit from skilled therapeutic intervention in  order to improve on the following deficits Abnormal gait;Decreased strength;Impaired flexibility   Rehab Potential Good   PT Frequency 2x / week   PT Duration 12 weeks   PT Treatment/Interventions ADLs/Self Care Home Management;Cryotherapy;Electrical Stimulation;Therapeutic exercise;Therapeutic activities;Functional mobility training;Manual techniques;Vasopneumatic Device;Passive range of motion;Scar mobilization;Gait training;Stair training;Patient/family education;Neuromuscular re-education;Ultrasound;Moist Heat   PT Next Visit Plan Reassess after surgery, new G-codes, set long term goals based on new status   Consulted and Agree with Plan of Care Patient          G-Codes - 2015/11/08 Mar 23, 1217    Functional Assessment Tool Used FOTO: 54% limitation   Functional Limitation Mobility: Walking and moving around   Mobility: Walking and Moving Around Current Status [phone]) At least 40 percent but less than 60 percent impaired, limited or restricted   Mobility: Walking and Moving Around Goal Status [phone]) At least 40 percent but less than 60 percent impaired, limited or restricted   Mobility: Walking and Moving Around Discharge Status [phone]) At least 40 percent but less than 60 percent impaired, limited or restricted       Problem List Patient Active Problem List   Diagnosis Date Noted  . S/p reverse total shoulder arthroplasty 12/28/2014  . Abnormality of gait 04/25/2014  . Foot pain 06/03/2011  . Metatarsalgia 06/03/2011  . CHRONIC PAIN SYNDROME 12/05/2009  . INSOMNIA 08/15/2009  . INGROWN TOENAIL 06/06/2009  . LEG PAIN, BILATERAL 06/05/2009  . OTHER VITAMIN B12 DEFICIENCY ANEMIA 04/04/2009  . MALAISE AND FATIGUE 02/28/2009  . UNSPECIFIED HYPOTHYROIDISM 07/26/2008  . HYPERLIPIDEMIA 11/05/2007  . NEUROPATHY, IDIOPATHIC PERIPHERAL  NEC 09/16/2007  . HYPERTENSION 01/28/2007  . OSTEOARTHRITIS 01/28/2007  . ROTATOR CUFF INJURY, RIGHT SHOULDER 01/28/2007  . OSTEOPENIA 01/28/2007    Siedah Sedor, PT 2015/11/08, 1:08 PM   [facility] [address], [place], [place], [address] Phone: [phone]   Fax:  [phone]  Name: Destiny [name] MRN: [id number] Date of Birth: 08-Jul-1941

## 2015-10-11 NOTE — Patient Instructions (Signed)
Hold stretches 10 seconds, do 10 reps, 3 times a day  Chair Knee Flexion   Keeping feet on floor, slide foot of operated leg back, bending knee. Hold ____ seconds. Repeat ____ times. Do ____ sessions a day.  Heel Slide   Bend left knee and pull heel toward buttocks. Use strap around foot and pull strap with arms to assist knee to bend further. Hold 10 secs.  Repeat ____ times. Do ____ sessions per day.  Knee Wall Slide   Slowly "walk" or slide feet on wall toward floor until stretch is felt in knees. Repeat ____ times per set. Do ____ sets per session. Do ____ sessions per day.   HIP: Hamstrings - Short Sitting    Rest leg on raised surface. Keep knee straight. Lift chest. Hold _20__ seconds. __3_ reps per set, _3__ sets per day  Copyright  VHI. All rights reserved.  Quad Set    With other leg bent, foot flat, slowly tighten muscles on thigh of straight leg while counting out loud to _5___. Repeat with other leg. Repeat __10__ times. Do _3-4___ sessions per day.  http://gt2.exer.us/276   Copyright  VHI. All rights reserved.  [facility] [address], [place], [place] [address] Phone # [phone] Fax [phone]

## 2015-10-16 ENCOUNTER — Ambulatory Visit
Admission: RE | Admit: 2015-10-16 | Discharge: 2015-10-16 | Disposition: A | Discharge status: Home / Self Care | Payer: Medicare Other | Source: Ambulatory Visit

## 2015-10-16 DIAGNOSIS — Z1231 Encounter for screening mammogram for malignant neoplasm of breast: Secondary | ICD-10-CM

## 2015-10-22 ENCOUNTER — Ambulatory Visit: Payer: Medicare Other

## 2015-10-22 DIAGNOSIS — M1710 Unilateral primary osteoarthritis, unspecified knee: Secondary | ICD-10-CM | POA: Diagnosis not present

## 2015-10-22 DIAGNOSIS — R269 Unspecified abnormalities of gait and mobility: Secondary | ICD-10-CM

## 2015-10-22 DIAGNOSIS — R29898 Other symptoms and signs involving the musculoskeletal system: Secondary | ICD-10-CM

## 2015-10-22 DIAGNOSIS — M25661 Stiffness of right knee, not elsewhere classified: Secondary | ICD-10-CM

## 2015-10-22 DIAGNOSIS — R600 Localized edema: Secondary | ICD-10-CM

## 2015-10-22 NOTE — Therapy (Signed)
[facility] [address] [place], [place], [address] Phone: [phone]   Fax:  [phone]  Physical Therapy Evaluation  Patient Details  Name: Destiny [name] MRN: [id number] Date of Birth: 04-11-1941 Referring Provider: Henreitta Leber, MD  Encounter Date: 10/22/2015      PT End of Session - 10/22/15 1312    Visit Number 2   Number of Visits 11   Date for PT Re-Evaluation 12/17/15   PT Start Time [id number]   PT Stop Time 1316   PT Time Calculation (min) 46 min   Activity Tolerance Patient tolerated treatment well   Behavior During Therapy [facility] for tasks assessed/performed      Past Medical History  Diagnosis Date  . Colon polyps   . Complication of anesthesia     BP drops with fentyl and versed  . Hypertension   . Hypothyroidism   . Anxiety   . Arthritis   . Hepatitis     hep A  1976    Past Surgical History  Procedure Laterality Date  . Thyroidectomy, partial      one lobe removed  . Abdominal hysterectomy    . Rotator cufff    . Appendectomy    . Cesarean section      times 2  . Foot surgery Bilateral   . Fracture surgery      broken ankle  . Medial partial knee replacement Left     done at [facility]  . Varicose vein surgery Right   . Back surgery       fusion  . Eye surgery Left     cataract removed  . Reverse total shoulder arthroplasty Right 12/28/2014    DR CHANDLER  . Reverse shoulder arthroplasty Right 12/28/2014    Procedure: REVERSE SHOULDER ARTHROPLASTY;  Surgeon: Nita Sells, MD;  Location: [place];  Service: Orthopedics;  Laterality: Right;  Right reverse total shoulder arthroplasty    There were no vitals filed for this visit.  Visit Diagnosis:  Primary osteoarthritis of one knee  Knee stiffness, right  Leg edema, right  Abnormality of gait  Weakness of right leg      Subjective Assessment - 10/22/15 1238    Subjective Pt presents to PT s/p Rt unicompartment  knee replacement performed 10/19/15.  Pt was in hospital for one night.     Pertinent History Rt reverse total shoulder 12/2014,Lt medial compartment total knee replacement 11/2012   Limitations Standing;Walking   How long can you stand comfortably? < 5 minutes   How long can you walk comfortably? 5 minutes max   Currently in Pain? Yes   Pain Score 6    Pain Location Knee   Pain Orientation Right   Pain Descriptors / Indicators Sore;Tightness   Pain Type Surgical pain   Pain Onset In the past 7 days   Pain Frequency Constant   Aggravating Factors  standing, walking, bending the knee    Pain Relieving Factors rest, pain medication, ice            [facility] PT Assessment - 10/22/15 0001    Assessment   Medical Diagnosis primary OA of the Rt knee (M17.11), s/p unicompartment knee replacment on the Rt   Onset Date/Surgical Date 10/19/15   Next MD Visit 10/31/15   Precautions   Precautions None   Restrictions   Weight Bearing Restrictions No   Balance Screen   Has the patient fallen in the past 6 months No  Has the patient had a decrease in activity level because of a fear of falling?  No   Is the patient reluctant to leave their home because of a fear of falling?  No   Home [employer] residence   Living Arrangements Spouse/significant other   Home Access Stairs to enter   Entrance Stairs-Number of Steps 0   Home Layout One level   [place] - 2 wheels   Prior Function   Level of [place] Retired   Leisure Pt reads   Cognition   Overall Cognitive Status Within Functional Limits for tasks assessed   Observation/Other Assessments   Focus on Therapeutic Outcomes (FOTO)  76% limitation   Posture/Postural Control   Posture/Postural Control No significant limitations   ROM / Strength   AROM / PROM / Strength AROM   AROM   Overall AROM  Deficits   AROM Assessment Site Knee   Right/Left Knee Right;Left   Right  Knee Extension 8   Right Knee Flexion 61   Left Knee Extension 3   Left Knee Flexion 114   Strength   Strength Assessment Site Knee   Right/Left Knee Right;Left   Right Knee Flexion 4/5   Right Knee Extension 4/5   Left Knee Flexion 5/5   Left Knee Extension 5/5   Palpation   Patella mobility reduced and painful patellar mobility on the Rt   Palpation comment warmth and edema over Rt medial and lateral joint line.  Mid patellar circumference 17.5 inches, 15 inches Lt   Transfers   Transfers Sit to Stand   Sit to Stand 6: Modified independent (Device/Increase time);With upper extremity assist   Ambulation/Gait   Ambulation/Gait Yes   Ambulation/Gait Assistance 6: Modified independent (Device/Increase time)   Ambulation Distance (Feet) 100 Feet   Assistive device Rolling walker   Gait Pattern Step-to pattern   Curb 6: Modified independent (Device/increase time)                   [facility] Adult PT Treatment/Exercise - 10/22/15 0001    Exercises   Exercises Knee/Hip   Knee/Hip Exercises: Supine   Quad Sets AAROM;Strengthening;Right;2 sets;5 reps   Knee Flexion AAROM;Right;1 set;10 reps   Modalities   Modalities Vasopneumatic   Vasopneumatic   Number Minutes Vasopneumatic  15 minutes   Vasopnuematic Location  Knee   Vasopneumatic Pressure Medium   Vasopneumatic Temperature  3 snowflakes                  PT Short Term Goals - 10/22/15 1309    PT SHORT TERM GOAL #1   Title be independent in HEP for after surgery   Status Achieved   PT SHORT TERM GOAL #2   Title be independent in initial HEP   Time 4   Period Weeks   Status New   PT SHORT TERM GOAL #3   Title demonstrate Rt knee AROM flexio nto > 95 degrees to sit without substitution   Time 4   Period Weeks   Status New   PT SHORT TERM GOAL #4   Title reduce Rt knee pain to < or = to 4/10 with standing activity   Time 4   Period Weeks   Status New   PT SHORT TERM GOAL #5   Title stand and walk  for 10 minutes without rest to improve independence at home   Time 4   Period Weeks  Status New           PT Long Term Goals - 19-Nov-2015 1232    PT LONG TERM GOAL #1   Title be independent in advanced HEP   Time 8   Period Weeks   Status New   PT LONG TERM GOAL #2   Title reduce FOTO to < or = to 45% limitaiton   Time 8   Period Weeks   Status New   PT LONG TERM GOAL #3   Title demonstrate Rt knee AROM flexion to > or = to 110 degrees to allow for descending steps and squatting with ease   Time 8   Period Weeks   Status New   PT LONG TERM GOAL #4   Title wean from walker to use of cane or no device for all distances   Time 8   Period Weeks   Status New   PT LONG TERM GOAL #5   Title stand and walk for 20 minutes without rest for independence in the home and community   Time 8   Period Weeks   Status New   Additional Long Term Goals   Additional Long Term Goals Yes   PT LONG TERM GOAL #6   Title improve Rt LE strength to ascend curbs with the Rt LE   Time 8   Period Weeks   PT LONG TERM GOAL #7   Title report < or = to 3/10 Rt knee pain with standing and walking   Time 8   Period Weeks   Status New               Plan - 19-Nov-2015 1258    Clinical Impression Statement Pt presents 3 days s/p Rt unicompartmental knee replacement.  Pt ambulates with a rolling walker with step-to gait, ascends curbs with step to gait and Rt knee AROM is 8-60 degrees today.  Pt with 2 inches of edema in the Rt knee vs the LT.  FOTO is 76% limitation.  Pt will benefit from skilled PT for edeme management, AROM progression and gait training so pt can return to prior level of function.     Pt will benefit from skilled therapeutic intervention in order to improve on the following deficits Abnormal gait;Decreased strength;Impaired flexibility;Increased edema;Pain;Decreased activity tolerance;Decreased range of motion;Decreased scar mobility;Difficulty walking   Rehab Potential Good   PT  Frequency 3x / week   PT Duration 8 weeks   PT Treatment/Interventions ADLs/Self Care Home Management;Cryotherapy;Electrical Stimulation;Therapeutic exercise;Therapeutic activities;Functional mobility training;Manual techniques;Vasopneumatic Device;Passive range of motion;Scar mobilization;Gait training;Stair training;Patient/family education;Neuromuscular re-education;Ultrasound;Moist Heat   PT Next Visit Plan Edema management, AROM progression, gait training   Consulted and Agree with Plan of Care Patient          G-Codes - 2015-11-19 1231    Functional Assessment Tool Used FOTO: 76% limitation   Functional Limitation Mobility: Walking and moving around   Mobility: Walking and Moving Around Current Status [phone]) At least 60 percent but less than 80 percent impaired, limited or restricted   Mobility: Walking and Moving Around Goal Status [phone]) At least 40 percent but less than 60 percent impaired, limited or restricted       Problem List Patient Active Problem List   Diagnosis Date Noted  . S/p reverse total shoulder arthroplasty 12/28/2014  . Abnormality of gait 04/25/2014  . Foot pain 06/03/2011  . Metatarsalgia 06/03/2011  . CHRONIC PAIN SYNDROME 12/05/2009  . INSOMNIA 08/15/2009  . INGROWN TOENAIL 06/06/2009  .  LEG PAIN, BILATERAL 06/05/2009  . OTHER VITAMIN B12 DEFICIENCY ANEMIA 04/04/2009  . MALAISE AND FATIGUE 02/28/2009  . UNSPECIFIED HYPOTHYROIDISM 07/26/2008  . HYPERLIPIDEMIA 11/05/2007  . NEUROPATHY, IDIOPATHIC PERIPHERAL NEC 09/16/2007  . HYPERTENSION 01/28/2007  . OSTEOARTHRITIS 01/28/2007  . ROTATOR CUFF INJURY, RIGHT SHOULDER 01/28/2007  . OSTEOPENIA 01/28/2007    Oran Dillenburg, PT 10/22/2015, 1:15 PM  [facility] [address] [place], [place], [address] Phone: [phone]   Fax:  [phone]  Name: Destiny [name] MRN: [id number] Date of Birth: Jul 23, 1941

## 2015-10-25 ENCOUNTER — Ambulatory Visit: Payer: Medicare Other | Attending: Orthopedic Surgery | Admitting: Physical Therapy

## 2015-10-25 DIAGNOSIS — M1710 Unilateral primary osteoarthritis, unspecified knee: Secondary | ICD-10-CM | POA: Insufficient documentation

## 2015-10-25 DIAGNOSIS — R269 Unspecified abnormalities of gait and mobility: Secondary | ICD-10-CM | POA: Insufficient documentation

## 2015-10-25 DIAGNOSIS — R29898 Other symptoms and signs involving the musculoskeletal system: Secondary | ICD-10-CM | POA: Diagnosis present

## 2015-10-25 DIAGNOSIS — M25661 Stiffness of right knee, not elsewhere classified: Secondary | ICD-10-CM | POA: Diagnosis present

## 2015-10-25 DIAGNOSIS — R600 Localized edema: Secondary | ICD-10-CM | POA: Diagnosis present

## 2015-10-25 NOTE — Therapy (Signed)
[facility] [address] [place], [place], [address] Phone: [phone]   Fax:  [phone]  Physical Therapy Treatment  Patient Details  Name: Destiny [name] MRN: [id number] Date of Birth: March 21, 1941 Referring Provider: Henreitta Leber, MD  Encounter Date: 10/25/2015      PT End of Session - 10/25/15 1132    Activity Tolerance Patient tolerated treatment well   Behavior During Therapy [facility] for tasks assessed/performed      Past Medical History  Diagnosis Date  . Colon polyps   . Complication of anesthesia     BP drops with fentyl and versed  . Hypertension   . Hypothyroidism   . Anxiety   . Arthritis   . Hepatitis     hep A  1976    Past Surgical History  Procedure Laterality Date  . Thyroidectomy, partial      one lobe removed  . Abdominal hysterectomy    . Rotator cufff    . Appendectomy    . Cesarean section      times 2  . Foot surgery Bilateral   . Fracture surgery      broken ankle  . Medial partial knee replacement Left     done at [facility]  . Varicose vein surgery Right   . Back surgery       fusion  . Eye surgery Left     cataract removed  . Reverse total shoulder arthroplasty Right 12/28/2014    DR CHANDLER  . Reverse shoulder arthroplasty Right 12/28/2014    Procedure: REVERSE SHOULDER ARTHROPLASTY;  Surgeon: Nita Sells, MD;  Location: [place];  Service: Orthopedics;  Laterality: Right;  Right reverse total shoulder arthroplasty    There were no vitals filed for this visit.  Visit Diagnosis:  Primary osteoarthritis of one knee  Knee stiffness, right  Leg edema, right  Abnormality of gait  Weakness of right leg      Subjective Assessment - 10/25/15 1103    Subjective I've been walking around and doing light housework at home, has not done any HEP. Stitches out next tuesday at [facility]   Pertinent History Rt reverse total shoulder 12/2014,Lt medial compartment total  knee replacement 11/2012   Currently in Pain? Yes   Pain Score 5    Pain Location Knee   Pain Orientation Right   Pain Descriptors / Indicators Sore   Pain Onset In the past 7 days                         [facility] Adult PT Treatment/Exercise - 10/25/15 0001    Knee/Hip Exercises: Aerobic   Nustep for AAROM x  6 minutes   Knee/Hip Exercises: Supine   Quad Sets AAROM;Strengthening;Right;10 reps   Heel Slides AROM;Right;20 reps   Other Supine Knee/Hip Exercises SAQ x 20   Other Supine Knee/Hip Exercises hip abduction x 20 with AAROM   Vasopneumatic   Number Minutes Vasopneumatic  15 minutes   Vasopnuematic Location  Knee   Vasopneumatic Pressure Medium   Vasopneumatic Temperature  3 snowflakes                  PT Short Term Goals - 10/25/15 1134    PT SHORT TERM GOAL #1   Title be independent in HEP for after surgery   Time 1   Period Days   Status Achieved   PT SHORT TERM GOAL #2   Title be  independent in initial HEP   Time 4   Period Weeks   Status On-going   PT SHORT TERM GOAL #3   Title demonstrate Rt knee AROM flexio nto > 95 degrees to sit without substitution   Time 4   Period Weeks   Status On-going   PT SHORT TERM GOAL #4   Title reduce Rt knee pain to < or = to 4/10 with standing activity   Time 4   Period Weeks   Status On-going   PT SHORT TERM GOAL #5   Title stand and walk for 10 minutes without rest to improve independence at home   Time 4   Period Weeks   Status On-going           PT Long Term Goals - 10/25/15 1134    PT LONG TERM GOAL #1   Title be independent in advanced HEP   Time 8   Period Weeks   Status On-going   PT LONG TERM GOAL #2   Title reduce FOTO to < or = to 45% limitaiton   Time 8   Period Weeks   Status On-going   PT LONG TERM GOAL #3   Title demonstrate Rt knee AROM flexion to > or = to 110 degrees to allow for descending steps and squatting with ease   Time 8   Period Weeks   Status On-going    PT LONG TERM GOAL #4   Title wean from walker to use of cane or no device for all distances   Time 8   Period Weeks   Status On-going   PT LONG TERM GOAL #5   Title stand and walk for 20 minutes without rest for independence in the home and community   Time 8   Period Weeks   Status On-going   PT LONG TERM GOAL #6   Title improve Rt LE strength to ascend curbs with the Rt LE   Time 8   Period Weeks   Status On-going   PT LONG TERM GOAL #7   Title report < or = to 3/10 Rt knee pain with standing and walking   Time 8   Period Weeks   Status On-going               Plan - 10/25/15 1132    Clinical Impression Statement Pt with improved mobility today vs last visit. Still gait with RW but states she has used [facility] around the house some.  Pt with difficulty with quad sets due to weakness, requires tactile cues for quad contraction. Pt improved AAROM with use of nu step.   Pt will benefit from skilled therapeutic intervention in order to improve on the following deficits Abnormal gait;Decreased strength;Impaired flexibility;Increased edema;Pain;Decreased activity tolerance;Decreased range of motion;Decreased scar mobility;Difficulty walking   Rehab Potential Good   PT Frequency 3x / week   PT Duration 8 weeks   PT Treatment/Interventions ADLs/Self Care Home Management;Cryotherapy;Electrical Stimulation;Therapeutic exercise;Therapeutic activities;Functional mobility training;Manual techniques;Vasopneumatic Device;Passive range of motion;Scar mobilization;Gait training;Stair training;Patient/family education;Neuromuscular re-education;Ultrasound;Moist Heat   PT Next Visit Plan progress strength and AAROM, gait training   Consulted and Agree with Plan of Care Patient        Problem List Patient Active Problem List   Diagnosis Date Noted  . S/p reverse total shoulder arthroplasty 12/28/2014  . Abnormality of gait 04/25/2014  . Foot pain 06/03/2011  . Metatarsalgia 06/03/2011   . CHRONIC PAIN SYNDROME 12/05/2009  . INSOMNIA 08/15/2009  . INGROWN  TOENAIL 06/06/2009  . LEG PAIN, BILATERAL 06/05/2009  . OTHER VITAMIN B12 DEFICIENCY ANEMIA 04/04/2009  . MALAISE AND FATIGUE 02/28/2009  . UNSPECIFIED HYPOTHYROIDISM 07/26/2008  . HYPERLIPIDEMIA 11/05/2007  . NEUROPATHY, IDIOPATHIC PERIPHERAL NEC 09/16/2007  . HYPERTENSION 01/28/2007  . OSTEOARTHRITIS 01/28/2007  . ROTATOR CUFF INJURY, RIGHT SHOULDER 01/28/2007  . OSTEOPENIA 01/28/2007   Isabelle Course, PT, DPT 10/25/2015, 11:36 AM   [facility] [address] [place], [place], [address] Phone: [phone]   Fax:  [phone]  Name: Destiny [name] MRN: [id number] Date of Birth: 07/24/1941

## 2015-10-30 ENCOUNTER — Ambulatory Visit: Payer: Medicare Other

## 2015-10-30 DIAGNOSIS — R600 Localized edema: Secondary | ICD-10-CM

## 2015-10-30 DIAGNOSIS — M1710 Unilateral primary osteoarthritis, unspecified knee: Secondary | ICD-10-CM | POA: Diagnosis not present

## 2015-10-30 DIAGNOSIS — R269 Unspecified abnormalities of gait and mobility: Secondary | ICD-10-CM

## 2015-10-30 DIAGNOSIS — M25661 Stiffness of right knee, not elsewhere classified: Secondary | ICD-10-CM

## 2015-10-30 DIAGNOSIS — R29898 Other symptoms and signs involving the musculoskeletal system: Secondary | ICD-10-CM

## 2015-10-30 NOTE — Patient Instructions (Signed)
   Copyright  VHI. All rights reserved.    ABDUCTION: Standing (Active)   Stand, feet flat. Lift right leg out to side. Use _0__ lbs. Complete __10_ repetitions. Perform __2_ sessions per day.       EXTENSION: Standing (Active)  Stand, both feet flat. Draw right leg behind body as far as possible. Use 0___ lbs. Complete 10 repetitions. Perform __2_ sessions per day.  Copyright  VHI. All rights reserved.   Mini Squat: Double Leg    With feet shoulder width apart, reach forward for balance and do a mini squat. Keep knees in line with second toe. Knees do not go past toes. Repeat 2x10__ times per set. Rest ___ seconds after set. Do ___ sets per session.  http://plyo.exer.us/70   Copyright  VHI. All rights reserved.  Anterior Step-Up    Stand with __6-8 Elizebeth Koller with ground foot in touch balance on step and return _2x10__ times. 2 x/day  http://gglj.exer.us/161   Copyright  VHI. All rights reserved.  [facility] [address] [place], [place] [address] Phone # [phone] Fax [phone]

## 2015-10-30 NOTE — Therapy (Signed)
[facility] [address] [place], [place], [address] Phone: [phone]   Fax:  [phone]  Physical Therapy Treatment  Patient Details  Name: Destiny [name] MRN: [id number] Date of Birth: [birth year] Referring Provider: Henreitta Leber, MD  Encounter Date: 10/30/2015      PT End of Session - 10/30/15 1058    Visit Number 4   Number of Visits 11   Date for PT Re-Evaluation 12/17/15   PT Start Time [id number]   PT Stop Time 1110   PT Time Calculation (min) 55 min   Activity Tolerance Patient tolerated treatment well   Behavior During Therapy [facility] for tasks assessed/performed      Past Medical History  Diagnosis Date  . Colon polyps   . Complication of anesthesia     BP drops with fentyl and versed  . Hypertension   . Hypothyroidism   . Anxiety   . Arthritis   . Hepatitis     hep A  1976    Past Surgical History  Procedure Laterality Date  . Thyroidectomy, partial      one lobe removed  . Abdominal hysterectomy    . Rotator cufff    . Appendectomy    . Cesarean section      times 2  . Foot surgery Bilateral   . Fracture surgery      broken ankle  . Medial partial knee replacement Left     done at [facility]  . Varicose vein surgery Right   . Back surgery       fusion  . Eye surgery Left     cataract removed  . Reverse total shoulder arthroplasty Right 12/28/2014    DR CHANDLER  . Reverse shoulder arthroplasty Right 12/28/2014    Procedure: REVERSE SHOULDER ARTHROPLASTY;  Surgeon: Nita Sells, MD;  Location: [place];  Service: Orthopedics;  Laterality: Right;  Right reverse total shoulder arthroplasty    There were no vitals filed for this visit.  Visit Diagnosis:  Knee stiffness, right  Leg edema, right  Abnormality of gait  Weakness of right leg      Subjective Assessment - 10/30/15 1022    Subjective Pt is walking with cane now.  "I'm doing better"     Pertinent History Rt  reverse total shoulder 12/2014,Lt medial compartment total knee replacement 11/2012   Currently in Pain? Yes   Pain Score 3    Pain Location Knee   Pain Orientation Right   Pain Descriptors / Indicators Tightness   Pain Type Surgical pain   Aggravating Factors  standing, walking, bending the knee   Pain Relieving Factors rest, elevation, meds, ice            [facility] PT Assessment - 10/30/15 0001    ROM / Strength   AROM / PROM / Strength AROM   AROM   Overall AROM  Deficits   AROM Assessment Site Knee   Right Knee Extension 4   Right Knee Flexion 90                     [facility] Adult PT Treatment/Exercise - 10/30/15 0001    Knee/Hip Exercises: Aerobic   Nustep Level 1x 9 minutes  seat 6, arms 8   Knee/Hip Exercises: Standing   Hip Abduction Both;Stengthening;2 sets;10 reps   Hip Extension Stengthening;Both;2 sets;10 reps   Forward Step Up Both;2 sets;10 reps;Hand Hold: 2;Step Height: 6"   Rebounder weight shifting  3 ways x 1 minute each   Other Standing Knee Exercises mini squats 2x10   Modalities   Modalities Vasopneumatic   Vasopneumatic   Number Minutes Vasopneumatic  15 minutes   Vasopnuematic Location  Knee   Vasopneumatic Pressure Medium   Vasopneumatic Temperature  3 snowflakes                  PT Short Term Goals - 10/30/15 1024    PT SHORT TERM GOAL #1   Title be independent in HEP for after surgery   Status Achieved   PT SHORT TERM GOAL #2   Title be independent in initial HEP   Status Achieved   PT SHORT TERM GOAL #3   Title demonstrate Rt knee AROM flexio nto > 95 degrees to sit without substitution   PT SHORT TERM GOAL #4   Title reduce Rt knee pain to < or = to 4/10 with standing activity   Time 4   Period Weeks   Status On-going  up to 6/10    PT SHORT TERM GOAL #5   Title stand and walk for 10 minutes without rest to improve independence at home   Time 4   Period Weeks   Status On-going  havent tried this as she has not  been in the community            PT Long Term Goals - 10/25/15 1134    PT LONG TERM GOAL #1   Title be independent in advanced HEP   Time 8   Period Weeks   Status On-going   PT LONG TERM GOAL #2   Title reduce FOTO to < or = to 45% limitaiton   Time 8   Period Weeks   Status On-going   PT LONG TERM GOAL #3   Title demonstrate Rt knee AROM flexion to > or = to 110 degrees to allow for descending steps and squatting with ease   Time 8   Period Weeks   Status On-going   PT LONG TERM GOAL #4   Title wean from walker to use of cane or no device for all distances   Time 8   Period Weeks   Status On-going   PT LONG TERM GOAL #5   Title stand and walk for 20 minutes without rest for independence in the home and community   Time 8   Period Weeks   Status On-going   PT LONG TERM GOAL #6   Title improve Rt LE strength to ascend curbs with the Rt LE   Time 8   Period Weeks   Status On-going   PT LONG TERM GOAL #7   Title report < or = to 3/10 Rt knee pain with standing and walking   Time 8   Period Weeks   Status On-going               Plan - 10/30/15 1026    Clinical Impression Statement Pt is walking well with cane for all distances.  Pt has started doing standing exercises at home and is tolerating well.  Pt with quad weakness and limited AROM s/p surgery.  Pt with continued edema and manages with elevation and ice.  Pt will continue to benefit from  skilled PT for Rt knee strength, endurance and gait training for return to prior level of function.     Pt will benefit from skilled therapeutic intervention in order to improve on the following deficits Abnormal gait;Decreased  strength;Impaired flexibility;Increased edema;Pain;Decreased activity tolerance;Decreased range of motion;Decreased scar mobility;Difficulty walking   Rehab Potential Good   PT Frequency 3x / week   PT Duration 8 weeks   PT Treatment/Interventions ADLs/Self Care Home  Management;Cryotherapy;Electrical Stimulation;Therapeutic exercise;Therapeutic activities;Functional mobility training;Manual techniques;Vasopneumatic Device;Passive range of motion;Scar mobilization;Gait training;Stair training;Patient/family education;Neuromuscular re-education;Ultrasound;Moist Heat   PT Next Visit Plan progress strength and AAROM, gait training   Consulted and Agree with Plan of Care Patient        Problem List Patient Active Problem List   Diagnosis Date Noted  . S/p reverse total shoulder arthroplasty 12/28/2014  . Abnormality of gait 04/25/2014  . Foot pain 06/03/2011  . Metatarsalgia 06/03/2011  . CHRONIC PAIN SYNDROME 12/05/2009  . INSOMNIA 08/15/2009  . INGROWN TOENAIL 06/06/2009  . LEG PAIN, BILATERAL 06/05/2009  . OTHER VITAMIN B12 DEFICIENCY ANEMIA 04/04/2009  . MALAISE AND FATIGUE 02/28/2009  . UNSPECIFIED HYPOTHYROIDISM 07/26/2008  . HYPERLIPIDEMIA 11/05/2007  . NEUROPATHY, IDIOPATHIC PERIPHERAL NEC 09/16/2007  . HYPERTENSION 01/28/2007  . OSTEOARTHRITIS 01/28/2007  . ROTATOR CUFF INJURY, RIGHT SHOULDER 01/28/2007  . OSTEOPENIA 01/28/2007    Alya Smaltz, PT 10/30/2015, 11:00 AM  [facility] [address] [place], [place], [address] Phone: [phone]   Fax:  [phone]  Name: Destiny STFLEUR MRN: [id number] Date of Birth: Apr 15, 1941

## 2015-11-01 ENCOUNTER — Ambulatory Visit: Payer: Medicare Other | Admitting: Physical Therapy

## 2015-11-01 ENCOUNTER — Encounter: Payer: Self-pay | Admitting: Physical Therapy

## 2015-11-01 DIAGNOSIS — M1710 Unilateral primary osteoarthritis, unspecified knee: Secondary | ICD-10-CM

## 2015-11-01 DIAGNOSIS — R269 Unspecified abnormalities of gait and mobility: Secondary | ICD-10-CM

## 2015-11-01 DIAGNOSIS — R29898 Other symptoms and signs involving the musculoskeletal system: Secondary | ICD-10-CM

## 2015-11-01 DIAGNOSIS — R600 Localized edema: Secondary | ICD-10-CM

## 2015-11-01 DIAGNOSIS — M25661 Stiffness of right knee, not elsewhere classified: Secondary | ICD-10-CM

## 2015-11-01 NOTE — Therapy (Signed)
[facility] [address] [place], [place], [address] Phone: [phone]   Fax:  [phone]  Physical Therapy Treatment  Patient Details  Name: Destiny [name] MRN: [id number] Date of Birth: 01/02/1941 Referring Provider: Eden Lathe  Encounter Date: 11/01/2015      PT End of Session - 11/01/15 1154    Visit Number 5   Number of Visits 11   Date for PT Re-Evaluation 12/17/15   PT Start Time [id number]   PT Stop Time 1246   PT Time Calculation (min) 61 min   Activity Tolerance Patient tolerated treatment well   Behavior During Therapy [facility] for tasks assessed/performed      Past Medical History  Diagnosis Date  . Colon polyps   . Complication of anesthesia     BP drops with fentyl and versed  . Hypertension   . Hypothyroidism   . Anxiety   . Arthritis   . Hepatitis     hep A  1976    Past Surgical History  Procedure Laterality Date  . Thyroidectomy, partial      one lobe removed  . Abdominal hysterectomy    . Rotator cufff    . Appendectomy    . Cesarean section      times 2  . Foot surgery Bilateral   . Fracture surgery      broken ankle  . Medial partial knee replacement Left     done at [facility]  . Varicose vein surgery Right   . Back surgery       fusion  . Eye surgery Left     cataract removed  . Reverse total shoulder arthroplasty Right 12/28/2014    DR CHANDLER  . Reverse shoulder arthroplasty Right 12/28/2014    Procedure: REVERSE SHOULDER ARTHROPLASTY;  Surgeon: Nita Sells, MD;  Location: [place];  Service: Orthopedics;  Laterality: Right;  Right reverse total shoulder arthroplasty    There were no vitals filed for this visit.  Visit Diagnosis:  Knee stiffness, right  Leg edema, right  Abnormality of gait  Weakness of right leg  Primary osteoarthritis of one knee      Subjective Assessment - 11/01/15 1149    Subjective Pt was at [facility] yesterday, and had the  staples removed yesterday.     Currently in Pain? Yes   Pain Score 4    Pain Location Knee   Pain Orientation Right   Pain Descriptors / Indicators Tightness   Pain Type Surgical pain   Pain Onset 1 to 4 weeks ago   Pain Frequency Constant   Multiple Pain Sites No            [facility] PT Assessment - 11/01/15 0001    Assessment   Medical Diagnosis primary OA of the Rt knee (M17.11), s/p unicompartment knee replacment on the Rt   Referring Provider Poehling   Onset Date/Surgical Date 10/19/15  right partial knee   Next MD Visit 11/28/2015   Palpation   Palpation comment mid of patella 16.5, 5" below center of patella 15", 5" above mid of patella 20"                     [facility] Adult PT Treatment/Exercise - 11/01/15 0001    Exercises   Exercises Knee/Hip   Knee/Hip Exercises: Aerobic   Nustep Level 1x 10 minutes  Seat#6, arms #8   Knee/Hip Exercises: Machines for Strengthening   Cybex Leg Press 50#  with B LE 2x10  seat#5   Knee/Hip Exercises: Standing   Hip Abduction Both;Stengthening;2 sets;10 reps   Hip Extension Stengthening;Both;2 sets;10 reps   Forward Step Up Both;2 sets;10 reps;Hand Hold: 2;Step Height: 6"   Rebounder weight shifting 3 ways x 1 minute each   Other Standing Knee Exercises --   Modalities   Modalities Vasopneumatic   Vasopneumatic   Number Minutes Vasopneumatic  15 minutes   Vasopnuematic Location  Knee   Vasopneumatic Pressure Medium   Vasopneumatic Temperature  3 snowflakes                  PT Short Term Goals - 10/30/15 1024    PT SHORT TERM GOAL #1   Title be independent in HEP for after surgery   Status Achieved   PT SHORT TERM GOAL #2   Title be independent in initial HEP   Status Achieved   PT SHORT TERM GOAL #3   Title demonstrate Rt knee AROM flexio nto > 95 degrees to sit without substitution   PT SHORT TERM GOAL #4   Title reduce Rt knee pain to < or = to 4/10 with standing activity   Time 4   Period Weeks    Status On-going  up to 6/10    PT SHORT TERM GOAL #5   Title stand and walk for 10 minutes without rest to improve independence at home   Time 4   Period Weeks   Status On-going  havent tried this as she has not been in the community            PT Long Term Goals - 10/25/15 1134    PT LONG TERM GOAL #1   Title be independent in advanced HEP   Time 8   Period Weeks   Status On-going   PT LONG TERM GOAL #2   Title reduce FOTO to < or = to 45% limitaiton   Time 8   Period Weeks   Status On-going   PT LONG TERM GOAL #3   Title demonstrate Rt knee AROM flexion to > or = to 110 degrees to allow for descending steps and squatting with ease   Time 8   Period Weeks   Status On-going   PT LONG TERM GOAL #4   Title wean from walker to use of cane or no device for all distances   Time 8   Period Weeks   Status On-going   PT LONG TERM GOAL #5   Title stand and walk for 20 minutes without rest for independence in the home and community   Time 8   Period Weeks   Status On-going   PT LONG TERM GOAL #6   Title improve Rt LE strength to ascend curbs with the Rt LE   Time 8   Period Weeks   Status On-going   PT LONG TERM GOAL #7   Title report < or = to 3/10 Rt knee pain with standing and walking   Time 8   Period Weeks   Status On-going               Plan - 11/01/15 1155    Clinical Impression Statement Pt arrived without cane today, using it only for community walks, as grocery shopping. Pt had staples removed yesterday, and still has some swelling in med right knee, presents with quad weakness and limited ROM. Pt will continue to benefit from skilled PT for Rt knee strength, endurance and  gait training for retrun to prior level of function..    Pt will benefit from skilled therapeutic intervention in order to improve on the following deficits Abnormal gait;Decreased strength;Impaired flexibility;Increased edema;Pain;Decreased activity tolerance;Decreased range of  motion;Decreased scar mobility;Difficulty walking   Rehab Potential Good   PT Frequency 3x / week   PT Duration 8 weeks   PT Next Visit Plan progress strength and AAROM, gait training   Consulted and Agree with Plan of Care Patient        Problem List Patient Active Problem List   Diagnosis Date Noted  . S/p reverse total shoulder arthroplasty 12/28/2014  . Abnormality of gait 04/25/2014  . Foot pain 06/03/2011  . Metatarsalgia 06/03/2011  . CHRONIC PAIN SYNDROME 12/05/2009  . INSOMNIA 08/15/2009  . INGROWN TOENAIL 06/06/2009  . LEG PAIN, BILATERAL 06/05/2009  . OTHER VITAMIN B12 DEFICIENCY ANEMIA 04/04/2009  . MALAISE AND FATIGUE 02/28/2009  . UNSPECIFIED HYPOTHYROIDISM 07/26/2008  . HYPERLIPIDEMIA 11/05/2007  . NEUROPATHY, IDIOPATHIC PERIPHERAL NEC 09/16/2007  . HYPERTENSION 01/28/2007  . OSTEOARTHRITIS 01/28/2007  . ROTATOR CUFF INJURY, RIGHT SHOULDER 01/28/2007  . OSTEOPENIA 01/28/2007    NAUMANN-HOUEGNIFIO,Angelyse Heslin PTA 11/01/2015, 12:31 PM  [place] [address] [place], [place], [address] Phone: [phone]   Fax:  [phone]  Name: SHRAVYA JULSON MRN: [id number] Date of Birth: [birth year]

## 2015-11-06 ENCOUNTER — Ambulatory Visit: Payer: Medicare Other | Admitting: Physical Therapy

## 2015-11-06 ENCOUNTER — Encounter: Payer: Self-pay | Admitting: Physical Therapy

## 2015-11-06 DIAGNOSIS — R600 Localized edema: Secondary | ICD-10-CM

## 2015-11-06 DIAGNOSIS — M1710 Unilateral primary osteoarthritis, unspecified knee: Secondary | ICD-10-CM | POA: Diagnosis not present

## 2015-11-06 DIAGNOSIS — R269 Unspecified abnormalities of gait and mobility: Secondary | ICD-10-CM

## 2015-11-06 DIAGNOSIS — M25661 Stiffness of right knee, not elsewhere classified: Secondary | ICD-10-CM

## 2015-11-06 DIAGNOSIS — R29898 Other symptoms and signs involving the musculoskeletal system: Secondary | ICD-10-CM

## 2015-11-06 NOTE — Therapy (Signed)
[facility] [address], [place], [place], [address] Phone: [phone]   Fax:  [phone]  Physical Therapy Treatment  Patient Details  Name: Destiny [name] MRN: [id number] Date of Birth: 1941-01-20 Referring Provider: Eden Lathe  Encounter Date: 11/06/2015      PT End of Session - 11/06/15 1205    Visit Number 6   Number of Visits 10   Date for PT Re-Evaluation 12/17/15   PT Start Time [id number]   PT Stop Time 1247   PT Time Calculation (min) 62 min   Activity Tolerance Patient tolerated treatment well   Behavior During Therapy [facility] for tasks assessed/performed      Past Medical History  Diagnosis Date  . Colon polyps   . Complication of anesthesia     BP drops with fentyl and versed  . Hypertension   . Hypothyroidism   . Anxiety   . Arthritis   . Hepatitis     hep A  1976    Past Surgical History  Procedure Laterality Date  . Thyroidectomy, partial      one lobe removed  . Abdominal hysterectomy    . Rotator cufff    . Appendectomy    . Cesarean section      times 2  . Foot surgery Bilateral   . Fracture surgery      broken ankle  . Medial partial knee replacement Left     done at [facility]  . Varicose vein surgery Right   . Back surgery       fusion  . Eye surgery Left     cataract removed  . Reverse total shoulder arthroplasty Right 12/28/2014    DR CHANDLER  . Reverse shoulder arthroplasty Right 12/28/2014    Procedure: REVERSE SHOULDER ARTHROPLASTY;  Surgeon: Nita Sells, MD;  Location: [place];  Service: Orthopedics;  Laterality: Right;  Right reverse total shoulder arthroplasty    There were no vitals filed for this visit.  Visit Diagnosis:  Knee stiffness, right  Leg edema, right  Abnormality of gait  Weakness of right leg  Primary osteoarthritis of one knee      Subjective Assessment - 11/06/15 1157    Subjective Pt is tearful today, she had a bad weekend and the  pain was up to 8/10, today her pain is 6/10.    Currently in Pain? Yes  premedicated   Pain Score 5    Pain Location Knee   Pain Orientation Right   Pain Descriptors / Indicators Tightness   Pain Type Surgical pain   Pain Onset 1 to 4 weeks ago   Pain Frequency Constant   Multiple Pain Sites No                         [facility] Adult PT Treatment/Exercise - 11/06/15 0001    Exercises   Exercises Knee/Hip   Knee/Hip Exercises: Aerobic   Nustep Level 1x 10 minutes  Seat #7, Arms #8   Knee/Hip Exercises: Machines for Strengthening   Cybex Leg Press not today due to incr pain   Knee/Hip Exercises: Standing   Hip Abduction Both;Stengthening;2 sets;10 reps   Hip Extension Stengthening;Both;2 sets;10 reps   Forward Step Up Both;2 sets;10 reps;Hand Hold: 2;Step Height: 6"  pt able to tolerate   Rebounder weight shifting 3 ways incr time to x 2 minute each   Modalities   Modalities Vasopneumatic   Vasopneumatic   Number  Minutes Vasopneumatic  15 minutes   Vasopnuematic Location  Knee   Vasopneumatic Pressure Medium   Vasopneumatic Temperature  3 snowflakes   Manual Therapy   Manual Therapy Joint mobilization;Soft tissue mobilization   Manual therapy comments for knee flexion in sitting PROM and contract/relaxe 2 x 5   Soft tissue mobilization Soft tissue work to Rt quadriceps                  PT Short Term Goals - 11/06/15 1213    PT SHORT TERM GOAL #1   Title be independent in HEP for after surgery   Time 1   Period Weeks   Status Achieved   PT SHORT TERM GOAL #2   Title be independent in initial HEP   Time 4   Period Weeks   Status Achieved   PT SHORT TERM GOAL #3   Title demonstrate Rt knee AROM flexio nto > 95 degrees to sit without substitution   Time 4   Period Weeks   Status On-going   PT SHORT TERM GOAL #4   Title reduce Rt knee pain to < or = to 4/10 with standing activity   Time 4   Period Weeks   Status On-going   PT SHORT TERM  GOAL #5   Title stand and walk for 10 minutes without rest to improve independence at home   Time 4   Period Weeks   Status On-going           PT Long Term Goals - 11/06/15 1215    PT LONG TERM GOAL #1   Title be independent in advanced HEP   Time 8   Period Weeks   Status On-going   PT LONG TERM GOAL #2   Title reduce FOTO to < or = to 45% limitaiton   Time 8   Period Weeks   Status On-going   PT LONG TERM GOAL #3   Title demonstrate Rt knee AROM flexion to > or = to 110 degrees to allow for descending steps and squatting with ease   Time 8   Period Weeks   Status On-going   PT LONG TERM GOAL #4   Title wean from walker to use of cane or no device for all distances   Time 8   Period Weeks   Status On-going   PT LONG TERM GOAL #5   Title stand and walk for 20 minutes without rest for independence in the home and community   Time 8   Period Weeks   Status On-going   PT LONG TERM GOAL #6   Title improve Rt LE strength to ascend curbs with the Rt LE   Time 8   Period Weeks   Status On-going   PT LONG TERM GOAL #7   Title report < or = to 3/10 Rt knee pain with standing and walking   Time 8   Period Weeks   Status On-going               Plan - 11/06/15 1206    Clinical Impression Statement Pt arrived with cane today, due to incr pain since this weekend. The scar on the knee is healing well and only slighly incr of temperature. Pt will continue to benefit from skilled PT to assist with swelling, help to control pain and incr strength.     Pt will benefit from skilled therapeutic intervention in order to improve on the following deficits Abnormal gait;Decreased strength;Impaired flexibility;Increased  edema;Pain;Decreased activity tolerance;Decreased range of motion;Decreased scar mobility;Difficulty walking   Rehab Potential Good   PT Frequency 3x / week   PT Duration 8 weeks   PT Treatment/Interventions ADLs/Self Care Home Management;Cryotherapy;Electrical  Stimulation;Therapeutic exercise;Therapeutic activities;Functional mobility training;Manual techniques;Vasopneumatic Device;Passive range of motion;Scar mobilization;Gait training;Stair training;Patient/family education;Neuromuscular re-education;Ultrasound;Moist Heat   PT Next Visit Plan progress strength and AAROM, gait training   Consulted and Agree with Plan of Care Patient        Problem List Patient Active Problem List   Diagnosis Date Noted  . S/p reverse total shoulder arthroplasty 12/28/2014  . Abnormality of gait 04/25/2014  . Foot pain 06/03/2011  . Metatarsalgia 06/03/2011  . CHRONIC PAIN SYNDROME 12/05/2009  . INSOMNIA 08/15/2009  . INGROWN TOENAIL 06/06/2009  . LEG PAIN, BILATERAL 06/05/2009  . OTHER VITAMIN B12 DEFICIENCY ANEMIA 04/04/2009  . MALAISE AND FATIGUE 02/28/2009  . UNSPECIFIED HYPOTHYROIDISM 07/26/2008  . HYPERLIPIDEMIA 11/05/2007  . NEUROPATHY, IDIOPATHIC PERIPHERAL NEC 09/16/2007  . HYPERTENSION 01/28/2007  . OSTEOARTHRITIS 01/28/2007  . ROTATOR CUFF INJURY, RIGHT SHOULDER 01/28/2007  . OSTEOPENIA 01/28/2007    NAUMANN-HOUEGNIFIO,Sayyid Harewood PTA 11/06/2015, 12:30 PM  [place] [facility] [address], [place], [place], [address] Phone: [phone]   Fax:  [phone]  Name: Destiny [name] MRN: [id number] Date of Birth: 03-26-1941

## 2015-11-08 ENCOUNTER — Ambulatory Visit: Payer: Medicare Other | Admitting: Physical Therapy

## 2015-11-08 ENCOUNTER — Encounter: Payer: Self-pay | Admitting: Physical Therapy

## 2015-11-08 DIAGNOSIS — R600 Localized edema: Secondary | ICD-10-CM

## 2015-11-08 DIAGNOSIS — R269 Unspecified abnormalities of gait and mobility: Secondary | ICD-10-CM

## 2015-11-08 DIAGNOSIS — M1710 Unilateral primary osteoarthritis, unspecified knee: Secondary | ICD-10-CM | POA: Diagnosis not present

## 2015-11-08 DIAGNOSIS — M25661 Stiffness of right knee, not elsewhere classified: Secondary | ICD-10-CM

## 2015-11-08 DIAGNOSIS — R29898 Other symptoms and signs involving the musculoskeletal system: Secondary | ICD-10-CM

## 2015-11-08 NOTE — Therapy (Signed)
[facility] [address], [place], [place], [address] Phone: [phone]   Fax:  [phone]  Physical Therapy Treatment  Patient Details  Name: Destiny [name] MRN: [id number] Date of Birth: 01-Sep-1941 Referring Provider: Eden Lathe  Encounter Date: 11/08/2015      PT End of Session - 11/08/15 1154    Visit Number 7   Number of Visits 10   Date for PT Re-Evaluation 12/17/15   PT Start Time [id number]   PT Stop Time 1247   PT Time Calculation (min) 62 min   Activity Tolerance Patient tolerated treatment well   Behavior During Therapy [facility] for tasks assessed/performed      Past Medical History  Diagnosis Date  . Colon polyps   . Complication of anesthesia     BP drops with fentyl and versed  . Hypertension   . Hypothyroidism   . Anxiety   . Arthritis   . Hepatitis     hep A  1976    Past Surgical History  Procedure Laterality Date  . Thyroidectomy, partial      one lobe removed  . Abdominal hysterectomy    . Rotator cufff    . Appendectomy    . Cesarean section      times 2  . Foot surgery Bilateral   . Fracture surgery      broken ankle  . Medial partial knee replacement Left     done at [facility]  . Varicose vein surgery Right   . Back surgery       fusion  . Eye surgery Left     cataract removed  . Reverse total shoulder arthroplasty Right 12/28/2014    DR CHANDLER  . Reverse shoulder arthroplasty Right 12/28/2014    Procedure: REVERSE SHOULDER ARTHROPLASTY;  Surgeon: Nita Sells, MD;  Location: [place];  Service: Orthopedics;  Laterality: Right;  Right reverse total shoulder arthroplasty    There were no vitals filed for this visit.  Visit Diagnosis:  Knee stiffness, right  Leg edema, right  Abnormality of gait  Weakness of right leg      Subjective Assessment - 11/08/15 1152    Subjective Pt feels better today, she rates her pain as 5/10 in right knee, premedicated. Pt reports  the swelling is going down   Pertinent History Rt reverse total shoulder 12/2014,Lt medial compartment total knee replacement 11/2012   Limitations Standing;Walking   How long can you sit comfortably? 30 minutes   How long can you stand comfortably? < 5 minutes   How long can you walk comfortably? 5 minutes max   Diagnostic tests MRI- OA of Rt knee   Currently in Pain? Yes   Pain Score 5    Pain Location Knee   Pain Orientation Right   Pain Descriptors / Indicators Tightness   Pain Type Surgical pain   Pain Onset 1 to 4 weeks ago   Pain Frequency Constant   Aggravating Factors  standing, walking, bending the knee   Pain Relieving Factors rest, elevation, meds, ice   Multiple Pain Sites No                         [facility] Adult PT Treatment/Exercise - 11/08/15 0001    Exercises   Exercises Knee/Hip   Knee/Hip Exercises: Aerobic   Nustep Level 1x 10 minutes  Seat# 6, Arms #10   Knee/Hip Exercises: Machines for Strengthening   Cybex Leg  Press 55# 3 x 10 with B LE,   seat #5   Knee/Hip Exercises: Standing   Hip Abduction Both;Stengthening;2 sets;10 reps   Hip Extension Stengthening;Both;2 sets;10 reps   Forward Step Up Both;2 sets;10 reps;Hand Hold: 2;Step Height: 6"   Rebounder weight shifting 3 ways incr time to x 2 minute each   Gait Training Ambulation in gym 4 rounds in 72min30sec, with sudden stops and go  challenging to maintain balance with sudden stops.   Modalities   Modalities Vasopneumatic   Vasopneumatic   Number Minutes Vasopneumatic  15 minutes   Vasopnuematic Location  Knee   Vasopneumatic Pressure Medium   Vasopneumatic Temperature  3 snowflakes                  PT Short Term Goals - 11/06/15 1213    PT SHORT TERM GOAL #1   Title be independent in HEP for after surgery   Time 1   Period Weeks   Status Achieved   PT SHORT TERM GOAL #2   Title be independent in initial HEP   Time 4   Period Weeks   Status Achieved   PT SHORT  TERM GOAL #3   Title demonstrate Rt knee AROM flexio nto > 95 degrees to sit without substitution   Time 4   Period Weeks   Status On-going   PT SHORT TERM GOAL #4   Title reduce Rt knee pain to < or = to 4/10 with standing activity   Time 4   Period Weeks   Status On-going   PT SHORT TERM GOAL #5   Title stand and walk for 10 minutes without rest to improve independence at home   Time 4   Period Weeks   Status On-going           PT Long Term Goals - 11/06/15 1215    PT LONG TERM GOAL #1   Title be independent in advanced HEP   Time 8   Period Weeks   Status On-going   PT LONG TERM GOAL #2   Title reduce FOTO to < or = to 45% limitaiton   Time 8   Period Weeks   Status On-going   PT LONG TERM GOAL #3   Title demonstrate Rt knee AROM flexion to > or = to 110 degrees to allow for descending steps and squatting with ease   Time 8   Period Weeks   Status On-going   PT LONG TERM GOAL #4   Title wean from walker to use of cane or no device for all distances   Time 8   Period Weeks   Status On-going   PT LONG TERM GOAL #5   Title stand and walk for 20 minutes without rest for independence in the home and community   Time 8   Period Weeks   Status On-going   PT LONG TERM GOAL #6   Title improve Rt LE strength to ascend curbs with the Rt LE   Time 8   Period Weeks   Status On-going   PT LONG TERM GOAL #7   Title report < or = to 3/10 Rt knee pain with standing and walking   Time 8   Period Weeks   Status On-going               Plan - 11/08/15 1156    Clinical Impression Statement Pt arrived with cane today, but with improved pace with walking. Pt tolerated  treatment well and will continue to benefit from skilled PT to assist with edema control, and pain and increase pain.    Pt will benefit from skilled therapeutic intervention in order to improve on the following deficits Abnormal gait;Decreased strength;Impaired flexibility;Increased edema;Pain;Decreased  activity tolerance;Decreased range of motion;Decreased scar mobility;Difficulty walking   Rehab Potential Good   PT Frequency 3x / week   PT Duration 8 weeks   PT Treatment/Interventions ADLs/Self Care Home Management;Cryotherapy;Electrical Stimulation;Therapeutic exercise;Therapeutic activities;Functional mobility training;Manual techniques;Vasopneumatic Device;Passive range of motion;Scar mobilization;Gait training;Stair training;Patient/family education;Neuromuscular re-education;Ultrasound;Moist Heat   PT Next Visit Plan progress strength and AAROM, gait training   Consulted and Agree with Plan of Care Patient        Problem List Patient Active Problem List   Diagnosis Date Noted  . S/p reverse total shoulder arthroplasty 12/28/2014  . Abnormality of gait 04/25/2014  . Foot pain 06/03/2011  . Metatarsalgia 06/03/2011  . CHRONIC PAIN SYNDROME 12/05/2009  . INSOMNIA 08/15/2009  . INGROWN TOENAIL 06/06/2009  . LEG PAIN, BILATERAL 06/05/2009  . OTHER VITAMIN B12 DEFICIENCY ANEMIA 04/04/2009  . MALAISE AND FATIGUE 02/28/2009  . UNSPECIFIED HYPOTHYROIDISM 07/26/2008  . HYPERLIPIDEMIA 11/05/2007  . NEUROPATHY, IDIOPATHIC PERIPHERAL NEC 09/16/2007  . HYPERTENSION 01/28/2007  . OSTEOARTHRITIS 01/28/2007  . ROTATOR CUFF INJURY, RIGHT SHOULDER 01/28/2007  . OSTEOPENIA 01/28/2007    NAUMANN-HOUEGNIFIO,Kerline Trahan PTA 11/08/2015, 1:25 PM  [facility] [address], [place], [place], [address] Phone: [phone]   Fax:  [phone]  Name: Destiny [name] MRN: [id number] Date of Birth: Mar 22, 1941

## 2015-11-12 ENCOUNTER — Encounter: Payer: Self-pay | Admitting: Physical Therapy

## 2015-11-12 ENCOUNTER — Ambulatory Visit: Payer: Medicare Other | Admitting: Physical Therapy

## 2015-11-12 DIAGNOSIS — R269 Unspecified abnormalities of gait and mobility: Secondary | ICD-10-CM

## 2015-11-12 DIAGNOSIS — M25661 Stiffness of right knee, not elsewhere classified: Secondary | ICD-10-CM

## 2015-11-12 DIAGNOSIS — M1710 Unilateral primary osteoarthritis, unspecified knee: Secondary | ICD-10-CM

## 2015-11-12 DIAGNOSIS — R29898 Other symptoms and signs involving the musculoskeletal system: Secondary | ICD-10-CM

## 2015-11-12 DIAGNOSIS — R600 Localized edema: Secondary | ICD-10-CM

## 2015-11-12 NOTE — Therapy (Signed)
[facility] [address], [place], [place], [address] Phone: [phone]   Fax:  [phone]  Physical Therapy Treatment  Patient Details  Name: Destiny [name] MRN: [id number] Date of Birth: [birth year] Referring Provider: Eden Lathe  Encounter Date: 11/12/2015      PT End of Session - 11/12/15 1200    Visit Number 8   Number of Visits 10   Date for PT Re-Evaluation 12/17/15   PT Start Time [id number]   PT Stop Time 1246   PT Time Calculation (min) 64 min   Activity Tolerance Patient tolerated treatment well   Behavior During Therapy [facility] for tasks assessed/performed      Past Medical History  Diagnosis Date  . Colon polyps   . Complication of anesthesia     BP drops with fentyl and versed  . Hypertension   . Hypothyroidism   . Anxiety   . Arthritis   . Hepatitis     hep A  1976    Past Surgical History  Procedure Laterality Date  . Thyroidectomy, partial      one lobe removed  . Abdominal hysterectomy    . Rotator cufff    . Appendectomy    . Cesarean section      times 2  . Foot surgery Bilateral   . Fracture surgery      broken ankle  . Medial partial knee replacement Left     done at [facility]  . Varicose vein surgery Right   . Back surgery       fusion  . Eye surgery Left     cataract removed  . Reverse total shoulder arthroplasty Right 12/28/2014    DR CHANDLER  . Reverse shoulder arthroplasty Right 12/28/2014    Procedure: REVERSE SHOULDER ARTHROPLASTY;  Surgeon: Nita Sells, MD;  Location: [facility];  Service: Orthopedics;  Laterality: Right;  Right reverse total shoulder arthroplasty    There were no vitals filed for this visit.  Visit Diagnosis:  Knee stiffness, right  Leg edema, right  Abnormality of gait  Weakness of right leg  Primary osteoarthritis of one knee      Subjective Assessment - 11/12/15 1156    Subjective Pt was at a family x-mas party yesterday, and was  able to tolerate it well. Pt reports no pain with resting but with activities pain up to 3/10.    Pertinent History Rt reverse total shoulder 12/2014,Lt medial compartment total knee replacement 11/2012   Currently in Pain? Yes   Pain Score 3    Pain Location Knee   Pain Orientation Right   Pain Descriptors / Indicators Sore;Tightness   Pain Type Surgical pain   Pain Onset More than a month ago   Pain Frequency Constant   Aggravating Factors  standing, walking, bending the knee   Pain Relieving Factors rest, elevation, meds, ice   Multiple Pain Sites No                         [facility] Adult PT Treatment/Exercise - 11/12/15 0001    Ambulation/Gait   Stairs Yes   Stairs Assistance 6: Modified independent (Device/Increase time)   Stair Management Technique Two rails;Alternating pattern   Number of Stairs 12   Height of Stairs 6   Exercises   Exercises Knee/Hip   Knee/Hip Exercises: Aerobic   Nustep Level 1x 10 minutes  Seat # 6, arms # 10   Knee/Hip Exercises:  Machines for Strengthening   Cybex Leg Press 55# 3 x 10 with B LE,   seat #5   Knee/Hip Exercises: Standing   Hip Abduction Both;Stengthening;2 sets;10 reps   Hip Extension Stengthening;Both;2 sets;10 reps   Lateral Step Up Other (comment)  attempted, but pt to fearful to perform   Forward Step Up Both;2 sets;10 reps;Hand Hold: 2;Step Height: 6"   Rebounder weight shifting 3 ways incr time to x 2 minute each   Gait Training Ambulation in gym 4 rounds in 33min 46sec, with sudden stops and go   Modalities   Modalities Vasopneumatic   Vasopneumatic   Number Minutes Vasopneumatic  15 minutes   Vasopnuematic Location  Knee   Vasopneumatic Pressure High   Vasopneumatic Temperature  3 snowflakes   Manual Therapy   Manual Therapy Joint mobilization;Soft tissue mobilization   Manual therapy comments in supine   Soft tissue mobilization Soft tissue work to Rt quadriceps                  PT Short Term  Goals - 11/12/15 1206    PT SHORT TERM GOAL #1   Title be independent in HEP for after surgery   Time 1   Period Weeks   Status Achieved   PT SHORT TERM GOAL #2   Title be independent in initial HEP   Time 4   Period Weeks   Status Achieved   PT SHORT TERM GOAL #3   Title demonstrate Rt knee AROM flexio nto > 95 degrees to sit without substitution   Time 4   Period Weeks   Status On-going   PT SHORT TERM GOAL #4   Title reduce Rt knee pain to < or = to 4/10 with standing activity   Time 4   Period Weeks   Status On-going   PT SHORT TERM GOAL #5   Title stand and walk for 10 minutes without rest to improve independence at home   Time 4   Period Weeks   Status On-going           PT Long Term Goals - 11/12/15 1212    PT LONG TERM GOAL #1   Title be independent in advanced HEP   Time 8   Period Weeks   Status On-going   PT LONG TERM GOAL #2   Title reduce FOTO to < or = to 45% limitaiton   Time 8   Period Weeks   Status On-going   PT LONG TERM GOAL #3   Title demonstrate Rt knee AROM flexion to > or = to 110 degrees to allow for descending steps and squatting with ease   Time 8   Period Weeks   Status On-going   PT LONG TERM GOAL #4   Title wean from walker to use of cane or no device for all distances   Time 8   Period Weeks   Status On-going   PT LONG TERM GOAL #5   Title stand and walk for 20 minutes without rest for independence in the home and community   Time 8   Period Weeks   Status On-going   PT LONG TERM GOAL #6   Title improve Rt LE strength to ascend curbs with the Rt LE   Time 8   Period Weeks   Status On-going   PT LONG TERM GOAL #7   Title report < or = to 3/10 Rt knee pain with standing and walking   Time 8  Period Weeks   Status On-going               Plan - 11/12/15 1201    Clinical Impression Statement Patient arrived without cane today. Pt with improved activity tolerance in physical therapy. Pt will continue to benefit  from skilled PT to assist with edema control, and pain and increase strength.     Pt will benefit from skilled therapeutic intervention in order to improve on the following deficits Abnormal gait;Decreased strength;Impaired flexibility;Increased edema;Pain;Decreased activity tolerance;Decreased range of motion;Decreased scar mobility;Difficulty walking   Rehab Potential Good   PT Frequency 3x / week   PT Duration 8 weeks   PT Treatment/Interventions ADLs/Self Care Home Management;Cryotherapy;Electrical Stimulation;Therapeutic exercise;Therapeutic activities;Functional mobility training;Manual techniques;Vasopneumatic Device;Passive range of motion;Scar mobilization;Gait training;Stair training;Patient/family education;Neuromuscular re-education;Ultrasound;Moist Heat   PT Next Visit Plan progress strength and AAROM, gait training   Consulted and Agree with Plan of Care Patient        Problem List Patient Active Problem List   Diagnosis Date Noted  . S/p reverse total shoulder arthroplasty 12/28/2014  . Abnormality of gait 04/25/2014  . Foot pain 06/03/2011  . Metatarsalgia 06/03/2011  . CHRONIC PAIN SYNDROME 12/05/2009  . INSOMNIA 08/15/2009  . INGROWN TOENAIL 06/06/2009  . LEG PAIN, BILATERAL 06/05/2009  . OTHER VITAMIN B12 DEFICIENCY ANEMIA 04/04/2009  . MALAISE AND FATIGUE 02/28/2009  . UNSPECIFIED HYPOTHYROIDISM 07/26/2008  . HYPERLIPIDEMIA 11/05/2007  . NEUROPATHY, IDIOPATHIC PERIPHERAL NEC 09/16/2007  . HYPERTENSION 01/28/2007  . OSTEOARTHRITIS 01/28/2007  . ROTATOR CUFF INJURY, RIGHT SHOULDER 01/28/2007  . OSTEOPENIA 01/28/2007    NAUMANN-HOUEGNIFIO,Errin Chewning PTA 11/12/2015, 12:48 PM  [facility] [address] [place], [place], [address] Phone: [phone]   Fax:  [phone]  Name: TEMPIE DELAPUENTE MRN: [id number] Date of Birth: [birth year]

## 2015-11-15 ENCOUNTER — Ambulatory Visit: Payer: Medicare Other | Admitting: Physical Therapy

## 2015-11-15 ENCOUNTER — Encounter: Payer: Self-pay | Admitting: Physical Therapy

## 2015-11-15 DIAGNOSIS — R600 Localized edema: Secondary | ICD-10-CM

## 2015-11-15 DIAGNOSIS — R269 Unspecified abnormalities of gait and mobility: Secondary | ICD-10-CM

## 2015-11-15 DIAGNOSIS — M1710 Unilateral primary osteoarthritis, unspecified knee: Secondary | ICD-10-CM

## 2015-11-15 DIAGNOSIS — M25661 Stiffness of right knee, not elsewhere classified: Secondary | ICD-10-CM

## 2015-11-15 DIAGNOSIS — R29898 Other symptoms and signs involving the musculoskeletal system: Secondary | ICD-10-CM

## 2015-11-15 NOTE — Therapy (Signed)
[facility] [address], [place], [place], [address] Phone: [phone]   Fax:  [phone]  Physical Therapy Treatment  Patient Details  Name: Destiny [name] MRN: [id number] Date of Birth: 13-Jan-1941 Referring Provider: Eden Lathe  Encounter Date: 11/15/2015      PT End of Session - 11/15/15 1232    Visit Number 9   Number of Visits 10   Date for PT Re-Evaluation 12/17/15   PT Start Time 1143   PT Stop Time 1246   PT Time Calculation (min) 63 min   Activity Tolerance Patient tolerated treatment well   Behavior During Therapy [facility] for tasks assessed/performed      Past Medical History  Diagnosis Date  . Colon polyps   . Complication of anesthesia     BP drops with fentyl and versed  . Hypertension   . Hypothyroidism   . Anxiety   . Arthritis   . Hepatitis     hep A  1976    Past Surgical History  Procedure Laterality Date  . Thyroidectomy, partial      one lobe removed  . Abdominal hysterectomy    . Rotator cufff    . Appendectomy    . Cesarean section      times 2  . Foot surgery Bilateral   . Fracture surgery      broken ankle  . Medial partial knee replacement Left     done at [facility]  . Varicose vein surgery Right   . Back surgery       fusion  . Eye surgery Left     cataract removed  . Reverse total shoulder arthroplasty Right 12/28/2014    DR CHANDLER  . Reverse shoulder arthroplasty Right 12/28/2014    Procedure: REVERSE SHOULDER ARTHROPLASTY;  Surgeon: Nita Sells, MD;  Location: [place];  Service: Orthopedics;  Laterality: Right;  Right reverse total shoulder arthroplasty    There were no vitals filed for this visit.  Visit Diagnosis:  Knee stiffness, right  Leg edema, right  Abnormality of gait  Weakness of right leg  Primary osteoarthritis of one knee      Subjective Assessment - 11/15/15 1153    Subjective Pt reports was at the grocery store walking, she  rates her pain as 5-6/10 today, practiced a lot the bending with Rt knee and was very active yesterday.   Pertinent History Rt reverse total shoulder 12/2014,Lt medial compartment total knee replacement 11/2012   Limitations Standing;Walking   How long can you sit comfortably? 30 minutes   How long can you stand comfortably? < 5 minutes   How long can you walk comfortably? 5 minutes max   Diagnostic tests MRI- OA of Rt knee   Currently in Pain? Yes   Pain Score 5    Pain Location Knee   Pain Orientation Right   Pain Descriptors / Indicators Sore;Tightness   Pain Type Surgical pain   Pain Onset More than a month ago   Pain Frequency Constant   Aggravating Factors  standing, walking, bending the knee   Pain Relieving Factors rest, elevation, meds, ice   Multiple Pain Sites No                         [facility] Adult PT Treatment/Exercise - 11/15/15 0001    Ambulation/Gait   Stairs Yes   Stairs Assistance 6: Modified independent (Device/Increase time)   Stair Management Technique Two  rails;Alternating pattern   Number of Stairs 12   Height of Stairs 6   Exercises   Exercises Knee/Hip   Knee/Hip Exercises: Stretches   Sports administrator Right;5 reps;20 seconds  on stairs   Knee/Hip Exercises: Aerobic   Nustep Level 1x 10 minutes  seat #6, arms #10   Knee/Hip Exercises: Machines for Strengthening   Cybex Leg Press 55# 3 x 10 with B LE, 25# 1x10 Rt LE,   seat#5   Knee/Hip Exercises: Standing   Hip Abduction Both;Stengthening;2 sets;10 reps   Hip Extension Stengthening;Both;2 sets;10 reps   Forward Step Up --   Step Down Other (comment);Left;Hand Hold: 2;Step Height: 6"  tapping down with left    Rebounder weight shifting 3 ways incr time to x 2 minute each   Gait Training Ambulation in gym 4 rounds in 73min 46sec, with sudden stops and go   Other Standing Knee Exercises mini squats 2x10   Modalities   Modalities Vasopneumatic   Vasopneumatic   Number Minutes  Vasopneumatic  15 minutes   Vasopnuematic Location  Knee   Vasopneumatic Pressure High   Vasopneumatic Temperature  3 snowflakes   Manual Therapy   Manual Therapy Joint mobilization;Soft tissue mobilization  PROM into flexion in sitting   Manual therapy comments in sitting   Soft tissue mobilization Soft tissue work to Rt quadriceps                  PT Short Term Goals - 11/12/15 1206    PT SHORT TERM GOAL #1   Title be independent in HEP for after surgery   Time 1   Period Weeks   Status Achieved   PT SHORT TERM GOAL #2   Title be independent in initial HEP   Time 4   Period Weeks   Status Achieved   PT SHORT TERM GOAL #3   Title demonstrate Rt knee AROM flexio nto > 95 degrees to sit without substitution   Time 4   Period Weeks   Status On-going   PT SHORT TERM GOAL #4   Title reduce Rt knee pain to < or = to 4/10 with standing activity   Time 4   Period Weeks   Status On-going   PT SHORT TERM GOAL #5   Title stand and walk for 10 minutes without rest to improve independence at home   Time 4   Period Weeks   Status On-going           PT Long Term Goals - 11/12/15 1212    PT LONG TERM GOAL #1   Title be independent in advanced HEP   Time 8   Period Weeks   Status On-going   PT LONG TERM GOAL #2   Title reduce FOTO to < or = to 45% limitaiton   Time 8   Period Weeks   Status On-going   PT LONG TERM GOAL #3   Title demonstrate Rt knee AROM flexion to > or = to 110 degrees to allow for descending steps and squatting with ease   Time 8   Period Weeks   Status On-going   PT LONG TERM GOAL #4   Title wean from walker to use of cane or no device for all distances   Time 8   Period Weeks   Status On-going   PT LONG TERM GOAL #5   Title stand and walk for 20 minutes without rest for independence in the home and community  Time 8   Period Weeks   Status On-going   PT LONG TERM GOAL #6   Title improve Rt LE strength to ascend curbs with the Rt  LE   Time 8   Period Weeks   Status On-going   PT LONG TERM GOAL #7   Title report < or = to 3/10 Rt knee pain with standing and walking   Time 8   Period Weeks   Status On-going               Plan - 11/15/15 1233    Clinical Impression Statement Pt with improved activity tolerance and improved confidence with activities on stairs. Pt wilth swelling in Rt knee and she will continue to benefit from skilled PT for edema control, and pain and to increase strength   Pt will benefit from skilled therapeutic intervention in order to improve on the following deficits Abnormal gait;Decreased strength;Impaired flexibility;Increased edema;Pain;Decreased activity tolerance;Decreased range of motion;Decreased scar mobility;Difficulty walking   Rehab Potential Good   PT Frequency 3x / week   PT Duration 8 weeks   PT Treatment/Interventions ADLs/Self Care Home Management;Cryotherapy;Electrical Stimulation;Therapeutic exercise;Therapeutic activities;Functional mobility training;Manual techniques;Vasopneumatic Device;Passive range of motion;Scar mobilization;Gait training;Stair training;Patient/family education;Neuromuscular re-education;Ultrasound;Moist Heat   PT Next Visit Plan progress strength and AAROM, gait training   Consulted and Agree with Plan of Care Patient        Problem List Patient Active Problem List   Diagnosis Date Noted  . S/p reverse total shoulder arthroplasty 12/28/2014  . Abnormality of gait 04/25/2014  . Foot pain 06/03/2011  . Metatarsalgia 06/03/2011  . CHRONIC PAIN SYNDROME 12/05/2009  . INSOMNIA 08/15/2009  . INGROWN TOENAIL 06/06/2009  . LEG PAIN, BILATERAL 06/05/2009  . OTHER VITAMIN B12 DEFICIENCY ANEMIA 04/04/2009  . MALAISE AND FATIGUE 02/28/2009  . UNSPECIFIED HYPOTHYROIDISM 07/26/2008  . HYPERLIPIDEMIA 11/05/2007  . NEUROPATHY, IDIOPATHIC PERIPHERAL NEC 09/16/2007  . HYPERTENSION 01/28/2007  . OSTEOARTHRITIS 01/28/2007  . ROTATOR CUFF INJURY,  RIGHT SHOULDER 01/28/2007  . OSTEOPENIA 01/28/2007    NAUMANN-HOUEGNIFIO,Annamay Laymon PTA 11/15/2015, 12:35 PM  [facility] [address], [place], [place], [address] Phone: [phone]   Fax:  [phone]  Name: GIANELLY SIME MRN: [id number] Date of Birth: 12/16/1940

## 2015-11-20 ENCOUNTER — Encounter: Payer: Self-pay | Admitting: Physical Therapy

## 2015-11-20 ENCOUNTER — Ambulatory Visit: Payer: Medicare Other | Admitting: Physical Therapy

## 2015-11-20 DIAGNOSIS — M25661 Stiffness of right knee, not elsewhere classified: Secondary | ICD-10-CM

## 2015-11-20 DIAGNOSIS — M1710 Unilateral primary osteoarthritis, unspecified knee: Secondary | ICD-10-CM | POA: Diagnosis not present

## 2015-11-20 DIAGNOSIS — R269 Unspecified abnormalities of gait and mobility: Secondary | ICD-10-CM

## 2015-11-20 DIAGNOSIS — R600 Localized edema: Secondary | ICD-10-CM

## 2015-11-20 DIAGNOSIS — R29898 Other symptoms and signs involving the musculoskeletal system: Secondary | ICD-10-CM

## 2015-11-20 NOTE — Therapy (Signed)
[facility] [address] [place], [place], [address] Phone: [phone]   Fax:  [phone]  Physical Therapy Treatment  Patient Details  Name: Destiny [name] MRN: [id number] Date of Birth: Sep 25, 1941 Referring Provider: Eden Lathe  Encounter Date: 11/20/2015      PT End of Session - 11/20/15 1206    Visit Number 10   Number of Visits 10   Date for PT Re-Evaluation 12/17/15   PT Start Time 1143   PT Stop Time 1247   PT Time Calculation (min) 64 min   Activity Tolerance Patient tolerated treatment well   Behavior During Therapy [facility] for tasks assessed/performed      Past Medical History  Diagnosis Date  . Colon polyps   . Complication of anesthesia     BP drops with fentyl and versed  . Hypertension   . Hypothyroidism   . Anxiety   . Arthritis   . Hepatitis     hep A  1976    Past Surgical History  Procedure Laterality Date  . Thyroidectomy, partial      one lobe removed  . Abdominal hysterectomy    . Rotator cufff    . Appendectomy    . Cesarean section      times 2  . Foot surgery Bilateral   . Fracture surgery      broken ankle  . Medial partial knee replacement Left     done at [facility]  . Varicose vein surgery Right   . Back surgery       fusion  . Eye surgery Left     cataract removed  . Reverse total shoulder arthroplasty Right 12/28/2014    DR CHANDLER  . Reverse shoulder arthroplasty Right 12/28/2014    Procedure: REVERSE SHOULDER ARTHROPLASTY;  Surgeon: Nita Sells, MD;  Location: [facility];  Service: Orthopedics;  Laterality: Right;  Right reverse total shoulder arthroplasty    There were no vitals filed for this visit.  Visit Diagnosis:  Knee stiffness, right  Leg edema, right  Abnormality of gait  Weakness of right leg  Primary osteoarthritis of one knee      Subjective Assessment - 11/20/15 1158    Subjective Pt reports she is hoping to be able to drive soon,  she has MD appointment on January 4th. Pt rates her pain in right knee as 4/10. Pt was able to negotiate the stairs at her daughters house, but with idfficulties getting up from sofa.    Pertinent History Rt reverse total shoulder 12/2014,Lt medial compartment total knee replacement 11/2012   Limitations Standing;Walking   How long can you sit comfortably? 30 minutes   How long can you stand comfortably? < 5 minutes   How long can you walk comfortably? 5 minutes max   Diagnostic tests MRI- OA of Rt knee   Currently in Pain? Yes   Pain Score 4    Pain Location Knee   Pain Orientation Right   Pain Descriptors / Indicators Sore;Tightness   Pain Type Surgical pain   Pain Onset More than a month ago   Pain Frequency Constant   Aggravating Factors  standing, walking, prolonged sitting makes it feelstiff, bending the knee   Pain Relieving Factors rest, elevation, meds, ice   Multiple Pain Sites No            [facility] PT Assessment - 11/20/15 0001    Observation/Other Assessments   Focus on Therapeutic Outcomes (FOTO)  56%  [facility] Adult PT Treatment/Exercise - 11/20/15 0001    Ambulation/Gait   Stairs Yes   Stairs Assistance 6: Modified independent (Device/Increase time)   Stair Management Technique Two rails;Alternating pattern   Number of Stairs 16   Height of Stairs 6   Exercises   Exercises Knee/Hip   Knee/Hip Exercises: Stretches   Quad Stretch Right;5 reps;20 seconds  on stairs   Knee/Hip Exercises: Aerobic   Nustep Level 2x 10 minutes  .30mpH   Knee/Hip Exercises: Machines for Strengthening   Cybex Leg Press 60# 1 x 10, 65# 2x10 with B LE,     Knee/Hip Exercises: Standing   Lateral Step Up Right;2 sets;10 reps;Hand Hold: 2;Step Height: 6"   Forward Step Up Both;2 sets;10 reps;Hand Hold: 2;Step Height: 6"   Step Down Other (comment);Left;Hand Hold: 2;Step Height: 6"  tapping down with left   Rocker Board 2 minutes  with B UE support    Modalities   Modalities Vasopneumatic   Vasopneumatic   Number Minutes Vasopneumatic  15 minutes   Vasopnuematic Location  Knee   Vasopneumatic Pressure Medium   Vasopneumatic Temperature  3 snowflakes                  PT Short Term Goals - 11/20/15 1213    PT SHORT TERM GOAL #1   Title be independent in HEP for after surgery   Time 1   Period Weeks   Status Achieved   PT SHORT TERM GOAL #2   Title be independent in initial HEP   Time 4   Period Weeks   Status Achieved   PT SHORT TERM GOAL #3   Title demonstrate Rt knee AROM flexio nto > 95 degrees to sit without substitution   Time 4   Period Weeks   Status On-going   PT SHORT TERM GOAL #4   Title reduce Rt knee pain to < or = to 4/10 with standing activity   Time 4   Period Weeks   Status On-going   PT SHORT TERM GOAL #5   Title stand and walk for 10 minutes without rest to improve independence at home   Time 4   Period Weeks   Status On-going           PT Long Term Goals - 11/20/15 1214    PT LONG TERM GOAL #1   Title be independent in advanced HEP   Time 8   Period Weeks   Status On-going   PT LONG TERM GOAL #2   Title reduce FOTO to < or = to 45% limitaiton   Time 8   Period Weeks   Status On-going   PT LONG TERM GOAL #3   Title demonstrate Rt knee AROM flexion to > or = to 110 degrees to allow for descending steps and squatting with ease   Time 8   Period Weeks   Status On-going   PT LONG TERM GOAL #4   Title wean from walker to use of cane or no device for all distances   Time 8   Period Weeks   Status Achieved   PT LONG TERM GOAL #5   Title stand and walk for 20 minutes without rest for independence in the home and community   Time 8   Period Weeks   Status Achieved   PT LONG TERM GOAL #6   Title improve Rt LE strength to ascend curbs with the Rt LE   Time 8   Period  Weeks   Status On-going   PT LONG TERM GOAL #7   Title report < or = to 3/10 Rt knee pain with standing and  walking   Time 8   Period Weeks   Status On-going               Plan - 11/20/15 1207    Clinical Impression Statement Pt with improved confidence with ambulation and improved functional flexion in right knee. Pt with decreased swelling of right knee. pt will continue to benefit from skilled PT, for edema and pain control, and to increase strength.    Pt will benefit from skilled therapeutic intervention in order to improve on the following deficits Abnormal gait;Decreased strength;Impaired flexibility;Increased edema;Pain;Decreased activity tolerance;Decreased range of motion;Decreased scar mobility;Difficulty walking   Rehab Potential Good   PT Frequency 3x / week   PT Duration 8 weeks   PT Treatment/Interventions ADLs/Self Care Home Management;Cryotherapy;Electrical Stimulation;Therapeutic exercise;Therapeutic activities;Functional mobility training;Manual techniques;Vasopneumatic Device;Passive range of motion;Scar mobilization;Gait training;Stair training;Patient/family education;Neuromuscular re-education;Ultrasound;Moist Heat   PT Next Visit Plan progress strength and AAROM, gait training   Consulted and Agree with Plan of Care Patient        Problem List Patient Active Problem List   Diagnosis Date Noted  . S/p reverse total shoulder arthroplasty 12/28/2014  . Abnormality of gait 04/25/2014  . Foot pain 06/03/2011  . Metatarsalgia 06/03/2011  . CHRONIC PAIN SYNDROME 12/05/2009  . INSOMNIA 08/15/2009  . INGROWN TOENAIL 06/06/2009  . LEG PAIN, BILATERAL 06/05/2009  . OTHER VITAMIN B12 DEFICIENCY ANEMIA 04/04/2009  . MALAISE AND FATIGUE 02/28/2009  . UNSPECIFIED HYPOTHYROIDISM 07/26/2008  . HYPERLIPIDEMIA 11/05/2007  . NEUROPATHY, IDIOPATHIC PERIPHERAL NEC 09/16/2007  . HYPERTENSION 01/28/2007  . OSTEOARTHRITIS 01/28/2007  . ROTATOR CUFF INJURY, RIGHT SHOULDER 01/28/2007  . OSTEOPENIA 01/28/2007    Destiny [name], Destiny [name] 11/20/2015 12:35 PM Physical  Therapy Progress Note  Dates of Reporting Period: 10/22/15 to 11/20/15  Objective Reports of Subjective Statement: Pt reports that she is feeling like she is getting back to normal.  Wants to be able to drive.    Objective Measurements: See above for AROM and FOTO score.  Goal Update: See above for goal update.    Plan: Continue to improve Rt LE AROM, strength, gait and edema management s/p knee replacement  Reason Skilled Services are Required: Pt with gait abnormality, reduced Rt knee strength and AROM and limited endurance s/p partial knee replacement to allow for return to prior level of function.     [facility] [address] [place], [place], [address] Phone: [phone]   Fax:  [phone]  Name: Destiny [name] MRN: [id number] Date of Birth: Sep 26, 1941

## 2015-11-23 ENCOUNTER — Ambulatory Visit: Payer: Medicare Other | Admitting: Physical Therapy

## 2015-11-23 ENCOUNTER — Encounter: Payer: Self-pay | Admitting: Physical Therapy

## 2015-11-23 DIAGNOSIS — M1710 Unilateral primary osteoarthritis, unspecified knee: Secondary | ICD-10-CM

## 2015-11-23 DIAGNOSIS — R29898 Other symptoms and signs involving the musculoskeletal system: Secondary | ICD-10-CM

## 2015-11-23 DIAGNOSIS — M25661 Stiffness of right knee, not elsewhere classified: Secondary | ICD-10-CM

## 2015-11-23 DIAGNOSIS — R600 Localized edema: Secondary | ICD-10-CM

## 2015-11-23 DIAGNOSIS — R269 Unspecified abnormalities of gait and mobility: Secondary | ICD-10-CM

## 2015-11-23 NOTE — Therapy (Signed)
[facility] [address] [place], [place], [address] Phone: [phone]   Fax:  [phone]  Physical Therapy Treatment  Patient Details  Name: Destiny [name] MRN: [id number] Date of Birth: 1941-01-24 Referring Provider: Eden Lathe  Encounter Date: 11/23/2015      PT End of Session - 11/23/15 1125    Visit Number 11   Number of Visits 20   Date for PT Re-Evaluation 12/17/15   PT Start Time 1050   PT Stop Time 1155   PT Time Calculation (min) 65 min   Activity Tolerance Patient tolerated treatment well   Behavior During Therapy [facility] for tasks assessed/performed      Past Medical History  Diagnosis Date  . Colon polyps   . Complication of anesthesia     BP drops with fentyl and versed  . Hypertension   . Hypothyroidism   . Anxiety   . Arthritis   . Hepatitis     hep A  1976    Past Surgical History  Procedure Laterality Date  . Thyroidectomy, partial      one lobe removed  . Abdominal hysterectomy    . Rotator cufff    . Appendectomy    . Cesarean section      times 2  . Foot surgery Bilateral   . Fracture surgery      broken ankle  . Medial partial knee replacement Left     done at [facility]  . Varicose vein surgery Right   . Back surgery       fusion  . Eye surgery Left     cataract removed  . Reverse total shoulder arthroplasty Right 12/28/2014    DR CHANDLER  . Reverse shoulder arthroplasty Right 12/28/2014    Procedure: REVERSE SHOULDER ARTHROPLASTY;  Surgeon: Nita Sells, MD;  Location: [facility];  Service: Orthopedics;  Laterality: Right;  Right reverse total shoulder arthroplasty    There were no vitals filed for this visit.  Visit Diagnosis:  Knee stiffness, right  Leg edema, right  Abnormality of gait  Weakness of right leg  Primary osteoarthritis of one knee      Subjective Assessment - 11/23/15 1055    Subjective Pt has MD appointment on January 4th. She rates her  pain as 3/10.    Pertinent History Rt reverse total shoulder 12/2014,Lt medial compartment total knee replacement 11/2012   Limitations Standing;Walking   How long can you sit comfortably? 30 minutes   How long can you stand comfortably? < 5 minutes   How long can you walk comfortably? 5 minutes max   Diagnostic tests MRI- OA of Rt knee   Currently in Pain? Yes   Pain Score 3    Pain Location Knee   Pain Orientation Right   Pain Descriptors / Indicators Sore;Tightness   Pain Type Surgical pain   Pain Onset More than a month ago   Aggravating Factors  standing, walking, prolonged sitting makes it feel stiff. bending the knee   Pain Relieving Factors rest, elevatio, tylenol, ice    Multiple Pain Sites No                         [facility] Adult PT Treatment/Exercise - 11/23/15 0001    Bed Mobility   Bed Mobility --  Pt has MD appointment on January 4th   Ambulation/Gait   Stairs Yes   Stairs Assistance 6: Modified independent (Device/Increase time)  Stair Management Technique One rail Right   Number of Stairs 16   Height of Stairs 6   Exercises   Exercises Knee/Hip   Knee/Hip Exercises: Stretches   Sports administrator Right;5 reps;20 seconds  on stairs   Knee/Hip Exercises: Aerobic   Recumbent Bike Bike rocking 64min, need to discontinue due to cramp in left LE   Nustep Level 2x 10 minutes  seat#6, arms#8, 0.31mph   Knee/Hip Exercises: Machines for Strengthening   Cybex Leg Press 65# 2 x 10, 65# 1x10 with B LE,  30# Rt LE 2 x 10  seat#5   Knee/Hip Exercises: Standing   Lateral Step Up Right;2 sets;10 reps;Hand Hold: 2;Step Height: 6"   Forward Step Up Right;2 sets;10 reps;Hand Hold: 2;Step Height: 6"   Step Down Other (comment)  tapping down with left, B UE support, pt fearful   Rocker Board 2 minutes  with B UE support   Modalities   Modalities Vasopneumatic   Vasopneumatic   Number Minutes Vasopneumatic  15 minutes   Vasopnuematic Location  Knee   Vasopneumatic  Pressure Medium   Vasopneumatic Temperature  3 snowflakes                  PT Short Term Goals - 11/20/15 1213    PT SHORT TERM GOAL #1   Title be independent in HEP for after surgery   Time 1   Period Weeks   Status Achieved   PT SHORT TERM GOAL #2   Title be independent in initial HEP   Time 4   Period Weeks   Status Achieved   PT SHORT TERM GOAL #3   Title demonstrate Rt knee AROM flexio nto > 95 degrees to sit without substitution   Time 4   Period Weeks   Status On-going   PT SHORT TERM GOAL #4   Title reduce Rt knee pain to < or = to 4/10 with standing activity   Time 4   Period Weeks   Status On-going   PT SHORT TERM GOAL #5   Title stand and walk for 10 minutes without rest to improve independence at home   Time 4   Period Weeks   Status On-going           PT Long Term Goals - 11/20/15 1214    PT LONG TERM GOAL #1   Title be independent in advanced HEP   Time 8   Period Weeks   Status On-going   PT LONG TERM GOAL #2   Title reduce FOTO to < or = to 45% limitaiton   Time 8   Period Weeks   Status On-going   PT LONG TERM GOAL #3   Title demonstrate Rt knee AROM flexion to > or = to 110 degrees to allow for descending steps and squatting with ease   Time 8   Period Weeks   Status On-going   PT LONG TERM GOAL #4   Title wean from walker to use of cane or no device for all distances   Time 8   Period Weeks   Status Achieved   PT LONG TERM GOAL #5   Title stand and walk for 20 minutes without rest for independence in the home and community   Time 8   Period Weeks   Status Achieved   PT LONG TERM GOAL #6   Title improve Rt LE strength to ascend curbs with the Rt LE   Time 8   Period  Weeks   Status On-going   PT LONG TERM GOAL #7   Title report < or = to 3/10 Rt knee pain with standing and walking   Time 8   Period Weeks   Status On-going               Plan - 11/23/15 1126    Clinical Impression Statement Pt continues to  improve with confidence with ambulation and increase strength. Swelling in the knee is down. Pt will continue to benefit from skilled PT for edema control, ROM and strength    Pt will benefit from skilled therapeutic intervention in order to improve on the following deficits Abnormal gait;Decreased strength;Impaired flexibility;Increased edema;Pain;Decreased activity tolerance;Decreased range of motion;Decreased scar mobility;Difficulty walking   Rehab Potential Good   PT Frequency 3x / week   PT Duration 8 weeks   PT Treatment/Interventions ADLs/Self Care Home Management;Cryotherapy;Electrical Stimulation;Therapeutic exercise;Therapeutic activities;Functional mobility training;Manual techniques;Vasopneumatic Device;Passive range of motion;Scar mobilization;Gait training;Stair training;Patient/family education;Neuromuscular re-education;Ultrasound;Moist Heat   PT Next Visit Plan progress strength and ROM, gait training   Consulted and Agree with Plan of Care Patient        Problem List Patient Active Problem List   Diagnosis Date Noted  . S/p reverse total shoulder arthroplasty 12/28/2014  . Abnormality of gait 04/25/2014  . Foot pain 06/03/2011  . Metatarsalgia 06/03/2011  . CHRONIC PAIN SYNDROME 12/05/2009  . INSOMNIA 08/15/2009  . INGROWN TOENAIL 06/06/2009  . LEG PAIN, BILATERAL 06/05/2009  . OTHER VITAMIN B12 DEFICIENCY ANEMIA 04/04/2009  . MALAISE AND FATIGUE 02/28/2009  . UNSPECIFIED HYPOTHYROIDISM 07/26/2008  . HYPERLIPIDEMIA 11/05/2007  . NEUROPATHY, IDIOPATHIC PERIPHERAL NEC 09/16/2007  . HYPERTENSION 01/28/2007  . OSTEOARTHRITIS 01/28/2007  . ROTATOR CUFF INJURY, RIGHT SHOULDER 01/28/2007  . OSTEOPENIA 01/28/2007    NAUMANN-HOUEGNIFIO,Denora Wysocki PTA 11/23/2015, 11:40 AM  [facility] [address] [place], [place], [address] Phone: [phone]   Fax:  [phone]  Name: JYAH BONNET MRN: [id number] Date  of Birth: 1941/10/31

## 2015-11-27 ENCOUNTER — Encounter: Payer: Self-pay | Admitting: Physical Therapy

## 2015-11-27 ENCOUNTER — Ambulatory Visit: Payer: Medicare Other | Attending: Orthopedic Surgery | Admitting: Physical Therapy

## 2015-11-27 DIAGNOSIS — R269 Unspecified abnormalities of gait and mobility: Secondary | ICD-10-CM | POA: Diagnosis present

## 2015-11-27 DIAGNOSIS — R600 Localized edema: Secondary | ICD-10-CM | POA: Diagnosis present

## 2015-11-27 DIAGNOSIS — R29898 Other symptoms and signs involving the musculoskeletal system: Secondary | ICD-10-CM | POA: Insufficient documentation

## 2015-11-27 DIAGNOSIS — M1710 Unilateral primary osteoarthritis, unspecified knee: Secondary | ICD-10-CM | POA: Insufficient documentation

## 2015-11-27 DIAGNOSIS — M25661 Stiffness of right knee, not elsewhere classified: Secondary | ICD-10-CM | POA: Insufficient documentation

## 2015-11-27 NOTE — Therapy (Signed)
[facility] [address], [place], [place], [address] Phone: [phone]   Fax:  [phone]  Physical Therapy Treatment  Patient Details  Name: Destiny [name] MRN: [id number] Date of Birth: 11-20-1941 Referring Provider: Eden Lathe  Encounter Date: 11/27/2015      PT End of Session - 11/27/15 1236    Visit Number 12   Number of Visits 20   Date for PT Re-Evaluation 12/17/15   PT Start Time 1228   PT Stop Time 1327   PT Time Calculation (min) 59 min   Activity Tolerance Patient tolerated treatment well   Behavior During Therapy [facility] for tasks assessed/performed      Past Medical History  Diagnosis Date  . Colon polyps   . Complication of anesthesia     BP drops with fentyl and versed  . Hypertension   . Hypothyroidism   . Anxiety   . Arthritis   . Hepatitis     hep A  1976    Past Surgical History  Procedure Laterality Date  . Thyroidectomy, partial      one lobe removed  . Abdominal hysterectomy    . Rotator cufff    . Appendectomy    . Cesarean section      times 2  . Foot surgery Bilateral   . Fracture surgery      broken ankle  . Medial partial knee replacement Left     done at [facility]  . Varicose vein surgery Right   . Back surgery       fusion  . Eye surgery Left     cataract removed  . Reverse total shoulder arthroplasty Right 12/28/2014    DR CHANDLER  . Reverse shoulder arthroplasty Right 12/28/2014    Procedure: REVERSE SHOULDER ARTHROPLASTY;  Surgeon: Nita Sells, MD;  Location: [place];  Service: Orthopedics;  Laterality: Right;  Right reverse total shoulder arthroplasty    There were no vitals filed for this visit.  Visit Diagnosis:  Knee stiffness, right  Leg edema, right  Abnormality of gait  Weakness of right leg  Primary osteoarthritis of one knee      Subjective Assessment - 11/27/15 1232    Subjective Pt has MD appointment tomorrow in [facility]. Pt  rates her pain in Rt knee as 4/10. Pt would like to be able to drive again to regain her independence. Pt complains of swelling in Rt LE    Pertinent History Rt reverse total shoulder 12/2014,Lt medial compartment total knee replacement 11/2012   Limitations Standing;Walking   How long can you sit comfortably? 30 minutes   How long can you stand comfortably? up to 15 minutes   How long can you walk comfortably? 10 -15 minutes max   Diagnostic tests MRI- OA of Rt knee   Currently in Pain? Yes   Pain Score 4    Pain Location Knee   Pain Orientation Right   Pain Descriptors / Indicators Throbbing   Pain Type Surgical pain   Pain Onset More than a month ago   Pain Frequency Intermittent   Aggravating Factors  standing, walking,m prolonged sitting makes it feel stiff, bending the knee    Pain Relieving Factors rest, elevation, pain meds /Tylenol   Multiple Pain Sites No            [facility] PT Assessment - 11/27/15 0001    Assessment   Medical Diagnosis primary OA of the Rt knee (M17.11), s/p unicompartment  knee replacment on the Rt   Referring Provider Poehling   Onset Date/Surgical Date 10/19/15  Rt partial knee   Next MD Visit 11/28/2015   Prior Function   Level of Independence Independent   Vocation Retired   Observation/Other Assessments   Focus on Therapeutic Outcomes (FOTO)  56%   ROM / Strength   AROM / PROM / Strength AROM   AROM   Overall AROM  Deficits   AROM Assessment Site Knee   Right/Left Knee Right   Right Knee Extension 0   Right Knee Flexion 105   Palpation   Palpation comment mid of patella 16, 5" below center of patella 15", 5" above mid of patella 18                     [facility] Adult PT Treatment/Exercise - 11/27/15 0001    Ambulation/Gait   Stairs Yes   Stairs Assistance 6: Modified independent (Device/Increase time)   Stair Management Technique One rail Right   Number of Stairs 16   Height of Stairs 6   Exercises   Exercises Knee/Hip    Knee/Hip Exercises: Stretches   Sports administrator Right;5 reps;20 seconds  on stairs   Knee/Hip Exercises: Aerobic   Nustep Level 2x 10 minutes  Seat#9, arms# 10,   0.11mh   Knee/Hip Exercises: Machines for Strengthening   Cybex Leg Press 65# 2 x 10,  B LE,  30# Rt LE 2 x 10   Knee/Hip Exercises: Standing   Lateral Step Up Right;2 sets;10 reps;Hand Hold: 2;Step Height: 6"   Forward Step Up Right;2 sets;10 reps;Hand Hold: 2;Step Height: 6"   Step Down Other (comment)  tapping down with left, B UE support   Rocker Board 2 minutes  with B UE support   Rebounder weight shifting 3 ways incr time to x 1 minute each   Modalities   Modalities Vasopneumatic   Vasopneumatic   Number Minutes Vasopneumatic  15 minutes   Vasopnuematic Location  Knee   Vasopneumatic Pressure Medium   Vasopneumatic Temperature  3 snowflakes                  PT Short Term Goals - 11/27/15 1246    PT SHORT TERM GOAL #1   Title be independent in HEP for after surgery   Time 1   Period Weeks   Status Achieved   PT SHORT TERM GOAL #2   Title be independent in initial HEP   Time 4   Period Weeks   Status Achieved   PT SHORT TERM GOAL #3   Title demonstrate Rt knee AROM flexion into > 95 degrees to sit without substitution  105 degrees as of Jan 3rd, 2017   Time 4   Period Weeks   Status Achieved   PT SHORT TERM GOAL #4   Title reduce Rt knee pain to < or = to 4/10 with standing activity   Time 4   Period Weeks   Status Partially Met   PT SHORT TERM GOAL #5   Title stand and walk for 10 minutes without rest to improve independence at home   Time 4   Period Weeks   Status Achieved           PT Long Term Goals - 11/20/15 1214    PT LONG TERM GOAL #1   Title be independent in advanced HEP   Time 8   Period Weeks   Status On-going   PT  LONG TERM GOAL #2   Title reduce FOTO to < or = to 45% limitaiton   Time 8   Period Weeks   Status On-going   PT LONG TERM GOAL #3   Title  demonstrate Rt knee AROM flexion to > or = to 110 degrees to allow for descending steps and squatting with ease   Time 8   Period Weeks   Status On-going   PT LONG TERM GOAL #4   Title wean from walker to use of cane or no device for all distances   Time 8   Period Weeks   Status Achieved   PT LONG TERM GOAL #5   Title stand and walk for 20 minutes without rest for independence in the home and community   Time 8   Period Weeks   Status Achieved   PT LONG TERM GOAL #6   Title improve Rt LE strength to ascend curbs with the Rt LE   Time 8   Period Weeks   Status On-going   PT LONG TERM GOAL #7   Title report < or = to 3/10 Rt knee pain with standing and walking   Time 8   Period Weeks   Status On-going               Plan - 11/27/15 1238    Clinical Impression Statement Pt continues to improve with confidence with ambulation and strength. AROM in degrees flexion: 105  extension 0. Strength Rt knee extension 3+/5, knee flexion 4/5; hip flexion 4+/5. Pt will continue to benefit from skilled Physical Therapy for edema control, ROM and strength   Pt will benefit from skilled therapeutic intervention in order to improve on the following deficits Abnormal gait;Decreased strength;Impaired flexibility;Increased edema;Pain;Decreased activity tolerance;Decreased range of motion;Decreased scar mobility;Difficulty walking   Rehab Potential Good   PT Frequency 3x / week   PT Duration 8 weeks   PT Next Visit Plan See what MD has to say   Consulted and Agree with Plan of Care Patient        Problem List Patient Active Problem List   Diagnosis Date Noted  . S/p reverse total shoulder arthroplasty 12/28/2014  . Abnormality of gait 04/25/2014  . Foot pain 06/03/2011  . Metatarsalgia 06/03/2011  . CHRONIC PAIN SYNDROME 12/05/2009  . INSOMNIA 08/15/2009  . INGROWN TOENAIL 06/06/2009  . LEG PAIN, BILATERAL 06/05/2009  . OTHER VITAMIN B12 DEFICIENCY ANEMIA 04/04/2009  . MALAISE AND  FATIGUE 02/28/2009  . UNSPECIFIED HYPOTHYROIDISM 07/26/2008  . HYPERLIPIDEMIA 11/05/2007  . NEUROPATHY, IDIOPATHIC PERIPHERAL NEC 09/16/2007  . HYPERTENSION 01/28/2007  . OSTEOARTHRITIS 01/28/2007  . ROTATOR CUFF INJURY, RIGHT SHOULDER 01/28/2007  . OSTEOPENIA 01/28/2007    NAUMANN-HOUEGNIFIO,Destiny [name] PTA 11/27/2015, 1:29 PM  [facility] [address] [place], [place], [address] Phone: [phone]   Fax:  [phone]  Name: Destiny [name] MRN: [id number] Date of Birth: May 01, 1941

## 2015-11-30 ENCOUNTER — Encounter: Payer: Self-pay | Admitting: Physical Therapy

## 2015-11-30 ENCOUNTER — Ambulatory Visit: Payer: Medicare Other | Admitting: Physical Therapy

## 2015-11-30 DIAGNOSIS — M25661 Stiffness of right knee, not elsewhere classified: Secondary | ICD-10-CM

## 2015-11-30 DIAGNOSIS — R600 Localized edema: Secondary | ICD-10-CM

## 2015-11-30 DIAGNOSIS — R269 Unspecified abnormalities of gait and mobility: Secondary | ICD-10-CM

## 2015-11-30 DIAGNOSIS — R29898 Other symptoms and signs involving the musculoskeletal system: Secondary | ICD-10-CM

## 2015-11-30 NOTE — Therapy (Signed)
[facility] [address], [place], [place], [address] Phone: [phone]   Fax:  [phone]  Physical Therapy Treatment  Patient Details  Name: Destiny [name] MRN: [id number] Date of Birth: 1940/12/03 Referring Provider: Eden Lathe  Encounter Date: 11/30/2015      PT End of Session - 11/30/15 1102    Visit Number 13   Number of Visits 20   Date for PT Re-Evaluation 12/17/15   PT Start Time 1058   PT Stop Time 1158   PT Time Calculation (min) 60 min   Activity Tolerance Patient tolerated treatment well   Behavior During Therapy [facility] for tasks assessed/performed      Past Medical History  Diagnosis Date  . Colon polyps   . Complication of anesthesia     BP drops with fentyl and versed  . Hypertension   . Hypothyroidism   . Anxiety   . Arthritis   . Hepatitis     hep A  1976    Past Surgical History  Procedure Laterality Date  . Thyroidectomy, partial      one lobe removed  . Abdominal hysterectomy    . Rotator cufff    . Appendectomy    . Cesarean section      times 2  . Foot surgery Bilateral   . Fracture surgery      broken ankle  . Medial partial knee replacement Left     done at [facility]  . Varicose vein surgery Right   . Back surgery       fusion  . Eye surgery Left     cataract removed  . Reverse total shoulder arthroplasty Right 12/28/2014    DR CHANDLER  . Reverse shoulder arthroplasty Right 12/28/2014    Procedure: REVERSE SHOULDER ARTHROPLASTY;  Surgeon: Nita Sells, MD;  Location: [place];  Service: Orthopedics;  Laterality: Right;  Right reverse total shoulder arthroplasty    There were no vitals filed for this visit.  Visit Diagnosis:  Knee stiffness, right  Leg edema, right  Abnormality of gait  Weakness of right leg      Subjective Assessment - 11/30/15 1102    Subjective Pt is back to driving. Pt rates her pain in right knee as 2/10. She does not more need to  wear her compression socks.   Pertinent History Rt reverse total shoulder 12/2014,Lt medial compartment total knee replacement 11/2012   Limitations Standing;Walking   How long can you sit comfortably? 30 minutes   How long can you stand comfortably? up to 15 minutes   How long can you walk comfortably? 10 -15 minutes max   Diagnostic tests MRI- OA of Rt knee   Currently in Pain? Yes   Pain Score 2    Pain Location Knee   Pain Orientation Right   Pain Descriptors / Indicators Throbbing   Pain Type Surgical pain   Pain Onset More than a month ago   Pain Frequency Intermittent   Aggravating Factors  standing, walking, prolonged sitting makes it feel stiff, bending the knee   Pain Relieving Factors rest, elevation, pain meds/Tylenol   Multiple Pain Sites No                         [facility] Adult PT Treatment/Exercise - 11/30/15 0001    Ambulation/Gait   Stairs Yes   Stairs Assistance 6: Modified independent (Device/Increase time)   Stair Management Technique One rail Right  Number of Stairs 16   Height of Stairs 6   Exercises   Exercises Knee/Hip   Knee/Hip Exercises: Stretches   Sports administrator Right;5 reps;20 seconds  on stairs   Knee/Hip Exercises: Aerobic   Nustep Level 2x 10 minutes  seat#8, arms#10, 0.64mh    Knee/Hip Exercises: Machines for Strengthening   Cybex Leg Press 70# 3 x 10,  B LE,  35# Rt LE 2 x 10  seat #5   Knee/Hip Exercises: Standing   Lateral Step Up Right;2 sets;10 reps;Hand Hold: 2;Step Height: 6"  may try on 8" step   Forward Step Up Right;2 sets;10 reps;Hand Hold: 2;Step Height: 6"  may try 8" step   Step Down Other (comment)  tappping down with Lt LE   Rocker Board 2 minutes  with B UE support   Rebounder weight shifting 3 ways incr time to x 1 minute each   Modalities   Modalities Vasopneumatic   Vasopneumatic   Number Minutes Vasopneumatic  15 minutes   Vasopnuematic Location  Knee   Vasopneumatic Pressure Medium    Vasopneumatic Temperature  3 snowflakes                  PT Short Term Goals - 11/27/15 1246    PT SHORT TERM GOAL #1   Title be independent in HEP for after surgery   Time 1   Period Weeks   Status Achieved   PT SHORT TERM GOAL #2   Title be independent in initial HEP   Time 4   Period Weeks   Status Achieved   PT SHORT TERM GOAL #3   Title demonstrate Rt knee AROM flexion into > 95 degrees to sit without substitution  105 degrees as of Jan 3rd, 2017   Time 4   Period Weeks   Status Achieved   PT SHORT TERM GOAL #4   Title reduce Rt knee pain to < or = to 4/10 with standing activity   Time 4   Period Weeks   Status Partially Met   PT SHORT TERM GOAL #5   Title stand and walk for 10 minutes without rest to improve independence at home   Time 4   Period Weeks   Status Achieved           PT Long Term Goals - 11/20/15 1214    PT LONG TERM GOAL #1   Title be independent in advanced HEP   Time 8   Period Weeks   Status On-going   PT LONG TERM GOAL #2   Title reduce FOTO to < or = to 45% limitaiton   Time 8   Period Weeks   Status On-going   PT LONG TERM GOAL #3   Title demonstrate Rt knee AROM flexion to > or = to 110 degrees to allow for descending steps and squatting with ease   Time 8   Period Weeks   Status On-going   PT LONG TERM GOAL #4   Title wean from walker to use of cane or no device for all distances   Time 8   Period Weeks   Status Achieved   PT LONG TERM GOAL #5   Title stand and walk for 20 minutes without rest for independence in the home and community   Time 8   Period Weeks   Status Achieved   PT LONG TERM GOAL #6   Title improve Rt LE strength to ascend curbs with the Rt LE  Time 8   Period Weeks   Status On-going   PT LONG TERM GOAL #7   Title report < or = to 3/10 Rt knee pain with standing and walking   Time 8   Period Weeks   Status On-going               Plan - 11/30/15 1103    Clinical Impression  Statement Pt with improved strength and confidence with ambulation. Pt will continie to benefit from skilled PT for edema control, ROM and strength.    Pt will benefit from skilled therapeutic intervention in order to improve on the following deficits Abnormal gait;Decreased strength;Impaired flexibility;Increased edema;Pain;Decreased activity tolerance;Decreased range of motion;Decreased scar mobility;Difficulty walking   Rehab Potential Good   PT Frequency 3x / week   PT Duration 8 weeks   PT Treatment/Interventions ADLs/Self Care Home Management;Cryotherapy;Electrical Stimulation;Therapeutic exercise;Therapeutic activities;Functional mobility training;Manual techniques;Vasopneumatic Device;Passive range of motion;Scar mobilization;Gait training;Stair training;Patient/family education;Neuromuscular re-education;Ultrasound;Moist Heat   PT Next Visit Plan See what MD has to say   Consulted and Agree with Plan of Care Patient        Problem List Patient Active Problem List   Diagnosis Date Noted  . S/p reverse total shoulder arthroplasty 12/28/2014  . Abnormality of gait 04/25/2014  . Foot pain 06/03/2011  . Metatarsalgia 06/03/2011  . CHRONIC PAIN SYNDROME 12/05/2009  . INSOMNIA 08/15/2009  . INGROWN TOENAIL 06/06/2009  . LEG PAIN, BILATERAL 06/05/2009  . OTHER VITAMIN B12 DEFICIENCY ANEMIA 04/04/2009  . MALAISE AND FATIGUE 02/28/2009  . UNSPECIFIED HYPOTHYROIDISM 07/26/2008  . HYPERLIPIDEMIA 11/05/2007  . NEUROPATHY, IDIOPATHIC PERIPHERAL NEC 09/16/2007  . HYPERTENSION 01/28/2007  . OSTEOARTHRITIS 01/28/2007  . ROTATOR CUFF INJURY, RIGHT SHOULDER 01/28/2007  . OSTEOPENIA 01/28/2007    NAUMANN-HOUEGNIFIO,Zaya Kessenich PTA 11/30/2015, 11:44 AM  [facility] [address], [place], [place], [address] Phone: [phone]   Fax:  [phone]  Name: Destiny [name] MRN: [id number] Date of Birth: October 20, 1941

## 2015-12-03 ENCOUNTER — Encounter: Payer: Medicare Other | Admitting: Physical Therapy

## 2015-12-06 ENCOUNTER — Ambulatory Visit: Payer: Medicare Other

## 2015-12-06 DIAGNOSIS — R29898 Other symptoms and signs involving the musculoskeletal system: Secondary | ICD-10-CM

## 2015-12-06 DIAGNOSIS — R600 Localized edema: Secondary | ICD-10-CM

## 2015-12-06 DIAGNOSIS — R269 Unspecified abnormalities of gait and mobility: Secondary | ICD-10-CM

## 2015-12-06 DIAGNOSIS — M25661 Stiffness of right knee, not elsewhere classified: Secondary | ICD-10-CM | POA: Diagnosis not present

## 2015-12-06 NOTE — Therapy (Signed)
[facility] [address] [place], [place], [address] Phone: [phone]   Fax:  [phone]  Physical Therapy Treatment  Patient Details  Name: Destiny [name] MRN: [id number] Date of Birth: 09/27/1941 Referring Provider: Eden Lathe  Encounter Date: 12/06/2015      PT End of Session - 12/06/15 1223    Visit Number 14   Number of Visits 20   Date for PT Re-Evaluation 12/17/15   PT Start Time 1143   PT Stop Time 1240   PT Time Calculation (min) 57 min   Activity Tolerance Patient tolerated treatment well   Behavior During Therapy [facility] for tasks assessed/performed      Past Medical History  Diagnosis Date  . Colon polyps   . Complication of anesthesia     BP drops with fentyl and versed  . Hypertension   . Hypothyroidism   . Anxiety   . Arthritis   . Hepatitis     hep A  1976    Past Surgical History  Procedure Laterality Date  . Thyroidectomy, partial      one lobe removed  . Abdominal hysterectomy    . Rotator cufff    . Appendectomy    . Cesarean section      times 2  . Foot surgery Bilateral   . Fracture surgery      broken ankle  . Medial partial knee replacement Left     done at [facility]  . Varicose vein surgery Right   . Back surgery       fusion  . Eye surgery Left     cataract removed  . Reverse total shoulder arthroplasty Right 12/28/2014    DR CHANDLER  . Reverse shoulder arthroplasty Right 12/28/2014    Procedure: REVERSE SHOULDER ARTHROPLASTY;  Surgeon: Nita Sells, MD;  Location: [place];  Service: Orthopedics;  Laterality: Right;  Right reverse total shoulder arthroplasty    There were no vitals filed for this visit.  Visit Diagnosis:  Knee stiffness, right  Leg edema, right  Abnormality of gait  Weakness of right leg      Subjective Assessment - 12/06/15 1213    Subjective MD is pleased with progress.  Pt reports stiffness in the morning hours now.      Currently in Pain? Yes   Pain Score 2    Pain Location Knee   Pain Orientation Right   Pain Descriptors / Indicators Tightness   Pain Type Surgical pain   Pain Onset More than a month ago   Pain Frequency Intermittent   Aggravating Factors  standing, walking, prolonged sitting, bending the knee   Pain Relieving Factors rest, elevation, pain meds                         [facility] Adult PT Treatment/Exercise - 12/06/15 0001    Knee/Hip Exercises: Aerobic   Stationary Bike Level 1x 4 minutes   Nustep Level 2x 10 minutes  seat#8, arms#10, 0.78mph    Knee/Hip Exercises: Machines for Strengthening   Cybex Leg Press 70# 3 x 10,  B LE,  35# Rt LE 2 x 10  seat #5   Knee/Hip Exercises: Standing   Lateral Step Up Right;2 sets;10 reps;Hand Hold: 2;Step Height: 8"  may try on 8" step   Forward Step Up Right;2 sets;10 reps;Hand Hold: 2;Step Height: 8"  may try 8" step   Step Down Other (comment)  tappping down with  Lt LE   Rocker Board 2 minutes  with B UE support   Rebounder weight shifting 3 ways incr time to x 1 minute each   Modalities   Modalities Vasopneumatic   Vasopneumatic   Number Minutes Vasopneumatic  15 minutes   Vasopnuematic Location  Knee   Vasopneumatic Pressure Medium   Vasopneumatic Temperature  3 snowflakes                  PT Short Term Goals - 12/06/15 1209    PT SHORT TERM GOAL #4   Title reduce Rt knee pain to < or = to 4/10 with standing activity   Status Achieved   PT SHORT TERM GOAL #5   Title stand and walk for 10 minutes without rest to improve independence at home   Status Achieved           PT Long Term Goals - 12/06/15 1210    PT LONG TERM GOAL #1   Title be independent in advanced HEP   Time 8   Period Weeks   Status On-going   PT LONG TERM GOAL #3   Title demonstrate Rt knee AROM flexion to > or = to 110 degrees to allow for descending steps and squatting with ease   Time 8   Period Weeks   Status On-going   PT  LONG TERM GOAL #4   Title wean from walker to use of cane or no device for all distances   Status Achieved   PT LONG TERM GOAL #5   Title stand and walk for 20 minutes without rest for independence in the home and community   Status Achieved               Plan - 12/06/15 1203    Clinical Impression Statement Pt has returned to driving.  Pt continues to demonstrate Rt knee weakness, edema and stiffness s/p TKA.  Pt tolerated 8" step-ups well in the clinic today.  Continued challenge with step-down on 6" step. Pt is able to walk for 30 minutes in the [facility]. Pt will continue to benefit from skilled PT for strength, ROM and edema management.     Rehab Potential Good   PT Frequency 3x / week   PT Duration 8 weeks   PT Treatment/Interventions ADLs/Self Care Home Management;Cryotherapy;Electrical Stimulation;Therapeutic exercise;Therapeutic activities;Functional mobility training;Manual techniques;Vasopneumatic Device;Passive range of motion;Scar mobilization;Gait training;Stair training;Patient/family education;Neuromuscular re-education;Ultrasound;Moist Heat   PT Next Visit Plan Rt knee strength, ROM and endurance training.  Edema management.   Consulted and Agree with Plan of Care Patient        Problem List Patient Active Problem List   Diagnosis Date Noted  . S/p reverse total shoulder arthroplasty 12/28/2014  . Abnormality of gait 04/25/2014  . Foot pain 06/03/2011  . Metatarsalgia 06/03/2011  . CHRONIC PAIN SYNDROME 12/05/2009  . INSOMNIA 08/15/2009  . INGROWN TOENAIL 06/06/2009  . LEG PAIN, BILATERAL 06/05/2009  . OTHER VITAMIN B12 DEFICIENCY ANEMIA 04/04/2009  . MALAISE AND FATIGUE 02/28/2009  . UNSPECIFIED HYPOTHYROIDISM 07/26/2008  . HYPERLIPIDEMIA 11/05/2007  . NEUROPATHY, IDIOPATHIC PERIPHERAL NEC 09/16/2007  . HYPERTENSION 01/28/2007  . OSTEOARTHRITIS 01/28/2007  . ROTATOR CUFF INJURY, RIGHT SHOULDER 01/28/2007  . OSTEOPENIA 01/28/2007     Julee Stoll, PT 12/06/2015, 12:26 PM   [facility] [address], [place], [place], [address] Phone: [phone]   Fax:  [phone]  Name: Destiny [name] MRN: [id number] Date of Birth: Apr 03, 1941

## 2015-12-13 ENCOUNTER — Encounter: Payer: Self-pay | Admitting: Physical Therapy

## 2015-12-13 ENCOUNTER — Ambulatory Visit: Payer: Medicare Other | Admitting: Physical Therapy

## 2015-12-13 DIAGNOSIS — R600 Localized edema: Secondary | ICD-10-CM

## 2015-12-13 DIAGNOSIS — R29898 Other symptoms and signs involving the musculoskeletal system: Secondary | ICD-10-CM

## 2015-12-13 DIAGNOSIS — M25661 Stiffness of right knee, not elsewhere classified: Secondary | ICD-10-CM

## 2015-12-13 DIAGNOSIS — M1710 Unilateral primary osteoarthritis, unspecified knee: Secondary | ICD-10-CM

## 2015-12-13 DIAGNOSIS — R269 Unspecified abnormalities of gait and mobility: Secondary | ICD-10-CM

## 2015-12-13 NOTE — Patient Instructions (Signed)
Achilles / Gastroc, Standing    Stand, right foot behind, heel on floor and turned slightly out, leg straight, forward leg bent. Move hips forward. Hold 30___ seconds. Repeat _2__ times per session. Do __2_ sessions per day.  Copyright  VHI. All rights reserved.  KNEE: Extension, Long Arc Quad (Band)    Place band around leg and under other foot. Pull band forward until knee is straight. Hold __1_ seconds. Use _red_band, 10__per set, __3_ sets per day, _1__ days per week  Copyright  VHI. All rights reserved.  FLEXION: Sitting - Resistance Band (Active)    Sit with right leg extended. Against yellow resistance band, bend knee and draw foot backward. Complete _1__ sets of _10__ repetitions. Perform __1_ sessions per day.  http://gtsc.exer.us/231   Copyright  VHI. All rights reserved.  Straight Leg Raise    Tighten stomach and slowly raise locked right leg __6__ inches from floor. Repeat __30__ times per set. Do _1___ sets per session. Do __1__ sessions per day.  http://orth.exer.us/1103   Copyright  VHI. All rights reserved.  [place] [address] [place], [place] [address] Phone # [phone] Fax [phone]

## 2015-12-13 NOTE — Therapy (Addendum)
[facility] [address] [place], [place], [address] Phone: [phone]   Fax:  [phone]  Physical Therapy Treatment  Patient Details  Name: Destiny [name] MRN: [id number] Date of Birth: [birth year] Referring Provider: Dr. Henreitta Leber  Encounter Date: 12/13/2015      PT End of Session - 12/13/15 1140    Visit Number 15   Number of Visits 20  Medicare   Date for PT Re-Evaluation 01/28/16   PT Start Time 1100   PT Stop Time 1155   PT Time Calculation (min) 55 min   Activity Tolerance Patient limited by pain   Behavior During Therapy [facility] for tasks assessed/performed      Past Medical History  Diagnosis Date  . Colon polyps   . Complication of anesthesia     BP drops with fentyl and versed  . Hypertension   . Hypothyroidism   . Anxiety   . Arthritis   . Hepatitis     hep A  1976    Past Surgical History  Procedure Laterality Date  . Thyroidectomy, partial      one lobe removed  . Abdominal hysterectomy    . Rotator cufff    . Appendectomy    . Cesarean section      times 2  . Foot surgery Bilateral   . Fracture surgery      broken ankle  . Medial partial knee replacement Left     done at [facility]  . Varicose vein surgery Right   . Back surgery       fusion  . Eye surgery Left     cataract removed  . Reverse total shoulder arthroplasty Right 12/28/2014    DR CHANDLER  . Reverse shoulder arthroplasty Right 12/28/2014    Procedure: REVERSE SHOULDER ARTHROPLASTY;  Surgeon: Nita Sells, MD;  Location: [place];  Service: Orthopedics;  Laterality: Right;  Right reverse total shoulder arthroplasty    There were no vitals filed for this visit.  Visit Diagnosis:  Knee stiffness, right - Plan: PT plan of care cert/re-cert  Leg edema, right - Plan: PT plan of care cert/re-cert  Abnormality of gait - Plan: PT plan of care cert/re-cert  Weakness of right leg - Plan: PT plan of care  cert/re-cert  Primary osteoarthritis of one knee - Plan: PT plan of care cert/re-cert      Subjective Assessment - 12/13/15 1100    Subjective I over did it yesterday.  I would like clearer instructions for HEP.    Pertinent History Rt reverse total shoulder 12/2014,Lt medial compartment total knee replacement 11/2012   Limitations Standing;Walking   How long can you sit comfortably? 30 minutes   How long can you stand comfortably? up to 15 minutes   How long can you walk comfortably? 10 -15 minutes max   Diagnostic tests MRI- OA of Rt knee   Currently in Pain? Yes   Pain Score 4    Pain Location Knee   Pain Orientation Right;Medial   Pain Descriptors / Indicators Tightness   Pain Type Surgical pain   Pain Onset More than a month ago   Pain Frequency Intermittent   Aggravating Factors  standing, walking, prolonged sitting, bending the knee   Pain Relieving Factors rest, elevation, pain meds.    Multiple Pain Sites No            [facility] PT Assessment - 12/13/15 0001    Assessment   Medical  Diagnosis primary OA of the Rt knee (M17.11), s/p unicompartment knee replacment on the Rt   Referring Provider Dr. Henreitta Leber   Onset Date/Surgical Date 10/19/15  Rt partial knee   Precautions   Precautions None   Restrictions   Weight Bearing Restrictions No   Balance Screen   Has the patient fallen in the past 6 months No   Has the patient had a decrease in activity level because of a fear of falling?  No   Is the patient reluctant to leave their home because of a fear of falling?  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 assessed   Observation/Other Assessments   Focus on Therapeutic Outcomes (FOTO)  56%   AROM   AROM Assessment Site Knee   Right/Left Knee Right   Right Knee Extension 0   Right Knee Flexion 105   Palpation   Patella mobility decreased mobility going  superior and inferior   Palpation comment tenderness located in right lower quadricep   Ambulation/Gait   Stairs Yes   Stairs Assistance 7: Independent   Stair Management Technique Step to pattern;One rail Right   Number of Stairs 16   Height of Stairs 6     G-code: functional assessment tool is 56% limitation, functional limitation mobility: walking and moving around, goal status is CK and discharge status is CK. Earlie Counts, PT 12/17/2015 3:46 PM                  [facility] Adult PT Treatment/Exercise - 12/13/15 0001    Knee/Hip Exercises: Aerobic   Stationary Bike rock back and forth 6 min.   Knee/Hip Exercises: Machines for Strengthening   Cybex Leg Press 70# 3 x 10,  B LE,  30# Rt LE 1 x 10  seat #5; could not do 35# due to pain today   Modalities   Modalities Vasopneumatic   Vasopneumatic   Number Minutes Vasopneumatic  15 minutes   Vasopnuematic Location  Knee   Vasopneumatic Pressure Medium   Vasopneumatic Temperature  3 snowflakes   Manual Therapy   Manual Therapy Soft tissue mobilization;Joint mobilization   Joint Mobilization right patella for superior and inferior, anterior glide onf right knee with flexion   Soft tissue mobilization right quadriceps, right hamstring, and around patella                PT Education - 12/13/15 1126    Education provided Yes   Education Details knee theraband ex, gastroc stretch, SLR   Person(s) Educated Patient   Methods Explanation;Demonstration;Verbal cues;Handout   Comprehension Returned demonstration;Verbalized understanding          PT Short Term Goals - 12/06/15 1209    PT SHORT TERM GOAL #4   Title reduce Rt knee pain to < or = to 4/10 with standing activity   Status Achieved   PT SHORT TERM GOAL #5   Title stand and walk for 10 minutes without rest to improve independence at home   Status Achieved           PT Long Term Goals - 12/13/15 1102    PT LONG TERM GOAL #1   Title be independent in  advanced HEP   Time 8   Status On-going  still learning   PT LONG TERM GOAL #2   Title reduce FOTO to < or = to 45%  limitaiton   Time 8   Period Weeks   Status On-going  will do on 20th visit   PT LONG TERM GOAL #3   Title demonstrate Rt knee AROM flexion to > or = to 110 degrees to allow for descending steps and squatting with ease   Time 8   Period Weeks   Status On-going   PT LONG TERM GOAL #4   Title wean from walker to use of cane or no device for all distances   Time 8   Period Weeks   Status Achieved   PT LONG TERM GOAL #5   Time 8   Period Weeks   Status Achieved   PT LONG TERM GOAL #6   Title improve Rt LE strength to ascend curbs with the Rt LE   Time 8   Period Weeks   Status Achieved   PT LONG TERM GOAL #7   Title report < or = to 3/10 Rt knee pain with standing and walking   Time 8   Period Weeks   Status On-going  pain level 4/10               Plan - 12/13/15 1141    Clinical Impression Statement Patient is s/p right partial knee replacement.  Rght  knee ROM is 0-105.  Patient had done too much last week and has increased right knee pain to 5/10.  Patient has decreased mobility of right patella. Patient has increased trigger points in the righ tlower quadriceps.  Patient goes up and down streps with a step to step pattern.  Patient will benefit frmp physical therapy to increase right  knee ROM and strength  to improve function.    Pt will benefit from skilled therapeutic intervention in order to improve on the following deficits Abnormal gait;Decreased strength;Impaired flexibility;Increased edema;Pain;Decreased activity tolerance;Decreased range of motion;Decreased scar mobility;Difficulty walking   Rehab Potential Good   Clinical Impairments Affecting Rehab Potential None   PT Frequency 3x / week   PT Duration 6 weeks   PT Treatment/Interventions ADLs/Self Care Home Management;Cryotherapy;Electrical Stimulation;Therapeutic exercise;Therapeutic  activities;Functional mobility training;Manual techniques;Vasopneumatic Device;Passive range of motion;Scar mobilization;Gait training;Stair training;Patient/family education;Neuromuscular re-education;Ultrasound;Moist Heat   PT Next Visit Plan Rt knee strength, ROM and endurance training.  Edema management.Work on stairs   PT Home Exercise Plan update HEP   Recommended Other Services None   Consulted and Agree with Plan of Care Patient        Problem List Patient Active Problem List   Diagnosis Date Noted  . S/p reverse total shoulder arthroplasty 12/28/2014  . Abnormality of gait 04/25/2014  . Foot pain 06/03/2011  . Metatarsalgia 06/03/2011  . CHRONIC PAIN SYNDROME 12/05/2009  . INSOMNIA 08/15/2009  . INGROWN TOENAIL 06/06/2009  . LEG PAIN, BILATERAL 06/05/2009  . OTHER VITAMIN B12 DEFICIENCY ANEMIA 04/04/2009  . MALAISE AND FATIGUE 02/28/2009  . UNSPECIFIED HYPOTHYROIDISM 07/26/2008  . HYPERLIPIDEMIA 11/05/2007  . NEUROPATHY, IDIOPATHIC PERIPHERAL NEC 09/16/2007  . HYPERTENSION 01/28/2007  . OSTEOARTHRITIS 01/28/2007  . ROTATOR CUFF INJURY, RIGHT SHOULDER 01/28/2007  . OSTEOPENIA 01/28/2007   Earlie Counts, PT 12/13/2015 11:48 AM   [facility] [address] [place], [place], [address] Phone: [phone]   Fax:  [phone]  Name: Destiny [name] MRN: [id number] Date of Birth: 05/19/1941    PHYSICAL THERAPY DISCHARGE SUMMARY  Visits from Start of Care: 15 Current functional level related to goals / functional outcomes: See above.  Remaining deficits: See above.  Patient called to cancel her appointment due to her knee feeling worse from therapy and wants to be discharged.    Education / Equipment: HEP  Plan: Patient agrees to discharge.  Patient goals were partially met. Patient is being discharged due to the patient's request.  Thank you for the referral. Earlie Counts, PT 12/17/2015 3:53  PM  ?????

## 2015-12-19 ENCOUNTER — Ambulatory Visit: Payer: Medicare Other | Admitting: Physical Therapy

## 2015-12-25 ENCOUNTER — Encounter: Payer: Medicare Other | Admitting: Physical Therapy

## 2016-01-01 ENCOUNTER — Encounter: Payer: Medicare Other | Admitting: Physical Therapy

## 2016-01-08 ENCOUNTER — Encounter: Payer: Medicare Other | Admitting: Physical Therapy

## 2016-01-15 ENCOUNTER — Encounter: Payer: Medicare Other | Admitting: Physical Therapy

## 2016-07-16 ENCOUNTER — Ambulatory Visit (INDEPENDENT_AMBULATORY_CARE_PROVIDER_SITE_OTHER): Payer: Medicare Other | Admitting: Sports Medicine

## 2016-07-16 ENCOUNTER — Encounter: Payer: Self-pay | Admitting: Sports Medicine

## 2016-07-16 VITALS — BP 140/60 | HR 58 | Ht 62.0 in | Wt 148.0 lb

## 2016-07-16 DIAGNOSIS — M25561 Pain in right knee: Secondary | ICD-10-CM | POA: Diagnosis not present

## 2016-07-16 DIAGNOSIS — M79672 Pain in left foot: Secondary | ICD-10-CM | POA: Diagnosis not present

## 2016-07-16 MED ORDER — NITROGLYCERIN 0.2 MG/HR TD PT24: MEDICATED_PATCH | TRANSDERMAL | 1 refills | Status: DC

## 2016-07-16 NOTE — Patient Instructions (Signed)

## 2016-07-17 DIAGNOSIS — M25561 Pain in right knee: Secondary | ICD-10-CM | POA: Insufficient documentation

## 2016-07-17 NOTE — Assessment & Plan Note (Signed)
Most likely related to spur on the patella that is cutting into the patellar tendon.  - initiate nitro therapy and she will f/u in 6 weeks  - she can continue to wear her husband's thigh body helix on her knee.

## 2016-07-17 NOTE — Progress Notes (Signed)
Destiny [name] - 75 y.o. female MRN [id number]  Date of birth: Nov 26, 1940  SUBJECTIVE:  Including CC & ROS.  Chief Complaint  Patient presents with  . Foot Pain  . Knee Pain   Destiny [name] is a 75 yo F that is presenting with left foot pain and right knee pain.She has a long history of left foot pain. It started about 10 years ago when sure that posterior tibial tendon ruptured. She had surgery performed at [facility] but was able to salvage her posterior tib tendon. She noticed that her pain was worse when she was going up stairs at her condo. She has presented today for a second opinion because they were thinking about performing another surgery on her foot. She has been wearing a air cast with some improvement of the pain. She has had orthotics made 2 years ago. The pain started since July of this year. She has also been wearing an ACE wrap on her left ankle which has helped.   The right knee pain is fairly acute. His been since June. It is anterior in nature. He right knee had a unilateral placed last fall and finished PT in February of this year.  The pain has been present since she was walking up and down the stairs at her condo. The pain seems to be getting worse. She had x-rays performed at [facility] in June and reported to be normal.   ROS: No unexpected weight loss, fever, chills, instability, redness, otherwise see HPI    HISTORY: Past Medical, Surgical, Social, and Family History Reviewed & Updated per EMR.   Pertinent Historical Findings include: PMSHx -  Right Total reverse shoulder, Medial uni on left and right, left PTT repair about 10 years ago, osteopenia,  PSHx -  Retired, No tobacco use, occasional EtOH  Fhx: pancreatic cancer, OA, HTN  Medications - vicodin, Calcium and vitamin D   DATA REVIEWED: None to review   PHYSICAL EXAM:  VS: BP:140/60  HR:(!) 58bpm  TEMP: ( )  RESP:   HT:5\' 2"  (157.5 cm)   WT:148 lb (67.1 kg)  BMI:27.1 PHYSICAL EXAM: Gen: NAD, alert, cooperative  with exam, Elderly female  HEENT: clear conjunctiva, EOMI CV:  Normal pulses, capillary refill brisk,  Resp: non-labored, normal speech Skin: no rashes, normal turgor  Neuro: no gross deficits.  Psych:  alert and oriented Knee: Healed vertical scar on the medial aspect of her knee.  TTP on the lateral aspect of the patellar tendon at the origin  Normal strength  Normal varus and valgus stress testing  Negative McMurray's  Left foot/Ankle:  Tender to palpation over the navicular bone. Limited range of motion in plantarflexion and dorsiflexion. Limited range of motion and eversion and inversion. Normal sensation and pulses. Well-healed scars observed. Loss of transverse arch. Overriding fourth toe onto the third 2.  Limited US: right knee: Quad tendon found to be normal. Mild effusion in the suprapatellar pouch. Hypoechoic change on the MP or aspect of the patellar tendon suggestive of spur cutting into the patellar tendon.  Limited US:  left ankle/foot:  posterior tibial tendon was normal prior to entering the tarsal tunnel. Once upon insertion to the navicular is hypoechoic change that would not suggest normal anatomy. This may be partial fraying of the insertion of the posterior tibial tendon.  ASSESSMENT & PLAN:   I spent 30 minutes with this patient, greater than 50% was face-to-face time counseling regarding the below diagnosis.   Right knee pain Most  likely related to spur on the patella that is cutting into the patellar tendon.  - initiate nitro therapy and she will f/u in 6 weeks  - she can continue to wear her husband's thigh body helix on her knee.    Foot pain History of several surgeries which has affected her anatomy. Her PTT insertion into the navicular looks to be the source of her pain.  - initiate nitro therapy over this area  - placed scaphoid pad on the left  - if these adjustments to her orthotics help then she will follow up in one month for custom  orthotics.  - placed body helix on the left ankle

## 2016-07-17 NOTE — Assessment & Plan Note (Signed)
History of several surgeries which has affected her anatomy. Her PTT insertion into the navicular looks to be the source of her pain.  - initiate nitro therapy over this area  - placed scaphoid pad on the left  - if these adjustments to her orthotics help then she will follow up in one month for custom orthotics.  - placed body helix on the left ankle

## 2016-08-20 ENCOUNTER — Ambulatory Visit (INDEPENDENT_AMBULATORY_CARE_PROVIDER_SITE_OTHER): Payer: Medicare Other | Admitting: Sports Medicine

## 2016-08-20 ENCOUNTER — Encounter: Payer: Self-pay | Admitting: Sports Medicine

## 2016-08-20 DIAGNOSIS — M25579 Pain in unspecified ankle and joints of unspecified foot: Secondary | ICD-10-CM

## 2016-08-20 DIAGNOSIS — M25561 Pain in right knee: Secondary | ICD-10-CM | POA: Diagnosis not present

## 2016-08-20 DIAGNOSIS — G5750 Tarsal tunnel syndrome, unspecified lower limb: Secondary | ICD-10-CM

## 2016-08-20 NOTE — Assessment & Plan Note (Signed)
Improved with compression and she will cont this  Restart on exercise program  She is to see Dr Eden Lathe next month to assess status

## 2016-08-20 NOTE — Progress Notes (Signed)
CC. RT knee and left ankle pain  Patient returns for followup Uni-Compartmental knee replacement on the right was doing well until mid summer After walking steps she went up having a lot more pain and swelling Her last visit we found warmth and swelling of the right knee We found swelling of the posterior tibial tendon and ankle on the left as well as the sinus tarsi bilaterally  Compression helpful to RT knee and left ankle since that visit Nitroglycerin patch to patellar tendon and to the posterior tib tendon insertion at navicular both seem to help pain No side effects from this  She is on chronic gabapentin which was recently changed at another office Has increased gabapentin to 300 tid and 600 hs  Social history Retired Human resources officer Social alcohol  Review of systems Less sciatic irritation since the increasing gabapentin Less swelling of the left ankle Pain has decreased by at least 50% above the right knee and left ankle  Physical examination White female in no acute distress BP 122/71   Pulse 71   Ht 5\' 2"  (1.575 m)   Wt 148 lb (67.1 kg)   BMI 27.07 kg/m    bilat sinus swelling Ankle ligs stable Less swelling on left post tib  RT knee warm Less swelling bilat ant scars on knees from [facility] Good flex and ext and nice stability

## 2016-08-20 NOTE — Assessment & Plan Note (Signed)
Try compressoin on RT ankle since helpful on left Easy motion exercises Topical meds and massage to swollen area Cont orthotics  Reck 2 mos

## 2016-08-20 NOTE — Patient Instructions (Signed)
Let's add a few exercises Chair Hip flexion Knee extension Standing hip lateral abduction  Try to start stationary bike but only at 5 minutes When comfortable gradually build up time  Compression seems to help  Consider getting one for right ankle  NTG may help so if no problems keep trying with that  Massage sinus tarsi (swelling at ankle) and try topical cream  Recheck in 2 months

## 2016-09-10 ENCOUNTER — Encounter: Payer: Self-pay | Admitting: Gastroenterology

## 2016-10-08 ENCOUNTER — Ambulatory Visit (INDEPENDENT_AMBULATORY_CARE_PROVIDER_SITE_OTHER): Payer: Medicare Other | Admitting: Sports Medicine

## 2016-10-08 ENCOUNTER — Other Ambulatory Visit: Payer: Self-pay | Admitting: *Deleted

## 2016-10-08 ENCOUNTER — Encounter: Payer: Self-pay | Admitting: Sports Medicine

## 2016-10-08 ENCOUNTER — Ambulatory Visit: Payer: Self-pay

## 2016-10-08 VITALS — BP 135/67 | HR 78 | Ht 62.0 in | Wt 148.0 lb

## 2016-10-08 DIAGNOSIS — M79644 Pain in right finger(s): Principal | ICD-10-CM

## 2016-10-08 DIAGNOSIS — G8929 Other chronic pain: Secondary | ICD-10-CM

## 2016-10-08 DIAGNOSIS — M19042 Primary osteoarthritis, left hand: Secondary | ICD-10-CM | POA: Diagnosis not present

## 2016-10-08 DIAGNOSIS — M19041 Primary osteoarthritis, right hand: Secondary | ICD-10-CM | POA: Diagnosis not present

## 2016-10-08 MED ORDER — TRIAMCINOLONE ACETONIDE 10 MG/ML IJ SUSP
5.0000 mg | Freq: Once | INTRAMUSCULAR | Status: AC
  Administered 2016-10-08: 5 mg via INTRA_ARTICULAR

## 2016-10-08 NOTE — Progress Notes (Signed)
Chief complaint--right [facility] joint pain  Patient has known severe arthritis of both hands. CMC joint has been injected by Dr. Daylene Katayama in the past. RT She has been evaluated by rheumatology in seems to have an aggressive inflammatory osteoarthritis Changes in the hand would be consistent with rheumatoid arthritis but testing has been negative. She uses a mix of Tylenol, ibuprofen and on occasions Vicodin to control arthritis pain. She thinks the pain was made worse by using her hands a lot with some work around the house.  Good news is that her right knee pain is significantly improved. She uses compression and is doing home exercises. For the first time in several years she has been able to go up the steps.  Review of systems Periodic swelling in the PIP joints of both hands Periodic swelling of both wrists. Generalized arthritic pain in multiple joints Sinus tarsi symptoms improved with compression  Physical examination Pleasant older female in no acute distress BP 135/67   Pulse 78   Ht 5\' 2"  (1.575 m)   Wt 148 lb (67.1 kg)   BMI 27.07 kg/m   Bilateral hands show multiple changes of arthritis She has ulnar drift of both hands Swelling and enlargement of the MCP joints right greater than left Swelling of PIP joints of both agents Right CMC joint is tender and shows moderate swelling today/ squaring Left CMC joint shows degenerative change but is not tender  Ultrasound of right CMC joint The right CMC joint was visualized using a hockey stick probe There is a significant effusion over the joint There is significant joint space narrowing There are calcifications Mild spurring  Summary; ultrasound findings are consistent with advanced right CMC joint  Arthritis with an active effusion  Ultrasound and interpretation by Stefanie Libel M.D.  Procedure:  Injection of Right CMC joint Consent obtained and verified. Time-out conducted. Noted no overlying erythema, induration, or  other signs of local infection. Skin prepped in a sterile fashion. We used ultrasound to identify the hypoechoic effusion over the [facility] joint. With ultrasound guidance we directed the injection into the effusion. Completed without difficulty. Steroid visualized in the joint space Meds: 1/2 mL of Kenalog 10-1/2 mL of lidocaine 1% Pain immediately improved suggesting accurate placement of the medication. Advised to call if fevers/chills, erythema, induration, drainage, or persistent bleeding.

## 2016-10-08 NOTE — Assessment & Plan Note (Signed)
Corticosteroid injection today Right CMC joint  Continue other medications without change  Use warm water baths 2-3 times a week to lessen hand stiffness  Recheck as needed

## 2017-01-13 ENCOUNTER — Ambulatory Visit (INDEPENDENT_AMBULATORY_CARE_PROVIDER_SITE_OTHER): Payer: Medicare Other | Admitting: Sports Medicine

## 2017-01-13 ENCOUNTER — Encounter: Payer: Self-pay | Admitting: Sports Medicine

## 2017-01-13 VITALS — BP 139/67 | HR 70 | Ht 62.0 in | Wt 148.0 lb

## 2017-01-13 DIAGNOSIS — M25579 Pain in unspecified ankle and joints of unspecified foot: Secondary | ICD-10-CM | POA: Diagnosis not present

## 2017-01-13 DIAGNOSIS — G8929 Other chronic pain: Secondary | ICD-10-CM | POA: Diagnosis not present

## 2017-01-13 DIAGNOSIS — M25561 Pain in right knee: Secondary | ICD-10-CM

## 2017-01-13 MED ORDER — METHYLPREDNISOLONE ACETATE 40 MG/ML IJ SUSP
40.0000 mg | Freq: Once | INTRAMUSCULAR | Status: AC
  Administered 2017-01-13: 40 mg via INTRA_ARTICULAR

## 2017-01-13 NOTE — Assessment & Plan Note (Signed)
  Still with pain and effusion Now 14 mos s/p Med compartment PKR  CSI today see vsit note

## 2017-01-13 NOTE — Progress Notes (Signed)
  Destiny [name] - 76 y.o. female MRN [id number]  Date of birth: 1941-11-19  SUBJECTIVE:   CC: Rt Knee Pain  HPI: Patient her to follow-up on her right knee pain. She states she had a partial knee replacement a little over a year ago (10/19/15). Since then she is starting to have knee pain again.She is wondering if she could get an injection. She has received a steroid injection before from Dr. Eden Lathe in Nov. 2017 with good relief. She localizes pain to the anterior aspect fp her knee around the patellar tendon but states it refers pain to the back of her knee. Knee tends to swell and ache. She intermittently wears a knee sleeve.   ROS per HPI. In addition no loss of sensation, fevers, falls.   HISTORY: Past Medical, Surgical, Social, and Family History Reviewed & Updated per EMR.   Pertinent Historical Findings include: S/p Rt and Lt MAKOplasty medial    DATA REVIEWED: Imaging Results:  Study: Right Knee XR Date: 10/21/2016 Indication: MAKOplasty Findings: Prosthesis is in place and well-aligned. Lateral and patellofemoral joint spaces are well-maintained. 1 degree of varus. Inflammation adjacent to the patella tendon.   PHYSICAL EXAM:  BP 139/67   Pulse 70   Ht 5\' 2"  (1.575 m)   Wt 148 lb (67.1 kg)   BMI 27.07 kg/m   General: alert, well-developed, NAD, cooperative Msk:  Bilateral hands with ulnar deviation with MCP joint swelling; thenar atrophy Bilateral ankle swelling  R. Knee: Inspection: Bilateral knee scars, right knee effusion, no erythema or warmth ROM good in flexion and extension Good knee stability No tenderness to palpation   ASSESSMENT & PLAN: See problem based charting & AVS for pt instructions.  1. Chronic pain of right knee S/p bilateral MAKOplasty medial. Continues to have pain and effusion to knee. Could be due to reaction from hardware from knee or just DJD swelling from lateral compartment. Pain improved after last knee injection. Given suprapatellar  knee injection today. Continue intermittent knee sleeve. Has some associated. Follow-up with orthopedic doctor as scheduled.   2. Sinus tarsi syndrome, unspecified laterality Bilateral with significant swelling. Without pain. Patient to continue compression sleeve on ankles. Continue use of orthotics. Continue home pain medications as prescribed.   Follow-up 2 months  Injection RT Knee Meds: 1% lidocaine 4 cc and 1 cc Depomedrol 40 Prep with alcohol Local Anesthesia with Ethyl Cl spray  An ultrasound guided right suprapatellar steroid injection was performed at the right lateral knee using 1% plain Lidocaine and 40 mg of Depo-Medrol. This was well tolerated. The Injection was directed into suprapatellar pouch which showed a moderate effusion from a lateral approach.  Patient tolerated procedure well and had some immediate pain relief.  Warned of possible complications and will contact us with any issues.  Luiz Blare, DO 01/13/2017, 3:07 PM PGY-3, [facility]  I observed and examined the patient with the resident and agree with assessment and plan.  Note reviewed and modified by me. Stefanie Libel, MD

## 2017-02-10 ENCOUNTER — Ambulatory Visit: Payer: Medicare Other | Admitting: Sports Medicine

## 2017-04-30 ENCOUNTER — Encounter: Payer: Self-pay | Admitting: Sports Medicine

## 2017-04-30 ENCOUNTER — Ambulatory Visit (INDEPENDENT_AMBULATORY_CARE_PROVIDER_SITE_OTHER): Payer: Medicare Other | Admitting: Sports Medicine

## 2017-04-30 VITALS — BP 142/70 | Ht 62.0 in | Wt 148.0 lb

## 2017-04-30 DIAGNOSIS — M25561 Pain in right knee: Secondary | ICD-10-CM

## 2017-04-30 DIAGNOSIS — G8929 Other chronic pain: Secondary | ICD-10-CM | POA: Diagnosis not present

## 2017-04-30 MED ORDER — METHYLPREDNISOLONE ACETATE 40 MG/ML IJ SUSP
40.0000 mg | Freq: Once | INTRAMUSCULAR | Status: AC
  Administered 2017-04-30: 40 mg via INTRA_ARTICULAR

## 2017-04-30 NOTE — Progress Notes (Signed)
  Destiny [name] - 76 y.o. female MRN [id number]  Date of birth: 1941-01-16  SUBJECTIVE:  Including CC & ROS.  CC: right knee pain  Presents with knee pain for the past couple weeks.  Her injection from February had done very well and she would like another one.  She has noted swelling and it wakes her up at night.  It is not as bad as February.  Bothers her the most when she is walking up and down the stairs. This is especially bothersome [place].   ROS: No unexpected weight loss, fever, chills, instability, muscle pain, numbness/tingling, redness, otherwise see HPI, +swelling  PMHx - Updated and reviewed.  Contributory factors include: Hypertension, hypothyroidism PSHx - Updated and reviewed.  Contributory factors include:   bilateral partial knee replacement on the medial compartment FHx - Updated and reviewed.  Contributory factors include:  Negative Social Hx - Updated and reviewed. Contributory factors include: Negative Medications - reviewed   DATA REVIEWED: Previous office visits and partial knee replacement  PHYSICAL EXAM:  VS: BP:(!) 142/70  HR: bpm  TEMP: ( )  RESP:   HT:5\' 2"  (157.5 cm)   WT:148 lb (67.1 kg)  BMI:27.1 PHYSICAL EXAM: Gen: NAD, alert, cooperative with exam, well-appearing HEENT: clear conjunctiva,  CV:  no edema, capillary refill brisk, normal rate Resp: non-labored Skin: no rashes, normal turgor  Neuro: no gross deficits.  Psych:  alert and oriented  Knee: Inspection with no erythema, 1+ effusion present on right. Palpation with no warmth, joint line tenderness, patellar tenderness, or condyle tenderness.   ROM full in extension and lower leg rotation. Flexion to 115 degrees bilaterally Ligaments with solid consistent endpoints including ACL, PCL, LCL, MCL. Negative Mcmurray's, Apley's, and Thessalonian tests. Non painful patellar compression. Patellar and quadriceps tendons unremarkable. Hamstring and quadriceps  strength is normal.   Limited ultrasound right knee:  Suprapatellar pouch with small effusion present Quad tendon intact with good fibrillary pattern Medial joint line with no spurring or hypoechoic changes present Lateral joint line with no increased spurring or hypo-or hyperechoic changes. Meniscus is without tearing  Summary: Findings consistent with joint effusion  Ultrasound performed and interpreted by Melton Krebs, DO   ASSESSMENT & PLAN:   Right knee pain Right knee pain s/p partial knee replacement.  Reviewed risks and benefits of injection, especially with a history of hardware in knee.  She wished to proceed.  Follow up as needed for injections.  Procedure:  Injection of right knee under ultrasound guidance Consent obtained and verified. Time-out conducted. Noted no overlying erythema, induration, or other signs of local infection. Skin prepped in a sterile fashion. Topical analgesic spray: Ethyl chloride. Completed without difficulty. Meds: 40 mg depomedrol, 3 cc 1% lidocaine Pain immediately improved suggesting accurate placement of the medication. Advised to call if fevers/chills, erythema, induration, drainage, or persistent bleeding.

## 2017-04-30 NOTE — Assessment & Plan Note (Signed)
Right knee pain s/p partial knee replacement.  Reviewed risks and benefits of injection, especially with a history of hardware in knee.  She wished to proceed.  Follow up as needed for injections.

## 2017-04-30 NOTE — Patient Instructions (Signed)
[facility] Dr [address]ian Situ or [address], [place], [place] [address] Phone: [phone]

## 2017-08-24 ENCOUNTER — Other Ambulatory Visit: Payer: Self-pay | Admitting: Internal Medicine

## 2017-08-24 DIAGNOSIS — Z1231 Encounter for screening mammogram for malignant neoplasm of breast: Secondary | ICD-10-CM

## 2017-09-01 ENCOUNTER — Ambulatory Visit
Admission: RE | Admit: 2017-09-01 | Discharge: 2017-09-01 | Disposition: A | Discharge status: Home / Self Care | Payer: Medicare Other | Source: Ambulatory Visit | Attending: Internal Medicine | Admitting: Internal Medicine

## 2017-09-01 DIAGNOSIS — Z1231 Encounter for screening mammogram for malignant neoplasm of breast: Secondary | ICD-10-CM

## 2017-09-17 ENCOUNTER — Ambulatory Visit (INDEPENDENT_AMBULATORY_CARE_PROVIDER_SITE_OTHER): Payer: Medicare Other | Admitting: Sports Medicine

## 2017-09-17 DIAGNOSIS — M19042 Primary osteoarthritis, left hand: Secondary | ICD-10-CM | POA: Diagnosis not present

## 2017-09-17 DIAGNOSIS — M19041 Primary osteoarthritis, right hand: Secondary | ICD-10-CM | POA: Diagnosis not present

## 2017-09-17 NOTE — Assessment & Plan Note (Signed)
With significant hand arthritis I referred her to Dr. Grandville Silos  She may need a fusion or something done with the right third PIP as this is really limiting her lifestyle  Trial with some intermittent cryotherapy to see if this might help  Trial of arnica-gel  Return as needed

## 2017-09-17 NOTE — Addendum Note (Signed)
Addended by: Jolinda Croak E on: 09/17/2017 01:25 PM   Modules accepted: Orders

## 2017-09-17 NOTE — Patient Instructions (Signed)
Dr[facility] [address] [place] [phone]  Intermittent cryo-therapy  Arnica gel

## 2017-09-17 NOTE — Progress Notes (Signed)
Chief complaint; bilateral hand pain worse on the right  Patient has chronic pain in both hands now quite severe over the right third finger at the PIP joint She has had consistent swelling in her PIP joints and MCP joints on the right She occasionally gets swelling at the PIP joints and MCP joints on the left The right hand feels hot at times and is painful  X-rays showed erosion and very significant arthritis She was evaluated at [facility] with a full workup for rheumatoid or other collagen vascular disease All testing to date has been normal However because of her severe symptoms she was treated with Plaquenil, methotrexate, enabrel and periodic prednisone She has had injections as well None of these has given her significant pain relief  Currently she is treated a pain clinic with 2 hydrocodone a day She takes occasional Tylenol or ibuprofen  Past history She has had arthritis in multiple locations Significant knee arthritis including a partial knee replacement Chronic foot pain  Review of systems Swelling in the hands worst in the mornings Some relief with heat and with a paraffin bath No redness or rashes  Physical exam Older female in no acute distress BP 130/70   Ht 5\' 1"  (1.549 m)   Wt 148 lb (67.1 kg)   BMI 27.96 kg/m   She has a scar from earlier lumbar fusion and some lumbar scoliosis Sitting on the table she gets quadriceps cramps These resolved when she sits in a chair  Swelling over the MCP joints 1 and 2 bilaterally Warmth over the MCP joints on the right Warmth over PIP joints to 3 and 4 on the right Deformity and significant swelling of PIP joints particularly the third Left shows abnormality of the PIP joints but they are not warm  Review of x-rays shows extensive arthritis

## 2017-09-18 ENCOUNTER — Encounter: Payer: Self-pay | Admitting: Sports Medicine

## 2017-09-30 ENCOUNTER — Other Ambulatory Visit: Payer: Self-pay | Admitting: Orthopedic Surgery

## 2017-10-01 ENCOUNTER — Other Ambulatory Visit: Payer: Self-pay

## 2017-10-01 ENCOUNTER — Encounter (HOSPITAL_BASED_OUTPATIENT_CLINIC_OR_DEPARTMENT_OTHER): Payer: Self-pay | Admitting: *Deleted

## 2017-10-01 NOTE — H&P (Signed)
Destiny [name] is an 76 y.o. female.   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  Over the course of many years she has developed pain and deformity of the left hand associated with inflammatory erosive arthritis.  Although multiple joints are affected, the worst is the left long finger PIP joint followed by the ring finger PIP joint.  Her long finger has become so symptomatic more recently that she generally just avoids integrating it into her function.  It is sore when bumped, etc.  She has tried multiple nonoperative strategies for management, to include narcotic pain medicines, cortisone injections, hot wax, topicals, oral NSAIDs and Tylenol, etc.  Past Medical History:  Diagnosis Date  . Anxiety   . Arthritis   . Colon polyps   . Complication of anesthesia    BP drops with fentyl and versed  . Hepatitis    hep A  1976  . Hypertension   . Hypothyroidism   . PONV (postoperative nausea and vomiting)     Past Surgical History:  Procedure Laterality Date  . ABDOMINAL HYSTERECTOMY    . APPENDECTOMY    . BACK SURGERY      fusion  . CESAREAN SECTION     times 2  . EYE SURGERY Left    cataract removed  . FOOT SURGERY Bilateral   . FRACTURE SURGERY     broken ankle  . MEDIAL PARTIAL KNEE REPLACEMENT Left    done at [place] Right 12/28/2014   DR CHANDLER  . rotator cufff    . THYROIDECTOMY, PARTIAL     one lobe removed  . VARICOSE VEIN SURGERY Right     History reviewed. No pertinent family history. Social History:  reports that  has never smoked. she has never used smokeless tobacco. She reports that she drinks alcohol. She reports that she does not use drugs.  Allergies:  Allergies  Allergen Reactions  . Nsaids Other (See Comments)     BRUISES EASILY  . Aspirin Other (See Comments)    Bruising   .  Ciprocin-Fluocin-Procin [Fluocinolone]   . Ciprofloxacin Hcl     tendonitis  . Fentanyl Other (See Comments)    Does not work  . Lyrica [Pregabalin] Other (See Comments)    dysphoria  . Oxycodone Other (See Comments)    Does not work  . Versed [Midazolam] Other (See Comments)    Does not work  . Hydromorphone Hcl Itching     Itching with large doses - able to tolerate lower doses or shorter duration  . Voltaren Gel [Diclofenac Sodium] Rash and Other (See Comments)    Skin peeling.    No medications prior to admission.    No results found for this or any previous visit (from the past 48 hour(s)). No results found.  Review of Systems  All other systems reviewed and are negative.   Height 5\' 1"  (1.549 m), weight 67.1 kg (148 lb). Physical Exam  Constitutional:  WD, WN, NAD HEENT:  NCAT, EOMI Neuro/Psych:  Alert & oriented to person, place, and time; appropriate mood & affect Lymphatic: No generalized UE edema or lymphadenopathy Extremities / MSK:  Both UE are normal with respect to appearance, ranges of motion, joint stability, muscle strength/tone, sensation, & perfusion except as otherwise noted:  The left long finger PIP joint is noted to be  enlarged and rests about 12 ulnarly deviated.  His range is just 20-60.  The ring finger has a range from 20-100, with no malalignment in the coronal plane.  Each is tender with palpation, more so the long finger.  In addition, although the long finger endpoint of motion is springy, it elicits exquisite pain attempting to passively flex greater than 60.  Flexor and extensor tendons appear to be intact.  Labs / Xrays:  3 views of the left hand ordered and obtained today reveals erosive osteoarthritic change throughout multiple joints, to include diffusely across the carpals, the TMC joint, MCP joints, and PIP joints, perhaps most marked at the long finger PIP joint and ring finger PIP joints, where "cupping" is evident, as well as coronal  plane malalignment in the long finger PIP joint  Assessment: Erosive osteoarthritic change in the left hand, with the greatest pain, deformity, and stiffness of the long finger PIP joint, followed by the ring finger.  Plan:  I discussed these findings with her multiple ways in which to proceed.  We discussed the pros and cons of joint implant arthroplasty versus fusion, and I recommended arthrodesis for the long finger PIP joint as my preferred recommended option for her.  After careful consideration deliberation, she indicated she would like to proceed with this and we will plan it according to a schedule that suits her best.  We did discuss postoperative implications, including the need for hand therapy, custom splinting, etc.  We will defer additional treatment for the ring finger PIP joint as it is presently not very painful and focus on just the long finger at this time.  The details of the operative procedure were discussed with the patient.  Questions were invited and answered.  In addition to the goal of the procedure, the risks of the procedure to include but not limited to bleeding; infection; damage to the nerves or blood vessels that could result in bleeding, numbness, weakness, chronic pain, and the need for additional procedures; stiffness; the need for revision surgery; and anesthetic risks were reviewed.  No specific outcome was guaranteed or implied.  Informed consent was obtained.  Jules Vidovich A., MD 10/01/2017, 2:39 PM

## 2017-10-02 ENCOUNTER — Encounter (HOSPITAL_BASED_OUTPATIENT_CLINIC_OR_DEPARTMENT_OTHER)
Admission: RE | Disposition: A | Discharge status: Home / Self Care | Payer: Self-pay | Source: Ambulatory Visit | Attending: Orthopedic Surgery

## 2017-10-02 ENCOUNTER — Ambulatory Visit (HOSPITAL_BASED_OUTPATIENT_CLINIC_OR_DEPARTMENT_OTHER)
Admission: RE | Admit: 2017-10-02 | Discharge: 2017-10-02 | Disposition: A | Discharge status: Home / Self Care | Payer: Medicare Other | Source: Ambulatory Visit | Attending: Orthopedic Surgery | Admitting: Orthopedic Surgery

## 2017-10-02 ENCOUNTER — Ambulatory Visit (HOSPITAL_BASED_OUTPATIENT_CLINIC_OR_DEPARTMENT_OTHER): Payer: Medicare Other | Admitting: Anesthesiology

## 2017-10-02 ENCOUNTER — Other Ambulatory Visit: Payer: Self-pay

## 2017-10-02 ENCOUNTER — Ambulatory Visit (HOSPITAL_COMMUNITY): Payer: Medicare Other

## 2017-10-02 ENCOUNTER — Encounter (HOSPITAL_BASED_OUTPATIENT_CLINIC_OR_DEPARTMENT_OTHER): Payer: Self-pay | Admitting: *Deleted

## 2017-10-02 DIAGNOSIS — Z96611 Presence of right artificial shoulder joint: Secondary | ICD-10-CM | POA: Diagnosis not present

## 2017-10-02 DIAGNOSIS — Z96652 Presence of left artificial knee joint: Secondary | ICD-10-CM | POA: Insufficient documentation

## 2017-10-02 DIAGNOSIS — F419 Anxiety disorder, unspecified: Secondary | ICD-10-CM | POA: Insufficient documentation

## 2017-10-02 DIAGNOSIS — M154 Erosive (osteo)arthritis: Secondary | ICD-10-CM | POA: Diagnosis present

## 2017-10-02 DIAGNOSIS — M19042 Primary osteoarthritis, left hand: Secondary | ICD-10-CM | POA: Diagnosis not present

## 2017-10-02 DIAGNOSIS — E039 Hypothyroidism, unspecified: Secondary | ICD-10-CM | POA: Insufficient documentation

## 2017-10-02 DIAGNOSIS — M25742 Osteophyte, left hand: Secondary | ICD-10-CM | POA: Diagnosis not present

## 2017-10-02 DIAGNOSIS — I10 Essential (primary) hypertension: Secondary | ICD-10-CM | POA: Diagnosis not present

## 2017-10-02 DIAGNOSIS — Z79899 Other long term (current) drug therapy: Secondary | ICD-10-CM | POA: Diagnosis not present

## 2017-10-02 DIAGNOSIS — Z419 Encounter for procedure for purposes other than remedying health state, unspecified: Secondary | ICD-10-CM

## 2017-10-02 HISTORY — PX: FINGER ARTHRODESIS: SHX5000

## 2017-10-02 SURGERY — FUSION, JOINT, FINGER
Anesthesia: General | Laterality: Left

## 2017-10-02 MED ORDER — LIDOCAINE HCL (CARDIAC) 20 MG/ML IV SOLN
INTRAVENOUS | Status: DC | PRN
  Administered 2017-10-02: 80 mg via INTRAVENOUS

## 2017-10-02 MED ORDER — GLYCOPYRROLATE 0.2 MG/ML IJ SOLN
INTRAMUSCULAR | Status: DC | PRN
  Administered 2017-10-02: 0.2 mg via INTRAVENOUS

## 2017-10-02 MED ORDER — PROPOFOL 10 MG/ML IV BOLUS
INTRAVENOUS | Status: DC | PRN
  Administered 2017-10-02: 150 mg via INTRAVENOUS

## 2017-10-02 MED ORDER — MIDAZOLAM HCL 2 MG/2ML IJ SOLN
INTRAMUSCULAR | Status: AC
  Filled 2017-10-02: qty 2

## 2017-10-02 MED ORDER — BUPIVACAINE-EPINEPHRINE (PF) 0.25% -1:200000 IJ SOLN
INTRAMUSCULAR | Status: AC
  Filled 2017-10-02: qty 60

## 2017-10-02 MED ORDER — LACTATED RINGERS IV SOLN
INTRAVENOUS | Status: DC
  Administered 2017-10-02: 12:00:00 via INTRAVENOUS

## 2017-10-02 MED ORDER — LACTATED RINGERS IV SOLN: INTRAVENOUS | Status: DC

## 2017-10-02 MED ORDER — FENTANYL CITRATE (PF) 100 MCG/2ML IJ SOLN
INTRAMUSCULAR | Status: AC
  Filled 2017-10-02: qty 2

## 2017-10-02 MED ORDER — DEXAMETHASONE SODIUM PHOSPHATE 10 MG/ML IJ SOLN
INTRAMUSCULAR | Status: AC
  Filled 2017-10-02: qty 1

## 2017-10-02 MED ORDER — PHENYLEPHRINE HCL 10 MG/ML IJ SOLN
INTRAMUSCULAR | Status: DC | PRN
  Administered 2017-10-02: 80 ug via INTRAVENOUS
  Administered 2017-10-02: 40 ug via INTRAVENOUS
  Administered 2017-10-02: 120 ug via INTRAVENOUS
  Administered 2017-10-02: 40 ug via INTRAVENOUS

## 2017-10-02 MED ORDER — EPHEDRINE SULFATE 50 MG/ML IJ SOLN
INTRAMUSCULAR | Status: DC | PRN
  Administered 2017-10-02: 10 mg via INTRAVENOUS
  Administered 2017-10-02: 15 mg via INTRAVENOUS
  Administered 2017-10-02 (×3): 10 mg via INTRAVENOUS

## 2017-10-02 MED ORDER — FENTANYL CITRATE (PF) 100 MCG/2ML IJ SOLN
INTRAMUSCULAR | Status: DC | PRN
  Administered 2017-10-02 (×2): 50 ug via INTRAVENOUS

## 2017-10-02 MED ORDER — PROPOFOL 10 MG/ML IV BOLUS
INTRAVENOUS | Status: AC
  Filled 2017-10-02: qty 40

## 2017-10-02 MED ORDER — LIDOCAINE 2% (20 MG/ML) 5 ML SYRINGE
INTRAMUSCULAR | Status: AC
  Filled 2017-10-02: qty 5

## 2017-10-02 MED ORDER — CEFAZOLIN SODIUM-DEXTROSE 2-3 GM-%(50ML) IV SOLR
INTRAVENOUS | Status: DC | PRN
  Administered 2017-10-02: 2 g via INTRAVENOUS

## 2017-10-02 MED ORDER — CEFAZOLIN SODIUM-DEXTROSE 2-4 GM/100ML-% IV SOLN: 2.0000 g | INTRAVENOUS | Status: DC

## 2017-10-02 MED ORDER — CEFAZOLIN SODIUM-DEXTROSE 2-4 GM/100ML-% IV SOLN
INTRAVENOUS | Status: AC
  Filled 2017-10-02: qty 100

## 2017-10-02 MED ORDER — ONDANSETRON HCL 4 MG/2ML IJ SOLN
INTRAMUSCULAR | Status: AC
  Filled 2017-10-02: qty 2

## 2017-10-02 MED ORDER — BUPIVACAINE-EPINEPHRINE (PF) 0.5% -1:200000 IJ SOLN
INTRAMUSCULAR | Status: AC
  Filled 2017-10-02: qty 60

## 2017-10-02 MED ORDER — DEXAMETHASONE SODIUM PHOSPHATE 10 MG/ML IJ SOLN
INTRAMUSCULAR | Status: DC | PRN
  Administered 2017-10-02: 10 mg via INTRAVENOUS

## 2017-10-02 MED ORDER — BUPIVACAINE-EPINEPHRINE (PF) 0.5% -1:200000 IJ SOLN
INTRAMUSCULAR | Status: DC | PRN
  Administered 2017-10-02: 9 mL via PERINEURAL

## 2017-10-02 MED ORDER — SCOPOLAMINE 1 MG/3DAYS TD PT72: 1.0000 | MEDICATED_PATCH | Freq: Once | TRANSDERMAL | Status: DC | PRN

## 2017-10-02 MED ORDER — ONDANSETRON HCL 4 MG/2ML IJ SOLN
INTRAMUSCULAR | Status: DC | PRN
  Administered 2017-10-02: 4 mg via INTRAVENOUS

## 2017-10-02 SURGICAL SUPPLY — 56 items
BLADE AVERAGE 25X9 (BLADE) IMPLANT
BLADE MINI RND TIP GREEN BEAV (BLADE) IMPLANT
BLADE SURG 15 STRL LF DISP TIS (BLADE) ×1 IMPLANT
BLADE SURG 15 STRL SS (BLADE) ×2
BNDG CMPR 9X4 STRL LF SNTH (GAUZE/BANDAGES/DRESSINGS) ×1
BNDG COHESIVE 1X5 TAN STRL LF (GAUZE/BANDAGES/DRESSINGS) IMPLANT
BNDG COHESIVE 4X5 TAN STRL (GAUZE/BANDAGES/DRESSINGS) ×2 IMPLANT
BNDG CONFORM 2 STRL LF (GAUZE/BANDAGES/DRESSINGS) IMPLANT
BNDG ESMARK 4X9 LF (GAUZE/BANDAGES/DRESSINGS) ×2 IMPLANT
BNDG GAUZE ELAST 4 BULKY (GAUZE/BANDAGES/DRESSINGS) ×2 IMPLANT
CHLORAPREP W/TINT 26ML (MISCELLANEOUS) ×2 IMPLANT
CORD BIPOLAR FORCEPS 12FT (ELECTRODE) ×2 IMPLANT
COVER BACK TABLE 60X90IN (DRAPES) ×2 IMPLANT
COVER MAYO STAND STRL (DRAPES) ×2 IMPLANT
CUFF TOURNIQUET SINGLE 18IN (TOURNIQUET CUFF) ×2 IMPLANT
DRAPE C-ARM 42X72 X-RAY (DRAPES) ×2 IMPLANT
DRAPE EXTREMITY T 121X128X90 (DRAPE) ×2 IMPLANT
DRAPE SURG 17X23 STRL (DRAPES) ×2 IMPLANT
DRSG EMULSION OIL 3X3 NADH (GAUZE/BANDAGES/DRESSINGS) ×2 IMPLANT
ELECT REM PT RETURN 9FT ADLT (ELECTROSURGICAL)
ELECTRODE REM PT RTRN 9FT ADLT (ELECTROSURGICAL) IMPLANT
GAUZE SPONGE 4X4 12PLY STRL LF (GAUZE/BANDAGES/DRESSINGS) ×2 IMPLANT
GLOVE BIO SURGEON STRL SZ7.5 (GLOVE) ×2 IMPLANT
GLOVE BIOGEL PI IND STRL 7.0 (GLOVE) ×2 IMPLANT
GLOVE BIOGEL PI IND STRL 8 (GLOVE) ×1 IMPLANT
GLOVE BIOGEL PI INDICATOR 7.0 (GLOVE) ×2
GLOVE BIOGEL PI INDICATOR 8 (GLOVE) ×1
GLOVE ECLIPSE 6.5 STRL STRAW (GLOVE) ×4 IMPLANT
GOWN STRL REUS W/ TWL LRG LVL3 (GOWN DISPOSABLE) ×2 IMPLANT
GOWN STRL REUS W/TWL LRG LVL3 (GOWN DISPOSABLE) ×4
GOWN STRL REUS W/TWL XL LVL3 (GOWN DISPOSABLE) ×2 IMPLANT
GUIDE WIRE ×2 IMPLANT
LOOP VESSEL MINI RED (MISCELLANEOUS) IMPLANT
NEEDLE HYPO 22GX1.5 SAFETY (NEEDLE) IMPLANT
NS IRRIG 1000ML POUR BTL (IV SOLUTION) ×2 IMPLANT
PACK BASIN DAY SURGERY FS (CUSTOM PROCEDURE TRAY) ×2 IMPLANT
PENCIL BUTTON HOLSTER BLD 10FT (ELECTRODE) IMPLANT
POST PCP MED 30X20 (Screw) ×2 IMPLANT
RASP ×4 IMPLANT
REAMER ×2 IMPLANT
SCREW LAG 3.0X22MM (Screw) ×2 IMPLANT
SPLINT PLASTER CAST XFAST 3X15 (CAST SUPPLIES) ×7 IMPLANT
SPLINT PLASTER XTRA FASTSET 3X (CAST SUPPLIES) ×7
STOCKINETTE 6  STRL (DRAPES) ×1
STOCKINETTE 6 STRL (DRAPES) ×1 IMPLANT
SUT STEEL 2 (SUTURE) IMPLANT
SUT STEEL 4 (SUTURE) IMPLANT
SUT STEEL 5 (SUTURE) IMPLANT
SUT VICRYL RAPIDE 4-0 (SUTURE) ×2 IMPLANT
SUT VICRYL RAPIDE 4/0 PS 2 (SUTURE) IMPLANT
SUT VICRYL+ 3-0 27IN RB-1 (SUTURE) IMPLANT
SYR 10ML LL (SYRINGE) ×2 IMPLANT
SYR BULB 3OZ (MISCELLANEOUS) IMPLANT
TOWEL OR 17X24 6PK STRL BLUE (TOWEL DISPOSABLE) ×2 IMPLANT
TOWEL OR NON WOVEN STRL DISP B (DISPOSABLE) IMPLANT
UNDERPAD 30X30 (UNDERPADS AND DIAPERS) IMPLANT

## 2017-10-02 NOTE — Anesthesia Postprocedure Evaluation (Signed)
Anesthesia Post Note  Patient: Destiny [name]  Procedure(s) Performed: LEFT LONG FINGER ARTHRODESIS (Left )     Patient location during evaluation: PACU Anesthesia Type: General Level of consciousness: awake and alert, patient cooperative and oriented Pain management: pain level controlled Vital Signs Assessment: post-procedure vital signs reviewed and stable Respiratory status: spontaneous breathing, nonlabored ventilation and respiratory function stable Cardiovascular status: blood pressure returned to baseline and stable Postop Assessment: no apparent nausea or vomiting and adequate PO intake Anesthetic complications: no    Last Vitals:  Vitals:   10/02/17 1415 10/02/17 1445  BP: (!) 113/52 (!) 129/57  Pulse: 80 79  Resp: 13 16  Temp:  (!) 36.4 C  SpO2: 97% 96%    Last Pain:  Vitals:   10/02/17 1445  TempSrc:   PainSc: 0-No pain                 Yessica Putnam,E. Taegan Standage

## 2017-10-02 NOTE — Op Note (Signed)
10/02/2017  12:27 PM  PATIENT:  Destiny [name]  76 y.o. female  PRE-OPERATIVE DIAGNOSIS:  L LF PIP OA  POST-OPERATIVE DIAGNOSIS:  Same  PROCEDURE:  L LF PIP arthrodesis  SURGEON: Rayvon Char. Grandville Silos, MD  PHYSICIAN ASSISTANT: Morley Kos, OPA-C  ANESTHESIA:  general  SPECIMENS:  None  DRAINS:   None  EBL:  less than 50 mL  PREOPERATIVE INDICATIONS:  Destiny [name] is a  76 y.o. female with advanced painful L LF PIP OA.  The risks benefits and alternatives were discussed with the patient preoperatively including but not limited to the risks of infection, bleeding, nerve injury, cardiopulmonary complications, the need for revision surgery, among others, and the patient verbalized understanding and consented to proceed.  OPERATIVE IMPLANTS: Extremity Medical APEX IP Fusion device--30 degree, large prox, 86mm distal  OPERATIVE PROCEDURE:  After receiving prophylactic antibiotics, the patient was escorted to the operative theatre and placed in a supine position.  General anesthesia was administered.  A surgical "time-out" was performed during which the planned procedure, proposed operative site, and the correct patient identity were compared to the operative consent and agreement confirmed by the circulating [name] according to current facility policy.  Following application of a tourniquet to the operative extremity, the exposed skin was prepped with Chloraprep and draped in the usual sterile fashion.  The limb was exsanguinated with an Esmarch bandage and the tourniquet inflated to approximately 144mmHg higher than systolic BP.  A slightly curved dorsal incision was made centered on the PIP joint dorsally.  Percent Marcaine with epinephrine was instilled at the base of the digit as a digital block for postoperative pain control.  Full-thickness flaps were elevated.  Extensor tendon was split down the midline.  The capsule was also split and this was reflected radially and ulnarly.  The  dorsal osteophytes were removed with a rongeur.  This was followed radially and ulnarly to release of the collaterals from the head of the proximal phalanx and the joint was thus shotgunned with articular surfaces exposed.  There was severe degenerative change with no remaining articular cartilage and some subsidence of the base of the middle phalanx into the ulnar side of the head of the proximal phalanx.  This resulted in indentation of the surface at the base of the ulnar side of the middle phalanx.  A guidepin from the set was then placed using fluoroscopic guidance down the center of the shaft of the proximal phalanx, after which by hand the conical reamer was introduced.  This allowed for placement of the larger of the 2 screws and the set, 30 degree angulation was selected.  This was advanced to the proper depth and portions of the head of the proximal phalanx were then removed with a rongeur and saved for possible future bone graft.  The articular surface planar was then introduced and used to plane the distal aspect of the proximal phalanx.  With this in place, it also approximated where the middle phalanx with line.  The rotation of the implant was adjusted slightly on the basis of this.  The dorsal cortical window reamer was then used and attention shifted to the middle phalanx.  A large dorsal osteophyte was removed and saved.  A guidepin was placed under fluoroscopic guidance down the center of the canal and then the base of the middle phalanx was reamed with the planar over the wire.  I elected not to continue to remove bone with a reamer all the way down  such that the ulnar base would be planed with it.  Instead, I perforated the subchondral bone with the guidepin and then prepared morselized bone graft with a rongeur that was ultimately packed into the defect.  The fusion site was then irrigated and approximated and the guidepin brought down through the proximal implant and into the shaft of the  middle phalanx.  The appropriate length screw was selected is a 22 and placed over the guidepin down the middle phalanx and providing great compression and apposition of the fusion surfaces.  Rotational micro adjustments were made and final compression placed.  There was good bite and compression.  The wound was irrigated, final fluoroscopic images obtained, and then some additional resection of the bulk of the collateral ligaments radially and ulnarly to help thin the appearance of the PIP joint.  Additionally some of the knobby enlargement of bone was pulverized with a rongeur and packed in and around any dorsal interstices.  The capsule was reapproximated with 4-0 Vicryl Rapide interrupted suture.  The extensor apparatus was repaired with running suture of the same type.  The tourniquet was released, some initial hemostasis obtained and then the skin was closed with running 4-0 Vicryl Rapide horizontal mattress suture.  A short arm splint dressing was applied with both volar and dorsal plaster components, leaving the thumb and index finger free.  She was awakened and taken to the recovery room in stable condition, breathing spontaneously.  DISPOSITION: She will be discharged home today with typical instructions, returning in 10-15 days for reevaluation, with new x-rays of the left long finger out of splint and with follow on hand therapy appointment for postoperative protective splint and initiation of rehab.  The splint can be a finger-based clamshell type splint.

## 2017-10-02 NOTE — Discharge Instructions (Signed)
Discharge Instructions   You have a dressing with a plaster splint incorporated in it. Move your fingers as much as possible, making a full fist and fully opening the fist. Elevate your hand to reduce pain & swelling of the digits.  Ice over the operative site may be helpful to reduce pain & swelling.  DO NOT USE HEAT. Follow your pain management physician's protocol. Leave the dressing in place until you return to our office.  You may shower, but keep the bandage clean & dry.  You may drive a car when you are off of prescription pain medications and can safely control your vehicle with both hands. Our office will call you to arrange follow-up   Please call [phone] during normal business hours or [phone] after hours for any problems. Including the following:  - excessive redness of the incisions - drainage for more than 4 days - fever of more than 101.5 F  *Please note that pain medications will not be refilled after hours or on weekends.     Post Anesthesia Home Care Instructions  Activity: Get plenty of rest for the remainder of the day. A responsible individual must stay with you for 24 hours following the procedure.  For the next 24 hours, DO NOT: -Drive a car -Paediatric nurse -Drink alcoholic beverages -Take any medication unless instructed by your physician -Make any legal decisions or sign important papers.  Meals: Start with liquid foods such as gelatin or soup. Progress to regular foods as tolerated. Avoid greasy, spicy, heavy foods. If nausea and/or vomiting occur, drink only clear liquids until the nausea and/or vomiting subsides. Call your physician if vomiting continues.  Special Instructions/Symptoms: Your throat may feel dry or sore from the anesthesia or the breathing tube placed in your throat during surgery. If this causes discomfort, gargle with warm salt water. The discomfort should disappear within 24 hours.  If you had a scopolamine patch  placed behind your ear for the management of post- operative nausea and/or vomiting:  1. The medication in the patch is effective for 72 hours, after which it should be removed.  Wrap patch in a tissue and discard in the trash. Wash hands thoroughly with soap and water. 2. You may remove the patch earlier than 72 hours if you experience unpleasant side effects which may include dry mouth, dizziness or visual disturbances. 3. Avoid touching the patch. Wash your hands with soap and water after contact with the patch.

## 2017-10-02 NOTE — Anesthesia Preprocedure Evaluation (Addendum)
Anesthesia Evaluation  Patient identified by MRN, date of birth, ID band Patient awake    Reviewed: Allergy & Precautions, NPO status , Patient's Chart, lab work & pertinent test results  History of Anesthesia Complications Negative for: history of anesthetic complications  Airway Mallampati: II  TM Distance: >3 FB Neck ROM: Full    Dental  (+) Dental Advisory Given   Pulmonary neg pulmonary ROS, neg COPD,    breath sounds clear to auscultation       Cardiovascular hypertension, Pt. on medications and Pt. on home beta blockers (-) angina Rhythm:Regular Rate:Normal     Neuro/Psych Anxiety negative neurological ROS     GI/Hepatic negative GI ROS, Neg liver ROS,   Endo/Other  Hypothyroidism   Renal/GU negative Renal ROS     Musculoskeletal  (+) Arthritis ,   Abdominal   Peds  Hematology negative hematology ROS (+)   Anesthesia Other Findings   Reproductive/Obstetrics                            Anesthesia Physical Anesthesia Plan  ASA: II  Anesthesia Plan: General   Post-op Pain Management:    Induction: Intravenous  PONV Risk Score and Plan: 3 and Dexamethasone, Ondansetron and Treatment may vary due to age or medical condition  Airway Management Planned: LMA  Additional Equipment:   Intra-op Plan:   Post-operative Plan:   Informed Consent: I have reviewed the patients History and Physical, chart, labs and discussed the procedure including the risks, benefits and alternatives for the proposed anesthesia with the patient or authorized representative who has indicated his/her understanding and acceptance.   Dental advisory given  Plan Discussed with: CRNA and Surgeon  Anesthesia Plan Comments: (Plan routine monitors, GA- LMA OK)        Anesthesia Quick Evaluation

## 2017-10-02 NOTE — Transfer of Care (Signed)
Immediate Anesthesia Transfer of Care Note  Patient: Destiny [name]  Procedure(s) Performed: LEFT LONG FINGER ARTHRODESIS (Left )  Patient Location: PACU  Anesthesia Type:General  Level of Consciousness: awake, alert  and oriented  Airway & Oxygen Therapy: Patient Spontanous Breathing and Patient connected to face mask oxygen  Post-op Assessment: Report given to RN and Post -op Vital signs reviewed and stable  Post vital signs: Reviewed and stable  Last Vitals:  Vitals:   10/02/17 1401 10/02/17 1402  BP:    Pulse: 85 84  Resp:  15  Temp:    SpO2: 98% 99%    Last Pain:  Vitals:   10/02/17 1139  TempSrc: Oral  PainSc: 0-No pain      Patients Stated Pain Goal: 3 (23/95/32 0233)  Complications: No apparent anesthesia complications

## 2017-10-02 NOTE — Interval H&P Note (Signed)
History and Physical Interval Note:  10/02/2017 12:27 PM  Destiny [name]  has presented today for surgery, with the diagnosis of LEFT LONG FINGER ARTHRITIS M19.042  The various methods of treatment have been discussed with the patient and family. After consideration of risks, benefits and other options for treatment, the patient has consented to  Procedure(s): LEFT LONG FINGER ARTHRODESIS (Left) as a surgical intervention .  The patient's history has been reviewed, patient examined, no change in status, stable for surgery.  I have reviewed the patient's chart and labs.  Questions were answered to the patient's satisfaction.     Khiem Gargis A.

## 2017-10-02 NOTE — Anesthesia Procedure Notes (Signed)
Procedure Name: LMA Insertion Performed by: Verita Lamb, CRNA Pre-anesthesia Checklist: Patient identified, Suction available, Emergency Drugs available, Patient being monitored and Timeout performed Preoxygenation: Pre-oxygenation with 100% oxygen Induction Type: IV induction LMA: LMA inserted LMA Size: 3.0 Tube type: Oral Number of attempts: 1 Placement Confirmation: positive ETCO2,  CO2 detector and breath sounds checked- equal and bilateral Tube secured with: Tape Dental Injury: Teeth and Oropharynx as per pre-operative assessment

## 2017-10-05 NOTE — Addendum Note (Signed)
Addendum  created 10/05/17 1232 by Tawni Millers, CRNA   Charge Capture section accepted

## 2017-10-06 ENCOUNTER — Encounter (HOSPITAL_BASED_OUTPATIENT_CLINIC_OR_DEPARTMENT_OTHER): Payer: Self-pay | Admitting: Orthopedic Surgery

## 2018-01-07 ENCOUNTER — Ambulatory Visit: Payer: Medicare Other | Admitting: Sports Medicine

## 2018-01-07 ENCOUNTER — Encounter: Payer: Self-pay | Admitting: Sports Medicine

## 2018-01-07 DIAGNOSIS — M25552 Pain in left hip: Secondary | ICD-10-CM | POA: Diagnosis not present

## 2018-01-07 DIAGNOSIS — M25551 Pain in right hip: Secondary | ICD-10-CM

## 2018-01-07 NOTE — Patient Instructions (Signed)
Diagnosis: Piriformis muscle spasm or greater trochanter pain syndrome. Continue taking Tylenol for pain. If pain becomes worse, may take an additional dose of Robaxin as needed. 5 exercises on sheet, every day. You may continue to walk and exercise as long as it does not cause pain. Follow up appointment in 1 month. If no better, we may consider more aggressive treatment such as physical therapy or SI joint injection.

## 2018-01-07 NOTE — Assessment & Plan Note (Signed)
This seems related to her core weakness  We will try HEP with 4 key exercises See note  Careful addition of some medicine if needed  Further eval if not improving

## 2018-01-07 NOTE — Progress Notes (Signed)
   Subjective:    Patient ID: Destiny [name], female    DOB: 06/24/1941, 77 y.o.   MRN: [id number]  HPI [name] is a 77 year old female who presents for bilateral hip pain. She has been having this pain for about 1 month. The pain is on her posterior hips and radiates out to the side of the hips. It has been worse on the right, but for the past few days it has been hurting on both sides equally. She does not recall any fall or injury that caused the pain to start. There are no specific injuries that cause her pain to worsen. She states that the pain is worst in the mid afternoon, similar to her other arthritic joints. She denies any pain radiating down her leg or numbness or tingling. Denies any low back pain.    Review of Systems Negative except as stated above - sciatica, numbness, tingling, back pain     Objective:   Physical Exam General: Well appearing, no acute distress BP 130/62   Ht 5\' 1"  (1.549 m)   Wt 150 lb (68 kg)   BMI 28.34 kg/m   Hips: Full ROM with flexion and extension. Full ROM of external rotation. Slightly reduced internal rotation bilaterally with pain exacerbation. Tenderness to palpation of piriformis muscle, L>R. Tender to palpation of SI joint, L>R. No tenderness to palpation of greater trochanters. Weakness of hip abductors L>R. Negative log roll bilaterally.  L-spine: No midline or paraspinal tenderness. Full ROM in all planes, no pain with movement. Negative straight leg raise.       Assessment & Plan:  77 year old female with 1 month of bilateral sacroiliac and hip pain. On exam, tender to palpation of piriformis and SI joint as well as abductor weakness. Suspect piriformis spasm vs greater trochanter pain syndrome as source of the patient's symptoms. She was instructed to continue tylenol for pain and may take a second dose of robaxin for break through pain. She was also given hip abductor and piriformis exercises. Patient will follow up in one month. If she  is not better, will consider more aggressive treatment such as formal physical therapy or SI joint injection.

## 2018-02-09 ENCOUNTER — Ambulatory Visit: Payer: Medicare Other | Admitting: Sports Medicine

## 2018-08-24 ENCOUNTER — Encounter: Payer: Self-pay | Admitting: Sports Medicine

## 2018-08-24 ENCOUNTER — Ambulatory Visit: Payer: Medicare Other | Admitting: Sports Medicine

## 2018-08-24 VITALS — BP 133/67 | Ht 61.0 in | Wt 150.0 lb

## 2018-08-24 DIAGNOSIS — M47812 Spondylosis without myelopathy or radiculopathy, cervical region: Secondary | ICD-10-CM

## 2018-08-24 DIAGNOSIS — M2042 Other hammer toe(s) (acquired), left foot: Secondary | ICD-10-CM | POA: Diagnosis not present

## 2018-08-24 DIAGNOSIS — M7742 Metatarsalgia, left foot: Secondary | ICD-10-CM | POA: Diagnosis not present

## 2018-08-24 DIAGNOSIS — M5030 Other cervical disc degeneration, unspecified cervical region: Secondary | ICD-10-CM

## 2018-08-24 NOTE — Progress Notes (Addendum)
HPI  CC: Neck pain and left foot pain  Destiny [name] is a 77 year old female presents for neck pain and left foot pain.  She states that her neck pain is been present for many years.  She states that she notices it worse when she is tried to drive her car, and she cannot look over his shoulder to the left and to the right.  She states that this is the motion and asked the pain the worst.  She is previously seen by Dr. Dayna Ramus orthopedics back in April.  She had x-rays performed at that time that showed degenerative disc disease at multiple levels of the cervical spine, as well as facet arthritis in cervical spine.  At that time she underwent physical therapy for 6 sessions.  She states this did help get her some range of motion back.  She has been trimethylcarbinol, as well as hydrocodone twice a day with some relief.  She has not been doing home exercises since that time.  She states the pain has gotten worse and she has not done any the physical therapy.  She denies any numbness and tingling shooting down her arms.  She denies any weakness of her upper extremity.  She has no trauma to the area.  She has no recent car crashes or anything of that nature.  She does not member any inciting event for the neck pain.  She also reports left foot pain.  This is been an ongoing issue for her in the past.  She has collapse of her transverse arch in both feet.  She also has hammertoes on both feet as well.  She states that she does not necessarily have pain in her feet, but that she has a feeling of imbalance because her foot wants to roll outward.  She has been wearing orthotics Soles, with a metatarsal pad in her left shoe so for the past several months.  She states this has helped somewhat.Birkenstocks help.  See HPI and/or previous note for associated ROS.  Objective: BP 133/67   Ht 5\' 1"  (1.549 m)   Wt 150 lb (68 kg)   BMI 28.34 kg/m  Gen: Right-Hand Dominant. NAD, well groomed, a/o x3, normal  affect.  CV: Well-perfused. Warm.  Resp: Non-labored.  Neuro: Sensation intact throughout. No gross coordination deficits.  Gait: Kyphotic posture, no limp on exam.  Left foot exam: No erythema, warmth, or swelling.  Complete loss of transverse arch and left foot.  Third digit hammertoe with curling under the second digit.  Callus on the medial aspect of great toe.  No tenderness palpation on exam.  Full range of motion in plantarflexion, dorsiflexion, eversion, inversion.  Strength 5 out of 5 throughout testing.  Neck exam: No erythema, warmth, swelling.  Forward positioning of the neck.  Tenderness palpation along cervical spine around C4-C5.  Tenderness palpation along the superior border of bilateral trapezius muscles.  Full range of motion in forward flexion of the neck.  Full range of motion extension the neck.  Limited range of motion to around 15 degrees of rotation to the left into the right.  Pain with rotation of the neck.  Strength 5 out of 5 throughout all testing of the neck.  Strength 5 out of 5 throughout bilateral upper extremity testing.  Sensation intact throughout exam.  Negative Spurling's bilaterally.  Assessment and plan: 1. Multiple level cervical DDD 2. Cervical facet arthritis 3.  Left 3rd digit hammertoe with collapse of transverse arch.  Discussed  treatment options with Leda Gauze at today's visit.  She would benefit greatly from home exercises at this time.  We have advised her to do isometric neck exercises.  These exercise involved motions of forward flexion, extension, and rotational motions of the neck.  She is to hold these motions for 10 seconds, 5 times daily for range of motion.  We have also advised her to do "T" and "I" exercises on her back.  This information was provided with her at today's visit.  We also advised her to place heat over the affected area as needed for pain relief.  She continues in her pain medications under the guidance of her pain management  physician.  We will have her use a soft collar for an hour at a time a few times daily.    For her loss of transverse arch, we have given her a metatarsal pad on her left sole.  We will provide her with a second 1 if she needs it in her right sole.  We will see her back for follow-up in 4 to 6 weeks.  We could discuss formal physical therapy if she has no benefit at that time.   Lewanda Rife, MD [facility] Fellow 08/24/2018 1:32 PM  I observed and examined the patient with the [facility] Fellow and agree with assessment and plan.  Note reviewed and modified by me. Ila Mcgill, MD

## 2018-08-24 NOTE — Patient Instructions (Signed)
Thank you for coming to see [place] today.  You were diagnosed with cervical neck arthritis at today's visit.  We would like you to do the following exercises daily: Isometric neck resistance:  Bend your neck forward and hold it for 10 seconds.  Do this 5 times daily. Bend your neck back and hold it for 10 seconds.  Do this 5 times daily. Bend your neck to each side and hold it for 10 seconds.  Do this 5 times daily per side.  Lie on your back with a pillow under your shoulders.  Hold your arms in a "T" to your side.  Do this for 30-60 seconds daily Lie on your back with a pillow under your shoulders.  Hold your arms in a "I" above your head.  Do this for 30-60 seconds daily

## 2018-08-31 ENCOUNTER — Telehealth: Payer: Self-pay | Admitting: *Deleted

## 2018-08-31 NOTE — Telephone Encounter (Signed)
-----   Message from Mangham, MD sent at 08/30/2018  4:10 PM EDT ----- Katherina Mires - Please contact Ms Foresta to see if she would be interested in being seen at our [place] clinic.  Thank you, Sherren Mocha

## 2018-08-31 NOTE — Telephone Encounter (Signed)
LM for patient to call me back so we can discuss [place] clinic, what it is about and if she is interested in coming in.  [facility]

## 2018-09-06 ENCOUNTER — Encounter: Payer: Self-pay | Admitting: *Deleted

## 2018-09-06 NOTE — Telephone Encounter (Signed)
Letter mailed to patient along with information brochure.  Shelsy Seng,CMA

## 2018-09-06 NOTE — Telephone Encounter (Signed)
Would you like me to mail a information brochure to her?  Ebrahim Deremer,CMA

## 2018-09-06 NOTE — Telephone Encounter (Signed)
Good Idea

## 2018-10-05 ENCOUNTER — Ambulatory Visit: Payer: Medicare Other | Admitting: Sports Medicine

## 2018-11-25 DIAGNOSIS — H903 Sensorineural hearing loss, bilateral: Secondary | ICD-10-CM | POA: Diagnosis not present

## 2018-11-29 DIAGNOSIS — E871 Hypo-osmolality and hyponatremia: Secondary | ICD-10-CM | POA: Diagnosis not present

## 2018-11-29 DIAGNOSIS — H903 Sensorineural hearing loss, bilateral: Secondary | ICD-10-CM | POA: Diagnosis not present

## 2018-11-29 DIAGNOSIS — E039 Hypothyroidism, unspecified: Secondary | ICD-10-CM | POA: Diagnosis not present

## 2018-12-01 DIAGNOSIS — I10 Essential (primary) hypertension: Secondary | ICD-10-CM | POA: Diagnosis not present

## 2018-12-01 DIAGNOSIS — R252 Cramp and spasm: Secondary | ICD-10-CM | POA: Diagnosis not present

## 2018-12-01 DIAGNOSIS — E890 Postprocedural hypothyroidism: Secondary | ICD-10-CM | POA: Diagnosis not present

## 2018-12-01 DIAGNOSIS — E871 Hypo-osmolality and hyponatremia: Secondary | ICD-10-CM | POA: Diagnosis not present

## 2018-12-14 DIAGNOSIS — M19042 Primary osteoarthritis, left hand: Secondary | ICD-10-CM | POA: Diagnosis not present

## 2018-12-14 DIAGNOSIS — M19041 Primary osteoarthritis, right hand: Secondary | ICD-10-CM | POA: Diagnosis not present

## 2018-12-14 DIAGNOSIS — G894 Chronic pain syndrome: Secondary | ICD-10-CM | POA: Diagnosis not present

## 2018-12-14 DIAGNOSIS — Z79891 Long term (current) use of opiate analgesic: Secondary | ICD-10-CM | POA: Diagnosis not present

## 2018-12-27 DIAGNOSIS — H905 Unspecified sensorineural hearing loss: Secondary | ICD-10-CM | POA: Diagnosis not present

## 2019-01-31 DIAGNOSIS — I10 Essential (primary) hypertension: Secondary | ICD-10-CM | POA: Diagnosis not present

## 2019-01-31 DIAGNOSIS — E871 Hypo-osmolality and hyponatremia: Secondary | ICD-10-CM | POA: Diagnosis not present

## 2019-01-31 DIAGNOSIS — E890 Postprocedural hypothyroidism: Secondary | ICD-10-CM | POA: Diagnosis not present

## 2019-02-08 DIAGNOSIS — Z79891 Long term (current) use of opiate analgesic: Secondary | ICD-10-CM | POA: Diagnosis not present

## 2019-02-08 DIAGNOSIS — M19042 Primary osteoarthritis, left hand: Secondary | ICD-10-CM | POA: Diagnosis not present

## 2019-02-08 DIAGNOSIS — G894 Chronic pain syndrome: Secondary | ICD-10-CM | POA: Diagnosis not present

## 2019-02-08 DIAGNOSIS — M19041 Primary osteoarthritis, right hand: Secondary | ICD-10-CM | POA: Diagnosis not present

## 2019-03-10 ENCOUNTER — Encounter: Payer: Self-pay | Admitting: Sports Medicine

## 2019-03-10 ENCOUNTER — Ambulatory Visit (INDEPENDENT_AMBULATORY_CARE_PROVIDER_SITE_OTHER): Payer: Medicare Other | Admitting: Sports Medicine

## 2019-03-10 ENCOUNTER — Other Ambulatory Visit: Payer: Self-pay

## 2019-03-10 DIAGNOSIS — M1711 Unilateral primary osteoarthritis, right knee: Secondary | ICD-10-CM | POA: Insufficient documentation

## 2019-03-10 NOTE — Assessment & Plan Note (Signed)
Recommended compression 2 to 3 x day for 30 mins Icing every 3 to 4 hours Tylenol for mild pain Hydrocodone only when severe  No CSI until we get through COVID issues  Reck 1 month

## 2019-03-10 NOTE — Progress Notes (Signed)
Patient for video visit MD - At [facility] office/ patient at home Patient gives consent and understands limitations  78 Y/O F with known OA presents with knee swelling and pain  S/p partial knee replacent on RT CSI done 04/2017 by me Uses some hydrocodone Seen 10/31 at [facility] and another CSI Since home for Cor[place] virus walking more Last 3 days knee swelling Last night unable to sleep Has generalized worsening of OA Sees Dr Hardin Negus and gets hydrocodone at pain clinic This helps but uses sparingly Can occasionally use ibuprofen but bruises Tylenol works for milder pain  significant hand OA documented with XR  Past hx  Noted in chart  Has peripheral neuropahty Remains on gabapentin recent problems with low Na  Soc Hx 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

## 2019-03-23 DIAGNOSIS — Z8672 Personal history of thrombophlebitis: Secondary | ICD-10-CM | POA: Diagnosis not present

## 2019-03-23 DIAGNOSIS — R2241 Localized swelling, mass and lump, right lower limb: Secondary | ICD-10-CM | POA: Diagnosis not present

## 2019-03-23 DIAGNOSIS — M1990 Unspecified osteoarthritis, unspecified site: Secondary | ICD-10-CM | POA: Diagnosis not present

## 2019-03-23 DIAGNOSIS — I10 Essential (primary) hypertension: Secondary | ICD-10-CM | POA: Diagnosis not present

## 2019-03-24 ENCOUNTER — Ambulatory Visit: Payer: PRIVATE HEALTH INSURANCE | Admitting: Sports Medicine

## 2019-03-25 DIAGNOSIS — R2241 Localized swelling, mass and lump, right lower limb: Secondary | ICD-10-CM | POA: Diagnosis not present

## 2019-03-25 DIAGNOSIS — E871 Hypo-osmolality and hyponatremia: Secondary | ICD-10-CM | POA: Diagnosis not present

## 2019-03-25 DIAGNOSIS — M542 Cervicalgia: Secondary | ICD-10-CM | POA: Diagnosis not present

## 2019-03-25 DIAGNOSIS — C4921 Malignant neoplasm of connective and soft tissue of right lower limb, including hip: Secondary | ICD-10-CM

## 2019-03-25 HISTORY — DX: Malignant neoplasm of connective and soft tissue of right lower limb, including hip: C49.21

## 2019-03-28 ENCOUNTER — Other Ambulatory Visit: Payer: Self-pay | Admitting: Internal Medicine

## 2019-03-28 ENCOUNTER — Other Ambulatory Visit: Payer: Self-pay | Admitting: General Surgery

## 2019-03-28 DIAGNOSIS — R2241 Localized swelling, mass and lump, right lower limb: Secondary | ICD-10-CM

## 2019-03-28 DIAGNOSIS — L03115 Cellulitis of right lower limb: Secondary | ICD-10-CM

## 2019-03-29 ENCOUNTER — Other Ambulatory Visit: Payer: Self-pay

## 2019-03-29 ENCOUNTER — Telehealth: Payer: Self-pay | Admitting: General Surgery

## 2019-03-29 ENCOUNTER — Ambulatory Visit
Admission: RE | Admit: 2019-03-29 | Discharge: 2019-03-29 | Disposition: A | Discharge status: Home / Self Care | Payer: Medicare Other | Source: Ambulatory Visit | Attending: General Surgery | Admitting: General Surgery

## 2019-03-29 DIAGNOSIS — L03115 Cellulitis of right lower limb: Secondary | ICD-10-CM | POA: Diagnosis not present

## 2019-03-29 MED ORDER — IOPAMIDOL (ISOVUE-300) INJECTION 61%
100.0000 mL | Freq: Once | INTRAVENOUS | Status: AC | PRN
  Administered 2019-03-29: 100 mL via INTRAVENOUS

## 2019-03-29 NOTE — Telephone Encounter (Signed)
Called several times.  Left message about CT scan.  Ordering biopsy.

## 2019-03-30 ENCOUNTER — Other Ambulatory Visit (HOSPITAL_COMMUNITY): Payer: Self-pay | Admitting: General Surgery

## 2019-03-30 DIAGNOSIS — R2241 Localized swelling, mass and lump, right lower limb: Secondary | ICD-10-CM

## 2019-03-30 DIAGNOSIS — L03115 Cellulitis of right lower limb: Secondary | ICD-10-CM

## 2019-04-01 ENCOUNTER — Other Ambulatory Visit: Payer: Self-pay | Admitting: Student

## 2019-04-01 ENCOUNTER — Other Ambulatory Visit: Payer: Self-pay | Admitting: Physician Assistant

## 2019-04-04 ENCOUNTER — Other Ambulatory Visit: Payer: Self-pay

## 2019-04-04 ENCOUNTER — Encounter (HOSPITAL_COMMUNITY): Payer: Self-pay

## 2019-04-04 ENCOUNTER — Ambulatory Visit (HOSPITAL_COMMUNITY)
Admission: RE | Admit: 2019-04-04 | Discharge: 2019-04-04 | Disposition: A | Discharge status: Home / Self Care | Payer: Medicare Other | Source: Ambulatory Visit | Attending: General Surgery | Admitting: General Surgery

## 2019-04-04 DIAGNOSIS — Z886 Allergy status to analgesic agent status: Secondary | ICD-10-CM | POA: Insufficient documentation

## 2019-04-04 DIAGNOSIS — Z79899 Other long term (current) drug therapy: Secondary | ICD-10-CM | POA: Diagnosis not present

## 2019-04-04 DIAGNOSIS — I10 Essential (primary) hypertension: Secondary | ICD-10-CM | POA: Diagnosis not present

## 2019-04-04 DIAGNOSIS — Z885 Allergy status to narcotic agent status: Secondary | ICD-10-CM | POA: Insufficient documentation

## 2019-04-04 DIAGNOSIS — E890 Postprocedural hypothyroidism: Secondary | ICD-10-CM | POA: Diagnosis not present

## 2019-04-04 DIAGNOSIS — R51 Headache: Secondary | ICD-10-CM | POA: Insufficient documentation

## 2019-04-04 DIAGNOSIS — C7651 Malignant neoplasm of right lower limb: Secondary | ICD-10-CM | POA: Diagnosis not present

## 2019-04-04 DIAGNOSIS — F419 Anxiety disorder, unspecified: Secondary | ICD-10-CM | POA: Insufficient documentation

## 2019-04-04 DIAGNOSIS — C4922 Malignant neoplasm of connective and soft tissue of left lower limb, including hip: Secondary | ICD-10-CM | POA: Diagnosis not present

## 2019-04-04 DIAGNOSIS — M1990 Unspecified osteoarthritis, unspecified site: Secondary | ICD-10-CM | POA: Insufficient documentation

## 2019-04-04 DIAGNOSIS — Z8249 Family history of ischemic heart disease and other diseases of the circulatory system: Secondary | ICD-10-CM | POA: Insufficient documentation

## 2019-04-04 DIAGNOSIS — Z79890 Hormone replacement therapy (postmenopausal): Secondary | ICD-10-CM | POA: Insufficient documentation

## 2019-04-04 DIAGNOSIS — L03115 Cellulitis of right lower limb: Secondary | ICD-10-CM | POA: Diagnosis not present

## 2019-04-04 DIAGNOSIS — R2241 Localized swelling, mass and lump, right lower limb: Secondary | ICD-10-CM | POA: Diagnosis not present

## 2019-04-04 DIAGNOSIS — Z881 Allergy status to other antibiotic agents status: Secondary | ICD-10-CM | POA: Diagnosis not present

## 2019-04-04 DIAGNOSIS — Z888 Allergy status to other drugs, medicaments and biological substances status: Secondary | ICD-10-CM | POA: Diagnosis not present

## 2019-04-04 DIAGNOSIS — R2242 Localized swelling, mass and lump, left lower limb: Secondary | ICD-10-CM | POA: Diagnosis not present

## 2019-04-04 LAB — CBC WITH DIFFERENTIAL/PLATELET
Abs Immature Granulocytes: 0.14 10*3/uL — ABNORMAL HIGH (ref 0.00–0.07)
Basophils Absolute: 0.1 10*3/uL (ref 0.0–0.1)
Basophils Relative: 1 %
Eosinophils Absolute: 0.5 10*3/uL (ref 0.0–0.5)
Eosinophils Relative: 3 %
HCT: 29.9 % — ABNORMAL LOW (ref 36.0–46.0)
Hemoglobin: 9.9 g/dL — ABNORMAL LOW (ref 12.0–15.0)
Immature Granulocytes: 1 %
Lymphocytes Relative: 10 %
Lymphs Abs: 1.6 10*3/uL (ref 0.7–4.0)
MCH: 32.2 pg (ref 26.0–34.0)
MCHC: 33.1 g/dL (ref 30.0–36.0)
MCV: 97.4 fL (ref 80.0–100.0)
Monocytes Absolute: 1.4 10*3/uL — ABNORMAL HIGH (ref 0.1–1.0)
Monocytes Relative: 9 %
Neutro Abs: 12.2 10*3/uL — ABNORMAL HIGH (ref 1.7–7.7)
Neutrophils Relative %: 76 %
Platelets: 604 10*3/uL — ABNORMAL HIGH (ref 150–400)
RBC: 3.07 MIL/uL — ABNORMAL LOW (ref 3.87–5.11)
RDW: 11.9 % (ref 11.5–15.5)
WBC: 16 10*3/uL — ABNORMAL HIGH (ref 4.0–10.5)
nRBC: 0 % (ref 0.0–0.2)

## 2019-04-04 LAB — APTT: aPTT: 34 seconds (ref 24–36)

## 2019-04-04 LAB — PROTIME-INR
INR: 1.1 (ref 0.8–1.2)
Prothrombin Time: 14.5 seconds (ref 11.4–15.2)

## 2019-04-04 MED ORDER — SODIUM CHLORIDE 0.9 % IV SOLN: INTRAVENOUS | Status: DC

## 2019-04-04 MED ORDER — LIDOCAINE HCL (PF) 1 % IJ SOLN
INTRAMUSCULAR | Status: AC
  Filled 2019-04-04: qty 30

## 2019-04-04 MED ORDER — MIDAZOLAM HCL 2 MG/2ML IJ SOLN
INTRAMUSCULAR | Status: AC
  Filled 2019-04-04: qty 2

## 2019-04-04 MED ORDER — FENTANYL CITRATE (PF) 100 MCG/2ML IJ SOLN
INTRAMUSCULAR | Status: AC | PRN
  Administered 2019-04-04: 25 ug via INTRAVENOUS

## 2019-04-04 MED ORDER — FENTANYL CITRATE (PF) 100 MCG/2ML IJ SOLN
INTRAMUSCULAR | Status: AC
  Filled 2019-04-04: qty 2

## 2019-04-04 MED ORDER — MIDAZOLAM HCL 2 MG/2ML IJ SOLN
INTRAMUSCULAR | Status: AC | PRN
  Administered 2019-04-04: 1 mg via INTRAVENOUS

## 2019-04-04 NOTE — H&P (Signed)
Chief Complaint: Patient was seen in consultation today for right thigh mass/biopsy.  Referring Physician(s): TKWIOX,BDZHG  Supervising Physician: Sandi Mariscal  Patient Status: [facility] - Out-pt  History of Present Illness: Destiny [name] is a 78 y.o. female with a past medical history of hypertension, hepatitis A 1976, hypothyroidism, osteoarthritis, and anxiety. She has had pain of her right leg for weeks, and was referred to general surgery for further management.  CT extremity lower right 03/29/2019: 1. 5.9 x 5.4 x 11.5 cm intramuscular mass in the distal right sartorius muscle, concerning for liposarcoma. Potential involvement of the adjacent distal gracilis and semimembranosus muscles. 2. Prominent skin thickening and mild to moderate soft tissue edema along the mid to distal medial and posterior thigh, consistent with reported clinical history of cellulitis. No abscess. 3. No acute osseous abnormality.  IR requested by Dr. Barry Dienes for possible image-guided right thigh mass biopsy. Patient awake and alert laying in bed. Complains of headaches, states this is normal for her as she has "sinus issues". Denies fever, chills, chest pain, dyspnea, or abdominal pain.   Past Medical History:  Diagnosis Date  . Anxiety   . Arthritis   . Colon polyps   . Complication of anesthesia    BP drops with fentyl and versed  . Hepatitis    hep A  1976  . Hypertension   . Hypothyroidism     Past Surgical History:  Procedure Laterality Date  . ABDOMINAL HYSTERECTOMY    . APPENDECTOMY    . BACK SURGERY      fusion  . CESAREAN SECTION     times 2  . EYE SURGERY Left    cataract removed  . FINGER ARTHRODESIS Left 10/02/2017   Procedure: LEFT LONG FINGER ARTHRODESIS;  Surgeon: Milly Jakob, MD;  Location: [facility];  Service: Orthopedics;  Laterality: Left;  . FOOT SURGERY Bilateral   . FRACTURE SURGERY     broken ankle  . MEDIAL PARTIAL KNEE REPLACEMENT Left    done  at [place] Right 12/28/2014   Procedure: REVERSE SHOULDER ARTHROPLASTY;  Surgeon: Nita Sells, MD;  Location: [facility];  Service: Orthopedics;  Laterality: Right;  Right reverse total shoulder arthroplasty  . REVERSE TOTAL SHOULDER ARTHROPLASTY Right 12/28/2014   DR CHANDLER  . rotator cufff    . THYROIDECTOMY, PARTIAL     one lobe removed  . VARICOSE VEIN SURGERY Right     Allergies: Nsaids; Aspirin; Ciprocin-fluocin-procin [fluocinolone]; Ciprofloxacin hcl; Fentanyl; Lyrica [pregabalin]; Oxycodone; Versed [midazolam]; Hydromorphone hcl; and Voltaren gel [diclofenac sodium]  Medications: Prior to Admission medications   Medication Sig Start Date End Date Taking? Authorizing Provider  acetaminophen (TYLENOL) 500 MG tablet Take 500 mg by mouth every 4 (four) hours as needed.   Yes [provider]  amLODipine (NORVASC) 10 MG tablet Take by mouth.   Yes [provider]  b complex vitamins capsule Take 1 capsule by mouth daily.   Yes [provider]  Calcium Carb-Cholecalciferol 600-200 MG-UNIT TABS Take 1 tablet by mouth daily.    Yes [provider]  cholecalciferol (VITAMIN D) 1000 UNITS tablet Take 2,000 Units by mouth daily.   Yes [provider]  gabapentin (NEURONTIN) 300 MG capsule Take 300-900 mg by mouth 2 (two) times daily. Take 1 capsule (300 mg) every morning and 3 capsules (900 mg) at bedtime   Yes [provider]  HYDROcodone-acetaminophen (NORCO/VICODIN) 5-325 MG tablet Take by mouth. 03/06/14  Yes  [provider]  ipratropium (ATROVENT) 0.06 % nasal spray  09/30/16  Yes [provider]  levothyroxine (SYNTHROID, LEVOTHROID) 125 MCG tablet Take 125 mcg by mouth daily before breakfast.    Yes [provider]  loratadine-pseudoephedrine (CLARITIN-D 12-HOUR) 5-120 MG per tablet Take 1 tablet by mouth daily.   Yes [provider]  methocarbamol (ROBAXIN)  750 MG tablet Take 750 mg by mouth 2 (two) times daily as needed for muscle spasms.  06/02/11  Yes [provider]  metoprolol succinate (TOPROL-XL) 50 MG 24 hr tablet Take 50 mg by mouth 2 (two) times daily.    Yes [provider]  nitrofurantoin, macrocrystal-monohydrate, (MACROBID) 100 MG capsule Take 100 mg by mouth daily.    Yes [provider]  triamterene-hydrochlorothiazide (MAXZIDE-25) 37.5-25 MG tablet  08/19/17  Yes [provider]  zolpidem (AMBIEN) 10 MG tablet Take 5 mg by mouth at bedtime.  09/30/14  Yes [provider]  busPIRone (BUSPAR) 15 MG tablet Take 15 mg by mouth 2 (two) times daily after a meal.  09/17/14   [provider]     Family History  Problem Relation Age of Onset  . Heart disease Mother   . Atrial fibrillation Mother   . Cancer - Other Father     Social History   Socioeconomic History  . Marital status: Married    Spouse name: Not on file  . Number of children: Not on file  . Years of education: Not on file  . Highest education level: Not on file  Occupational History  . Not on file  Social Needs  . Financial resource strain: Not on file  . Food insecurity:    Worry: Not on file    Inability: Not on file  . Transportation needs:    Medical: Not on file    Non-medical: Not on file  Tobacco Use  . Smoking status: Never Smoker  . Smokeless tobacco: Never Used  Substance and Sexual Activity  . Alcohol use: Yes    Comment: rarely  . Drug use: No  . Sexual activity: Not on file  Lifestyle  . Physical activity:    Days per week: Not on file    Minutes per session: Not on file  . Stress: Not on file  Relationships  . Social connections:    Talks on phone: Not on file    Gets together: Not on file    Attends religious service: Not on file    Active member of club or organization: Not on file    Attends meetings of clubs or organizations: Not on file    Relationship status: Not on file   Other Topics Concern  . Not on file  Social History Narrative  . Not on file     Review of Systems: A 12 point ROS discussed and pertinent positives are indicated in the HPI above.  All other systems are negative.  Review of Systems  Constitutional: Negative for chills and fever.  Respiratory: Negative for shortness of breath and wheezing.   Cardiovascular: Negative for chest pain and palpitations.  Gastrointestinal: Negative for abdominal pain.  Neurological: Positive for headaches.  Psychiatric/Behavioral: Negative for behavioral problems and confusion.    Vital Signs: BP (!) 116/54   Pulse 71   Temp 98 F (36.7 C) (Oral)   Resp 16   Ht 5\' 1"  (1.549 m)   Wt 150 lb (68 kg)   SpO2 93%   BMI 28.34 kg/m  Physical Exam Vitals signs and nursing note reviewed.  Constitutional:      General: She is not in acute distress.    Appearance: Normal appearance.  Cardiovascular:     Rate and Rhythm: Normal rate and regular rhythm.     Heart sounds: Normal heart sounds. No murmur.  Pulmonary:     Effort: Pulmonary effort is normal. No respiratory distress.     Breath sounds: Normal breath sounds. No wheezing.  Skin:    General: Skin is warm and dry.  Neurological:     Mental Status: She is alert and oriented to person, place, and time.  Psychiatric:        Mood and Affect: Mood normal.        Behavior: Behavior normal.        Thought Content: Thought content normal.        Judgment: Judgment normal.      MD Evaluation Airway: WNL Heart: WNL Abdomen: WNL Chest/ Lungs: WNL ASA  Classification: 3 Mallampati/Airway Score: Two   Imaging: Ct Extremity Lower Right W Contrast  Result Date: 03/29/2019 CLINICAL DATA:  Right thigh cellulitis. EXAM: CT OF THE LOWER RIGHT EXTREMITY WITH CONTRAST TECHNIQUE: Multidetector CT imaging of the lower right extremity was performed according to the standard protocol following intravenous contrast administration. COMPARISON:  None.  CONTRAST:  170mL ISOVUE-300 IOPAMIDOL (ISOVUE-300) INJECTION 61% Creatinine was obtained on site at [place] at [address] Results: Creatinine 0.8 mg/dL. FINDINGS: Bones/Joint/Cartilage No acute fracture or dislocation. Prior right knee medial compartment arthroplasty. Trace knee joint effusion. Mild left hip joint space narrowing with small marginal osteophytes. Ligaments Suboptimally assessed by CT. Muscles and Tendons Large 5.9 x 5.4 x 11.5 cm complex mass in the distal right sartorius muscle containing fat and a large cystic component with thickened, enhancing septa, as well as prominent areas of ill-defined nodular solid enhancement. There is loss of the normal fat planes with the adjacent distal gracilis and semimembranosus muscles. Soft tissues Prominent skin thickening and mild-to-moderate soft tissue edema along the mid to distal medial and posterior thigh. No fluid collection. The vessels are patent and uninvolved by the distal thigh mass. No lymphadenopathy. IMPRESSION: 1. 5.9 x 5.4 x 11.5 cm intramuscular mass in the distal right sartorius muscle, concerning for liposarcoma. Potential involvement of the adjacent distal gracilis and semimembranosus muscles. 2. Prominent skin thickening and mild to moderate soft tissue edema along the mid to distal medial and posterior thigh, consistent with reported clinical history of cellulitis. No abscess. 3. No acute osseous abnormality. These results will be called to the ordering clinician or representative by the Radiologist Assistant, and communication documented in the PACS or zVision Dashboard. Electronically Signed   By: Titus Dubin M.D.   On: 03/29/2019 10:43    Labs:  CBC: Recent Labs    04/04/19 0557  WBC 16.0*  HGB 9.9*  HCT 29.9*  PLT 604*    COAGS: Recent Labs    04/04/19 0557  INR 1.1  APTT 34     Assessment and Plan:  Right thigh mass. Plan for image-guided right thigh mass biopsy today with Dr. Pascal Lux.  Patient is NPO. Afebrile. She does not take blood thinners. INR 1.1 seconds today.  Risks and benefits discussed with the patient including, but not limited to bleeding, infection, damage to adjacent structures or low yield requiring additional tests. All of the patient's questions were answered, patient is agreeable to proceed. Consent signed and in chart.   Thank  you for this interesting consult.  I greatly enjoyed meeting Destiny [name] and look forward to participating in their care.  A copy of this report was sent to the requesting provider on this date.  Electronically Signed: Earley Abide, PA-C 04/04/2019, 7:49 AM   I spent a total of 40 Minutes in face to face in clinical consultation, greater than 50% of which was counseling/coordinating care for right thigh mass/biopsy.

## 2019-04-04 NOTE — Discharge Instructions (Addendum)
Needle Biopsy, Care After °This sheet gives you information about how to care for yourself after your procedure. Your health care provider may also give you more specific instructions. If you have problems or questions, contact your health care provider. °What can I expect after the procedure? °After the procedure, it is common to have soreness, bruising, or mild pain at the puncture site. This should go away in a few days. °Follow these instructions at home: °Needle insertion site care ° °· Wash your hands with soap and water before you change your bandage (dressing). If you cannot use soap and water, use hand sanitizer. °· Follow instructions from your health care provider about how to take care of your puncture site. This includes: °? When and how to change your dressing. °? When to remove your dressing. °· Check your puncture site every day for signs of infection. Check for: °? Redness, swelling, or pain. °? Fluid or blood. °? Pus or a bad smell. °? Warmth. °General instructions °· Return to your normal activities as told by your health care provider. Ask your health care provider what activities are safe for you. °· Do not take baths, swim, or use a hot tub until your health care provider approves. Ask your health care provider if you may take showers. You may only be allowed to take sponge baths. °· Take over-the-counter and prescription medicines only as told by your health care provider. °· Keep all follow-up visits as told by your health care provider. This is important. °Contact a health care provider if: °· You have a fever. °· You have redness, swelling, or pain at the puncture site that lasts longer than a few days. °· You have fluid, blood, or pus coming from your puncture site. °· Your puncture site feels warm to the touch. °Get help right away if: °· You have severe bleeding from the puncture site. °Summary °· After the procedure, it is common to have soreness, bruising, or mild pain at the puncture  site. This should go away in a few days. °· Check your puncture site every day for signs of infection, such as redness, swelling, or pain. °· Get help right away if you have severe bleeding from your puncture site. °This information is not intended to replace advice given to you by your health care provider. Make sure you discuss any questions you have with your health care provider. °Document Released: 03/27/2015 Document Revised: 11/23/2017 Document Reviewed: 11/23/2017 °Elsevier Interactive Patient Education © 2019 [employer] °Moderate Conscious Sedation, Adult, Care After °These instructions provide you with information about caring for yourself after your procedure. Your health care provider may also give you more specific instructions. Your treatment has been planned according to current medical practices, but problems sometimes occur. Call your health care provider if you have any problems or questions after your procedure. °What can I expect after the procedure? °After your procedure, it is common: °· To feel sleepy for several hours. °· To feel clumsy and have poor balance for several hours. °· To have poor judgment for several hours. °· To vomit if you eat too soon. °Follow these instructions at home: °For at least 24 hours after the procedure: ° °· Do not: °? Participate in activities where you could fall or become injured. °? Drive. °? Use heavy machinery. °? Drink alcohol. °? Take sleeping pills or medicines that cause drowsiness. °? Make important decisions or sign legal documents. °? Take care of children on your own. °· Rest. °Eating   and drinking °· Follow the diet recommended by your health care provider. °· If you vomit: °? Drink water, juice, or soup when you can drink without vomiting. °? Make sure you have little or no nausea before eating solid foods. °General instructions °· Have a responsible adult stay with you until you are awake and alert. °· Take over-the-counter and prescription  medicines only as told by your health care provider. °· If you smoke, do not smoke without supervision. °· Keep all follow-up visits as told by your health care provider. This is important. °Contact a health care provider if: °· You keep feeling nauseous or you keep vomiting. °· You feel light-headed. °· You develop a rash. °· You have a fever. °Get help right away if: °· You have trouble breathing. °This information is not intended to replace advice given to you by your health care provider. Make sure you discuss any questions you have with your health care provider. °Document Released: 08/31/2013 Document Revised: 04/14/2016 Document Reviewed: 03/01/2016 °Elsevier Interactive Patient Education © 2019 Elsevier Inc. ° °

## 2019-04-07 DIAGNOSIS — R2241 Localized swelling, mass and lump, right lower limb: Secondary | ICD-10-CM | POA: Diagnosis not present

## 2019-04-07 DIAGNOSIS — M6289 Other specified disorders of muscle: Secondary | ICD-10-CM | POA: Diagnosis not present

## 2019-04-12 DIAGNOSIS — C4921 Malignant neoplasm of connective and soft tissue of right lower limb, including hip: Secondary | ICD-10-CM | POA: Diagnosis not present

## 2019-04-12 DIAGNOSIS — Z01818 Encounter for other preprocedural examination: Secondary | ICD-10-CM | POA: Diagnosis not present

## 2019-04-12 DIAGNOSIS — Z1159 Encounter for screening for other viral diseases: Secondary | ICD-10-CM | POA: Diagnosis not present

## 2019-04-12 DIAGNOSIS — M79604 Pain in right leg: Secondary | ICD-10-CM | POA: Diagnosis not present

## 2019-04-13 ENCOUNTER — Encounter (HOSPITAL_COMMUNITY): Payer: Self-pay

## 2019-04-15 DIAGNOSIS — R2241 Localized swelling, mass and lump, right lower limb: Secondary | ICD-10-CM | POA: Diagnosis not present

## 2019-04-15 DIAGNOSIS — G8918 Other acute postprocedural pain: Secondary | ICD-10-CM | POA: Diagnosis not present

## 2019-04-15 DIAGNOSIS — C4921 Malignant neoplasm of connective and soft tissue of right lower limb, including hip: Secondary | ICD-10-CM | POA: Diagnosis not present

## 2019-04-15 DIAGNOSIS — M79604 Pain in right leg: Secondary | ICD-10-CM | POA: Diagnosis not present

## 2019-04-16 DIAGNOSIS — R2241 Localized swelling, mass and lump, right lower limb: Secondary | ICD-10-CM | POA: Diagnosis not present

## 2019-04-16 DIAGNOSIS — G8918 Other acute postprocedural pain: Secondary | ICD-10-CM | POA: Diagnosis not present

## 2019-04-16 DIAGNOSIS — M79604 Pain in right leg: Secondary | ICD-10-CM | POA: Diagnosis not present

## 2019-04-16 DIAGNOSIS — C4921 Malignant neoplasm of connective and soft tissue of right lower limb, including hip: Secondary | ICD-10-CM | POA: Diagnosis not present

## 2019-04-17 DIAGNOSIS — C4921 Malignant neoplasm of connective and soft tissue of right lower limb, including hip: Secondary | ICD-10-CM | POA: Diagnosis not present

## 2019-04-17 DIAGNOSIS — M79604 Pain in right leg: Secondary | ICD-10-CM | POA: Diagnosis not present

## 2019-04-28 ENCOUNTER — Observation Stay (HOSPITAL_COMMUNITY)
Admission: EM | Admit: 2019-04-28 | Discharge: 2019-04-29 | Disposition: A | Discharge status: Home / Self Care | Payer: Medicare Other | Attending: Internal Medicine | Admitting: Internal Medicine

## 2019-04-28 ENCOUNTER — Emergency Department (HOSPITAL_COMMUNITY): Payer: Medicare Other

## 2019-04-28 ENCOUNTER — Encounter (HOSPITAL_COMMUNITY): Payer: Self-pay

## 2019-04-28 ENCOUNTER — Other Ambulatory Visit: Payer: Self-pay

## 2019-04-28 DIAGNOSIS — R55 Syncope and collapse: Secondary | ICD-10-CM | POA: Diagnosis not present

## 2019-04-28 DIAGNOSIS — F4323 Adjustment disorder with mixed anxiety and depressed mood: Secondary | ICD-10-CM | POA: Diagnosis present

## 2019-04-28 DIAGNOSIS — S199XXA Unspecified injury of neck, initial encounter: Secondary | ICD-10-CM | POA: Diagnosis not present

## 2019-04-28 DIAGNOSIS — Z7982 Long term (current) use of aspirin: Secondary | ICD-10-CM | POA: Insufficient documentation

## 2019-04-28 DIAGNOSIS — W19XXXA Unspecified fall, initial encounter: Secondary | ICD-10-CM | POA: Diagnosis not present

## 2019-04-28 DIAGNOSIS — M8580 Other specified disorders of bone density and structure, unspecified site: Secondary | ICD-10-CM | POA: Diagnosis not present

## 2019-04-28 DIAGNOSIS — S0990XA Unspecified injury of head, initial encounter: Secondary | ICD-10-CM | POA: Diagnosis not present

## 2019-04-28 DIAGNOSIS — C4921 Malignant neoplasm of connective and soft tissue of right lower limb, including hip: Secondary | ICD-10-CM | POA: Diagnosis present

## 2019-04-28 DIAGNOSIS — I493 Ventricular premature depolarization: Secondary | ICD-10-CM | POA: Insufficient documentation

## 2019-04-28 DIAGNOSIS — S0003XA Contusion of scalp, initial encounter: Secondary | ICD-10-CM | POA: Insufficient documentation

## 2019-04-28 DIAGNOSIS — R000 Tachycardia, unspecified: Secondary | ICD-10-CM | POA: Insufficient documentation

## 2019-04-28 DIAGNOSIS — Z9889 Other specified postprocedural states: Secondary | ICD-10-CM | POA: Insufficient documentation

## 2019-04-28 DIAGNOSIS — G4700 Insomnia, unspecified: Secondary | ICD-10-CM | POA: Diagnosis not present

## 2019-04-28 DIAGNOSIS — I10 Essential (primary) hypertension: Secondary | ICD-10-CM | POA: Diagnosis not present

## 2019-04-28 DIAGNOSIS — Z79899 Other long term (current) drug therapy: Secondary | ICD-10-CM | POA: Diagnosis not present

## 2019-04-28 DIAGNOSIS — E039 Hypothyroidism, unspecified: Secondary | ICD-10-CM | POA: Diagnosis not present

## 2019-04-28 DIAGNOSIS — M1711 Unilateral primary osteoarthritis, right knee: Secondary | ICD-10-CM | POA: Insufficient documentation

## 2019-04-28 DIAGNOSIS — G894 Chronic pain syndrome: Secondary | ICD-10-CM | POA: Diagnosis present

## 2019-04-28 DIAGNOSIS — Z79890 Hormone replacement therapy (postmenopausal): Secondary | ICD-10-CM | POA: Diagnosis not present

## 2019-04-28 DIAGNOSIS — E785 Hyperlipidemia, unspecified: Secondary | ICD-10-CM | POA: Diagnosis not present

## 2019-04-28 DIAGNOSIS — Z03818 Encounter for observation for suspected exposure to other biological agents ruled out: Secondary | ICD-10-CM | POA: Diagnosis not present

## 2019-04-28 DIAGNOSIS — Z8249 Family history of ischemic heart disease and other diseases of the circulatory system: Secondary | ICD-10-CM | POA: Diagnosis not present

## 2019-04-28 DIAGNOSIS — Z1159 Encounter for screening for other viral diseases: Secondary | ICD-10-CM | POA: Diagnosis not present

## 2019-04-28 DIAGNOSIS — G609 Hereditary and idiopathic neuropathy, unspecified: Secondary | ICD-10-CM | POA: Insufficient documentation

## 2019-04-28 LAB — TROPONIN I: Troponin I: <0.03 ng/mL (ref <0.03)

## 2019-04-28 LAB — URINALYSIS, ROUTINE W REFLEX MICROSCOPIC
Bilirubin Urine: NEGATIVE
Glucose, UA: NEGATIVE mg/dL
Hgb urine dipstick: NEGATIVE
Ketones, ur: NEGATIVE mg/dL
Leukocytes,Ua: NEGATIVE
Nitrite: NEGATIVE
Protein, ur: NEGATIVE mg/dL
Specific Gravity, Urine: 1.013 (ref 1.005–1.030)
pH: 6 (ref 5.0–8.0)

## 2019-04-28 LAB — CBC WITH DIFFERENTIAL/PLATELET
Abs Immature Granulocytes: 0.12 10*3/uL — ABNORMAL HIGH (ref 0.00–0.07)
Basophils Absolute: 0.1 10*3/uL (ref 0.0–0.1)
Basophils Relative: 0 %
Eosinophils Absolute: 0.1 10*3/uL (ref 0.0–0.5)
Eosinophils Relative: 1 %
HCT: 29.5 % — ABNORMAL LOW (ref 36.0–46.0)
Hemoglobin: 9.4 g/dL — ABNORMAL LOW (ref 12.0–15.0)
Immature Granulocytes: 1 %
Lymphocytes Relative: 7 %
Lymphs Abs: 1.4 10*3/uL (ref 0.7–4.0)
MCH: 32.1 pg (ref 26.0–34.0)
MCHC: 31.9 g/dL (ref 30.0–36.0)
MCV: 100.7 fL — ABNORMAL HIGH (ref 80.0–100.0)
Monocytes Absolute: 1.5 10*3/uL — ABNORMAL HIGH (ref 0.1–1.0)
Monocytes Relative: 8 %
Neutro Abs: 15.2 10*3/uL — ABNORMAL HIGH (ref 1.7–7.7)
Neutrophils Relative %: 83 %
Platelets: 479 10*3/uL — ABNORMAL HIGH (ref 150–400)
RBC: 2.93 MIL/uL — ABNORMAL LOW (ref 3.87–5.11)
RDW: 14.7 % (ref 11.5–15.5)
WBC: 18.3 10*3/uL — ABNORMAL HIGH (ref 4.0–10.5)
nRBC: 0 % (ref 0.0–0.2)

## 2019-04-28 LAB — COMPREHENSIVE METABOLIC PANEL
ALT: 14 U/L (ref 0–44)
AST: 19 U/L (ref 15–41)
Albumin: 3.3 g/dL — ABNORMAL LOW (ref 3.5–5.0)
Alkaline Phosphatase: 50 U/L (ref 38–126)
Anion gap: 12 (ref 5–15)
BUN: 16 mg/dL (ref 8–23)
CO2: 21 mmol/L — ABNORMAL LOW (ref 22–32)
Calcium: 8.6 mg/dL — ABNORMAL LOW (ref 8.9–10.3)
Chloride: 98 mmol/L (ref 98–111)
Creatinine, Ser: 0.75 mg/dL (ref 0.44–1.00)
GFR calc Af Amer: >60 mL/min (ref >60)
GFR calc non Af Amer: >60 mL/min (ref >60)
Glucose, Bld: 104 mg/dL — ABNORMAL HIGH (ref 70–99)
Potassium: 4.5 mmol/L (ref 3.5–5.1)
Sodium: 131 mmol/L — ABNORMAL LOW (ref 135–145)
Total Bilirubin: 0.7 mg/dL (ref 0.3–1.2)
Total Protein: 6 g/dL — ABNORMAL LOW (ref 6.5–8.1)

## 2019-04-28 LAB — TSH: TSH: 4.654 u[IU]/mL — ABNORMAL HIGH (ref 0.350–4.500)

## 2019-04-28 LAB — SARS CORONAVIRUS 2 BY RT PCR (HOSPITAL ORDER, PERFORMED IN ~~LOC~~ HOSPITAL LAB): SARS Coronavirus 2: NEGATIVE

## 2019-04-28 LAB — SAMPLE TO BLOOD BANK

## 2019-04-28 MED ORDER — ACETAMINOPHEN 325 MG PO TABS: 650.0000 mg | ORAL_TABLET | Freq: Four times a day (QID) | ORAL | Status: DC | PRN

## 2019-04-28 MED ORDER — MIRABEGRON ER 50 MG PO TB24
50.0000 mg | ORAL_TABLET | Freq: Every day | ORAL | Status: DC
  Administered 2019-04-29: 50 mg via ORAL
  Filled 2019-04-28 (×3): qty 1

## 2019-04-28 MED ORDER — METHOCARBAMOL 500 MG PO TABS: 750.0000 mg | ORAL_TABLET | Freq: Two times a day (BID) | ORAL | Status: DC | PRN

## 2019-04-28 MED ORDER — ENOXAPARIN SODIUM 40 MG/0.4ML ~~LOC~~ SOLN
40.0000 mg | Freq: Every day | SUBCUTANEOUS | Status: DC
  Administered 2019-04-28: 40 mg via SUBCUTANEOUS
  Filled 2019-04-28: qty 0.4

## 2019-04-28 MED ORDER — TRAZODONE HCL 50 MG PO TABS: 50.0000 mg | ORAL_TABLET | ORAL | Status: DC | PRN

## 2019-04-28 MED ORDER — GABAPENTIN 300 MG PO CAPS
300.0000 mg | ORAL_CAPSULE | Freq: Every day | ORAL | Status: DC
  Administered 2019-04-29: 300 mg via ORAL
  Filled 2019-04-28: qty 1

## 2019-04-28 MED ORDER — METOPROLOL SUCCINATE ER 50 MG PO TB24
50.0000 mg | ORAL_TABLET | Freq: Every day | ORAL | Status: DC
  Administered 2019-04-29: 50 mg via ORAL
  Filled 2019-04-28: qty 1

## 2019-04-28 MED ORDER — ASPIRIN EC 81 MG PO TBEC
81.0000 mg | DELAYED_RELEASE_TABLET | Freq: Two times a day (BID) | ORAL | Status: DC
  Administered 2019-04-28 – 2019-04-29 (×2): 81 mg via ORAL
  Filled 2019-04-28 (×2): qty 1

## 2019-04-28 MED ORDER — ACETAMINOPHEN 650 MG RE SUPP: 650.0000 mg | Freq: Four times a day (QID) | RECTAL | Status: DC | PRN

## 2019-04-28 MED ORDER — HYDROCODONE-ACETAMINOPHEN 5-325 MG PO TABS
1.0000 | ORAL_TABLET | ORAL | Status: DC | PRN
  Administered 2019-04-28 – 2019-04-29 (×2): 1 via ORAL
  Filled 2019-04-28 (×2): qty 1

## 2019-04-28 MED ORDER — GABAPENTIN 300 MG PO CAPS
900.0000 mg | ORAL_CAPSULE | Freq: Every day | ORAL | Status: DC
  Administered 2019-04-28: 900 mg via ORAL
  Filled 2019-04-28: qty 3

## 2019-04-28 MED ORDER — NITROFURANTOIN MONOHYD MACRO 100 MG PO CAPS
100.0000 mg | ORAL_CAPSULE | Freq: Every day | ORAL | Status: DC
  Administered 2019-04-29: 100 mg via ORAL
  Filled 2019-04-28: qty 1

## 2019-04-28 MED ORDER — IPRATROPIUM BROMIDE 0.06 % NA SOLN
2.0000 | Freq: Two times a day (BID) | NASAL | Status: DC
  Administered 2019-04-29 (×2): 2 via NASAL
  Filled 2019-04-28: qty 15

## 2019-04-28 MED ORDER — ONDANSETRON HCL 4 MG/2ML IJ SOLN: 4.0000 mg | Freq: Four times a day (QID) | INTRAMUSCULAR | Status: DC | PRN

## 2019-04-28 MED ORDER — METOPROLOL SUCCINATE ER 25 MG PO TB24: 50.0000 mg | ORAL_TABLET | Freq: Two times a day (BID) | ORAL | Status: DC

## 2019-04-28 MED ORDER — SODIUM CHLORIDE 0.9% FLUSH
3.0000 mL | Freq: Two times a day (BID) | INTRAVENOUS | Status: DC
  Administered 2019-04-28 – 2019-04-29 (×2): 3 mL via INTRAVENOUS

## 2019-04-28 MED ORDER — HYDROCODONE-ACETAMINOPHEN 5-325 MG PO TABS
1.0000 | ORAL_TABLET | Freq: Once | ORAL | Status: AC
  Administered 2019-04-28: 1 via ORAL
  Filled 2019-04-28: qty 1

## 2019-04-28 MED ORDER — ZOLPIDEM TARTRATE 5 MG PO TABS
5.0000 mg | ORAL_TABLET | Freq: Every evening | ORAL | Status: DC | PRN
  Administered 2019-04-28: 5 mg via ORAL
  Filled 2019-04-28: qty 1

## 2019-04-28 MED ORDER — LACTATED RINGERS IV SOLN
INTRAVENOUS | Status: DC
  Administered 2019-04-28 – 2019-04-29 (×2): via INTRAVENOUS

## 2019-04-28 MED ORDER — SENNOSIDES-DOCUSATE SODIUM 8.6-50 MG PO TABS
2.0000 | ORAL_TABLET | Freq: Two times a day (BID) | ORAL | Status: DC
  Administered 2019-04-28 – 2019-04-29 (×2): 2 via ORAL
  Filled 2019-04-28 (×2): qty 2

## 2019-04-28 MED ORDER — LEVOTHYROXINE SODIUM 25 MCG PO TABS
125.0000 ug | ORAL_TABLET | Freq: Every day | ORAL | Status: DC
  Administered 2019-04-29: 125 ug via ORAL
  Filled 2019-04-28: qty 1

## 2019-04-28 MED ORDER — ONDANSETRON HCL 4 MG PO TABS: 4.0000 mg | ORAL_TABLET | Freq: Four times a day (QID) | ORAL | Status: DC | PRN

## 2019-04-28 NOTE — ED Notes (Signed)
Attempted report. Unable to give.

## 2019-04-28 NOTE — H&P (Signed)
History and Physical    Destiny [name] OVF:[id number] DOB: [birth year] DOA: 04/28/2019  PCP: Deland Pretty, MD Consultants:  [facility] - orthopedics Patient coming from:  Home - lives with husband; NOK: Husband, [phone]  Chief Complaint: Fall  HPI: Destiny [name] is a 78 y.o. female with medical history significant of hypothyroidism and HTN presenting with a fall. She was in the process of making coffee this AM.  She felt faint and the room was spinning.  She hit her head on the counter and slumped onto the kitchen floor.  She did not lose consciousness.  It took her "a while" to be able to get up even with help.  She went from feeling dizzy to feeling nauseated.  She did not have breakfast before this happened.  Her husband brought her coffee and she started feeling better then.  She took a pain pill at 730 and went to bed for about 45 minutes prior to this episode.  Now, she is feeling very "upset with the whole day and a little spacey."  She has felt more sleepy since before surgery, more tired and sleeping more.  She lacks energy.  She does not yet know if she will need further therapy - she is waiting on the margins to come back.  She had h/o B knee replacement.  She feels like she needs assistance at home and her husband is unable to help.  She had a R thigh soft tissue mass biopsy/aspiration on 5/11.  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  She then underwent radical resection of the soft tissue tumor of the thigh and knee on 5/22 at [facility].  ED Course:  Syncopal event today.  No provoking factors.  Got light-headed, dizzy, fell and struck her head; she was fully unconscious.  Not anemic.  Has a drain in place.  WBC 15 -> 18.   She is having PVCs.  Would like to obs overnight.  Review of Systems: As per HPI; otherwise review of systems reviewed and negative.   Ambulatory Status:  Ambulates without assistance or with a walker since the surgery  Past  Medical History:  Diagnosis Date  . Anxiety   . Arthritis   . Colon polyps   . Complication of anesthesia    BP drops with fentyl and versed  . Hepatitis    hep A  1976  . Hypertension   . Hypothyroidism   . Liposarcoma of right thigh ([place]) 03/2019    Past Surgical History:  Procedure Laterality Date  . ABDOMINAL HYSTERECTOMY    . APPENDECTOMY    . BACK SURGERY      fusion  . CESAREAN SECTION     times 2  . EYE SURGERY Left    cataract removed  . FINGER ARTHRODESIS Left 10/02/2017   Procedure: LEFT LONG FINGER ARTHRODESIS;  Surgeon: Milly Jakob, MD;  Location: [facility];  Service: Orthopedics;  Laterality: Left;  . FOOT SURGERY Bilateral   . FRACTURE SURGERY     broken ankle  . MEDIAL PARTIAL KNEE REPLACEMENT Left    done at [facility] Right 12/28/2014   Procedure: REVERSE SHOULDER ARTHROPLASTY;  Surgeon: Nita Sells, MD;  Location: [facility];  Service: Orthopedics;  Laterality: Right;  Right reverse total shoulder arthroplasty  . REVERSE TOTAL SHOULDER ARTHROPLASTY Right 12/28/2014   DR CHANDLER  . rotator cufff    . THYROIDECTOMY, PARTIAL     one lobe  removed  . VARICOSE VEIN SURGERY Right     Social History   Socioeconomic History  . Marital status: Married    Spouse name: Not on file  . Number of children: Not on file  . Years of education: Not on file  . Highest education level: Not on file  Occupational History  . Not on file  Social Needs  . Financial resource strain: Not on file  . Food insecurity:    Worry: Not on file    Inability: Not on file  . Transportation needs:    Medical: Not on file    Non-medical: Not on file  Tobacco Use  . Smoking status: Never Smoker  . Smokeless tobacco: Never Used  Substance and Sexual Activity  . Alcohol use: Yes    Comment: rarely  . Drug use: No  . Sexual activity: Not on file  Lifestyle  . Physical activity:    Days per week: Not on file    Minutes  per session: Not on file  . Stress: Not on file  Relationships  . Social connections:    Talks on phone: Not on file    Gets together: Not on file    Attends religious service: Not on file    Active member of club or organization: Not on file    Attends meetings of clubs or organizations: Not on file    Relationship status: Not on file  . Intimate partner violence:    Fear of current or ex partner: Not on file    Emotionally abused: Not on file    Physically abused: Not on file    Forced sexual activity: Not on file  Other Topics Concern  . Not on file  Social History Narrative  . Not on file    Allergies  Allergen Reactions  . Nsaids Other (See Comments)     BRUISES EASILY Other Reaction: BRUISES EASILY Other Reaction: BRUISES EASILY   . Oxycodone-Acetaminophen Other (See Comments)     VERY CONSTIPATING, does not provide pain relief  . Aspirin Other (See Comments)    Bruising   . Augmentin [Amoxicillin-Pot Clavulanate] Other (See Comments)    headache  . Ciprocin-Fluocin-Procin [Fluocinolone]   . Ciprofloxacin Hcl     tendonitis  . Fentanyl Other (See Comments)    Does not work  . Fluclorolone Other (See Comments)  . Lyrica [Pregabalin] Other (See Comments)    dysphoria  . Oxycodone Other (See Comments)    Does not work  . Versed [Midazolam] Other (See Comments)    Does not work  . Diclofenac Sodium Other (See Comments) and Rash    Skin peeling. Voltaren gel-rash and skin peeling   . Hydromorphone Hcl Itching     Itching with large doses - able to tolerate lower doses or shorter duration  . Voltaren Gel [Diclofenac Sodium] Rash and Other (See Comments)    Skin peeling.    Family History  Problem Relation Age of Onset  . Heart disease Mother   . Atrial fibrillation Mother   . Cancer - Other Father     Prior to Admission medications   Medication Sig Start Date End Date Taking? Authorizing Provider  acetaminophen (TYLENOL) 500 MG tablet Take 500 mg by  mouth every 4 (four) hours as needed.   Yes [provider]  amLODipine (NORVASC) 5 MG tablet Take 5 mg by mouth daily.    Yes [provider]  aspirin EC 81 MG tablet Take 81 mg  by mouth 2 (two) times a day.   Yes [provider]  b complex vitamins capsule Take 1 capsule by mouth daily.   Yes [provider]  Calcium Carb-Cholecalciferol 600-200 MG-UNIT TABS Take 1 tablet by mouth daily.    Yes [provider]  cholecalciferol (VITAMIN D) 1000 UNITS tablet Take 2,000 Units by mouth daily.   Yes [provider]  ferrous sulfate 325 (65 FE) MG tablet Take 325 mg by mouth daily with breakfast.   Yes [provider]  gabapentin (NEURONTIN) 300 MG capsule Take 600-900 mg by mouth See admin instructions. Take 300 mg every morning and900 mg at bedtime   Yes [provider]  HYDROcodone-acetaminophen (NORCO/VICODIN) 5-325 MG tablet Take 1 tablet by mouth every 4 (four) hours as needed for moderate pain or severe pain.  03/06/14  Yes [provider]  ipratropium (ATROVENT) 0.06 % nasal spray Place 1 spray into the nose as needed. 09/30/16  Yes [provider]  levothyroxine (SYNTHROID, LEVOTHROID) 125 MCG tablet Take 125 mcg by mouth daily before breakfast.    Yes [provider]  loratadine-pseudoephedrine (CLARITIN-D 12-HOUR) 5-120 MG per tablet Take 1 tablet by mouth daily.   Yes [provider]  methocarbamol (ROBAXIN) 750 MG tablet Take 750 mg by mouth 2 (two) times daily as needed for muscle spasms.  06/02/11  Yes [provider]  metoprolol succinate (TOPROL-XL) 50 MG 24 hr tablet Take 50 mg by mouth 2 (two) times daily.    Yes [provider]  MYRBETRIQ 50 MG TB24 tablet Take 50 mg by mouth at bedtime. 02/10/19  Yes [provider]  nitrofurantoin, macrocrystal-monohydrate, (MACROBID) 100 MG capsule Take 100 mg by mouth daily.    Yes [provider]  olmesartan  (BENICAR) 40 MG tablet Take 40 mg by mouth daily. 01/08/19  Yes [provider]  senna-docusate (SENOKOT-S) 8.6-50 MG tablet Take 2 tablets by mouth 2 (two) times a day. 04/16/19 04/15/20 Yes [provider]  Marnee Guarneri Isoflavones (SOY BALANCE PO) Take 1 tablet by mouth daily. Soy bean extract   Yes [provider]  tolterodine (DETROL LA) 4 MG 24 hr capsule Take 4 mg by mouth daily. 11/01/18  Yes [provider]  traZODone (DESYREL) 50 MG tablet Take 50 mg by mouth as needed. 02/23/19  Yes [provider]  zolpidem (AMBIEN) 10 MG tablet Take 5 mg by mouth at bedtime as needed for sleep.  09/30/14  Yes [provider]    Physical Exam: Vitals:   04/28/19 1615 04/28/19 1630 04/28/19 1645 04/28/19 1700  BP: 113/86 (!) 108/45 (!) 127/54 (!) 120/53  Pulse: 68 70 66 65  Resp: 14 20 (!) 23 17  Temp:      TempSrc:      SpO2: 100% 97% 97% 94%  Weight:      Height:         . General:  Appears calm and comfortable and is NAD . Eyes:  PERRL, EOMI, normal lids, iris . ENT:  grossly normal hearing, lips & tongue, mmm . Neck:  no LAD, masses or thyromegaly . Cardiovascular:  RRR, no m/r/g. No LE edema.  Marland Kitchen Respiratory:   CTA bilaterally with no wheezes/rales/rhonchi.  Normal respiratory effort. . Abdomen:  soft, NT, ND, NABS . Back:   normal alignment, no CVAT . Skin:  Small scalp hematoma on left forehead . Musculoskeletal:  R medial thigh with a large dressing and drain in place, serous drainage  filling bulb . Psychiatric:  Depressed/anxious mood and affect, speech fluent and appropriate, AOx3, intermittently emotionally labile.  She became quite hystrionic about the COVID nasal swab with extreme emotional lability. . Neurologic:  CN 2-12 grossly intact, moves all extremities in coordinated fashion    Radiological Exams on Admission: Ct Head Wo Contrast  Result Date: 04/28/2019 CLINICAL DATA:  Syncopal episode resulting in a fall, hitting the  front of her head. EXAM: CT HEAD WITHOUT CONTRAST CT CERVICAL SPINE WITHOUT CONTRAST TECHNIQUE: Multidetector CT imaging of the head and cervical spine was performed following the standard protocol without intravenous contrast. Multiplanar CT image reconstructions of the cervical spine were also generated. COMPARISON:  Cervical spine MR dated 10/25/2018. FINDINGS: CT HEAD FINDINGS Brain: Mild-to-moderate enlargement of the ventricles and subarachnoid spaces. Mild-to-moderate patchy white matter low density in both cerebral hemispheres. No intracranial hemorrhage, mass lesion or CT evidence of acute infarction. Vascular: No hyperdense vessel or unexpected calcification. Skull: Normal. Negative for fracture or focal lesion. Sinuses/Orbits: Unremarkable. Other: Left frontal scalp hematoma. CT CERVICAL SPINE FINDINGS Alignment: Mild levoconvex cervicothoracic scoliosis. No subluxations. Skull base and vertebrae: No acute fracture. No primary bone lesion or focal pathologic process. Soft tissues and spinal canal: No prevertebral fluid or swelling. No visible canal hematoma. Disc levels:  Mild-to-moderate multilevel degenerative changes. Upper chest: Clear lung apices. Other: None. IMPRESSION: 1. Left frontal scalp hematoma without skull fracture or intracranial hemorrhage. 2. No cervical spine fracture or subluxation. 3. Mild to moderate diffuse cerebral and cerebellar atrophy. 4. Mild-to-moderate chronic small vessel white matter ischemic changes in both cerebral hemispheres. 5. Mild to moderate multilevel cervical spine degenerative changes. Electronically Signed   By: Claudie Revering M.D.   On: 04/28/2019 14:04   Ct Cervical Spine Wo Contrast  Result Date: 04/28/2019 CLINICAL DATA:  Syncopal episode resulting in a fall, hitting the front of her head. EXAM: CT HEAD WITHOUT CONTRAST CT CERVICAL SPINE WITHOUT CONTRAST TECHNIQUE: Multidetector CT imaging of the head and cervical spine was performed following the standard  protocol without intravenous contrast. Multiplanar CT image reconstructions of the cervical spine were also generated. COMPARISON:  Cervical spine MR dated 10/25/2018. FINDINGS: CT HEAD FINDINGS Brain: Mild-to-moderate enlargement of the ventricles and subarachnoid spaces. Mild-to-moderate patchy white matter low density in both cerebral hemispheres. No intracranial hemorrhage, mass lesion or CT evidence of acute infarction. Vascular: No hyperdense vessel or unexpected calcification. Skull: Normal. Negative for fracture or focal lesion. Sinuses/Orbits: Unremarkable. Other: Left frontal scalp hematoma. CT CERVICAL SPINE FINDINGS Alignment: Mild levoconvex cervicothoracic scoliosis. No subluxations. Skull base and vertebrae: No acute fracture. No primary bone lesion or focal pathologic process. Soft tissues and spinal canal: No prevertebral fluid or swelling. No visible canal hematoma. Disc levels:  Mild-to-moderate multilevel degenerative changes. Upper chest: Clear lung apices. Other: None. IMPRESSION: 1. Left frontal scalp hematoma without skull fracture or intracranial hemorrhage. 2. No cervical spine fracture or subluxation. 3. Mild to moderate diffuse cerebral and cerebellar atrophy. 4. Mild-to-moderate chronic small vessel white matter ischemic changes in both cerebral hemispheres. 5. Mild to moderate multilevel cervical spine degenerative changes. Electronically Signed   By: Claudie Revering M.D.   On: 04/28/2019 14:04    EKG: Independently reviewed.  NSR with rate 72; nonspecific ST changes with no evidence of acute ischemia   Labs on Admission: I have personally reviewed the available labs and imaging studies at the time of the admission.  Pertinent labs:   Na++ 131 CO2 21 Glucose 104 Troponin <0.03 WBC 18.3  Hgb 9.4 Platelets 479 UA WNL COVID negative on 5/19; retested today  Assessment/Plan Principal Problem:   Near syncope Active Problems:   Hypothyroidism   Chronic pain syndrome    Essential hypertension   Adjustment disorder with mixed anxiety and depressed mood   Liposarcoma of right thigh (HCC)   Near syncope -The patient presents with a near syncope episode this AM -She did not experience LOC -She reports taking a pain pill and going back to bed about 45 minutes prior; had not had anything to eat or drink; and is taking 2 medications with anticholinergic properties -This appears to be vasovagal in nature at this time -Will monitor on telemetry overnight -Orthostatic vital signs now and in AM -Neuro checks  -PT/OT eval and treat; the patient reports that she really needs home health services including PT and that her husband has been adamant about not letting anyone inside their home due to the current COVID crisis.  She is clearly stressed about this.  At the time of hospital d/c on 5/20, she was noted to need near 24/7 assist with recommendation for home health PT.  Adjustment d/o -She exhibited mood lability while I was present and was hysterical about the COVID nasal swab, eventually yanked it out of her nose sobbing -She also became emotionally labile when discussing her husband's unwillingness to allow her to have in-home help (even their housekeeper) -She has been under a lot of pressure with her recent cancer diagnosis and the COVID crisis -She may benefit from inpatient vs. Outpatient psych support  Liposarcoma -She reports that surgical pathology from her May surgery is pending and will determine further treatment course -I do not see pathology reports currently pending -She also reports that her wound drain is leaking; will request wound care consultation.  Chronic pain -Continue home Vicodin -I have reviewed this patient in the [place] Controlled Substances Reporting System.  She is receiving medications from multiple providers but appears to be taking them as prescribed. -She is not at particularly high risk of opioid misuse, diversion, or  overdose.  HTN -Hold BP medications for now (norvasc, Benicar) other than Toprol XL; decrease Toprol XL to once daily for now given concern for orthostasis and relatively low BP in the ER  Hypothyroidism -Check TSH -Continue Synthroid at current dose for now    Note: This patient has been tested and is negative for the novel coronavirus COVID-19.  DVT prophylaxis:  Lovenox Code Status:  DNR - confirmed with patient; this was a difficult question for her to answer since her husband feels differently, but she clearly stated she has a living will that states that she desires to have a natural death and would not want resuscitation Family Communication: None present Disposition Plan:  Home once clinically improved Consults called: CM, PT, OT  Admission status: It is my clinical opinion that referral for OBSERVATION is reasonable and necessary in this patient based on the above information provided. The aforementioned taken together are felt to place the patient at high risk for further clinical deterioration. However it is anticipated that the patient may be medically stable for discharge from the hospital within 24 to 48 hours.    Karmen Bongo MD [facility]   How to contact the [facility] Attending or Consulting provider [place] or covering provider during after hours [place], for this patient?  1. Check the care team in [facility] and look for a) attending/consulting [facility] provider listed and b) the [facility] team listed  2. Log into [email or web address] and use [place]'s universal password to access. If you do not have the password, please contact the hospital operator. 3. Locate the [facility] provider you are looking for under [facility] and page to a number that you can be directly reached. 4. If you still have difficulty reaching the provider, please page the [facility] (Director on Call) for the Hospitalists listed on amion for assistance.   04/28/2019, 6:11 PM

## 2019-04-28 NOTE — ED Notes (Signed)
Attempted to obtain blood by RN, unable to do so w/ IV. EDT in to attempt, unable to find site.Phleb contacted to attempt

## 2019-04-28 NOTE — ED Triage Notes (Signed)
Pt arrive POV for eval of fall s/p R leg surgery for tumor removal 2 weeks prior. Pt reports that she is normally anemic, and she believes that may be the reason she fell. Pt reports she "faded and then fell" striking her head. Pt denies anticoagulation, states she is one baby ASA QD.

## 2019-04-28 NOTE — ED Provider Notes (Signed)
[facility] EMERGENCY DEPARTMENT Provider Note   CSN: [id number] Arrival date & time: 04/28/19  1148    History   Chief Complaint Chief Complaint  Patient presents with  . Fall    HPI Destiny [name] is a 78 y.o. 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 Redmond Pulling on 04/15/2019 who presents today for evaluation after a fall.  She reports that she has been been feeling okay and today she stood up after which she started feeling lightheaded and dizzy and like she was going to pass out.  She did pass out striking her forehead on a table.  She does not take any blood thinning medications.  She denies any new pain in her neck.  She is concerned that she may be anemic given that her hemoglobin was 9 before her surgery.  She reports that she continues to have large amounts of drainage from her drain from her tumor resection.  She reports that it is frequently being emptied.  She denies any dysuria, increased urgency or frequency.  She reports that she feels like she is currently having slight trouble thinking.  She reports that her last bowel movement was 3 days ago.  She reports that her redness around her incision is much improved.        HPI  Past Medical History:  Diagnosis Date  . Anxiety   . Arthritis   . Colon polyps   . Complication of anesthesia    BP drops with fentyl and versed  . Hepatitis    hep A  1976  . Hypertension   . Hypothyroidism     Patient Active Problem List   Diagnosis Date Noted  . Osteoarthritis of right knee 03/10/2019  . Hip pain, bilateral 01/07/2018  . Sinus tarsi syndrome 08/20/2016  . Right knee pain 07/17/2016  . S/p reverse total shoulder arthroplasty 12/28/2014  . Abnormality of gait 04/25/2014  . Foot pain 06/03/2011  . Metatarsalgia 06/03/2011  . CHRONIC PAIN SYNDROME 12/05/2009  . INSOMNIA 08/15/2009  .  INGROWN TOENAIL 06/06/2009  . LEG PAIN, BILATERAL 06/05/2009  . OTHER VITAMIN B12 DEFICIENCY ANEMIA 04/04/2009  . MALAISE AND FATIGUE 02/28/2009  . UNSPECIFIED HYPOTHYROIDISM 07/26/2008  . HYPERLIPIDEMIA 11/05/2007  . NEUROPATHY, IDIOPATHIC PERIPHERAL NEC 09/16/2007  . HYPERTENSION 01/28/2007  . Arthritis of hand, degenerative 01/28/2007  . ROTATOR CUFF INJURY, RIGHT SHOULDER 01/28/2007  . OSTEOPENIA 01/28/2007    Past Surgical History:  Procedure Laterality Date  . ABDOMINAL HYSTERECTOMY    . APPENDECTOMY    . BACK SURGERY      fusion  . CESAREAN SECTION     times 2  . EYE SURGERY Left    cataract removed  . FINGER ARTHRODESIS Left 10/02/2017   Procedure: LEFT LONG FINGER ARTHRODESIS;  Surgeon: Milly Jakob, MD;  Location: [facility];  Service: Orthopedics;  Laterality: Left;  . FOOT SURGERY Bilateral   . FRACTURE SURGERY     broken ankle  . MEDIAL PARTIAL KNEE REPLACEMENT Left    done at [place] Right 12/28/2014   Procedure: REVERSE SHOULDER ARTHROPLASTY;  Surgeon: Nita Sells, MD;  Location: [facility];  Service: Orthopedics;  Laterality: Right;  Right reverse total shoulder arthroplasty  . REVERSE TOTAL SHOULDER ARTHROPLASTY Right 12/28/2014   DR CHANDLER  . rotator cufff    . THYROIDECTOMY, PARTIAL  one lobe removed  . VARICOSE VEIN SURGERY Right      OB History   No obstetric history on file.      Home Medications    Prior to Admission medications   Medication Sig Start Date End Date Taking? Authorizing Provider  acetaminophen (TYLENOL) 500 MG tablet Take 500 mg by mouth every 4 (four) hours as needed.   Yes [provider]  amLODipine (NORVASC) 5 MG tablet Take 5 mg by mouth daily.    Yes [provider]  aspirin EC 81 MG tablet Take 81 mg by mouth 2 (two) times a day.   Yes [provider]  b complex vitamins capsule Take 1 capsule by mouth daily.   Yes [provider]  Calcium Carb-Cholecalciferol 600-200 MG-UNIT TABS Take 1 tablet by mouth daily.    Yes [provider]  cholecalciferol (VITAMIN D) 1000 UNITS tablet Take 2,000 Units by mouth daily.   Yes [provider]  ferrous sulfate 325 (65 FE) MG tablet Take 325 mg by mouth daily with breakfast.   Yes [provider]  gabapentin (NEURONTIN) 300 MG capsule Take 600-900 mg by mouth See admin instructions. Take 300 mg every morning and900 mg at bedtime   Yes [provider]  HYDROcodone-acetaminophen (NORCO/VICODIN) 5-325 MG tablet Take 1 tablet by mouth every 4 (four) hours as needed for moderate pain or severe pain.  03/06/14  Yes [provider]  ipratropium (ATROVENT) 0.06 % nasal spray Place 1 spray into the nose as needed. 09/30/16  Yes [provider]  levothyroxine (SYNTHROID, LEVOTHROID) 125 MCG tablet Take 125 mcg by mouth daily before breakfast.    Yes [provider]  loratadine-pseudoephedrine (CLARITIN-D 12-HOUR) 5-120 MG per tablet Take 1 tablet by mouth daily.   Yes [provider]  methocarbamol (ROBAXIN) 750 MG tablet Take 750 mg by mouth 2 (two) times daily as needed for muscle spasms.  06/02/11  Yes [provider]  metoprolol succinate (TOPROL-XL) 50 MG 24 hr tablet Take 50 mg by mouth 2 (two) times daily.    Yes [provider]  MYRBETRIQ 50 MG TB24 tablet Take 50 mg by mouth at bedtime. 02/10/19  Yes [provider]  nitrofurantoin, macrocrystal-monohydrate, (MACROBID) 100 MG capsule Take 100 mg by mouth daily.    Yes [provider]  olmesartan (BENICAR) 40 MG tablet Take 40 mg by mouth daily. 01/08/19  Yes [provider]  senna-docusate (SENOKOT-S) 8.6-50 MG tablet Take 2 tablets by mouth 2 (two) times a day. 04/16/19 04/15/20 Yes [provider]  Marnee Guarneri Isoflavones (SOY BALANCE PO) Take 1 tablet by mouth daily. Soy bean extract   Yes [provider]  tolterodine (DETROL LA) 4 MG 24 hr capsule Take 4 mg by mouth daily. 11/01/18  Yes [provider]  traZODone (DESYREL) 50 MG tablet Take 50 mg by mouth as needed. 02/23/19  Yes [provider]  zolpidem (AMBIEN) 10 MG tablet Take 5 mg by mouth at bedtime as needed for sleep.  09/30/14  Yes [provider]    Family History Family History  Problem Relation Age of Onset  . Heart disease Mother   . Atrial fibrillation Mother   . Cancer - Other Father     Social History Social History   Tobacco Use  . Smoking status: Never Smoker  . Smokeless tobacco: Never Used  Substance Use Topics  . Alcohol use: Yes    Comment: rarely  .  Drug use: No     Allergies   Nsaids; Oxycodone-acetaminophen; Aspirin; Augmentin [amoxicillin-pot clavulanate]; Ciprocin-fluocin-procin [fluocinolone]; Ciprofloxacin hcl; Fentanyl; Fluclorolone; Lyrica [pregabalin]; Oxycodone; Versed [midazolam]; Diclofenac sodium; Hydromorphone hcl; and Voltaren gel [diclofenac sodium]   Review of Systems Review of Systems  Constitutional: Negative for chills and fever.  HENT: Negative for congestion.   Respiratory: Negative for chest tightness and shortness of breath.   Gastrointestinal: Negative for abdominal pain, constipation (Unchanged from baseline BM. ), diarrhea, nausea and vomiting.  Musculoskeletal: Positive for neck pain (Chronic, unchanged). Negative for back pain.  Skin: Positive for wound. Negative for color change.  Neurological: Positive for syncope and light-headedness.  Psychiatric/Behavioral: Negative for confusion.  All other systems reviewed and are negative.    Physical Exam Updated Vital Signs BP (!) 130/52   Pulse 66   Temp 98.5 F (36.9 C) (Oral)   Resp (!) 25   Ht 5\' 1"  (1.549 m)   Wt 69.4 kg   SpO2 100%   BMI 28.91 kg/m   Physical Exam Vitals signs and nursing note reviewed.  Constitutional:      General: She is not in acute distress.     Appearance: She is well-developed. She is not diaphoretic.  HENT:     Head: Normocephalic.     Comments: 3cm hematoma left anterior forehead.     Nose: Nose normal.     Mouth/Throat:     Mouth: Mucous membranes are moist.  Eyes:     General: No scleral icterus.       Right eye: No discharge.        Left eye: No discharge.     Conjunctiva/sclera: Conjunctivae normal.  Neck:     Comments: ROM not tested.  Cardiovascular:     Rate and Rhythm: Normal rate and regular rhythm.     Pulses: Normal pulses.     Heart sounds: Normal heart sounds.  Pulmonary:     Effort: Pulmonary effort is normal. No respiratory distress.     Breath sounds: Normal breath sounds. No stridor.  Abdominal:     General: Abdomen is flat. Bowel sounds are normal. There is no distension.     Tenderness: There is no abdominal tenderness. There is no guarding.  Musculoskeletal:        General: No deformity.     Right lower leg: Edema present.     Left lower leg: No edema.     Comments: RLE Drain in place.  Dressing has mild leakage from where occlusive dressing has pealed up.  There is approx 100cc of serous fluid in the drain with small amount of blood in the tubing.  Mild redness anterior to dressing.   Skin:    General: Skin is warm and dry.     Coloration: Skin is pale.  Neurological:     General: No focal deficit present.     Mental Status: She is alert and oriented to person, place, and time. Mental status is at baseline.     Motor: No abnormal muscle tone.  Psychiatric:        Mood and Affect: Mood normal.        Behavior: Behavior normal.      ED Treatments / Results  Labs (all labs ordered are listed, but only abnormal results are displayed) Labs Reviewed  COMPREHENSIVE METABOLIC PANEL - Abnormal; Notable for the following components:      Result Value   Sodium 131 (*)    CO2 21 (*)  Glucose, Bld 104 (*)    Calcium 8.6 (*)    Total Protein 6.0 (*)    Albumin 3.3 (*)    All other  components within normal limits  CBC WITH DIFFERENTIAL/PLATELET - Abnormal; Notable for the following components:   WBC 18.3 (*)    RBC 2.93 (*)    Hemoglobin 9.4 (*)    HCT 29.5 (*)    MCV 100.7 (*)    Platelets 479 (*)    Neutro Abs 15.2 (*)    Monocytes Absolute 1.5 (*)    Abs Immature Granulocytes 0.12 (*)    All other components within normal limits  SARS CORONAVIRUS 2 (HOSPITAL ORDER, [facility])  URINALYSIS, ROUTINE W REFLEX MICROSCOPIC  TROPONIN I  SAMPLE TO BLOOD BANK    EKG EKG Interpretation  Date/Time:  Thursday April 28 2019 12:04:59 EDT Ventricular Rate:  72 PR Interval:    QRS Duration: 93 QT Interval:  395 QTC Calculation: 433 R Axis:   57 Text Interpretation:  Sinus tachycardia Multiform ventricular premature complexes Short PR interval Otherwise no significant change Confirmed by Deno Etienne [phone]) on 04/28/2019 1:07:40 PM Also confirmed by Deno Etienne [phone]), editor Hattie Perch ([id number])  on 04/28/2019 2:17:13 PM   Radiology Ct Head Wo Contrast  Result Date: 04/28/2019 CLINICAL DATA:  Syncopal episode resulting in a fall, hitting the front of her head. EXAM: CT HEAD WITHOUT CONTRAST CT CERVICAL SPINE WITHOUT CONTRAST TECHNIQUE: Multidetector CT imaging of the head and cervical spine was performed following the standard protocol without intravenous contrast. Multiplanar CT image reconstructions of the cervical spine were also generated. COMPARISON:  Cervical spine MR dated 10/25/2018. FINDINGS: CT HEAD FINDINGS Brain: Mild-to-moderate enlargement of the ventricles and subarachnoid spaces. Mild-to-moderate patchy white matter low density in both cerebral hemispheres. No intracranial hemorrhage, mass lesion or CT evidence of acute infarction. Vascular: No hyperdense vessel or unexpected calcification. Skull: Normal. Negative for fracture or focal lesion. Sinuses/Orbits: Unremarkable. Other: Left frontal scalp hematoma. CT CERVICAL SPINE  FINDINGS Alignment: Mild levoconvex cervicothoracic scoliosis. No subluxations. Skull base and vertebrae: No acute fracture. No primary bone lesion or focal pathologic process. Soft tissues and spinal canal: No prevertebral fluid or swelling. No visible canal hematoma. Disc levels:  Mild-to-moderate multilevel degenerative changes. Upper chest: Clear lung apices. Other: None. IMPRESSION: 1. Left frontal scalp hematoma without skull fracture or intracranial hemorrhage. 2. No cervical spine fracture or subluxation. 3. Mild to moderate diffuse cerebral and cerebellar atrophy. 4. Mild-to-moderate chronic small vessel white matter ischemic changes in both cerebral hemispheres. 5. Mild to moderate multilevel cervical spine degenerative changes. Electronically Signed   By: Claudie Revering M.D.   On: 04/28/2019 14:04   Ct Cervical Spine Wo Contrast  Result Date: 04/28/2019 CLINICAL DATA:  Syncopal episode resulting in a fall, hitting the front of her head. EXAM: CT HEAD WITHOUT CONTRAST CT CERVICAL SPINE WITHOUT CONTRAST TECHNIQUE: Multidetector CT imaging of the head and cervical spine was performed following the standard protocol without intravenous contrast. Multiplanar CT image reconstructions of the cervical spine were also generated. COMPARISON:  Cervical spine MR dated 10/25/2018. FINDINGS: CT HEAD FINDINGS Brain: Mild-to-moderate enlargement of the ventricles and subarachnoid spaces. Mild-to-moderate patchy white matter low density in both cerebral hemispheres. No intracranial hemorrhage, mass lesion or CT evidence of acute infarction. Vascular: No hyperdense vessel or unexpected calcification. Skull: Normal. Negative for fracture or focal lesion. Sinuses/Orbits: Unremarkable. Other: Left frontal scalp hematoma. CT CERVICAL SPINE FINDINGS Alignment: Mild levoconvex cervicothoracic  scoliosis. No subluxations. Skull base and vertebrae: No acute fracture. No primary bone lesion or focal pathologic process. Soft tissues  and spinal canal: No prevertebral fluid or swelling. No visible canal hematoma. Disc levels:  Mild-to-moderate multilevel degenerative changes. Upper chest: Clear lung apices. Other: None. IMPRESSION: 1. Left frontal scalp hematoma without skull fracture or intracranial hemorrhage. 2. No cervical spine fracture or subluxation. 3. Mild to moderate diffuse cerebral and cerebellar atrophy. 4. Mild-to-moderate chronic small vessel white matter ischemic changes in both cerebral hemispheres. 5. Mild to moderate multilevel cervical spine degenerative changes. Electronically Signed   By: Claudie Revering M.D.   On: 04/28/2019 14:04    Procedures Procedures (including critical care time)  Medications Ordered in ED Medications  HYDROcodone-acetaminophen (NORCO/VICODIN) 5-325 MG per tablet 1 tablet (1 tablet Oral Given 04/28/19 1545)     Initial Impression / Assessment and Plan / ED Course  I have reviewed the triage vital signs and the nursing notes.  Pertinent labs & imaging results that were available during my care of the patient were reviewed by me and considered in my medical decision making (see chart for details).  Clinical Course as of Apr 27 1625  Thu Apr 28, 2019  1539 ON 5/11 was 16.  Not significantly changed.   WBC(!): 18.3 [EH]  1540 Baseline   Hemoglobin(!): 9.4 [EH]    Clinical Course User Index [EH] Lorin Glass, PA-C      Patient presents today for evaluation after a syncopal event caused her to fall and strike her head on a table.  She is a hematoma on her forehead.  She is not anticoagulated.  CT head and neck were obtained without evidence of acute abnormality.  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  Previously her hemoglobin on 5/11 was 9.9, today is 9.4 therefore does not have a significant change.  Her white count elevated slightly from 5/11 from 16 - 18.3.  Her troponin is normal.  EKG does not show ischemic changes.    Her [place] score is technically elevated as her hematocrit is under 30 today.  Given her age, and the fact that she had a full LOC prior to hitting her head and PVCs on EKG discussed discharge vs tele obs with patient.  She wished for inpatient obs.  I spoke with Dr. Lorin Mercy who agreed to admit patient.   This patient was seen as a shared visit with Dr. Tyrone Nine.   Final Clinical Impressions(s) / ED Diagnoses   Final diagnoses:  Fall, initial encounter  Injury of head, initial encounter  Syncope, unspecified syncope type    ED Discharge Orders    None       Lorin Glass, PA-C 04/28/19 [place], DO 04/28/19 1702

## 2019-04-28 NOTE — ED Notes (Signed)
ED TO INPATIENT HANDOFF REPORT  ED Nurse Name and Phone #: Tiffany [phone]  S Name/Age/Gender Destiny [name] Nurse 78 y.o. female Room/Bed: 043C/043C  Code Status   Code Status: DNR  Home/SNF/Other Home Patient oriented to: self, place, time and situation Is this baseline? Yes   Triage Complete: Triage complete  Chief Complaint Fall  Triage Note Pt arrive POV for eval of fall s/p R leg surgery for tumor removal 2 weeks prior. Pt reports that she is normally anemic, and she believes that may be the reason she fell. Pt reports she "faded and then fell" striking her head. Pt denies anticoagulation, states she is one baby ASA QD.    Allergies Allergies  Allergen Reactions  . Nsaids Other (See Comments)     BRUISES EASILY Other Reaction: BRUISES EASILY Other Reaction: BRUISES EASILY   . Oxycodone-Acetaminophen Other (See Comments)     VERY CONSTIPATING, does not provide pain relief  . Aspirin Other (See Comments)    Bruising   . Augmentin [Amoxicillin-Pot Clavulanate] Other (See Comments)    headache  . Ciprocin-Fluocin-Procin [Fluocinolone]   . Ciprofloxacin Hcl     tendonitis  . Fentanyl Other (See Comments)    Does not work  . Fluclorolone Other (See Comments)  . Lyrica [Pregabalin] Other (See Comments)    dysphoria  . Oxycodone Other (See Comments)    Does not work  . Versed [Midazolam] Other (See Comments)    Does not work  . Diclofenac Sodium Other (See Comments) and Rash    Skin peeling. Voltaren gel-rash and skin peeling   . Hydromorphone Hcl Itching     Itching with large doses - able to tolerate lower doses or shorter duration  . Voltaren Gel [Diclofenac Sodium] Rash and Other (See Comments)    Skin peeling.    Level of Care/Admitting Diagnosis ED Disposition    ED Disposition Condition [facility] Area: [place] [[id number]]  Level of Care: Telemetry Medical [104]  I expect the patient will be discharged within 24 hours:  Yes  LOW acuity---Tx typically complete <24 hrs---ACUTE conditions typically can be evaluated <24 hours---LABS likely to return to acceptable levels <24 hours---IS near functional baseline---EXPECTED to return to current living arrangement---NOT newly hypoxic: Meets criteria for 5C-Observation unit  Covid Evaluation: Screening Protocol (No Symptoms)  Diagnosis: Near syncope [580998]  Admitting Physician: Karmen Bongo [[id number]]  Attending Physician: Karmen Bongo [[id number]]  PT Class (Do Not Modify): Observation [104]  PT Acc Code (Do Not Modify): Observation [10022]       B Medical/Surgery History Past Medical History:  Diagnosis Date  . Anxiety   . Arthritis   . Colon polyps   . Complication of anesthesia    BP drops with fentyl and versed  . Hepatitis    hep A  1976  . Hypertension   . Hypothyroidism   . Liposarcoma of right thigh ([facility]) 03/2019   Past Surgical History:  Procedure Laterality Date  . ABDOMINAL HYSTERECTOMY    . APPENDECTOMY    . BACK SURGERY      fusion  . CESAREAN SECTION     times 2  . EYE SURGERY Left    cataract removed  . FINGER ARTHRODESIS Left 10/02/2017   Procedure: LEFT LONG FINGER ARTHRODESIS;  Surgeon: Milly Jakob, MD;  Location: [facility];  Service: Orthopedics;  Laterality: Left;  . FOOT SURGERY Bilateral   . FRACTURE SURGERY     broken ankle  .  MEDIAL PARTIAL KNEE REPLACEMENT Left    done at [facility] Right 12/28/2014   Procedure: REVERSE SHOULDER ARTHROPLASTY;  Surgeon: Nita Sells, MD;  Location: [facility];  Service: Orthopedics;  Laterality: Right;  Right reverse total shoulder arthroplasty  . REVERSE TOTAL SHOULDER ARTHROPLASTY Right 12/28/2014   DR CHANDLER  . rotator cufff    . THYROIDECTOMY, PARTIAL     one lobe removed  . VARICOSE VEIN SURGERY Right      A IV Location/Drains/Wounds Patient Lines/Drains/Airways Status   Active Line/Drains/Airways    Name:   Placement  date:   Placement time:   Site:   Days:   Peripheral IV 12/28/14 Left Hand   12/28/14    0700    Hand   1582   Peripheral IV 04/28/19 Right Antecubital   04/28/19    1339    Antecubital   less than 1   Incision (Closed) 12/28/14 Shoulder Right   12/28/14    0836     1582   Incision (Closed) 10/02/17 Hand Left   10/02/17    1327     573   Incision (Closed) 04/04/19 Thigh Right;Posterior   04/04/19    0858     24          Intake/Output Last 24 hours No intake or output data in the 24 hours ending 04/28/19 1826  Labs/Imaging Results for orders placed or performed during the hospital encounter of 04/28/19 (from the past 48 hour(s))  Urinalysis, Routine w reflex microscopic     Status: None   Collection Time: 04/28/19  2:30 PM  Result Value Ref Range   Color, Urine YELLOW YELLOW   APPearance CLEAR CLEAR   Specific Gravity, Urine 1.013 1.005 - 1.030   pH 6.0 5.0 - 8.0   Glucose, UA NEGATIVE NEGATIVE mg/dL   Hgb urine dipstick NEGATIVE NEGATIVE   Bilirubin Urine NEGATIVE NEGATIVE   Ketones, ur NEGATIVE NEGATIVE mg/dL   Protein, ur NEGATIVE NEGATIVE mg/dL   Nitrite NEGATIVE NEGATIVE   Leukocytes,Ua NEGATIVE NEGATIVE    Comment: Performed at [facility] [address], [place], [place] [address]  Sample to Blood Bank     Status: None   Collection Time: 04/28/19  2:55 PM  Result Value Ref Range   Blood Bank Specimen SAMPLE AVAILABLE FOR TESTING    Sample Expiration      04/29/2019,2359 Performed at [facility], [place] [address] [place], [place] [address]   Comprehensive metabolic panel     Status: Abnormal   Collection Time: 04/28/19  3:00 PM  Result Value Ref Range   Sodium 131 (L) 135 - 145 mmol/L   Potassium 4.5 3.5 - 5.1 mmol/L   Chloride 98 98 - 111 mmol/L   CO2 21 (L) 22 - 32 mmol/L   Glucose, Bld 104 (H) 70 - 99 mg/dL   BUN 16 8 - 23 mg/dL   Creatinine, Ser 0.75 0.44 - 1.00 mg/dL   Calcium 8.6 (L) 8.9 - 10.3 mg/dL   Total Protein 6.0 (L) 6.5 - 8.1 g/dL    Albumin 3.3 (L) 3.5 - 5.0 g/dL   AST 19 15 - 41 U/L   ALT 14 0 - 44 U/L   Alkaline Phosphatase 50 38 - 126 U/L   Total Bilirubin 0.7 0.3 - 1.2 mg/dL   GFR calc non Af Amer >60 >60 mL/min   GFR calc Af Amer >60 >60 mL/min   Anion  gap 12 5 - 15    Comment: Performed at [facility], [place] [address], [place], [place] [address]  CBC with Differential     Status: Abnormal   Collection Time: 04/28/19  3:00 PM  Result Value Ref Range   WBC 18.3 (H) 4.0 - 10.5 K/uL   RBC 2.93 (L) 3.87 - 5.11 MIL/uL   Hemoglobin 9.4 (L) 12.0 - 15.0 g/dL   HCT 29.5 (L) 36.0 - 46.0 %   MCV 100.7 (H) 80.0 - 100.0 fL   MCH 32.1 26.0 - 34.0 pg   MCHC 31.9 30.0 - 36.0 g/dL   RDW 14.7 11.5 - 15.5 %   Platelets 479 (H) 150 - 400 K/uL   nRBC 0.0 0.0 - 0.2 %   Neutrophils Relative % 83 %   Neutro Abs 15.2 (H) 1.7 - 7.7 K/uL   Lymphocytes Relative 7 %   Lymphs Abs 1.4 0.7 - 4.0 K/uL   Monocytes Relative 8 %   Monocytes Absolute 1.5 (H) 0.1 - 1.0 K/uL   Eosinophils Relative 1 %   Eosinophils Absolute 0.1 0.0 - 0.5 K/uL   Basophils Relative 0 %   Basophils Absolute 0.1 0.0 - 0.1 K/uL   Immature Granulocytes 1 %   Abs Immature Granulocytes 0.12 (H) 0.00 - 0.07 K/uL    Comment: Performed at [facility] [address] [place], [place] [address]  Troponin I - Once     Status: None   Collection Time: 04/28/19  3:00 PM  Result Value Ref Range   Troponin I <0.03 <0.03 ng/mL    Comment: Performed at [facility] [address] [place], [place] [address]  SARS Coronavirus 2 (CEPHEID - Performed in [facility] hospital lab), Hosp Order     Status: None   Collection Time: 04/28/19  5:07 PM  Result Value Ref Range   SARS Coronavirus 2 NEGATIVE NEGATIVE    Comment: (NOTE) If result is NEGATIVE SARS-CoV-2 target nucleic acids are NOT DETECTED. The SARS-CoV-2 RNA is generally detectable in upper and lower  respiratory specimens during the acute phase of infection. The lowest  concentration of  SARS-CoV-2 viral copies this assay can detect is 250  copies / mL. A negative result does not preclude SARS-CoV-2 infection  and should not be used as the sole basis for treatment or other  patient management decisions.  A negative result may occur with  improper specimen collection / handling, submission of specimen other  than nasopharyngeal swab, presence of viral mutation(s) within the  areas targeted by this assay, and inadequate number of viral copies  (<250 copies / mL). A negative result must be combined with clinical  observations, patient history, and epidemiological information. If result is POSITIVE SARS-CoV-2 target nucleic acids are DETECTED. The SARS-CoV-2 RNA is generally detectable in upper and lower  respiratory specimens dur ing the acute phase of infection.  Positive  results are indicative of active infection with SARS-CoV-2.  Clinical  correlation with patient history and other diagnostic information is  necessary to determine patient infection status.  Positive results do  not rule out bacterial infection or co-infection with other viruses. If result is PRESUMPTIVE POSTIVE SARS-CoV-2 nucleic acids MAY BE PRESENT.   A presumptive positive result was obtained on the submitted specimen  and confirmed on repeat testing.  While 2019 novel coronavirus  (SARS-CoV-2) nucleic acids may be present in the submitted sample  additional confirmatory testing may be necessary for epidemiological  and / or clinical management purposes  to differentiate between  SARS-CoV-2 and other Sarbecovirus currently known to infect humans.  If clinically indicated additional testing with an alternate test  methodology [phone]) is advised. The SARS-CoV-2 RNA is generally  detectable in upper and lower respiratory sp ecimens during the acute  phase of infection. The expected result is Negative. Fact Sheet for Patients:  StrictlyIdeas.no Fact Sheet for Healthcare  Providers: BankingDealers.co.za This test is not yet approved or cleared by the [place] FDA and has been authorized for detection and/or diagnosis of SARS-CoV-2 by FDA under an Emergency Use Authorization (EUA).  This EUA will remain in effect (meaning this test can be used) for the duration of the COVID-19 declaration under Section 564(b)(1) of the Act, 21 U.S.C. section 360bbb-3(b)(1), unless the authorization is terminated or revoked sooner. Performed at [facility], [place] [address], [place], [place] [address]    Ct Head Wo Contrast  Result Date: 04/28/2019 CLINICAL DATA:  Syncopal episode resulting in a fall, hitting the front of her head. EXAM: CT HEAD WITHOUT CONTRAST CT CERVICAL SPINE WITHOUT CONTRAST TECHNIQUE: Multidetector CT imaging of the head and cervical spine was performed following the standard protocol without intravenous contrast. Multiplanar CT image reconstructions of the cervical spine were also generated. COMPARISON:  Cervical spine MR dated 10/25/2018. FINDINGS: CT HEAD FINDINGS Brain: Mild-to-moderate enlargement of the ventricles and subarachnoid spaces. Mild-to-moderate patchy white matter low density in both cerebral hemispheres. No intracranial hemorrhage, mass lesion or CT evidence of acute infarction. Vascular: No hyperdense vessel or unexpected calcification. Skull: Normal. Negative for fracture or focal lesion. Sinuses/Orbits: Unremarkable. Other: Left frontal scalp hematoma. CT CERVICAL SPINE FINDINGS Alignment: Mild levoconvex cervicothoracic scoliosis. No subluxations. Skull base and vertebrae: No acute fracture. No primary bone lesion or focal pathologic process. Soft tissues and spinal canal: No prevertebral fluid or swelling. No visible canal hematoma. Disc levels:  Mild-to-moderate multilevel degenerative changes. Upper chest: Clear lung apices. Other: None. IMPRESSION: 1. Left frontal scalp hematoma without skull fracture or  intracranial hemorrhage. 2. No cervical spine fracture or subluxation. 3. Mild to moderate diffuse cerebral and cerebellar atrophy. 4. Mild-to-moderate chronic small vessel white matter ischemic changes in both cerebral hemispheres. 5. Mild to moderate multilevel cervical spine degenerative changes. Electronically Signed   By: Claudie Revering M.D.   On: 04/28/2019 14:04   Ct Cervical Spine Wo Contrast  Result Date: 04/28/2019 CLINICAL DATA:  Syncopal episode resulting in a fall, hitting the front of her head. EXAM: CT HEAD WITHOUT CONTRAST CT CERVICAL SPINE WITHOUT CONTRAST TECHNIQUE: Multidetector CT imaging of the head and cervical spine was performed following the standard protocol without intravenous contrast. Multiplanar CT image reconstructions of the cervical spine were also generated. COMPARISON:  Cervical spine MR dated 10/25/2018. FINDINGS: CT HEAD FINDINGS Brain: Mild-to-moderate enlargement of the ventricles and subarachnoid spaces. Mild-to-moderate patchy white matter low density in both cerebral hemispheres. No intracranial hemorrhage, mass lesion or CT evidence of acute infarction. Vascular: No hyperdense vessel or unexpected calcification. Skull: Normal. Negative for fracture or focal lesion. Sinuses/Orbits: Unremarkable. Other: Left frontal scalp hematoma. CT CERVICAL SPINE FINDINGS Alignment: Mild levoconvex cervicothoracic scoliosis. No subluxations. Skull base and vertebrae: No acute fracture. No primary bone lesion or focal pathologic process. Soft tissues and spinal canal: No prevertebral fluid or swelling. No visible canal hematoma. Disc levels:  Mild-to-moderate multilevel degenerative changes. Upper chest: Clear lung apices. Other: None. IMPRESSION: 1. Left frontal scalp hematoma without skull fracture or intracranial hemorrhage. 2.  No cervical spine fracture or subluxation. 3. Mild to moderate diffuse cerebral and cerebellar atrophy. 4. Mild-to-moderate chronic small vessel white matter  ischemic changes in both cerebral hemispheres. 5. Mild to moderate multilevel cervical spine degenerative changes. Electronically Signed   By: Claudie Revering M.D.   On: 04/28/2019 14:04    Pending Labs Unresulted Labs (From admission, onward)    Start     Ordered   04/29/19 0737  Basic metabolic panel  Tomorrow morning,   R     04/28/19 1808   04/29/19 0500  CBC  Tomorrow morning,   R     04/28/19 1808   04/28/19 1806  TSH  Once,   R     04/28/19 1808          Vitals/Pain Today's Vitals   04/28/19 1630 04/28/19 1645 04/28/19 1700 04/28/19 1711  BP: (!) 108/45 (!) 127/54 (!) 120/53   Pulse: 70 66 65   Resp: 20 (!) 23 17   Temp:      TempSrc:      SpO2: 97% 97% 94%   Weight:      Height:      PainSc:    2     Isolation Precautions No active isolations  Medications Medications  aspirin EC tablet 81 mg (has no administration in time range)  HYDROcodone-acetaminophen (NORCO/VICODIN) 5-325 MG per tablet 1 tablet (has no administration in time range)  traZODone (DESYREL) tablet 50 mg (has no administration in time range)  zolpidem (AMBIEN) tablet 5 mg (has no administration in time range)  levothyroxine (SYNTHROID) tablet 125 mcg (has no administration in time range)  senna-docusate (Senokot-S) tablet 2 tablet (has no administration in time range)  mirabegron ER (MYRBETRIQ) tablet 50 mg (has no administration in time range)  nitrofurantoin (macrocrystal-monohydrate) (MACROBID) capsule 100 mg (has no administration in time range)  gabapentin (NEURONTIN) capsule 600-900 mg (has no administration in time range)  methocarbamol (ROBAXIN) tablet 750 mg (has no administration in time range)  enoxaparin (LOVENOX) injection 40 mg (has no administration in time range)  sodium chloride flush (NS) 0.9 % injection 3 mL (has no administration in time range)  lactated ringers infusion (has no administration in time range)  acetaminophen (TYLENOL) tablet 650 mg (has no administration in time  range)    Or  acetaminophen (TYLENOL) suppository 650 mg (has no administration in time range)  ondansetron (ZOFRAN) tablet 4 mg (has no administration in time range)    Or  ondansetron (ZOFRAN) injection 4 mg (has no administration in time range)  metoprolol succinate (TOPROL-XL) 24 hr tablet 50 mg (has no administration in time range)  HYDROcodone-acetaminophen (NORCO/VICODIN) 5-325 MG per tablet 1 tablet (1 tablet Oral Given 04/28/19 1545)    Mobility walks Moderate fall risk   Focused Assessments Neuro Assessment Handoff:  Swallow screen pass? na         Neuro Assessment: Exceptions to WDL Neuro Checks:      Last Documented NIHSS Modified Score:   Has TPA been given? No If patient is a Neuro Trauma and patient is going to OR before floor call report to [place] nurse: [phone] or [phone]     R Recommendations: See Admitting Provider Note  Report given to:   Additional Notes:  Recent R leg surg, amb w/ assist GCS 15 A&Ox4.

## 2019-04-28 NOTE — ED Notes (Signed)
Report given to Mike, RN

## 2019-04-29 ENCOUNTER — Encounter (HOSPITAL_COMMUNITY): Payer: Self-pay | Admitting: Internal Medicine

## 2019-04-29 DIAGNOSIS — R55 Syncope and collapse: Secondary | ICD-10-CM | POA: Diagnosis not present

## 2019-04-29 LAB — CBC
HCT: 25.7 % — ABNORMAL LOW (ref 36.0–46.0)
Hemoglobin: 8.5 g/dL — ABNORMAL LOW (ref 12.0–15.0)
MCH: 32.2 pg (ref 26.0–34.0)
MCHC: 33.1 g/dL (ref 30.0–36.0)
MCV: 97.3 fL (ref 80.0–100.0)
Platelets: 441 10*3/uL — ABNORMAL HIGH (ref 150–400)
RBC: 2.64 MIL/uL — ABNORMAL LOW (ref 3.87–5.11)
RDW: 14.9 % (ref 11.5–15.5)
WBC: 12.9 10*3/uL — ABNORMAL HIGH (ref 4.0–10.5)
nRBC: 0 % (ref 0.0–0.2)

## 2019-04-29 LAB — BASIC METABOLIC PANEL
Anion gap: 9 (ref 5–15)
BUN: 12 mg/dL (ref 8–23)
CO2: 24 mmol/L (ref 22–32)
Calcium: 8.5 mg/dL — ABNORMAL LOW (ref 8.9–10.3)
Chloride: 99 mmol/L (ref 98–111)
Creatinine, Ser: 0.75 mg/dL (ref 0.44–1.00)
GFR calc Af Amer: >60 mL/min (ref >60)
GFR calc non Af Amer: >60 mL/min (ref >60)
Glucose, Bld: 100 mg/dL — ABNORMAL HIGH (ref 70–99)
Potassium: 3.7 mmol/L (ref 3.5–5.1)
Sodium: 132 mmol/L — ABNORMAL LOW (ref 135–145)

## 2019-04-29 MED ORDER — SODIUM CHLORIDE 0.9 % IV BOLUS: 250.0000 mL | Freq: Once | INTRAVENOUS | Status: DC

## 2019-04-29 MED ORDER — LACTATED RINGERS IV BOLUS
250.0000 mL | Freq: Once | INTRAVENOUS | Status: AC
  Administered 2019-04-29: 250 mL via INTRAVENOUS

## 2019-04-29 NOTE — Discharge Summary (Signed)
Physician Discharge Summary  Destiny [name] KGM:[id number] DOB: 03-Oct-1941 DOA: 04/28/2019  PCP: Deland Pretty, MD  Admit date: 04/28/2019 Discharge date: 04/29/2019  Time spent: 45 minutes  Recommendations for Outpatient Follow-up:  1. Take medications as prescribed. Avoid taking pain medication on empty stomach. Follow up with PCP 1-2 weeks for evaluation of symptoms. Recommend tracking tsh and free T4 and consider adjusting dose of synthroid if indicated. 2. Follow up with surgeon as scheduled. Tuesday for dressing change 3. Home Health rn and PT   Discharge Diagnoses:  Principal Problem:   Near syncope Active Problems:   Hypothyroidism   Chronic pain syndrome   Essential hypertension   Adjustment disorder with mixed anxiety and depressed mood   Liposarcoma of right thigh [facility])   Discharge Condition: stable  Diet recommendation: heart healthy  Filed Weights   04/28/19 1202 04/28/19 2039 04/29/19 0443  Weight: 69.4 kg 68.9 kg 68.9 kg    History of present illness:  ORA BOLLIG is a 78 y.o. female with medical history significant of hypothyroidism and HTN presented 04/28/19 with a fall. She was in the process of making coffee and she felt faint and the room was spinning.  She hit her head on the counter and slumped onto the kitchen floor.  She did not lose consciousness.  It took her "a while" to be able to get up even with help.  She went from feeling dizzy to feeling nauseated.  She did not have breakfast before this happened.  Her husband brought her coffee and she started feeling better then.  She took a pain pill at 730 and went to bed for about 45 minutes prior to this episode. Of note she had a R thigh soft tissue mass biopsy/aspiration on 5/11.  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  She then underwent radical resection of the soft tissue tumor of the thigh and knee on 5/22 at [facility]. She feels she may need HH as her husband unable to  help  Hospital Course:   Near syncope. Likely related to pain medicine on empty stomach. She did not experience LOC. She reported taking a pain pill and going back to bed about 45 minutes prior; had not had anything to eat or drink. Also taking 2 medications with anticholinergic properties. No events on tele. Not orthostatic. Neuro exam benign. Evaluated by PT who opined she received IV fluid and anti-hypertensive meds held initially  Adjustment d/o Stable this am. Smiling and eating breakfast. She has been under a lot of pressure with her recent cancer diagnosis and the COVID crisis. OP follow up as needed.  Liposarcoma She reported that surgical pathology from her May surgery  pending and will determine further treatment course. Has follow up appointment 6/9 with surgeon.   Chronic pain stable at baseline. Continue home Vicodin. Medications review in the [place] Controlled Substances Reporting System and while she is receiving medications from multiple providers it appears she is taking them as prescribed.  HTN BP high end of normal at discharge. Will resume home medications at pre-admit doses.   Hypothyroidism Tsh 4.65. continue Synthroid at current dose. Follow up with PCP for evaluation of need  Procedures:  Consultations:    Discharge Exam: Vitals:   04/29/19 0443 04/29/19 0943  BP: 113/68 (!) 149/70  Pulse: 73 77  Resp:    Temp:  98.5 F (36.9 C)  SpO2:      General: sitting up in bed eating breakfast. No acute  distress Cardiovascular: rrr no mgr no LE edema Respiratory: normal effort BS clear bilaterally Head: bruising left forehead and under left eye no swelling mild tenderness  Discharge Instructions   Discharge Instructions    Call MD for:  persistant dizziness or light-headedness   Complete by:  As directed    Call MD for:  severe uncontrolled pain   Complete by:  As directed    Call MD for:  temperature >100.4   Complete by:  As directed    Diet - low  sodium heart healthy   Complete by:  As directed    Discharge instructions   Complete by:  As directed    Take medications as prescribed.  Avoid taking pain medicine on empty stomach Maintain hydration   Increase activity slowly   Complete by:  As directed      Allergies as of 04/29/2019      Reactions   Nsaids Other (See Comments)    BRUISES EASILY Other Reaction: BRUISES EASILY Other Reaction: BRUISES EASILY   Oxycodone-acetaminophen Other (See Comments)    VERY CONSTIPATING, does not provide pain relief   Aspirin Other (See Comments)   Bruising   Augmentin [amoxicillin-pot Clavulanate] Other (See Comments)   headache   Ciprocin-fluocin-procin [fluocinolone]    Ciprofloxacin Hcl    tendonitis   Fentanyl Other (See Comments)   Does not work   Building surveyor Other (See Comments)   Lyrica [pregabalin] Other (See Comments)   dysphoria   Oxycodone Other (See Comments)   Does not work   Versed [midazolam] Other (See Comments)   Does not work   Diclofenac Sodium Other (See Comments), Rash   Skin peeling. Voltaren gel-rash and skin peeling   Hydromorphone Hcl Itching    Itching with large doses - able to tolerate lower doses or shorter duration   Voltaren Gel [diclofenac Sodium] Rash, Other (See Comments)   Skin peeling.      Medication List    TAKE these medications   acetaminophen 500 MG tablet Commonly known as:  TYLENOL Take 500 mg by mouth every 4 (four) hours as needed.   amLODipine 5 MG tablet Commonly known as:  NORVASC Take 5 mg by mouth daily.   aspirin EC 81 MG tablet Take 81 mg by mouth 2 (two) times a day.   b complex vitamins capsule Take 1 capsule by mouth daily.   Calcium Carb-Cholecalciferol 600-200 MG-UNIT Tabs Take 1 tablet by mouth daily.   cholecalciferol 1000 units tablet Commonly known as:  VITAMIN D Take 2,000 Units by mouth daily.   ferrous sulfate 325 (65 FE) MG tablet Take 325 mg by mouth daily with breakfast.   gabapentin 300 MG  capsule Commonly known as:  NEURONTIN Take 600-900 mg by mouth See admin instructions. Take 300 mg every morning and900 mg at bedtime   HYDROcodone-acetaminophen 5-325 MG tablet Commonly known as:  NORCO/VICODIN Take 1 tablet by mouth every 4 (four) hours as needed for moderate pain or severe pain.   ipratropium 0.06 % nasal spray Commonly known as:  ATROVENT Place 1 spray into the nose as needed.   levothyroxine 125 MCG tablet Commonly known as:  SYNTHROID Take 125 mcg by mouth daily before breakfast.   loratadine-pseudoephedrine 5-120 MG tablet Commonly known as:  CLARITIN-D 12-hour Take 1 tablet by mouth daily.   methocarbamol 750 MG tablet Commonly known as:  ROBAXIN Take 750 mg by mouth 2 (two) times daily as needed for muscle spasms.   metoprolol succinate 50  MG 24 hr tablet Commonly known as:  TOPROL-XL Take 50 mg by mouth 2 (two) times daily.   Myrbetriq 50 MG Tb24 tablet Generic drug:  mirabegron ER Take 50 mg by mouth at bedtime.   nitrofurantoin (macrocrystal-monohydrate) 100 MG capsule Commonly known as:  MACROBID Take 100 mg by mouth daily.   olmesartan 40 MG tablet Commonly known as:  BENICAR Take 40 mg by mouth daily.   senna-docusate 8.6-50 MG tablet Commonly known as:  Senokot-S Take 2 tablets by mouth 2 (two) times a day.   SOY BALANCE PO Take 1 tablet by mouth daily. Soy bean extract   tolterodine 4 MG 24 hr capsule Commonly known as:  DETROL LA Take 4 mg by mouth daily.   traZODone 50 MG tablet Commonly known as:  DESYREL Take 50 mg by mouth as needed.   zolpidem 10 MG tablet Commonly known as:  AMBIEN Take 5 mg by mouth at bedtime as needed for sleep.      Allergies  Allergen Reactions  . Nsaids Other (See Comments)     BRUISES EASILY Other Reaction: BRUISES EASILY Other Reaction: BRUISES EASILY   . Oxycodone-Acetaminophen Other (See Comments)     VERY CONSTIPATING, does not provide pain relief  . Aspirin Other (See Comments)     Bruising   . Augmentin [Amoxicillin-Pot Clavulanate] Other (See Comments)    headache  . Ciprocin-Fluocin-Procin [Fluocinolone]   . Ciprofloxacin Hcl     tendonitis  . Fentanyl Other (See Comments)    Does not work  . Fluclorolone Other (See Comments)  . Lyrica [Pregabalin] Other (See Comments)    dysphoria  . Oxycodone Other (See Comments)    Does not work  . Versed [Midazolam] Other (See Comments)    Does not work  . Diclofenac Sodium Other (See Comments) and Rash    Skin peeling. Voltaren gel-rash and skin peeling   . Hydromorphone Hcl Itching     Itching with large doses - able to tolerate lower doses or shorter duration  . Voltaren Gel [Diclofenac Sodium] Rash and Other (See Comments)    Skin peeling.      The results of significant diagnostics from this hospitalization (including imaging, microbiology, ancillary and laboratory) are listed below for reference.    Significant Diagnostic Studies: Ct Head Wo Contrast  Result Date: 04/28/2019 CLINICAL DATA:  Syncopal episode resulting in a fall, hitting the front of her head. EXAM: CT HEAD WITHOUT CONTRAST CT CERVICAL SPINE WITHOUT CONTRAST TECHNIQUE: Multidetector CT imaging of the head and cervical spine was performed following the standard protocol without intravenous contrast. Multiplanar CT image reconstructions of the cervical spine were also generated. COMPARISON:  Cervical spine MR dated 10/25/2018. FINDINGS: CT HEAD FINDINGS Brain: Mild-to-moderate enlargement of the ventricles and subarachnoid spaces. Mild-to-moderate patchy white matter low density in both cerebral hemispheres. No intracranial hemorrhage, mass lesion or CT evidence of acute infarction. Vascular: No hyperdense vessel or unexpected calcification. Skull: Normal. Negative for fracture or focal lesion. Sinuses/Orbits: Unremarkable. Other: Left frontal scalp hematoma. CT CERVICAL SPINE FINDINGS Alignment: Mild levoconvex cervicothoracic scoliosis. No  subluxations. Skull base and vertebrae: No acute fracture. No primary bone lesion or focal pathologic process. Soft tissues and spinal canal: No prevertebral fluid or swelling. No visible canal hematoma. Disc levels:  Mild-to-moderate multilevel degenerative changes. Upper chest: Clear lung apices. Other: None. IMPRESSION: 1. Left frontal scalp hematoma without skull fracture or intracranial hemorrhage. 2. No cervical spine fracture or subluxation. 3. Mild to moderate diffuse cerebral  and cerebellar atrophy. 4. Mild-to-moderate chronic small vessel white matter ischemic changes in both cerebral hemispheres. 5. Mild to moderate multilevel cervical spine degenerative changes. Electronically Signed   By: Claudie Revering M.D.   On: 04/28/2019 14:04   Ct Cervical Spine Wo Contrast  Result Date: 04/28/2019 CLINICAL DATA:  Syncopal episode resulting in a fall, hitting the front of her head. EXAM: CT HEAD WITHOUT CONTRAST CT CERVICAL SPINE WITHOUT CONTRAST TECHNIQUE: Multidetector CT imaging of the head and cervical spine was performed following the standard protocol without intravenous contrast. Multiplanar CT image reconstructions of the cervical spine were also generated. COMPARISON:  Cervical spine MR dated 10/25/2018. FINDINGS: CT HEAD FINDINGS Brain: Mild-to-moderate enlargement of the ventricles and subarachnoid spaces. Mild-to-moderate patchy white matter low density in both cerebral hemispheres. No intracranial hemorrhage, mass lesion or CT evidence of acute infarction. Vascular: No hyperdense vessel or unexpected calcification. Skull: Normal. Negative for fracture or focal lesion. Sinuses/Orbits: Unremarkable. Other: Left frontal scalp hematoma. CT CERVICAL SPINE FINDINGS Alignment: Mild levoconvex cervicothoracic scoliosis. No subluxations. Skull base and vertebrae: No acute fracture. No primary bone lesion or focal pathologic process. Soft tissues and spinal canal: No prevertebral fluid or swelling. No visible  canal hematoma. Disc levels:  Mild-to-moderate multilevel degenerative changes. Upper chest: Clear lung apices. Other: None. IMPRESSION: 1. Left frontal scalp hematoma without skull fracture or intracranial hemorrhage. 2. No cervical spine fracture or subluxation. 3. Mild to moderate diffuse cerebral and cerebellar atrophy. 4. Mild-to-moderate chronic small vessel white matter ischemic changes in both cerebral hemispheres. 5. Mild to moderate multilevel cervical spine degenerative changes. Electronically Signed   By: Claudie Revering M.D.   On: 04/28/2019 14:04   [place] Core Biopsy (soft Tissue)  Result Date: 04/04/2019 INDICATION: No known primary, now with symptomatic complex soft tissue mass within the musculature of the distal posterior aspect of the right thigh. Please perform ultrasound-guided aspiration/biopsy for tissue diagnostic purposes. EXAM: ULTRASOUND-GUIDED RIGHT POSTERIOR THIGH SOFT TISSUE MASS BIOPSY COMPARISON:  Right lower CT-03/29/2019 MEDICATIONS: None ANESTHESIA/SEDATION: Moderate (conscious) sedation was employed during this procedure. A total of Versed 1 mg and Fentanyl 25 mcg was administered intravenously. Moderate Sedation Time: 12 minutes. The patient's level of consciousness and vital signs were monitored continuously by radiology nursing throughout the procedure under my direct supervision. COMPLICATIONS: None immediate. TECHNIQUE: Informed written consent was obtained from the patient after a discussion of the risks, benefits and alternatives to treatment. Questions regarding the procedure were encouraged and answered. Initial ultrasound scanning demonstrated indeterminate approximately 6.2 x 6.0 cm complex mass within the musculature of the posterior distal aspect of the right thigh. An ultrasound image was saved for documentation purposes. The procedure was planned. A timeout was performed prior to the initiation of the procedure. The operative was prepped and draped in the usual sterile  fashion, and a sterile drape was applied covering the operative field. A timeout was performed prior to the initiation of the procedure. Local anesthesia was provided with 1% lidocaine. Under direct ultrasound guidance, a 17 gauge coaxial needle was advanced into the hypoechoic component of the complex soft tissue mass. Ultrasound image was saved for procedural documentation purposes. Despite attempted vigorous aspiration, no fluid was able to be aspirated from the complex mass. As such, under direct ultrasound guidance, an 18 gauge core needle device was utilized to obtain to obtain 5 core needle biopsies of the indeterminate soft tissue/muscular mass. The samples were placed in saline and submitted to pathology. The needle was removed and hemostasis was  achieved with manual compression. Post procedure scan was negative for significant hematoma. A dressing was placed. The patient tolerated the procedure well without immediate postprocedural complication. IMPRESSION: Technically successful ultrasound guided biopsy of indeterminate soft tissue mass within the musculature of the posterior distal aspect the right thigh. Note, no fluid was able to be aspirated from the complex soft tissue mass. Electronically Signed   By: Sandi Mariscal M.D.   On: 04/04/2019 09:18    Microbiology: Recent Results (from the past 240 hour(s))  SARS Coronavirus 2 (CEPHEID - Performed in [place] hospital lab), Hosp Order     Status: None   Collection Time: 04/28/19  5:07 PM  Result Value Ref Range Status   SARS Coronavirus 2 NEGATIVE NEGATIVE Final    Comment: (NOTE) If result is NEGATIVE SARS-CoV-2 target nucleic acids are NOT DETECTED. The SARS-CoV-2 RNA is generally detectable in upper and lower  respiratory specimens during the acute phase of infection. The lowest  concentration of SARS-CoV-2 viral copies this assay can detect is 250  copies / mL. A negative result does not preclude SARS-CoV-2 infection  and should not  be used as the sole basis for treatment or other  patient management decisions.  A negative result may occur with  improper specimen collection / handling, submission of specimen other  than nasopharyngeal swab, presence of viral mutation(s) within the  areas targeted by this assay, and inadequate number of viral copies  (<250 copies / mL). A negative result must be combined with clinical  observations, patient history, and epidemiological information. If result is POSITIVE SARS-CoV-2 target nucleic acids are DETECTED. The SARS-CoV-2 RNA is generally detectable in upper and lower  respiratory specimens dur ing the acute phase of infection.  Positive  results are indicative of active infection with SARS-CoV-2.  Clinical  correlation with patient history and other diagnostic information is  necessary to determine patient infection status.  Positive results do  not rule out bacterial infection or co-infection with other viruses. If result is PRESUMPTIVE POSTIVE SARS-CoV-2 nucleic acids MAY BE PRESENT.   A presumptive positive result was obtained on the submitted specimen  and confirmed on repeat testing.  While 2019 novel coronavirus  (SARS-CoV-2) nucleic acids may be present in the submitted sample  additional confirmatory testing may be necessary for epidemiological  and / or clinical management purposes  to differentiate between  SARS-CoV-2 and other Sarbecovirus currently known to infect humans.  If clinically indicated additional testing with an alternate test  methodology [phone]) is advised. The SARS-CoV-2 RNA is generally  detectable in upper and lower respiratory sp ecimens during the acute  phase of infection. The expected result is Negative. Fact Sheet for Patients:  StrictlyIdeas.no Fact Sheet for Healthcare Providers: BankingDealers.co.za This test is not yet approved or cleared by the [place] FDA and has been authorized  for detection and/or diagnosis of SARS-CoV-2 by FDA under an Emergency Use Authorization (EUA).  This EUA will remain in effect (meaning this test can be used) for the duration of the COVID-19 declaration under Section 564(b)(1) of the Act, 21 U.S.C. section 360bbb-3(b)(1), unless the authorization is terminated or revoked sooner. Performed at [facility], [place] [address], [place], [place] [address]      Labs: Basic Metabolic Panel: Recent Labs  Lab 04/28/19 1500 04/29/19 0421  NA 131* 132*  K 4.5 3.7  CL 98 99  CO2 21* 24  GLUCOSE 104* 100*  BUN 16 12  CREATININE 0.75 0.75  CALCIUM 8.6* 8.5*   Liver Function Tests: Recent Labs  Lab 04/28/19 1500  AST 19  ALT 14  ALKPHOS 50  BILITOT 0.7  PROT 6.0*  ALBUMIN 3.3*   No results for input(s): LIPASE, AMYLASE in the last 168 hours. No results for input(s): AMMONIA in the last 168 hours. CBC: Recent Labs  Lab 04/28/19 1500 04/29/19 0421  WBC 18.3* 12.9*  NEUTROABS 15.2*  --   HGB 9.4* 8.5*  HCT 29.5* 25.7*  MCV 100.7* 97.3  PLT 479* 441*   Cardiac Enzymes: Recent Labs  Lab 04/28/19 1500  TROPONINI <0.03   BNP: BNP (last 3 results) No results for input(s): BNP in the last 8760 hours.  ProBNP (last 3 results) No results for input(s): PROBNP in the last 8760 hours.  CBG: No results for input(s): GLUCAP in the last 168 hours.     Signed:  Radene Gunning NP.  [facility] 04/29/2019, 11:34 AM

## 2019-04-29 NOTE — Plan of Care (Signed)
  Problem: Clinical Measurements: Goal: Ability to maintain clinical measurements within normal limits will improve Outcome: Completed/Met Goal: Respiratory complications will improve Outcome: Completed/Met   Problem: Coping: Goal: Level of anxiety will decrease Outcome: Completed/Met

## 2019-04-29 NOTE — TOC Initial Note (Signed)
Transition of Care [facility]) - Initial/Assessment Note    Patient Details  Name: Destiny [name] MRN: [id number] Date of Birth: 29-Jun-1941  Transition of Care [facility]) CM/SW Contact:    Sherrilyn Rist Phone Number: [phone] 04/29/2019, 12:46 PM  Clinical Narrative:                 CM talked to patient at the bedside about [place] choices; patient wants to talk to her spouse before making a decision. He does not want anyone in the home. [place] list given to the patient. CM informed her that if she wants [place] prior to discharge , it can be arranged. If she wants [place] after dc home, her PCP can make arrangements from the office.  Expected Discharge Plan: [facility](pt is refusing HHC at this time)- wants to talk to spouse first. Barriers to Discharge: No Barriers Identified   Patient Goals and CMS Choice Patient states their goals for this hospitalization and ongoing recovery are:: to stay at home CMS Medicare.gov Compare Post Acute Care list provided to:: Patient Choice offered to / list presented to : Patient  Expected Discharge Plan and Services Expected Discharge Plan: [facility](pt is refusing [place] at this time) In-house Referral: NA Discharge Planning Services: CM Consult Post Acute Care Choice: NA Living arrangements for the past 2 months: Single Family Home Expected Discharge Date: 04/29/19               DME Arranged: N/A DME Agency: NA       HH Arranged: Refused [place] Agency: NA        Prior Living Arrangements/Services Living arrangements for the past 2 months: [place] Lives with:: Spouse   Do you feel safe going back to the place where you live?: Yes      Need for Family Participation in Patient Care: Yes (Comment) Care giver support system in place?: Yes (comment)   Criminal Activity/Legal Involvement Pertinent to Current Situation/Hospitalization: No - Comment as needed  Activities of Daily Living Home  Assistive Devices/Equipment: Environmental consultant (specify type), Cane (specify quad or straight) ADL Screening (condition at time of admission) Patient's cognitive ability adequate to safely complete daily activities?: Yes Is the patient deaf or have difficulty hearing?: Yes(wears hearing aids at home ) Does the patient have difficulty seeing, even when wearing glasses/contacts?: No Does the patient have difficulty concentrating, remembering, or making decisions?: No Patient able to express need for assistance with ADLs?: Yes Does the patient have difficulty dressing or bathing?: No Independently performs ADLs?: Yes (appropriate for developmental age) Does the patient have difficulty walking or climbing stairs?: No Weakness of Legs: Right Weakness of Arms/Hands: None  Permission Sought/Granted Permission sought to share information with : Case Manager Permission granted to share information with : Yes, Verbal Permission Granted              Emotional Assessment Appearance:: Appears stated age Attitude/Demeanor/Rapport: Gracious, Engaged Affect (typically observed): Accepting Orientation: : Oriented to Self, Oriented to  Time, Oriented to Place, Oriented to Situation Alcohol / Substance Use: Not Applicable Psych Involvement: No (comment)  Admission diagnosis:  Injury of head, initial encounter [S09.90XA] Fall, initial encounter [W19.XXXA] Syncope, unspecified syncope type [R55] Patient Active Problem List   Diagnosis Date Noted  . Near syncope 04/28/2019  . Adjustment disorder with mixed anxiety and depressed mood 04/28/2019  . Liposarcoma of right thigh ([place]) 04/28/2019  . Osteoarthritis of right knee 03/10/2019  .  Hip pain, bilateral 01/07/2018  . Sinus tarsi syndrome 08/20/2016  . Right knee pain 07/17/2016  . S/p reverse total shoulder arthroplasty 12/28/2014  . Abnormality of gait 04/25/2014  . Foot pain 06/03/2011  . Metatarsalgia 06/03/2011  . Chronic pain syndrome 12/05/2009   . INSOMNIA 08/15/2009  . INGROWN TOENAIL 06/06/2009  . LEG PAIN, BILATERAL 06/05/2009  . OTHER VITAMIN B12 DEFICIENCY ANEMIA 04/04/2009  . MALAISE AND FATIGUE 02/28/2009  . Hypothyroidism 07/26/2008  . HYPERLIPIDEMIA 11/05/2007  . NEUROPATHY, IDIOPATHIC PERIPHERAL NEC 09/16/2007  . Essential hypertension 01/28/2007  . Arthritis of hand, degenerative 01/28/2007  . ROTATOR CUFF INJURY, RIGHT SHOULDER 01/28/2007  . OSTEOPENIA 01/28/2007   PCP:  Deland Pretty, MD Pharmacy:   [place], [place] [address] Phone: [phone] Fax: [phone]     Social Determinants of Health (SDOH) Interventions    Readmission Risk Interventions No flowsheet data found.

## 2019-04-29 NOTE — Progress Notes (Signed)
Ortho VS: lying 119/60, HR 71                  Sitting 135/60, 71                  Standing 115/63, 72                  Standing 3 min 113/68, 73

## 2019-04-29 NOTE — Discharge Instructions (Signed)
Dr. Tora Perches is an internal medicine physician with [facility] at [place].  She is an excellent physician and person if you need a new PCP

## 2019-04-29 NOTE — Evaluation (Signed)
Physical Therapy Evaluation Patient Details Name: Destiny [name] MRN: [id number] DOB: [birth year] Today's Date: 04/29/2019   History of Present Illness  78 yo female with onset of near syncopal episode from hypotension after recent removal of her R thigh sarcoma and sartorius mm was referred to PT.  Pt is in minimal discomfort but is weak and has been requiring a walker at home for mobility.  PMHx:  HTN, hypothyroidism, falls, anxiety, fusion in lumbar spine  Clinical Impression  Pt experienced low BP at home and did orthostatics with nursing in for last reading: Supine 139/63, sitting 114/55, standing initially 138/69 and 3 min standing 155/68.  Pt is not light headed and so walked on the hall with HHA, but is more controlled with RW and will recommend she continue this at home.  Pt feels she may be past the RW but also recommend HHPT for strengthening and managing her ongoing changes of gait with the sartorius removal on R thigh and ongoing healing with edema RLE and JP drain in place.  Continue acute therapy as long as pt is in hosp and transition to home therapy services thereafter.    Follow Up Recommendations Home health PT;Supervision for mobility/OOB    Equipment Recommendations  None recommended by PT(has a RW)    Recommendations for Other Services       Precautions / Restrictions Precautions Precautions: Fall(ck BP's) Precaution Comments: orthostatics were fine Restrictions Weight Bearing Restrictions: No Other Position/Activity Restrictions: monitor safety with R thigh mm surgery      Mobility  Bed Mobility Overal bed mobility: Needs Assistance Bed Mobility: Supine to Sit;Sit to Supine     Supine to sit: Min assist Sit to supine: Min assist   General bed mobility comments: mainly assisting her RLE on and off bed, has otherwise good control for walking with HHA  Transfers Overall transfer level: Needs assistance Equipment used: 1 person hand held assist Transfers:  Sit to/from Stand Sit to Stand: Min guard         General transfer comment: pt is able to stand but PT is steadying her  Ambulation/Gait Ambulation/Gait assistance: Min assist;Min guard Gait Distance (Feet): 100 Feet Assistive device: 1 person hand held assist Gait Pattern/deviations: Step-through pattern;Decreased stride length;Wide base of support;Decreased weight shift to right Gait velocity: reduced Gait velocity interpretation: <1.8 ft/sec, indicate of risk for recurrent falls General Gait Details: pt asked to try HHA but is better off with RW at home  Stairs Stairs: (deferred)          Wheelchair Mobility    Modified Rankin (Stroke Patients Only)       Balance Overall balance assessment: Needs assistance;History of Falls Sitting-balance support: Feet supported Sitting balance-Leahy Scale: Good     Standing balance support: Single extremity supported Standing balance-Leahy Scale: Fair Standing balance comment: fair- dynamic standing balance                             Pertinent Vitals/Pain Pain Assessment: Faces Faces Pain Scale: Hurts a little bit Pain Location: R thigh Pain Descriptors / Indicators: Operative site guarding Pain Intervention(s): Monitored during session;Premedicated before session;Repositioned    Home Living Family/patient expects to be discharged to:: Private residence Living Arrangements: Spouse/significant other Available Help at Discharge: Family;Available 24 hours/day Type of Home: House Home Access: Level entry     Home Layout: One level Home Equipment: Walker - 2 wheels Additional Comments: Pt has been home with  RW recently to manage gait    Prior Function Level of Independence: Needs assistance(has sustained falls in recent time over BP)   Gait / Transfers Assistance Needed: mod I with RW prior to her orthostasis  ADL's / Homemaking Assistance Needed: husband has been assisting her with household but could  bathe and dress herself        Hand Dominance   Dominant Hand: Right    Extremity/Trunk Assessment   Upper Extremity Assessment Upper Extremity Assessment: Overall WFL for tasks assessed    Lower Extremity Assessment Lower Extremity Assessment: Generalized weakness    Cervical / Trunk Assessment Cervical / Trunk Assessment: Kyphotic  Communication   Communication: HOH(must be near to understand, has augmented hearing devices)  Cognition Arousal/Alertness: Awake/alert Behavior During Therapy: WFL for tasks assessed/performed Overall Cognitive Status: Within Functional Limits for tasks assessed                                 General Comments: pt is a retired Therapist, sports, very good Mining engineer Comments General comments (skin integrity, edema, etc.): Pt is a mild fall risk but has RW at home    Exercises     Assessment/Plan    PT Assessment Patient needs continued PT services  PT Problem List Decreased strength;Decreased range of motion;Decreased activity tolerance;Decreased balance;Decreased mobility;Decreased coordination;Decreased cognition;Decreased safety awareness;Decreased skin integrity       PT Treatment Interventions DME instruction;Gait training;Functional mobility training;Therapeutic activities;Therapeutic exercise;Balance training;Neuromuscular re-education;Patient/family education    PT Goals (Current goals can be found in the Care Plan section)  Acute Rehab PT Goals Patient Stated Goal: to get home and continue to avoid risk of illness PT Goal Formulation: With patient Time For Goal Achievement: 05/13/19 Potential to Achieve Goals: Good    Frequency Min 3X/week   Barriers to discharge   has husband there but not sure of his cognition    Co-evaluation               AM-PAC PT "6 Clicks" Mobility  Outcome Measure Help needed turning from your back to your side while in a flat bed without using bedrails?: None Help needed  moving from lying on your back to sitting on the side of a flat bed without using bedrails?: A Little Help needed moving to and from a bed to a chair (including a wheelchair)?: A Little Help needed standing up from a chair using your arms (e.g., wheelchair or bedside chair)?: A Little Help needed to walk in hospital room?: A Little Help needed climbing 3-5 steps with a railing? : A Lot 6 Click Score: 18    End of Session Equipment Utilized During Treatment: Gait belt Activity Tolerance: Patient tolerated treatment well;Patient limited by fatigue Patient left: in bed;with call bell/phone within reach;with nursing/sitter in room Nurse Communication: Mobility status PT Visit Diagnosis: Unsteadiness on feet (R26.81);Muscle weakness (generalized) (M62.81);History of falling (Z91.81)    Time: 2409-7353 PT Time Calculation (min) (ACUTE ONLY): 35 min   Charges:   PT Evaluation $PT Eval Moderate Complexity: 1 Mod PT Treatments $Gait Training: 8-22 mins       Ramond Dial 04/29/2019, 12:43 PM  Mee Hives, PT MS Acute Rehab Dept. Number: [place] [facility] [place]

## 2019-04-29 NOTE — Consult Note (Signed)
[facility] Nurse wound consult note Patient receiving care in [place]. Reason for Consult: surgical drain leaking Wound type: drain site from removal of right thigh sarcoma Drainage (amount, consistency, odor) Cloudy white drainage in charged JP bulb.  Leave the right thigh and drain dressings alone. Do not remove them; per the instructions received from her surgeon prior to her arrival to our hospital. The surgeon knows about the wet drain gauze and that it is leaking. She was told to "leave it alone" until she returns to the surgeon's office next Tuesday. You may place dry gauze over the drain area to "reinforce" the dressing, secure with kerlex as often as needed. Thank you for the consult.  Discussed plan of care with the patient.  [facility] nurse will not follow at this time.  Please re-consult the [facility] team if needed.  Val Riles, RN, MSN, CWOCN, CNS-BC, pager [phone]

## 2019-04-29 NOTE — Plan of Care (Signed)
  Problem: Clinical Measurements: Goal: Will remain free from infection Outcome: Adequate for Discharge   Problem: Activity: Goal: Risk for activity intolerance will decrease Outcome: Adequate for Discharge   Problem: Safety: Goal: Ability to remain free from injury will improve Outcome: Adequate for Discharge

## 2019-05-03 DIAGNOSIS — R2241 Localized swelling, mass and lump, right lower limb: Secondary | ICD-10-CM | POA: Diagnosis not present

## 2019-05-03 DIAGNOSIS — Z5189 Encounter for other specified aftercare: Secondary | ICD-10-CM | POA: Diagnosis not present

## 2019-05-05 DIAGNOSIS — G894 Chronic pain syndrome: Secondary | ICD-10-CM | POA: Diagnosis not present

## 2019-05-05 DIAGNOSIS — Z79891 Long term (current) use of opiate analgesic: Secondary | ICD-10-CM | POA: Diagnosis not present

## 2019-05-05 DIAGNOSIS — M79604 Pain in right leg: Secondary | ICD-10-CM | POA: Diagnosis not present

## 2019-05-05 DIAGNOSIS — M19041 Primary osteoarthritis, right hand: Secondary | ICD-10-CM | POA: Diagnosis not present

## 2019-05-09 ENCOUNTER — Encounter: Payer: Self-pay | Admitting: Sports Medicine

## 2019-05-10 DIAGNOSIS — E871 Hypo-osmolality and hyponatremia: Secondary | ICD-10-CM | POA: Diagnosis not present

## 2019-05-10 DIAGNOSIS — C499 Malignant neoplasm of connective and soft tissue, unspecified: Secondary | ICD-10-CM | POA: Diagnosis not present

## 2019-05-10 DIAGNOSIS — R600 Localized edema: Secondary | ICD-10-CM | POA: Diagnosis not present

## 2019-05-10 DIAGNOSIS — D649 Anemia, unspecified: Secondary | ICD-10-CM | POA: Diagnosis not present

## 2019-05-12 DIAGNOSIS — R2241 Localized swelling, mass and lump, right lower limb: Secondary | ICD-10-CM | POA: Diagnosis not present

## 2019-05-12 DIAGNOSIS — C499 Malignant neoplasm of connective and soft tissue, unspecified: Secondary | ICD-10-CM | POA: Diagnosis not present

## 2019-05-12 DIAGNOSIS — C4921 Malignant neoplasm of connective and soft tissue of right lower limb, including hip: Secondary | ICD-10-CM | POA: Diagnosis not present

## 2019-05-12 DIAGNOSIS — Z9889 Other specified postprocedural states: Secondary | ICD-10-CM | POA: Diagnosis not present

## 2019-05-30 ENCOUNTER — Telehealth: Payer: Self-pay | Admitting: Radiation Oncology

## 2019-05-30 NOTE — Telephone Encounter (Signed)
-----   Message from Hayden Pedro, PA-C sent at 05/30/2019  2:23 PM EDT ----- Regarding: RE: High grade Liposarcoma We treat liposarcomas. I don't know of any radiation depts who only specialize in sarcomas but that would be a Dr. Tammi Klippel question. Its my understanding that liposarcomas are more common than other types, and there are some medical oncologists who specialize in sarcomas. Dr. Kendall Flack at [facility] is the one I've heard the most about.  ----- Message ----- From: Heywood Footman, RN Sent: 05/30/2019   2:15 PM EDT To: Hayden Pedro, PA-C Subject: High grade Liposarcoma                         Bryson Ha.  I received a call from this patient's daughter, Ivin Booty. She explained her mother was diagnosed with a high grade liposarcoma. It appears the liposarcoma was removed on 04/04/2019 from her posterior left distal thigh. Ivin Booty explained her understanding that her mother needs radiation. She questions if we have taken care of liposarcomas in the past and if so, how many? She questions if there is a place specifically that treats/specializes in the treatment of liposarcomas. I am glad to phone her back but need a little help formulating the appropriate response.   Sam

## 2019-05-30 NOTE — Telephone Encounter (Signed)
Phoned daughter, Ivin Booty, back. Explained we do treat liposarcomas. Explained I am uncertain of a radiation department that specializes in sarcomas. Explained Dr. Kendall Flack is a medical oncologist at [facility] that specializes in sarcomas though. Daughter verbalizes her understanding that her mother initially saw Dr. Barry Dienes who referred her to [facility] for surgery. Daughter explains a 12 cm stage III B was removed along with the involved muscle from her distal left posterior thigh. She reports surgeon Magda Bernheim encouraged radiation since they couldn't be certain of the margins. In addition, Dr. Morene Rankins consulted her mother after receiving the referral from surgeon Redmond Pulling. Ivin Booty went onto explain that her parents have an appointment with [facility] for a second opinion "since they are both [employer] graduates" but are certain they would want radiation closer to home. Explained we would be glad to help facilitate radiation treatment for her mother. Provided Ivin Booty with my direct contact information. Encouraged her to call with additional questions and/or once second opinion is received from [facility]. Ivin Booty verbalized understanding of all reviewed and expressed appreciation for the return call.

## 2019-05-31 DIAGNOSIS — R2241 Localized swelling, mass and lump, right lower limb: Secondary | ICD-10-CM | POA: Diagnosis not present

## 2019-05-31 DIAGNOSIS — C4921 Malignant neoplasm of connective and soft tissue of right lower limb, including hip: Secondary | ICD-10-CM | POA: Diagnosis not present

## 2019-06-03 DIAGNOSIS — C4921 Malignant neoplasm of connective and soft tissue of right lower limb, including hip: Secondary | ICD-10-CM | POA: Diagnosis not present

## 2019-06-03 DIAGNOSIS — M799 Soft tissue disorder, unspecified: Secondary | ICD-10-CM | POA: Diagnosis not present

## 2019-06-03 DIAGNOSIS — R590 Localized enlarged lymph nodes: Secondary | ICD-10-CM | POA: Diagnosis not present

## 2019-06-09 DIAGNOSIS — C4921 Malignant neoplasm of connective and soft tissue of right lower limb, including hip: Secondary | ICD-10-CM | POA: Diagnosis not present

## 2019-06-09 DIAGNOSIS — C499 Malignant neoplasm of connective and soft tissue, unspecified: Secondary | ICD-10-CM | POA: Diagnosis not present

## 2019-06-10 DIAGNOSIS — D481 Neoplasm of uncertain behavior of connective and other soft tissue: Secondary | ICD-10-CM | POA: Diagnosis not present

## 2019-06-10 DIAGNOSIS — C4921 Malignant neoplasm of connective and soft tissue of right lower limb, including hip: Secondary | ICD-10-CM | POA: Diagnosis not present

## 2019-06-13 ENCOUNTER — Ambulatory Visit
Admission: RE | Admit: 2019-06-13 | Discharge: 2019-06-13 | Disposition: A | Discharge status: Home / Self Care | Payer: Medicare Other | Source: Ambulatory Visit | Attending: Radiation Oncology | Admitting: Radiation Oncology

## 2019-06-13 ENCOUNTER — Telehealth: Payer: Self-pay | Admitting: Radiation Oncology

## 2019-06-13 ENCOUNTER — Other Ambulatory Visit: Payer: Self-pay

## 2019-06-13 ENCOUNTER — Encounter: Payer: Self-pay | Admitting: Radiation Oncology

## 2019-06-13 VITALS — BP 155/54 | HR 66 | Temp 98.2°F | Resp 20 | Ht 61.0 in | Wt 154.4 lb

## 2019-06-13 DIAGNOSIS — Z86010 Personal history of colonic polyps: Secondary | ICD-10-CM | POA: Insufficient documentation

## 2019-06-13 DIAGNOSIS — I10 Essential (primary) hypertension: Secondary | ICD-10-CM | POA: Diagnosis not present

## 2019-06-13 DIAGNOSIS — M25561 Pain in right knee: Secondary | ICD-10-CM | POA: Diagnosis not present

## 2019-06-13 DIAGNOSIS — Z9889 Other specified postprocedural states: Secondary | ICD-10-CM | POA: Diagnosis not present

## 2019-06-13 DIAGNOSIS — F419 Anxiety disorder, unspecified: Secondary | ICD-10-CM | POA: Diagnosis not present

## 2019-06-13 DIAGNOSIS — Z79899 Other long term (current) drug therapy: Secondary | ICD-10-CM | POA: Diagnosis not present

## 2019-06-13 DIAGNOSIS — M129 Arthropathy, unspecified: Secondary | ICD-10-CM | POA: Insufficient documentation

## 2019-06-13 DIAGNOSIS — C4921 Malignant neoplasm of connective and soft tissue of right lower limb, including hip: Secondary | ICD-10-CM | POA: Diagnosis not present

## 2019-06-13 DIAGNOSIS — K759 Inflammatory liver disease, unspecified: Secondary | ICD-10-CM | POA: Insufficient documentation

## 2019-06-13 NOTE — Progress Notes (Signed)
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 The patient underwent CT of the RLE(03/29/19) which revealed a 5.9 x 5.4 x 11.5 cm intramuscular mass in the distal right sartorius muscle concerning for liposarcoma. He underwent a core needle biopsy of the distal left posterior thigh mass(04/04/19) which evinced a high grade liposarcoma. The patient is now status post radical resection of soft tissue tumor in the right thigh(04/15/19). The surgical pathology was remarkable for a high grade liposarcoma. There was no high grade liposarcoma at the margins but the pathologist can't exclude low grade liposarcoma at the margins.   Past/Anticipated chemotherapy by medical oncology, if any: None at this time.  Patient's main complaints related to symptomatic tumor(s) are: Pt reports lower leg continues to swell as surgery was behind knee.   Pain on a scale of 0-10 is: Pt reports RIGHT knee pain, arthritic in nature, rated 3/10   Ambulatory status? Walker? Wheelchair?: Pt ambulatory without assistive device. Pt with multiple falls in past 3 months.   SAFETY ISSUES:  Prior radiation? No  Pacemaker/ICD? No  Possible current pregnancy? No  Is the patient on methotrexate? No  Additional Complaints / other details:  Pt presents today for initial consult with Dr. Sondra Come for Radiation Oncology. Pt is accompanied by husband due to cognitive concerns.  BP (!) 155/54 (BP Location: Right Arm, Patient Position: Sitting)   Pulse 66   Temp 98.2 F (36.8 C) (Oral)   Resp 20   Ht 5\' 1"  (1.549 m)   Wt 154 lb 6.4 oz (70 kg)   SpO2 100%   BMI 29.17 kg/m   Wt Readings from Last 3 Encounters:  06/13/19 154 lb 6.4 oz (70 kg)  04/29/19 151 lb 14.4 oz (68.9 kg)  04/04/19 150  lb (68 kg)   Loma Sousa, RN BSN

## 2019-06-13 NOTE — Telephone Encounter (Signed)
Received voicemail message from patient's daughter, Ivin Booty, requesting reassurance about her mother's consultation being with Dr. Sondra Come instead of Dr. Tammi Klippel. She verbalized the family felt comfortable with Dr. Tammi Klippel since he works out at the same gym as her brother who is a Pharmacist, community in town. Phoned her back. No answer. Left voicemail message reassuring her that Dr. Sondra Come is an excellent radiation oncologist with a phenomenal bedside manner. Assured her that her mother is in excellent hands.

## 2019-06-13 NOTE — Progress Notes (Addendum)
Radiation Oncology         [phone] ________________________________  Initial Outpatient Consultation  Name: Destiny [name] MRN: [id number]  Date: 06/13/2019  DOB: [birth year]  IR:JJOAC, Thayer Jew, MD  Gus Puma, MD   REFERRING PHYSICIAN: Gus Puma, MD  DIAGNOSIS: The encounter diagnosis was Liposarcoma of right thigh [facility]).   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 female who is presenting to the office today for evaluation of liposarcoma on her right thigh. She is accompanied by her husband today due to cognitive concerns. She originally presented with a 1 month history of worsening swelling, redness and pain in the posterior medial aspect of the right thigh.   The patient underwent CT of the RLE(03/29/19) which revealed a 5.9 x 5.4 x 11.5 cm intramuscular mass in the distal right sartorius muscle concerning for liposarcoma.   She then underwent a core needle biopsy of the distal left posterior thigh mass(04/04/19) which revealed dedifferentiated liposarcoma, high grade.   The patient is now status post radical resection of soft tissue tumor in the right thigh(04/15/19) completed at [facility]. The surgical pathology was remarkable for a high grade liposarcoma. There was no high grade liposarcoma at the margins but the pathologist can't exclude low grade liposarcoma at the margins.  . Pathology showed 12 x 6.5 x 5.5 cm dedifferentiated liposarcoma, FNCLCC grade 3, 60% necrosis, mitotic rate 6/10 HPF, no LVI. Margins "cannot be fully assessed: no dedifferentiated sarcoma at margin; cannot exclude well-differentiated component at margin." MDM2 amplification detected on FISH.   She suffered a fall on 04/28/19 and was admitted to the ED overnight for observation.   she reports associated continued swelling in lower right leg. She reports right knee pain, 3/10, arthritic in nature. she denies any other  symptoms.   Patient was seen in consultation at [facility] for consideration for postoperative radiation therapy to reduce chances for local recurrence.  She was seen by Dr. Iona Beard who recommended postoperative radiation therapy.  The patient is also seen at [facility] (Dr. De Nurse) who also recommended postoperative radiation therapy.  Given the patient's residence in the [place] area she is now referred to [facility] for her radiation therapy.   PREVIOUS RADIATION THERAPY: No  PAST MEDICAL HISTORY:  has a past medical history of Anxiety, Arthritis, Colon polyps, Complication of anesthesia, Hepatitis, Hypertension, Hypothyroidism, and Liposarcoma of right thigh ([place]) (03/2019).    PAST SURGICAL HISTORY: Past Surgical History:  Procedure Laterality Date  . ABDOMINAL HYSTERECTOMY    . APPENDECTOMY    . BACK SURGERY      fusion  . CESAREAN SECTION     times 2  . EYE SURGERY Left    cataract removed  . FINGER ARTHRODESIS Left 10/02/2017   Procedure: LEFT LONG FINGER ARTHRODESIS;  Surgeon: Milly Jakob, MD;  Location: [facility];  Service: Orthopedics;  Laterality: Left;  . FOOT SURGERY Bilateral   . FRACTURE SURGERY     broken ankle  . MEDIAL PARTIAL KNEE REPLACEMENT Left    done at [place] Right 12/28/2014   Procedure: REVERSE SHOULDER ARTHROPLASTY;  Surgeon: Nita Sells, MD;  Location: [facility];  Service: Orthopedics;  Laterality: Right;  Right reverse total shoulder arthroplasty  . REVERSE TOTAL SHOULDER ARTHROPLASTY Right 12/28/2014   DR CHANDLER  . rotator cufff    . THYROIDECTOMY, PARTIAL  one lobe removed  . VARICOSE VEIN SURGERY Right     FAMILY HISTORY: family history includes Atrial fibrillation in her mother; Cancer - Other in her father; Heart disease in her mother.  SOCIAL HISTORY:  reports that she has never smoked. She has never used smokeless tobacco. She reports current alcohol  use. She reports that she does not use drugs.  ALLERGIES: Nsaids, Oxycodone-acetaminophen, Aspirin, Augmentin [amoxicillin-pot clavulanate], Ciprocin-fluocin-procin [fluocinolone], Ciprofloxacin hcl, Fentanyl, Fluclorolone, Lyrica [pregabalin], Oxycodone, Versed [midazolam], Diclofenac sodium, Hydromorphone hcl, and Voltaren gel [diclofenac sodium]  MEDICATIONS:  Current Outpatient Medications  Medication Sig Dispense Refill  . acetaminophen (TYLENOL) 500 MG tablet Take 500 mg by mouth every 4 (four) hours as needed.    Marland Kitchen amLODipine (NORVASC) 5 MG tablet Take 5 mg by mouth daily.     Marland Kitchen b complex vitamins capsule Take 1 capsule by mouth daily.    . Calcium Carb-Cholecalciferol 600-200 MG-UNIT TABS Take 1 tablet by mouth daily.     . cholecalciferol (VITAMIN D) 1000 UNITS tablet Take 2,000 Units by mouth daily.    . ferrous sulfate 325 (65 FE) MG tablet Take 325 mg by mouth daily with breakfast.    . gabapentin (NEURONTIN) 300 MG capsule Take 600-900 mg by mouth See admin instructions. Take 300 mg every morning and900 mg at bedtime    . HYDROcodone-acetaminophen (NORCO/VICODIN) 5-325 MG tablet Take 1 tablet by mouth every 4 (four) hours as needed for moderate pain or severe pain.     Marland Kitchen ipratropium (ATROVENT) 0.06 % nasal spray Place 1 spray into the nose as needed.    Marland Kitchen levothyroxine (SYNTHROID, LEVOTHROID) 125 MCG tablet Take 125 mcg by mouth daily before breakfast.     . loratadine-pseudoephedrine (CLARITIN-D 12-HOUR) 5-120 MG per tablet Take 1 tablet by mouth daily.    . methocarbamol (ROBAXIN) 750 MG tablet Take 750 mg by mouth 2 (two) times daily as needed for muscle spasms.     . metoprolol succinate (TOPROL-XL) 50 MG 24 hr tablet Take 50 mg by mouth 2 (two) times daily.     . nitrofurantoin, macrocrystal-monohydrate, (MACROBID) 100 MG capsule Take 100 mg by mouth daily.     Marland Kitchen olmesartan (BENICAR) 40 MG tablet Take 40 mg by mouth daily.    Marnee Guarneri Isoflavones (SOY BALANCE PO) Take 1  tablet by mouth daily. Soy bean extract    . tolterodine (DETROL LA) 4 MG 24 hr capsule Take 4 mg by mouth daily.    . traZODone (DESYREL) 50 MG tablet Take 50 mg by mouth as needed.    . zolpidem (AMBIEN) 10 MG tablet Take 5 mg by mouth at bedtime as needed for sleep.   1  . aspirin EC 81 MG tablet Take 81 mg by mouth 2 (two) times a day.    Marland Kitchen MYRBETRIQ 50 MG TB24 tablet Take 50 mg by mouth at bedtime.    . senna-docusate (SENOKOT-S) 8.6-50 MG tablet Take 2 tablets by mouth 2 (two) times a day.     No current facility-administered medications for this encounter.     REVIEW OF SYSTEMS:  A 10+ POINT REVIEW OF SYSTEMS WAS OBTAINED including neurology, dermatology, psychiatry, cardiac, respiratory, lymph, extremities, GI, GU, musculoskeletal, constitutional, reproductive, HEENT. All pertinent positives are noted in the HPI. All others are negative.    PHYSICAL EXAM:  height is 5\' 1"  (1.549 m) and weight is 154 lb 6.4 oz (70 kg). Her oral temperature is 98.2 F (36.8 C). Her blood pressure  is 155/54 (abnormal) and her pulse is 66. Her respiration is 20 and oxygen saturation is 100%.   General: Alert and oriented, in no acute distress HEENT: Head is normocephalic. Extraocular movements are intact. Oropharynx is clear. Neck: Neck is supple, no palpable cervical or supraclavicular lymphadenopathy. Heart: Regular in rate and rhythm with no murmurs, rubs, or gallops. Chest: Clear to auscultation bilaterally, with no rhonchi, wheezes, or rales. Abdomen: Soft, nontender, nondistended, with no rigidity or guarding. Extremities: No cyanosis. Pt has some swelling in her right lower extremity, with some pitting edema.  Lymphatics: see Neck Exam Skin: No concerning lesions. Musculoskeletal: symmetric strength and muscle tone throughout. Neurologic: Cranial nerves II through XII are grossly intact. No obvious focalities. Speech is fluent. Coordination is intact. Psychiatric: Judgment and insight are  intact. Affect is appropriate. Examination of the inguinal areas bilaterally reveals no palpable adenopathy. Examination of the right thigh reveals a scar along the  medial aspect extending over approximately 20-25 cm, extending into the upper medial knee region. Scar is well-healed without any signs of drainage or infection.  Single drain site inferior to this scar. She walks with the assistance of a cane at this time.  ECOG = 1   LABORATORY DATA:  Lab Results  Component Value Date   WBC 12.9 (H) 04/29/2019   HGB 8.5 (L) 04/29/2019   HCT 25.7 (L) 04/29/2019   MCV 97.3 04/29/2019   PLT 441 (H) 04/29/2019   NEUTROABS 15.2 (H) 04/28/2019   Lab Results  Component Value Date   NA 132 (L) 04/29/2019   K 3.7 04/29/2019   CL 99 04/29/2019   CO2 24 04/29/2019   GLUCOSE 100 (H) 04/29/2019   CREATININE 0.75 04/29/2019   CALCIUM 8.5 (L) 04/29/2019      RADIOGRAPHY: No results found.    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 I would agree with recommendations from [facility] and [facility] concerning post-operative radiation treatment.   Today, I talked to the patient and husband about the findings and work-up thus far.  We discussed the natural history of high grade sarcomas and general treatment, highlighting the role of radiotherapy in the management.  We discussed the available radiation techniques, and focused on the details of logistics and delivery.  We reviewed the anticipated acute and late sequelae associated with radiation in this setting.  The patient was encouraged to ask questions that I answered to the best of my ability.  A patient consent form was discussed and signed.  We retained a copy for our records.  The patient would like to proceed with radiation and will be scheduled for CT simulation.   PLAN: Pt will return of July 22nd for  CT simulation with treatments to begin 7-10 days later. Anticipate approximately 7 weeks of radiation therapy. The pt would like to be set up for a physical therapy evaluation and will place an order for this. She is having some difficulty with ambulation but is able to manage with use of a cane.    ------------------------------------------------  Blair Promise, PhD, MD   This document serves as a record of services personally performed by Gery Pray, MD. It was created on his behalf by Mary-Margaret Loma Messing, a trained medical scribe. The creation of this record is based on the scribe's personal observations and the provider's statements to them. This  document has been checked and approved by the attending provider.

## 2019-06-13 NOTE — Patient Instructions (Signed)
Coronavirus (COVID-19) Are you at risk?  Are you at risk for the Coronavirus (COVID-19)?  To be considered HIGH RISK for Coronavirus (COVID-19), you have to meet the following criteria:  . Traveled to [place], [place], [place], [place] or [place]; or in the [place] to [place], [place], [place], or [place]; and have fever, cough, and shortness of breath within the last 2 weeks of travel OR . Been in close contact with a person diagnosed with COVID-19 within the last 2 weeks and have fever, cough, and shortness of breath . IF YOU DO NOT MEET THESE CRITERIA, YOU ARE CONSIDERED LOW RISK FOR COVID-19.  What to do if you are HIGH RISK for COVID-19?  . If you are having a medical emergency, call 911. . Seek medical care right away. Before you go to a doctor's office, urgent care or emergency department, call ahead and tell them about your recent travel, contact with someone diagnosed with COVID-19, and your symptoms. You should receive instructions from your physician's office regarding next steps of care.  . When you arrive at healthcare provider, tell the healthcare staff immediately you have returned from visiting [place], [place], [place], [place] or [place]; or traveled in the [place] to [place], [place], [place], or [place]; in the last two weeks or you have been in close contact with a person diagnosed with COVID-19 in the last 2 weeks.   . Tell the health care staff about your symptoms: fever, cough and shortness of breath. . After you have been seen by a medical provider, you will be either: o Tested for (COVID-19) and discharged home on quarantine except to seek medical care if symptoms worsen, and asked to  - Stay home and avoid contact with others until you get your results (4-5 days)  - Avoid travel on public transportation if possible (such as bus, train, or airplane) or o Sent to the Emergency Department by EMS for evaluation, COVID-19 testing, and possible  admission depending on your condition and test results.  What to do if you are LOW RISK for COVID-19?  Reduce your risk of any infection by using the same precautions used for avoiding the common cold or flu:  . Wash your hands often with soap and warm water for at least 20 seconds.  If soap and water are not readily available, use an alcohol-based hand sanitizer with at least 60% alcohol.  . If coughing or sneezing, cover your mouth and nose by coughing or sneezing into the elbow areas of your shirt or coat, into a tissue or into your sleeve (not your hands). . Avoid shaking hands with others and consider head nods or verbal greetings only. . Avoid touching your eyes, nose, or mouth with unwashed hands.  . Avoid close contact with people who are sick. . Avoid places or events with large numbers of people in one location, like concerts or sporting events. . Carefully consider travel plans you have or are making. . If you are planning any travel outside or inside the [place], visit the CDC's Travelers' Health webpage for the latest health notices. . If you have some symptoms but not all symptoms, continue to monitor at home and seek medical attention if your symptoms worsen. . If you are having a medical emergency, call 911.   ADDITIONAL HEALTHCARE OPTIONS FOR PATIENTS  [facility] / e-Visit: [email or web address]         [facility]: [phone]  [facility]: [phone]                   [facility]: [phone]   

## 2019-06-15 ENCOUNTER — Ambulatory Visit
Admission: RE | Admit: 2019-06-15 | Discharge: 2019-06-15 | Disposition: A | Discharge status: Home / Self Care | Payer: Medicare Other | Source: Ambulatory Visit | Attending: Radiation Oncology | Admitting: Radiation Oncology

## 2019-06-15 ENCOUNTER — Other Ambulatory Visit: Payer: Self-pay

## 2019-06-15 DIAGNOSIS — Z510 Encounter for antineoplastic radiation therapy: Secondary | ICD-10-CM | POA: Diagnosis not present

## 2019-06-15 DIAGNOSIS — C4921 Malignant neoplasm of connective and soft tissue of right lower limb, including hip: Secondary | ICD-10-CM | POA: Diagnosis not present

## 2019-06-20 ENCOUNTER — Ambulatory Visit: Payer: Medicare Other | Attending: Radiation Oncology | Admitting: Rehabilitation

## 2019-06-20 ENCOUNTER — Encounter: Payer: Self-pay | Admitting: Rehabilitation

## 2019-06-20 ENCOUNTER — Other Ambulatory Visit: Payer: Self-pay

## 2019-06-20 DIAGNOSIS — R2689 Other abnormalities of gait and mobility: Secondary | ICD-10-CM | POA: Diagnosis not present

## 2019-06-20 DIAGNOSIS — I890 Lymphedema, not elsewhere classified: Secondary | ICD-10-CM | POA: Diagnosis not present

## 2019-06-20 DIAGNOSIS — M6281 Muscle weakness (generalized): Secondary | ICD-10-CM

## 2019-06-20 NOTE — Patient Instructions (Signed)
Attempt [place] at home safely, add seated march  Continue walking and mobility throughout radiation  Please get velcro or thigh high garment for swelling or return to clinic to learn bandaging.

## 2019-06-20 NOTE — Therapy (Signed)
[place], [place], [address] Phone: [phone]   Fax:  [phone]  Physical Therapy Evaluation  Patient Details  Name: Destiny [name] MRN: [id number] Date of Birth: 1941/10/12 Referring Provider (PT): Dr. Sondra Come   Encounter Date: 06/20/2019  PT End of Session - 06/20/19 1707    Visit Number  1    Number of Visits  1    PT Start Time  1400    PT Stop Time  1450    PT Time Calculation (min)  50 min    Activity Tolerance  Patient tolerated treatment well    Behavior During Therapy  [facility] for tasks assessed/performed       Past Medical History:  Diagnosis Date  . Anxiety   . Arthritis   . Colon polyps   . Complication of anesthesia    BP drops with fentyl and versed  . Hepatitis    hep A  1976  . Hypertension   . Hypothyroidism   . Liposarcoma of right thigh ([place]) 03/2019    Past Surgical History:  Procedure Laterality Date  . ABDOMINAL HYSTERECTOMY    . APPENDECTOMY    . BACK SURGERY      fusion  . CESAREAN SECTION     times 2  . EYE SURGERY Left    cataract removed  . FINGER ARTHRODESIS Left 10/02/2017   Procedure: LEFT LONG FINGER ARTHRODESIS;  Surgeon: Milly Jakob, MD;  Location: [place];  Service: Orthopedics;  Laterality: Left;  . FOOT SURGERY Bilateral   . FRACTURE SURGERY     broken ankle  . MEDIAL PARTIAL KNEE REPLACEMENT Left    done at [place] Right 12/28/2014   Procedure: REVERSE SHOULDER ARTHROPLASTY;  Surgeon: Nita Sells, MD;  Location: [place];  Service: Orthopedics;  Laterality: Right;  Right reverse total shoulder arthroplasty  . REVERSE TOTAL SHOULDER ARTHROPLASTY Right 12/28/2014   DR CHANDLER  . rotator cufff    . THYROIDECTOMY, PARTIAL     one lobe removed  . VARICOSE VEIN SURGERY Right     There were no vitals filed for this visit.   Subjective Assessment - 06/20/19 1401    Subjective  Pt  presents with difficulty walking, "I can't raise my leg right".  I fell once 2 months ago and had a fainting episode.  My whole leg was swollen but  it was improved    Pertinent History  Liposarcoma of the Rt thigh 11.5cm intramuscular mass Rt distal sartorius. Radical resection performed 04/15/19.  Noted per surgical notes WBAT and PT for gait and balance.  History includes: hysterectomy, lumbar fusion, Rt reverse TSR, OA and neck pain    Patient Stated Goals  driving, not use the cane,    Currently in Pain?  No/denies   only body OA pain        [facility] PT Assessment - 06/20/19 0001      Assessment   Medical Diagnosis  Rt liposarcoma    Referring Provider (PT)  Dr. Sondra Come    Onset Date/Surgical Date  04/15/19    Hand Dominance  Right      Precautions   Precaution Comments  lymphedema and fall      Restrictions   Weight Bearing Restrictions  No      Balance Screen   Has the patient fallen in the past 6 months  Yes    How many times?  2  Has the patient had a decrease in activity level because of a fear of falling?   No    Is the patient reluctant to leave their home because of a fear of falling?   No      Home Film/video editor residence    Living Arrangements  Spouse/significant other    Home Access  Level entry    [place]  One level    Additional Comments  SPC      Prior Function   Level of [place]  Retired    Leisure  walking      Cognition   Overall Cognitive Status  Within Functional Limits for tasks assessed      Observation/Other Assessments   Observations  lymphedema present Rt LE radiation markers present    Skin Integrity  pitting edema Rt lower leg      Sensation   Additional Comments  numbness lower leg       Coordination   Gross Motor Movements are Fluid and Coordinated  Yes      Functional Tests   Functional tests  Sit to Stand      Sit to Stand   Comments  8 times in 30 seconds       Posture/Postural Control   Posture/Postural Control  No significant limitations      ROM / Strength   AROM / PROM / Strength  AROM;Strength      AROM   Overall AROM Comments  hip and knee AROM WNL except for seated hip flexion which was limited without lower leg ER and then about 75%.  SLR was normal with hip ER added and minimal without      Strength   Strength Assessment Site  Knee;Hip    Right/Left Hip  Right;Left    Right Hip Flexion  3-/5    Right Hip External Rotation   4-/5    Right Hip Internal Rotation  4-/5    Right Hip ABduction  4-/5    Right Hip ADduction  4/5    Left Hip Flexion  4+/5    Left Hip External Rotation  4/5    Left Hip Internal Rotation  4/5    Left Hip ABduction  4-/5    Right/Left Knee  Right;Left      Transfers   Comments  TUG 14.3sec without AD      Ambulation/Gait   Gait Comments  ambulates with SPC outside and no AD indoors without sig deviation except for ER of the Rt LE to assist with hip flexion. Pt reports she just feels "not right" due to numbness, swelling, and weakness      Balance   Balance Assessed  Yes      Standardized Balance Assessment   Standardized Balance Assessment  Berg Balance Test      Berg Balance Test   Sit to Stand  Able to stand without using hands and stabilize independently    Standing Unsupported  Able to stand safely 2 minutes    Sitting with Back Unsupported but Feet Supported on Floor or Stool  Able to sit safely and securely 2 minutes    Stand to Sit  Sits safely with minimal use of hands    Transfers  Able to transfer safely, minor use of hands    Standing Unsupported with Eyes Closed  Able to stand 10 seconds safely    Standing Unsupported with Feet Together  Able to place feet together independently and stand for 1 minute with supervision    From Standing, Reach Forward with Outstretched Arm  Can reach confidently >25 cm (10")    From Standing Position, Pick up Object from [place] to pick up shoe safely  and easily    From Standing Position, Turn to Look Behind Over each Shoulder  Looks behind from both sides and weight shifts well    Turn 360 Degrees  Able to turn 360 degrees safely in 4 seconds or less    Standing Unsupported, Alternately Place Feet on Step/Stool  Able to stand independently and complete 8 steps >20 seconds    Standing Unsupported, One Foot in Front  Needs help to step but can hold 15 seconds   Rt foot in front 30", Lt foot in front 10"    Standing on One Leg  Tries to lift leg/unable to hold 3 seconds but remains standing independently    Total Score  48                Objective measurements completed on examination: See above findings.      [facility] Adult PT Treatment/Exercise - 06/20/19 0001      Exercises   Exercises  Other Exercises    Other Exercises   briefly educated pt on [place] program with paper handout education on performing things safely at the counter and with husband present if needed.        Manual Therapy   Manual Therapy  Edema management    Edema Management  PT with whole leg swelling/lymphedema present but with pt declining future visits despite recommending this.  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  Gave pt information on getting a thigh high garment at [place] or velcro and to return here for wrapping instruction and material education.              PT Education - 06/20/19 1706    Education Details  HEP, Merrilee Jansky results, POC    Person(s) Educated  Patient;Spouse    Methods  Explanation    Comprehension  Verbalized understanding          PT Long Term Goals - 06/20/19 1722      PT LONG TERM GOAL #1   Title  be independent in HEP    Status  Achieved      PT LONG TERM GOAL #2   Title  be educated on how to treat lymphedema    Status  Achieved             Plan - 06/20/19 1707    Clinical Impression Statement  Pt presents after Rt lower extremity radical resection of the sartiorius  muscle due to liposarcoma wanting to get exercises to do at home.  Evaluation was fully completed before pt noted that she did not want any more visits so exercise instruction on the [facility]TAG[employer] was brief with handout.  Safey considerations understood by patient.  Reviewed BERG results with recommendations to still use the [facility] outside.  Pt also with whole LE lymphedema evident with 2+ pitting edema pt educated that pt should get a velcro garment, thigh high stocking, or learn how to wrap in clinic.  Pt against any more visits at this time.  BERG of 46 demonstrating low fall risk, TUG slightly slower than age norms, and sit to stand x 8 age matched normal. Pt encouraged to follow  up regarding lymphedema and more strengthening but not agreeable at this time. Unable to get lymphedema measurements as pt reported she was just interested in strengthening.    Personal Factors and Comorbidities  Age;Fitness;Behavior Pattern;Comorbidity 3+    Comorbidities  surgical resection, getting radiation, OA causing knee pain    Examination-Activity Limitations  Other   driving   Examination-Participation Restrictions  Yard Work;Community Activity;Driving    Stability/Clinical Decision Making  Stable/Uncomplicated    Clinical Decision Making  Low    Rehab Potential  Poor    PT Frequency  One time visit    PT Treatment/Interventions  ADLs/Self Care Home Management;Therapeutic exercise    PT Next Visit Plan  if pt returns, bandaging instruction for LE or garment check, gait and balance, hip strengthening    PT Home Exercise Plan  OTAGO    Consulted and Agree with Plan of Care  Patient       Patient will benefit from skilled therapeutic intervention in order to improve the following deficits and impairments:  Abnormal gait, Decreased activity tolerance, Decreased strength, Increased edema, Decreased balance  Visit Diagnosis: 1. Lymphedema   2. Other abnormalities of gait and mobility   3. Muscle weakness (generalized)         Problem List Patient Active Problem List   Diagnosis Date Noted  . Near syncope 04/28/2019  . Adjustment disorder with mixed anxiety and depressed mood 04/28/2019  . Liposarcoma of right thigh ([place]) 04/28/2019  . Osteoarthritis of right knee 03/10/2019  . Hip pain, bilateral 01/07/2018  . Sinus tarsi syndrome 08/20/2016  . Right knee pain 07/17/2016  . S/p reverse total shoulder arthroplasty 12/28/2014  . Abnormality of gait 04/25/2014  . Foot pain 06/03/2011  . Metatarsalgia 06/03/2011  . Chronic pain syndrome 12/05/2009  . INSOMNIA 08/15/2009  . INGROWN TOENAIL 06/06/2009  . LEG PAIN, BILATERAL 06/05/2009  . OTHER VITAMIN B12 DEFICIENCY ANEMIA 04/04/2009  . MALAISE AND FATIGUE 02/28/2009  . Hypothyroidism 07/26/2008  . HYPERLIPIDEMIA 11/05/2007  . NEUROPATHY, IDIOPATHIC PERIPHERAL NEC 09/16/2007  . Essential hypertension 01/28/2007  . Arthritis of hand, degenerative 01/28/2007  . ROTATOR CUFF INJURY, RIGHT SHOULDER 01/28/2007  . OSTEOPENIA 01/28/2007    Stark Bray 06/20/2019, 5:23 PM  [place], [place], [address] Phone: [phone]   Fax:  [phone]  Name: GUERLINE HAPP MRN: [id number] Date of Birth: 1940/12/08

## 2019-06-22 ENCOUNTER — Ambulatory Visit: Payer: Medicare Other | Admitting: Radiation Oncology

## 2019-06-23 ENCOUNTER — Ambulatory Visit: Payer: Medicare Other

## 2019-06-23 DIAGNOSIS — H40013 Open angle with borderline findings, low risk, bilateral: Secondary | ICD-10-CM | POA: Diagnosis not present

## 2019-06-23 DIAGNOSIS — Z510 Encounter for antineoplastic radiation therapy: Secondary | ICD-10-CM | POA: Diagnosis not present

## 2019-06-23 DIAGNOSIS — C4921 Malignant neoplasm of connective and soft tissue of right lower limb, including hip: Secondary | ICD-10-CM | POA: Diagnosis not present

## 2019-06-24 ENCOUNTER — Ambulatory Visit: Payer: Medicare Other

## 2019-06-27 ENCOUNTER — Ambulatory Visit
Admission: RE | Admit: 2019-06-27 | Discharge: 2019-06-27 | Disposition: A | Discharge status: Home / Self Care | Payer: Medicare Other | Source: Ambulatory Visit | Attending: Radiation Oncology | Admitting: Radiation Oncology

## 2019-06-27 ENCOUNTER — Other Ambulatory Visit: Payer: Self-pay

## 2019-06-27 DIAGNOSIS — Z510 Encounter for antineoplastic radiation therapy: Secondary | ICD-10-CM | POA: Diagnosis not present

## 2019-06-27 DIAGNOSIS — C4921 Malignant neoplasm of connective and soft tissue of right lower limb, including hip: Secondary | ICD-10-CM | POA: Diagnosis not present

## 2019-06-27 NOTE — Progress Notes (Signed)
  Radiation Oncology         [phone] ________________________________  Name: QUANDRA FEDORCHAK MRN: [id number]  Date: 06/27/2019  DOB: 08-19-1941  Simulation Verification Note    ICD-10-CM   1. Liposarcoma of right thigh ([facility])  C49.21     Status: outpatient  NARRATIVE: The patient was brought to the treatment unit and placed in the planned treatment position. The clinical setup was verified. Then port films were obtained and uploaded to the radiation oncology medical record software.  The treatment beams were carefully compared against the planned radiation fields. The position location and shape of the radiation fields was reviewed. They targeted volume of tissue appears to be appropriately covered by the radiation beams. Organs at risk appear to be excluded as planned.  Based on my personal review, I approved the simulation verification. The patient's treatment will proceed as planned.  -----------------------------------  Blair Promise, PhD, MD  This document serves as a record of services personally performed by Gery Pray, MD. It was created on his behalf by Wilburn Mylar, a trained medical scribe. The creation of this record is based on the scribe's personal observations and the provider's statements to them. This document has been checked and approved by the attending provider.

## 2019-06-28 ENCOUNTER — Ambulatory Visit
Admission: RE | Admit: 2019-06-28 | Discharge: 2019-06-28 | Disposition: A | Discharge status: Home / Self Care | Payer: Medicare Other | Source: Ambulatory Visit | Attending: Radiation Oncology | Admitting: Radiation Oncology

## 2019-06-28 ENCOUNTER — Other Ambulatory Visit: Payer: Self-pay

## 2019-06-28 DIAGNOSIS — C4921 Malignant neoplasm of connective and soft tissue of right lower limb, including hip: Secondary | ICD-10-CM

## 2019-06-28 DIAGNOSIS — Z510 Encounter for antineoplastic radiation therapy: Secondary | ICD-10-CM | POA: Diagnosis not present

## 2019-06-28 MED ORDER — SONAFINE EX EMUL
1.0000 "application " | Freq: Two times a day (BID) | CUTANEOUS | Status: DC
  Administered 2019-06-28: 1 via TOPICAL

## 2019-06-29 ENCOUNTER — Other Ambulatory Visit: Payer: Self-pay

## 2019-06-29 ENCOUNTER — Ambulatory Visit
Admission: RE | Admit: 2019-06-29 | Discharge: 2019-06-29 | Disposition: A | Discharge status: Home / Self Care | Payer: Medicare Other | Source: Ambulatory Visit | Attending: Radiation Oncology | Admitting: Radiation Oncology

## 2019-06-29 DIAGNOSIS — C4921 Malignant neoplasm of connective and soft tissue of right lower limb, including hip: Secondary | ICD-10-CM | POA: Diagnosis not present

## 2019-06-29 DIAGNOSIS — Z510 Encounter for antineoplastic radiation therapy: Secondary | ICD-10-CM | POA: Diagnosis not present

## 2019-06-30 ENCOUNTER — Other Ambulatory Visit: Payer: Self-pay

## 2019-06-30 ENCOUNTER — Ambulatory Visit
Admission: RE | Admit: 2019-06-30 | Discharge: 2019-06-30 | Disposition: A | Discharge status: Home / Self Care | Payer: Medicare Other | Source: Ambulatory Visit | Attending: Radiation Oncology | Admitting: Radiation Oncology

## 2019-06-30 DIAGNOSIS — Z510 Encounter for antineoplastic radiation therapy: Secondary | ICD-10-CM | POA: Diagnosis not present

## 2019-06-30 DIAGNOSIS — C4921 Malignant neoplasm of connective and soft tissue of right lower limb, including hip: Secondary | ICD-10-CM | POA: Diagnosis not present

## 2019-07-01 ENCOUNTER — Other Ambulatory Visit: Payer: Self-pay

## 2019-07-01 ENCOUNTER — Ambulatory Visit
Admission: RE | Admit: 2019-07-01 | Discharge: 2019-07-01 | Disposition: A | Discharge status: Home / Self Care | Payer: Medicare Other | Source: Ambulatory Visit | Attending: Radiation Oncology | Admitting: Radiation Oncology

## 2019-07-01 DIAGNOSIS — Z510 Encounter for antineoplastic radiation therapy: Secondary | ICD-10-CM | POA: Diagnosis not present

## 2019-07-01 DIAGNOSIS — C4921 Malignant neoplasm of connective and soft tissue of right lower limb, including hip: Secondary | ICD-10-CM | POA: Diagnosis not present

## 2019-07-04 ENCOUNTER — Other Ambulatory Visit: Payer: Self-pay

## 2019-07-04 ENCOUNTER — Ambulatory Visit
Admission: RE | Admit: 2019-07-04 | Discharge: 2019-07-04 | Disposition: A | Discharge status: Home / Self Care | Payer: Medicare Other | Source: Ambulatory Visit | Attending: Radiation Oncology | Admitting: Radiation Oncology

## 2019-07-04 DIAGNOSIS — Z510 Encounter for antineoplastic radiation therapy: Secondary | ICD-10-CM | POA: Diagnosis not present

## 2019-07-04 DIAGNOSIS — C4921 Malignant neoplasm of connective and soft tissue of right lower limb, including hip: Secondary | ICD-10-CM | POA: Diagnosis not present

## 2019-07-05 ENCOUNTER — Other Ambulatory Visit: Payer: Self-pay

## 2019-07-05 ENCOUNTER — Ambulatory Visit
Admission: RE | Admit: 2019-07-05 | Discharge: 2019-07-05 | Disposition: A | Discharge status: Home / Self Care | Payer: Medicare Other | Source: Ambulatory Visit | Attending: Radiation Oncology | Admitting: Radiation Oncology

## 2019-07-05 DIAGNOSIS — Z510 Encounter for antineoplastic radiation therapy: Secondary | ICD-10-CM | POA: Diagnosis not present

## 2019-07-05 DIAGNOSIS — C4921 Malignant neoplasm of connective and soft tissue of right lower limb, including hip: Secondary | ICD-10-CM | POA: Diagnosis not present

## 2019-07-05 MED ORDER — RADIAPLEXRX EX GEL
Freq: Once | CUTANEOUS | Status: AC
  Administered 2019-07-05: 12:00:00 via TOPICAL

## 2019-07-05 MED ORDER — SONAFINE EX EMUL
1.0000 "application " | Freq: Two times a day (BID) | CUTANEOUS | Status: DC
  Administered 2019-07-05: 1 via TOPICAL

## 2019-07-06 ENCOUNTER — Other Ambulatory Visit: Payer: Self-pay

## 2019-07-06 ENCOUNTER — Ambulatory Visit
Admission: RE | Admit: 2019-07-06 | Discharge: 2019-07-06 | Disposition: A | Discharge status: Home / Self Care | Payer: Medicare Other | Source: Ambulatory Visit | Attending: Radiation Oncology | Admitting: Radiation Oncology

## 2019-07-06 DIAGNOSIS — Z510 Encounter for antineoplastic radiation therapy: Secondary | ICD-10-CM | POA: Diagnosis not present

## 2019-07-06 DIAGNOSIS — C4921 Malignant neoplasm of connective and soft tissue of right lower limb, including hip: Secondary | ICD-10-CM | POA: Diagnosis not present

## 2019-07-07 ENCOUNTER — Ambulatory Visit
Admission: RE | Admit: 2019-07-07 | Discharge: 2019-07-07 | Disposition: A | Discharge status: Home / Self Care | Payer: Medicare Other | Source: Ambulatory Visit | Attending: Radiation Oncology | Admitting: Radiation Oncology

## 2019-07-07 ENCOUNTER — Other Ambulatory Visit: Payer: Self-pay

## 2019-07-07 DIAGNOSIS — Z510 Encounter for antineoplastic radiation therapy: Secondary | ICD-10-CM | POA: Diagnosis not present

## 2019-07-07 DIAGNOSIS — C4921 Malignant neoplasm of connective and soft tissue of right lower limb, including hip: Secondary | ICD-10-CM | POA: Diagnosis not present

## 2019-07-08 ENCOUNTER — Ambulatory Visit
Admission: RE | Admit: 2019-07-08 | Discharge: 2019-07-08 | Disposition: A | Discharge status: Home / Self Care | Payer: Medicare Other | Source: Ambulatory Visit | Attending: Radiation Oncology | Admitting: Radiation Oncology

## 2019-07-08 ENCOUNTER — Other Ambulatory Visit: Payer: Self-pay

## 2019-07-08 DIAGNOSIS — C4921 Malignant neoplasm of connective and soft tissue of right lower limb, including hip: Secondary | ICD-10-CM | POA: Diagnosis not present

## 2019-07-08 DIAGNOSIS — Z510 Encounter for antineoplastic radiation therapy: Secondary | ICD-10-CM | POA: Diagnosis not present

## 2019-07-08 DIAGNOSIS — M19041 Primary osteoarthritis, right hand: Secondary | ICD-10-CM | POA: Diagnosis not present

## 2019-07-08 DIAGNOSIS — M79604 Pain in right leg: Secondary | ICD-10-CM | POA: Diagnosis not present

## 2019-07-08 DIAGNOSIS — G894 Chronic pain syndrome: Secondary | ICD-10-CM | POA: Diagnosis not present

## 2019-07-08 DIAGNOSIS — Z79891 Long term (current) use of opiate analgesic: Secondary | ICD-10-CM | POA: Diagnosis not present

## 2019-07-11 ENCOUNTER — Ambulatory Visit
Admission: RE | Admit: 2019-07-11 | Discharge: 2019-07-11 | Disposition: A | Discharge status: Home / Self Care | Payer: Medicare Other | Source: Ambulatory Visit | Attending: Radiation Oncology | Admitting: Radiation Oncology

## 2019-07-11 ENCOUNTER — Other Ambulatory Visit: Payer: Self-pay

## 2019-07-11 DIAGNOSIS — C4921 Malignant neoplasm of connective and soft tissue of right lower limb, including hip: Secondary | ICD-10-CM | POA: Diagnosis not present

## 2019-07-11 DIAGNOSIS — Z510 Encounter for antineoplastic radiation therapy: Secondary | ICD-10-CM | POA: Diagnosis not present

## 2019-07-12 ENCOUNTER — Ambulatory Visit
Admission: RE | Admit: 2019-07-12 | Discharge: 2019-07-12 | Disposition: A | Discharge status: Home / Self Care | Payer: Medicare Other | Source: Ambulatory Visit | Attending: Radiation Oncology | Admitting: Radiation Oncology

## 2019-07-12 ENCOUNTER — Other Ambulatory Visit: Payer: Self-pay

## 2019-07-12 DIAGNOSIS — C4921 Malignant neoplasm of connective and soft tissue of right lower limb, including hip: Secondary | ICD-10-CM | POA: Diagnosis not present

## 2019-07-12 DIAGNOSIS — Z510 Encounter for antineoplastic radiation therapy: Secondary | ICD-10-CM | POA: Diagnosis not present

## 2019-07-13 ENCOUNTER — Other Ambulatory Visit: Payer: Self-pay

## 2019-07-13 ENCOUNTER — Ambulatory Visit
Admission: RE | Admit: 2019-07-13 | Discharge: 2019-07-13 | Disposition: A | Discharge status: Home / Self Care | Payer: Medicare Other | Source: Ambulatory Visit | Attending: Radiation Oncology | Admitting: Radiation Oncology

## 2019-07-13 DIAGNOSIS — Z510 Encounter for antineoplastic radiation therapy: Secondary | ICD-10-CM | POA: Diagnosis not present

## 2019-07-13 DIAGNOSIS — C4921 Malignant neoplasm of connective and soft tissue of right lower limb, including hip: Secondary | ICD-10-CM | POA: Diagnosis not present

## 2019-07-14 ENCOUNTER — Ambulatory Visit
Admission: RE | Admit: 2019-07-14 | Discharge: 2019-07-14 | Disposition: A | Discharge status: Home / Self Care | Payer: Medicare Other | Source: Ambulatory Visit | Attending: Radiation Oncology | Admitting: Radiation Oncology

## 2019-07-14 ENCOUNTER — Other Ambulatory Visit: Payer: Self-pay

## 2019-07-14 DIAGNOSIS — Z510 Encounter for antineoplastic radiation therapy: Secondary | ICD-10-CM | POA: Diagnosis not present

## 2019-07-14 DIAGNOSIS — C4921 Malignant neoplasm of connective and soft tissue of right lower limb, including hip: Secondary | ICD-10-CM | POA: Diagnosis not present

## 2019-07-14 DIAGNOSIS — R350 Frequency of micturition: Secondary | ICD-10-CM | POA: Diagnosis not present

## 2019-07-15 ENCOUNTER — Ambulatory Visit
Admission: RE | Admit: 2019-07-15 | Discharge: 2019-07-15 | Disposition: A | Discharge status: Home / Self Care | Payer: Medicare Other | Source: Ambulatory Visit | Attending: Radiation Oncology | Admitting: Radiation Oncology

## 2019-07-15 ENCOUNTER — Other Ambulatory Visit: Payer: Self-pay

## 2019-07-15 DIAGNOSIS — C4921 Malignant neoplasm of connective and soft tissue of right lower limb, including hip: Secondary | ICD-10-CM | POA: Diagnosis not present

## 2019-07-15 DIAGNOSIS — Z510 Encounter for antineoplastic radiation therapy: Secondary | ICD-10-CM | POA: Diagnosis not present

## 2019-07-18 ENCOUNTER — Other Ambulatory Visit: Payer: Self-pay

## 2019-07-18 ENCOUNTER — Ambulatory Visit
Admission: RE | Admit: 2019-07-18 | Discharge: 2019-07-18 | Disposition: A | Discharge status: Home / Self Care | Payer: Medicare Other | Source: Ambulatory Visit | Attending: Radiation Oncology | Admitting: Radiation Oncology

## 2019-07-18 DIAGNOSIS — C4921 Malignant neoplasm of connective and soft tissue of right lower limb, including hip: Secondary | ICD-10-CM | POA: Diagnosis not present

## 2019-07-18 DIAGNOSIS — Z510 Encounter for antineoplastic radiation therapy: Secondary | ICD-10-CM | POA: Diagnosis not present

## 2019-07-19 ENCOUNTER — Ambulatory Visit
Admission: RE | Admit: 2019-07-19 | Discharge: 2019-07-19 | Disposition: A | Discharge status: Home / Self Care | Payer: Medicare Other | Source: Ambulatory Visit | Attending: Radiation Oncology | Admitting: Radiation Oncology

## 2019-07-19 ENCOUNTER — Other Ambulatory Visit: Payer: Self-pay

## 2019-07-19 DIAGNOSIS — C4921 Malignant neoplasm of connective and soft tissue of right lower limb, including hip: Secondary | ICD-10-CM | POA: Diagnosis not present

## 2019-07-19 DIAGNOSIS — Z510 Encounter for antineoplastic radiation therapy: Secondary | ICD-10-CM | POA: Diagnosis not present

## 2019-07-20 ENCOUNTER — Ambulatory Visit
Admission: RE | Admit: 2019-07-20 | Discharge: 2019-07-20 | Disposition: A | Discharge status: Home / Self Care | Payer: Medicare Other | Source: Ambulatory Visit | Attending: Radiation Oncology | Admitting: Radiation Oncology

## 2019-07-20 ENCOUNTER — Ambulatory Visit: Payer: Medicare Other

## 2019-07-20 ENCOUNTER — Other Ambulatory Visit: Payer: Self-pay

## 2019-07-20 DIAGNOSIS — Z510 Encounter for antineoplastic radiation therapy: Secondary | ICD-10-CM | POA: Diagnosis not present

## 2019-07-20 DIAGNOSIS — C4921 Malignant neoplasm of connective and soft tissue of right lower limb, including hip: Secondary | ICD-10-CM

## 2019-07-20 NOTE — Progress Notes (Signed)
Pt contacted and VM left that Dr. Sondra Come would like to get lab today after radiation tx. Lab seat for 1145. This RN's direct number left for any questions/concerns. Loma Sousa, RN BSN

## 2019-07-21 ENCOUNTER — Other Ambulatory Visit: Payer: Self-pay

## 2019-07-21 ENCOUNTER — Ambulatory Visit
Admission: RE | Admit: 2019-07-21 | Discharge: 2019-07-21 | Disposition: A | Discharge status: Home / Self Care | Payer: Medicare Other | Source: Ambulatory Visit | Attending: Radiation Oncology | Admitting: Radiation Oncology

## 2019-07-21 DIAGNOSIS — C4921 Malignant neoplasm of connective and soft tissue of right lower limb, including hip: Secondary | ICD-10-CM | POA: Diagnosis not present

## 2019-07-21 DIAGNOSIS — Z510 Encounter for antineoplastic radiation therapy: Secondary | ICD-10-CM | POA: Diagnosis not present

## 2019-07-21 LAB — CBC WITH DIFFERENTIAL (CANCER CENTER ONLY)
Abs Immature Granulocytes: 0.04 10*3/uL (ref 0.00–0.07)
Basophils Absolute: 0.1 10*3/uL (ref 0.0–0.1)
Basophils Relative: 1 %
Eosinophils Absolute: 0.2 10*3/uL (ref 0.0–0.5)
Eosinophils Relative: 2 %
HCT: 36.2 % (ref 36.0–46.0)
Hemoglobin: 12 g/dL (ref 12.0–15.0)
Immature Granulocytes: 1 %
Lymphocytes Relative: 16 %
Lymphs Abs: 1.2 10*3/uL (ref 0.7–4.0)
MCH: 33 pg (ref 26.0–34.0)
MCHC: 33.1 g/dL (ref 30.0–36.0)
MCV: 99.5 fL (ref 80.0–100.0)
Monocytes Absolute: 0.7 10*3/uL (ref 0.1–1.0)
Monocytes Relative: 10 %
Neutro Abs: 5.4 10*3/uL (ref 1.7–7.7)
Neutrophils Relative %: 70 %
Platelet Count: 313 10*3/uL (ref 150–400)
RBC: 3.64 MIL/uL — ABNORMAL LOW (ref 3.87–5.11)
RDW: 13.2 % (ref 11.5–15.5)
WBC Count: 7.6 10*3/uL (ref 4.0–10.5)
nRBC: 0 % (ref 0.0–0.2)

## 2019-07-22 ENCOUNTER — Ambulatory Visit
Admission: RE | Admit: 2019-07-22 | Discharge: 2019-07-22 | Disposition: A | Discharge status: Home / Self Care | Payer: Medicare Other | Source: Ambulatory Visit | Attending: Radiation Oncology | Admitting: Radiation Oncology

## 2019-07-22 ENCOUNTER — Other Ambulatory Visit: Payer: Self-pay

## 2019-07-22 ENCOUNTER — Telehealth: Payer: Self-pay

## 2019-07-22 DIAGNOSIS — Z510 Encounter for antineoplastic radiation therapy: Secondary | ICD-10-CM | POA: Diagnosis not present

## 2019-07-22 DIAGNOSIS — C4921 Malignant neoplasm of connective and soft tissue of right lower limb, including hip: Secondary | ICD-10-CM | POA: Diagnosis not present

## 2019-07-22 NOTE — Telephone Encounter (Signed)
Are you accepting as new patient? Or ask Dr. Jerilee Hoh?  Copied from [place] [phone]. Topic: General - Other >> Jul 21, 2019  4:43 PM Pauline Good wrote: Reason for CRM: pt was told to call by Dr Dwana Curd to call and get established has a NP with Dr Elease Hashimoto. Please call pt to advise. >> Jul 22, 2019  8:16 AM Cox, Melburn Hake, CMA wrote: Do you know anything about this?

## 2019-07-24 NOTE — Telephone Encounter (Signed)
I don't know anything about this.  I would verify if she is looking to establish with new primary.

## 2019-07-25 ENCOUNTER — Other Ambulatory Visit: Payer: Self-pay

## 2019-07-25 ENCOUNTER — Ambulatory Visit
Admission: RE | Admit: 2019-07-25 | Discharge: 2019-07-25 | Disposition: A | Discharge status: Home / Self Care | Payer: Medicare Other | Source: Ambulatory Visit | Attending: Radiation Oncology | Admitting: Radiation Oncology

## 2019-07-25 DIAGNOSIS — Z510 Encounter for antineoplastic radiation therapy: Secondary | ICD-10-CM | POA: Diagnosis not present

## 2019-07-25 DIAGNOSIS — C4921 Malignant neoplasm of connective and soft tissue of right lower limb, including hip: Secondary | ICD-10-CM | POA: Diagnosis not present

## 2019-07-26 ENCOUNTER — Other Ambulatory Visit: Payer: Self-pay

## 2019-07-26 ENCOUNTER — Ambulatory Visit
Admission: RE | Admit: 2019-07-26 | Discharge: 2019-07-26 | Disposition: A | Discharge status: Home / Self Care | Payer: Medicare Other | Source: Ambulatory Visit | Attending: Radiation Oncology | Admitting: Radiation Oncology

## 2019-07-26 DIAGNOSIS — Z510 Encounter for antineoplastic radiation therapy: Secondary | ICD-10-CM | POA: Insufficient documentation

## 2019-07-26 DIAGNOSIS — C4921 Malignant neoplasm of connective and soft tissue of right lower limb, including hip: Secondary | ICD-10-CM | POA: Diagnosis not present

## 2019-07-26 NOTE — Telephone Encounter (Signed)
Patient returning call to inquire if Dr.Burchette would be willing to accept her as new patient. Patient states that her current PCP is Dr. Shelia Media but feels like he is "too busy" to assist her with her needs and does not take the time she feels she needs at appointments. Patient is aware that Dr. Elease Hashimoto is not accepting new patients at this time. Patient states that she would like to see Dr. Elease Hashimoto as he came highly recommended to her but would be willing to see Dr. Volanda Napoleon otherwise.

## 2019-07-26 NOTE — Telephone Encounter (Signed)
Called patient and LMOVM to return call  [place] for [facility] to Discuss results / PCP / recommendations / Schedule patient  I called patient and left a detailed voice message for patient to let us know if she needs a new PCP as it shows her current PCP is Dr Deland Pretty and we are not sure why she is requesting a primary care doctor? Hopefully she will leave a detailed message so we can send to see who will approve her request if she is looking for a PCP.  CRM Created.

## 2019-07-26 NOTE — Telephone Encounter (Signed)
Please see message. °

## 2019-07-27 ENCOUNTER — Ambulatory Visit
Admission: RE | Admit: 2019-07-27 | Discharge: 2019-07-27 | Disposition: A | Discharge status: Home / Self Care | Payer: Medicare Other | Source: Ambulatory Visit | Attending: Radiation Oncology | Admitting: Radiation Oncology

## 2019-07-27 ENCOUNTER — Ambulatory Visit: Payer: Medicare Other

## 2019-07-27 ENCOUNTER — Other Ambulatory Visit: Payer: Self-pay

## 2019-07-27 DIAGNOSIS — C4921 Malignant neoplasm of connective and soft tissue of right lower limb, including hip: Secondary | ICD-10-CM | POA: Diagnosis not present

## 2019-07-27 DIAGNOSIS — Z510 Encounter for antineoplastic radiation therapy: Secondary | ICD-10-CM | POA: Diagnosis not present

## 2019-07-27 NOTE — Telephone Encounter (Signed)
OK to see. 

## 2019-07-27 NOTE — Telephone Encounter (Signed)
Can someone change PCP and set up visit with Dr. Elease Hashimoto? Thank you!

## 2019-07-28 ENCOUNTER — Ambulatory Visit
Admission: RE | Admit: 2019-07-28 | Discharge: 2019-07-28 | Disposition: A | Discharge status: Home / Self Care | Payer: Medicare Other | Source: Ambulatory Visit | Attending: Radiation Oncology | Admitting: Radiation Oncology

## 2019-07-28 ENCOUNTER — Other Ambulatory Visit: Payer: Self-pay

## 2019-07-28 ENCOUNTER — Ambulatory Visit: Payer: Medicare Other

## 2019-07-28 DIAGNOSIS — Z510 Encounter for antineoplastic radiation therapy: Secondary | ICD-10-CM | POA: Diagnosis not present

## 2019-07-28 DIAGNOSIS — C4921 Malignant neoplasm of connective and soft tissue of right lower limb, including hip: Secondary | ICD-10-CM | POA: Diagnosis not present

## 2019-07-29 ENCOUNTER — Ambulatory Visit: Payer: Medicare Other

## 2019-07-29 ENCOUNTER — Other Ambulatory Visit: Payer: Self-pay

## 2019-07-29 ENCOUNTER — Ambulatory Visit
Admission: RE | Admit: 2019-07-29 | Discharge: 2019-07-29 | Disposition: A | Discharge status: Home / Self Care | Payer: Medicare Other | Source: Ambulatory Visit | Attending: Radiation Oncology | Admitting: Radiation Oncology

## 2019-07-29 DIAGNOSIS — Z510 Encounter for antineoplastic radiation therapy: Secondary | ICD-10-CM | POA: Diagnosis not present

## 2019-07-29 DIAGNOSIS — C4921 Malignant neoplasm of connective and soft tissue of right lower limb, including hip: Secondary | ICD-10-CM | POA: Diagnosis not present

## 2019-08-02 ENCOUNTER — Ambulatory Visit: Payer: Medicare Other

## 2019-08-02 DIAGNOSIS — R600 Localized edema: Secondary | ICD-10-CM | POA: Diagnosis not present

## 2019-08-02 DIAGNOSIS — Z483 Aftercare following surgery for neoplasm: Secondary | ICD-10-CM | POA: Diagnosis not present

## 2019-08-02 DIAGNOSIS — C4921 Malignant neoplasm of connective and soft tissue of right lower limb, including hip: Secondary | ICD-10-CM | POA: Diagnosis not present

## 2019-08-02 DIAGNOSIS — Z96651 Presence of right artificial knee joint: Secondary | ICD-10-CM | POA: Diagnosis not present

## 2019-08-03 ENCOUNTER — Ambulatory Visit: Payer: Medicare Other

## 2019-08-03 ENCOUNTER — Other Ambulatory Visit: Payer: Self-pay

## 2019-08-03 ENCOUNTER — Ambulatory Visit
Admission: RE | Admit: 2019-08-03 | Discharge: 2019-08-03 | Disposition: A | Discharge status: Home / Self Care | Payer: Medicare Other | Source: Ambulatory Visit | Attending: Radiation Oncology | Admitting: Radiation Oncology

## 2019-08-03 DIAGNOSIS — Z510 Encounter for antineoplastic radiation therapy: Secondary | ICD-10-CM | POA: Diagnosis not present

## 2019-08-03 DIAGNOSIS — C4921 Malignant neoplasm of connective and soft tissue of right lower limb, including hip: Secondary | ICD-10-CM | POA: Diagnosis not present

## 2019-08-04 ENCOUNTER — Other Ambulatory Visit: Payer: Self-pay

## 2019-08-04 ENCOUNTER — Ambulatory Visit
Admission: RE | Admit: 2019-08-04 | Discharge: 2019-08-04 | Disposition: A | Discharge status: Home / Self Care | Payer: Medicare Other | Source: Ambulatory Visit | Attending: Radiation Oncology | Admitting: Radiation Oncology

## 2019-08-04 DIAGNOSIS — C4921 Malignant neoplasm of connective and soft tissue of right lower limb, including hip: Secondary | ICD-10-CM | POA: Diagnosis not present

## 2019-08-04 DIAGNOSIS — Z510 Encounter for antineoplastic radiation therapy: Secondary | ICD-10-CM | POA: Diagnosis not present

## 2019-08-05 ENCOUNTER — Other Ambulatory Visit: Payer: Self-pay

## 2019-08-05 ENCOUNTER — Ambulatory Visit
Admission: RE | Admit: 2019-08-05 | Discharge: 2019-08-05 | Disposition: A | Discharge status: Home / Self Care | Payer: Medicare Other | Source: Ambulatory Visit | Attending: Radiation Oncology | Admitting: Radiation Oncology

## 2019-08-05 DIAGNOSIS — C4921 Malignant neoplasm of connective and soft tissue of right lower limb, including hip: Secondary | ICD-10-CM | POA: Diagnosis not present

## 2019-08-05 DIAGNOSIS — Z510 Encounter for antineoplastic radiation therapy: Secondary | ICD-10-CM | POA: Diagnosis not present

## 2019-08-05 NOTE — Telephone Encounter (Signed)
LMVM to contact the office to establish care with Dr. Elease Hashimoto.

## 2019-08-08 ENCOUNTER — Ambulatory Visit
Admission: RE | Admit: 2019-08-08 | Discharge: 2019-08-08 | Disposition: A | Discharge status: Home / Self Care | Payer: Medicare Other | Source: Ambulatory Visit | Attending: Radiation Oncology | Admitting: Radiation Oncology

## 2019-08-08 ENCOUNTER — Ambulatory Visit: Payer: Medicare Other

## 2019-08-08 ENCOUNTER — Other Ambulatory Visit: Payer: Self-pay

## 2019-08-08 DIAGNOSIS — Z510 Encounter for antineoplastic radiation therapy: Secondary | ICD-10-CM | POA: Diagnosis not present

## 2019-08-08 DIAGNOSIS — C4921 Malignant neoplasm of connective and soft tissue of right lower limb, including hip: Secondary | ICD-10-CM | POA: Diagnosis not present

## 2019-08-09 ENCOUNTER — Ambulatory Visit
Admission: RE | Admit: 2019-08-09 | Discharge: 2019-08-09 | Disposition: A | Discharge status: Home / Self Care | Payer: Medicare Other | Source: Ambulatory Visit | Attending: Radiation Oncology | Admitting: Radiation Oncology

## 2019-08-09 ENCOUNTER — Ambulatory Visit: Payer: Medicare Other

## 2019-08-09 ENCOUNTER — Other Ambulatory Visit: Payer: Self-pay

## 2019-08-09 DIAGNOSIS — C4921 Malignant neoplasm of connective and soft tissue of right lower limb, including hip: Secondary | ICD-10-CM | POA: Diagnosis not present

## 2019-08-09 DIAGNOSIS — Z510 Encounter for antineoplastic radiation therapy: Secondary | ICD-10-CM | POA: Diagnosis not present

## 2019-08-09 MED ORDER — RADIAPLEXRX EX GEL
Freq: Once | CUTANEOUS | Status: AC
  Administered 2019-08-09: 16:00:00 via TOPICAL

## 2019-08-10 ENCOUNTER — Other Ambulatory Visit: Payer: Self-pay

## 2019-08-10 ENCOUNTER — Ambulatory Visit: Payer: Medicare Other

## 2019-08-10 ENCOUNTER — Ambulatory Visit
Admission: RE | Admit: 2019-08-10 | Discharge: 2019-08-10 | Disposition: A | Discharge status: Home / Self Care | Payer: Medicare Other | Source: Ambulatory Visit | Attending: Radiation Oncology | Admitting: Radiation Oncology

## 2019-08-10 DIAGNOSIS — C4921 Malignant neoplasm of connective and soft tissue of right lower limb, including hip: Secondary | ICD-10-CM | POA: Diagnosis not present

## 2019-08-10 DIAGNOSIS — Z510 Encounter for antineoplastic radiation therapy: Secondary | ICD-10-CM | POA: Diagnosis not present

## 2019-08-11 ENCOUNTER — Other Ambulatory Visit: Payer: Self-pay

## 2019-08-11 ENCOUNTER — Ambulatory Visit
Admission: RE | Admit: 2019-08-11 | Discharge: 2019-08-11 | Disposition: A | Discharge status: Home / Self Care | Payer: Medicare Other | Source: Ambulatory Visit | Attending: Radiation Oncology | Admitting: Radiation Oncology

## 2019-08-11 DIAGNOSIS — C4921 Malignant neoplasm of connective and soft tissue of right lower limb, including hip: Secondary | ICD-10-CM | POA: Diagnosis not present

## 2019-08-11 DIAGNOSIS — Z510 Encounter for antineoplastic radiation therapy: Secondary | ICD-10-CM | POA: Diagnosis not present

## 2019-08-12 ENCOUNTER — Ambulatory Visit
Admission: RE | Admit: 2019-08-12 | Discharge: 2019-08-12 | Disposition: A | Discharge status: Home / Self Care | Payer: Medicare Other | Source: Ambulatory Visit | Attending: Radiation Oncology | Admitting: Radiation Oncology

## 2019-08-12 ENCOUNTER — Encounter: Payer: Self-pay | Admitting: Radiation Oncology

## 2019-08-12 ENCOUNTER — Other Ambulatory Visit: Payer: Self-pay

## 2019-08-12 DIAGNOSIS — C4921 Malignant neoplasm of connective and soft tissue of right lower limb, including hip: Secondary | ICD-10-CM | POA: Diagnosis not present

## 2019-08-12 DIAGNOSIS — Z510 Encounter for antineoplastic radiation therapy: Secondary | ICD-10-CM | POA: Diagnosis not present

## 2019-09-12 NOTE — Progress Notes (Signed)
  Patient Name: Destiny [name] MRN: [id number] DOB: 10/18/1941 Referring Physician: Deland Pretty (Profile Not Attached) Date of Service: 08/12/2019 [facility], [employer]                                                        End Of Treatment Note  Diagnoses: C49.21-Malignant neoplasm of connective and soft tissue of right lower limb, including hip  Cancer Staging: Stage IIIB (pT3, cN0, m0), grade 3  Intent: Curative  Radiation Treatment Dates: 06/27/2019 through 08/12/2019 Site Technique Total Dose Dose per Fx Completed Fx Beam Energies  Extremity: Ext_Rt 3D 50/50 2 25/25 6X, 10X  Extremity: Ext_Rt_Bst 3D 16/16 2 8/8 6X   Narrative: The patient tolerated radiation therapy relatively well. She experienced improvement in her right leg swelling towards the end of treatment. She reported moderate fatigue. She was noted to have some skin hyperpigmentation changes without skin breakdown.  Plan: The patient will follow-up with radiation oncology in 1 month.  ________________________________________________   Blair Promise, PhD, MD  This document serves as a record of services personally performed by Gery Pray, MD. It was created on his behalf by Wilburn Mylar, a trained medical scribe. The creation of this record is based on the scribe's personal observations and the provider's statements to them. This document has been checked and approved by the attending provider.

## 2019-09-23 DIAGNOSIS — M19041 Primary osteoarthritis, right hand: Secondary | ICD-10-CM | POA: Diagnosis not present

## 2019-09-23 DIAGNOSIS — M79604 Pain in right leg: Secondary | ICD-10-CM | POA: Diagnosis not present

## 2019-09-23 DIAGNOSIS — Z79891 Long term (current) use of opiate analgesic: Secondary | ICD-10-CM | POA: Diagnosis not present

## 2019-09-23 DIAGNOSIS — G894 Chronic pain syndrome: Secondary | ICD-10-CM | POA: Diagnosis not present

## 2019-09-26 ENCOUNTER — Other Ambulatory Visit: Payer: Self-pay

## 2019-09-26 ENCOUNTER — Encounter: Payer: Self-pay | Admitting: Radiation Oncology

## 2019-09-26 ENCOUNTER — Ambulatory Visit: Payer: Medicare Other | Admitting: Family Medicine

## 2019-09-26 ENCOUNTER — Ambulatory Visit
Admission: RE | Admit: 2019-09-26 | Discharge: 2019-09-26 | Disposition: A | Discharge status: Home / Self Care | Payer: Medicare Other | Source: Ambulatory Visit | Attending: Radiation Oncology | Admitting: Radiation Oncology

## 2019-09-26 VITALS — BP 135/67 | HR 64 | Temp 97.8°F | Resp 16 | Wt 153.6 lb

## 2019-09-26 DIAGNOSIS — Z79899 Other long term (current) drug therapy: Secondary | ICD-10-CM | POA: Insufficient documentation

## 2019-09-26 DIAGNOSIS — C4921 Malignant neoplasm of connective and soft tissue of right lower limb, including hip: Secondary | ICD-10-CM

## 2019-09-26 DIAGNOSIS — Z923 Personal history of irradiation: Secondary | ICD-10-CM | POA: Insufficient documentation

## 2019-09-26 NOTE — Progress Notes (Signed)
Radiation Oncology         [phone] ________________________________  Name: Destiny [name] MRN: [id number]  Date: 09/26/2019  DOB: [birth year]  Follow-Up Visit Note  CC: Eulas Post, MD  Deland Pretty, MD    ICD-10-CM   1. Liposarcoma of right thigh ([facility])  C49.21     Diagnosis: Stage IIIB (pT3, cN0) Liposarcoma of the Right Thigh, Grade 3   Interval Since Last Radiation:  1 month and 2 weeks  06/27/2019 through 08/12/2019 Site Technique Total Dose Dose per Fx Completed Fx Beam Energies  Extremity: Ext_Rt 3D 50/50 2 25/25 6X, 10X  Extremity: Ext_Rt_Bst 3D 16/16 2 8/8 6X    Narrative:  The patient returns today for routine follow-up.  She overall is doing well.  She continues to have some edema in her distal right lower extremity but no pain in this area.  On review of systems, she her skin is healed well.  She continues to have some discomfort along the right knee related to prior replacement.  She is considering an steroid injection.  ALLERGIES:  is allergic to nsaids; oxycodone-acetaminophen; aspirin; augmentin [amoxicillin-pot clavulanate]; ciprocin-fluocin-procin [fluocinolone]; ciprofloxacin hcl; fentanyl; fluclorolone; lyrica [pregabalin]; oxycodone; versed [midazolam]; diclofenac sodium; hydromorphone hcl; and voltaren gel [diclofenac sodium].  Meds: Current Outpatient Medications  Medication Sig Dispense Refill  . acetaminophen (TYLENOL) 500 MG tablet Take 500 mg by mouth every 4 (four) hours as needed.    Marland Kitchen amLODipine (NORVASC) 5 MG tablet Take 5 mg by mouth daily.     Marland Kitchen b complex vitamins capsule Take 1 capsule by mouth daily.    . Calcium Carb-Cholecalciferol 600-200 MG-UNIT TABS Take 1 tablet by mouth daily.     . cholecalciferol (VITAMIN D) 1000 UNITS tablet Take 2,000 Units by mouth daily.    . ferrous sulfate 325 (65 FE) MG tablet Take 325 mg by mouth daily with breakfast.    . gabapentin (NEURONTIN) 300 MG capsule Take 600-900 mg by mouth See admin  instructions. Take 300 mg every morning and900 mg at bedtime    . HYDROcodone-acetaminophen (NORCO/VICODIN) 5-325 MG tablet Take 1 tablet by mouth every 4 (four) hours as needed for moderate pain or severe pain.     Marland Kitchen ipratropium (ATROVENT) 0.06 % nasal spray Place 1 spray into the nose as needed.    Marland Kitchen levothyroxine (SYNTHROID, LEVOTHROID) 125 MCG tablet Take 125 mcg by mouth daily before breakfast.     . loratadine-pseudoephedrine (CLARITIN-D 12-HOUR) 5-120 MG per tablet Take 1 tablet by mouth daily.    . methocarbamol (ROBAXIN) 750 MG tablet Take 750 mg by mouth 2 (two) times daily as needed for muscle spasms.     . metoprolol succinate (TOPROL-XL) 50 MG 24 hr tablet Take 50 mg by mouth 2 (two) times daily.     Marland Kitchen MYRBETRIQ 50 MG TB24 tablet Take 50 mg by mouth at bedtime.    . nitrofurantoin, macrocrystal-monohydrate, (MACROBID) 100 MG capsule Take 100 mg by mouth daily.     Marland Kitchen olmesartan (BENICAR) 40 MG tablet Take 40 mg by mouth daily.    Marnee Guarneri Isoflavones (SOY BALANCE PO) Take 1 tablet by mouth daily. Soy bean extract    . tolterodine (DETROL LA) 4 MG 24 hr capsule Take 4 mg by mouth daily.    . traZODone (DESYREL) 50 MG tablet Take 50 mg by mouth as needed.    . zolpidem (AMBIEN) 10 MG tablet Take 5 mg by mouth at bedtime as needed for sleep.  1  . aspirin EC 81 MG tablet Take 81 mg by mouth 2 (two) times a day.    . senna-docusate (SENOKOT-S) 8.6-50 MG tablet Take 2 tablets by mouth 2 (two) times a day.     No current facility-administered medications for this encounter.     Physical Findings: The patient is in no acute distress. Patient is alert and oriented.  weight is 153 lb 9.6 oz (69.7 kg). Her temporal temperature is 97.8 F (36.6 C). Her blood pressure is 135/67 and her pulse is 64. Her respiration is 16 and oxygen saturation is 100%. .  No significant changes. Lungs are clear to auscultation bilaterally. Heart has regular rate and rhythm. No palpable cervical,  supraclavicular, or axillary adenopathy. Abdomen soft, non-tender, normal bowel sounds.  Skin along the right medial thigh has healed well.  Mild hyperpigmentation changes.  No dominant masses palpated. Persistent edema in the distal right lower extremity but overall improved since my last exam  Lab Findings: Lab Results  Component Value Date   WBC 7.6 07/21/2019   HGB 12.0 07/21/2019   HCT 36.2 07/21/2019   MCV 99.5 07/21/2019   PLT 313 07/21/2019    Radiographic Findings: No results found.  Impression:  The patient is recovering from the effects of radiation.  Skin is well-healed.  No evidence of recurrence on clinical exam today.  Plan: Patient is scheduled for an MRI of the right thigh as well as chest x-ray at [facility].  She will follow up with her surgeon soon afterward.  Routine follow-up in radiation oncology in February 2021.  Discussed with the patient that we would be glad to set her up for physical therapy concerning her right leg edema but would like to wait until completion of her scans and approval from her surgeon  ____________________________________ Gery Pray, MD   This document serves as a record of services personally performed by Gery Pray, MD. It was created on his behalf by Wilburn Mylar, a trained medical scribe. The creation of this record is based on the scribe's personal observations and the provider's statements to them. This document has been checked and approved by the attending provider.

## 2019-09-26 NOTE — Patient Instructions (Signed)
Coronavirus (COVID-19) Are you at risk?  Are you at risk for the Coronavirus (COVID-19)?  To be considered HIGH RISK for Coronavirus (COVID-19), you have to meet the following criteria:  . Traveled to [place], [place], [place], [place] or [place]; or in the [place] to [place], [place], [place], or [place]; and have fever, cough, and shortness of breath within the last 2 weeks of travel OR . Been in close contact with a person diagnosed with COVID-19 within the last 2 weeks and have fever, cough, and shortness of breath . IF YOU DO NOT MEET THESE CRITERIA, YOU ARE CONSIDERED LOW RISK FOR COVID-19.  What to do if you are HIGH RISK for COVID-19?  . If you are having a medical emergency, call 911. . Seek medical care right away. Before you go to a doctor's office, urgent care or emergency department, call ahead and tell them about your recent travel, contact with someone diagnosed with COVID-19, and your symptoms. You should receive instructions from your physician's office regarding next steps of care.  . When you arrive at healthcare provider, tell the healthcare staff immediately you have returned from visiting [place], [place], [place], [place] or [place]; or traveled in the [place] to [place], [place], [place], or [place]; in the last two weeks or you have been in close contact with a person diagnosed with COVID-19 in the last 2 weeks.   . Tell the health care staff about your symptoms: fever, cough and shortness of breath. . After you have been seen by a medical provider, you will be either: o Tested for (COVID-19) and discharged home on quarantine except to seek medical care if symptoms worsen, and asked to  - Stay home and avoid contact with others until you get your results (4-5 days)  - Avoid travel on public transportation if possible (such as bus, train, or airplane) or o Sent to the Emergency Department by EMS for evaluation, COVID-19 testing, and possible  admission depending on your condition and test results.  What to do if you are LOW RISK for COVID-19?  Reduce your risk of any infection by using the same precautions used for avoiding the common cold or flu:  . Wash your hands often with soap and warm water for at least 20 seconds.  If soap and water are not readily available, use an alcohol-based hand sanitizer with at least 60% alcohol.  . If coughing or sneezing, cover your mouth and nose by coughing or sneezing into the elbow areas of your shirt or coat, into a tissue or into your sleeve (not your hands). . Avoid shaking hands with others and consider head nods or verbal greetings only. . Avoid touching your eyes, nose, or mouth with unwashed hands.  . Avoid close contact with people who are sick. . Avoid places or events with large numbers of people in one location, like concerts or sporting events. . Carefully consider travel plans you have or are making. . If you are planning any travel outside or inside the [place], visit the CDC's Travelers' Health webpage for the latest health notices. . If you have some symptoms but not all symptoms, continue to monitor at home and seek medical attention if your symptoms worsen. . If you are having a medical emergency, call 911.   ADDITIONAL HEALTHCARE OPTIONS FOR PATIENTS  [place] Telehealth / e-Visit: [email or web address]         [facility]: [phone]  [place]   [facility]: [phone]                   [facility]: [phone]   

## 2019-09-26 NOTE — Progress Notes (Signed)
Lower leg is still swollen some. Only hurts if she keeps it down for too long.

## 2019-10-05 ENCOUNTER — Encounter: Payer: Self-pay | Admitting: Family Medicine

## 2019-10-05 ENCOUNTER — Other Ambulatory Visit: Payer: Self-pay

## 2019-10-05 ENCOUNTER — Ambulatory Visit (INDEPENDENT_AMBULATORY_CARE_PROVIDER_SITE_OTHER): Payer: Medicare Other | Admitting: Family Medicine

## 2019-10-05 VITALS — BP 120/70 | HR 75 | Temp 97.2°F | Ht <= 58 in | Wt 157.7 lb

## 2019-10-05 DIAGNOSIS — G250 Essential tremor: Secondary | ICD-10-CM | POA: Diagnosis not present

## 2019-10-05 DIAGNOSIS — C4921 Malignant neoplasm of connective and soft tissue of right lower limb, including hip: Secondary | ICD-10-CM | POA: Diagnosis not present

## 2019-10-05 MED ORDER — METOPROLOL SUCCINATE ER 50 MG PO TB24: 50.0000 mg | ORAL_TABLET | Freq: Two times a day (BID) | ORAL | 3 refills | Status: DC

## 2019-10-05 MED ORDER — AMLODIPINE BESYLATE 5 MG PO TABS: 5.0000 mg | ORAL_TABLET | Freq: Every day | ORAL | 3 refills | Status: DC

## 2019-10-05 MED ORDER — OLMESARTAN MEDOXOMIL 40 MG PO TABS: 40.0000 mg | ORAL_TABLET | Freq: Every day | ORAL | 3 refills | Status: DC

## 2019-10-05 NOTE — Progress Notes (Signed)
Subjective:     Patient ID: Destiny [name], female   DOB: 1941/10/06, 78 y.o.   MRN: [id number]  HPI Destiny [name] is seen to establish care.  She has previously seen Dr. Deland Pretty with [facility].  Chronic problems include hypertension, hypothyroidism, liposarcoma of the right thigh, hyperlipidemia, chronic pain related osteoarthritis  She states she had subtotal thyroidectomy years ago secondary to goiter.  She had recent TSH which was only minimally elevated 4.6 range.  Back in April she was diagnosed with liposarcoma right thigh.  She was having pain above the right knee.  Known osteoarthritis.  She underwent surgery at [facility] and just recently finished radiation therapy.  Overall doing well.  She has chronic pain syndrome followed by pain management.  Hypertension treated with amlodipine, Benicar, and Toprol.  Needs refills of all medications  She has chronic insomnia and takes ambien 5 mg most nights and supplements with trazodone as needed.  She is married and married 17 years.  She is a retired [employer] scientist.  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  She has 2 children and 5 grandchildren.  Never smoked.  Past Medical History:  Diagnosis Date  . Anxiety   . Arthritis   . Colon polyps   . Complication of anesthesia    BP drops with fentyl and versed  . Hepatitis    hep A  1976  . Hypertension   . Hypothyroidism   . Liposarcoma of right thigh ([place]) 03/2019   Past Surgical History:  Procedure Laterality Date  . ABDOMINAL HYSTERECTOMY    . APPENDECTOMY    . BACK SURGERY      fusion  . CESAREAN SECTION     times 2  . EYE SURGERY Left    cataract removed  . FINGER ARTHRODESIS Left 10/02/2017   Procedure: LEFT LONG FINGER ARTHRODESIS;  Surgeon: Milly Jakob, MD;  Location: [facility];  Service: Orthopedics;  Laterality: Left;  . FOOT SURGERY Bilateral   . FRACTURE SURGERY     broken ankle  . MEDIAL PARTIAL KNEE  REPLACEMENT Left    done at [facility] Right 12/28/2014   Procedure: REVERSE SHOULDER ARTHROPLASTY;  Surgeon: Nita Sells, MD;  Location: [facility];  Service: Orthopedics;  Laterality: Right;  Right reverse total shoulder arthroplasty  . REVERSE TOTAL SHOULDER ARTHROPLASTY Right 12/28/2014   DR CHANDLER  . rotator cufff    . THYROIDECTOMY, PARTIAL     one lobe removed  . VARICOSE VEIN SURGERY Right     reports that she has never smoked. She has never used smokeless tobacco. She reports current alcohol use. She reports that she does not use drugs. family history includes Atrial fibrillation in her mother; Cancer - Other in her father; Heart disease in her mother. Allergies  Allergen Reactions  . Nsaids Other (See Comments)     BRUISES EASILY Other Reaction: BRUISES EASILY Other Reaction: BRUISES EASILY   . Oxycodone-Acetaminophen Other (See Comments)     VERY CONSTIPATING, does not provide pain relief  . Aspirin Other (See Comments)    Bruising   . Augmentin [Amoxicillin-Pot Clavulanate] Other (See Comments)    headache  . Ciprocin-Fluocin-Procin [Fluocinolone]   . Ciprofloxacin Hcl     tendonitis  . Fentanyl Other (See Comments)    Does not work  . Fluclorolone Other (See Comments)  . Lyrica [Pregabalin] Other (See Comments)    dysphoria  . Oxycodone Other (  See Comments)    Does not work  . Versed [Midazolam] Other (See Comments)    Does not work  . Diclofenac Sodium Other (See Comments) and Rash    Skin peeling. Voltaren gel-rash and skin peeling   . Hydromorphone Hcl Itching     Itching with large doses - able to tolerate lower doses or shorter duration  . Voltaren Gel [Diclofenac Sodium] Rash and Other (See Comments)    Skin peeling.     Review of Systems  Constitutional: Negative for chills, fatigue, fever and unexpected weight change.  HENT: Negative for trouble swallowing.   Eyes: Negative for visual disturbance.   Respiratory: Negative for cough, chest tightness, shortness of breath and wheezing.   Cardiovascular: Negative for chest pain, palpitations and leg swelling.  Gastrointestinal: Negative for abdominal pain.  Endocrine: Negative for polydipsia and polyuria.  Genitourinary: Negative for dysuria.  Musculoskeletal: Positive for arthralgias.  Skin: Negative for rash.  Neurological: Positive for tremors. Negative for dizziness, seizures, syncope, weakness, light-headedness and headaches.  Psychiatric/Behavioral: Negative for confusion.       Objective:   Physical Exam Constitutional:      Appearance: She is well-developed.  Eyes:     Pupils: Pupils are equal, round, and reactive to light.  Neck:     Musculoskeletal: Neck supple.     Thyroid: No thyromegaly.     Vascular: No JVD.  Cardiovascular:     Rate and Rhythm: Normal rate and regular rhythm.     Heart sounds: No gallop.   Pulmonary:     Effort: Pulmonary effort is normal. No respiratory distress.     Breath sounds: Normal breath sounds. No wheezing or rales.  Musculoskeletal:     Comments: Some mild edema right lower extremity which is chronic following her liposarcoma surgery  Neurological:     Mental Status: She is alert.     Cranial Nerves: No cranial nerve deficit.     Motor: No weakness.     Comments: Mild tremor upper extremities and head/ neck        Assessment:     #1 hypertension-stable.  Needing refill several medications as above  #2 hypothyroidism with past history of subtotal thyroidectomy secondary to goiter with recent TSH minimally elevated  #3 history of liposarcoma right thigh status post surgery and radiation therapy.  She has close follow-up with surgery regarding that  #4 hyperlipidemia  #5 chronic pain related osteoarthritis multiple joints  #6 chronic insomnia  #7 hx of chronic essential tremor.    Plan:     -Vaccines appear to be up-to-date -Refill Benicar, Toprol, and Norvasc for 1  year -We recommend routine follow-up in 6 months.  Consider repeat TSH at that time  Eulas Post MD [facility]

## 2019-11-04 ENCOUNTER — Other Ambulatory Visit: Payer: Self-pay | Admitting: Family Medicine

## 2019-12-26 ENCOUNTER — Encounter: Payer: Self-pay | Admitting: Radiation Oncology

## 2019-12-26 ENCOUNTER — Ambulatory Visit
Admission: RE | Admit: 2019-12-26 | Discharge: 2019-12-26 | Disposition: A | Discharge status: Home / Self Care | Payer: Medicare Other | Source: Ambulatory Visit | Attending: Radiation Oncology | Admitting: Radiation Oncology

## 2019-12-26 ENCOUNTER — Other Ambulatory Visit: Payer: Self-pay

## 2019-12-26 VITALS — BP 116/59 | HR 67 | Temp 97.5°F | Resp 18 | Ht <= 58 in | Wt 159.5 lb

## 2019-12-26 DIAGNOSIS — R918 Other nonspecific abnormal finding of lung field: Secondary | ICD-10-CM | POA: Diagnosis not present

## 2019-12-26 DIAGNOSIS — Z8589 Personal history of malignant neoplasm of other organs and systems: Secondary | ICD-10-CM | POA: Diagnosis present

## 2019-12-26 DIAGNOSIS — C4921 Malignant neoplasm of connective and soft tissue of right lower limb, including hip: Secondary | ICD-10-CM

## 2019-12-26 DIAGNOSIS — Z923 Personal history of irradiation: Secondary | ICD-10-CM | POA: Insufficient documentation

## 2019-12-26 DIAGNOSIS — Z79899 Other long term (current) drug therapy: Secondary | ICD-10-CM | POA: Diagnosis not present

## 2019-12-26 NOTE — Patient Instructions (Signed)
Coronavirus (COVID-19) Are you at risk?  Are you at risk for the Coronavirus (COVID-19)?  To be considered HIGH RISK for Coronavirus (COVID-19), you have to meet the following criteria:  . Traveled to [place], [place], [place], [place] or [place]; or in the [place] to [place], [place], [place], or [place]; and have fever, cough, and shortness of breath within the last 2 weeks of travel OR . Been in close contact with a person diagnosed with COVID-19 within the last 2 weeks and have fever, cough, and shortness of breath . IF YOU DO NOT MEET THESE CRITERIA, YOU ARE CONSIDERED LOW RISK FOR COVID-19.  What to do if you are HIGH RISK for COVID-19?  . If you are having a medical emergency, call 911. . Seek medical care right away. Before you go to a doctor's office, urgent care or emergency department, call ahead and tell them about your recent travel, contact with someone diagnosed with COVID-19, and your symptoms. You should receive instructions from your physician's office regarding next steps of care.  . When you arrive at healthcare provider, tell the healthcare staff immediately you have returned from visiting [place], [place], [place], [place] or [place]; or traveled in the [place] to [place], [place], [place], or [place]; in the last two weeks or you have been in close contact with a person diagnosed with COVID-19 in the last 2 weeks.   . Tell the health care staff about your symptoms: fever, cough and shortness of breath. . After you have been seen by a medical provider, you will be either: o Tested for (COVID-19) and discharged home on quarantine except to seek medical care if symptoms worsen, and asked to  - Stay home and avoid contact with others until you get your results (4-5 days)  - Avoid travel on public transportation if possible (such as bus, train, or airplane) or o Sent to the Emergency Department by EMS for evaluation, COVID-19 testing, and possible  admission depending on your condition and test results.  What to do if you are LOW RISK for COVID-19?  Reduce your risk of any infection by using the same precautions used for avoiding the common cold or flu:  . Wash your hands often with soap and warm water for at least 20 seconds.  If soap and water are not readily available, use an alcohol-based hand sanitizer with at least 60% alcohol.  . If coughing or sneezing, cover your mouth and nose by coughing or sneezing into the elbow areas of your shirt or coat, into a tissue or into your sleeve (not your hands). . Avoid shaking hands with others and consider head nods or verbal greetings only. . Avoid touching your eyes, nose, or mouth with unwashed hands.  . Avoid close contact with people who are sick. . Avoid places or events with large numbers of people in one location, like concerts or sporting events. . Carefully consider travel plans you have or are making. . If you are planning any travel outside or inside the [place], visit the CDC's Travelers' Health webpage for the latest health notices. . If you have some symptoms but not all symptoms, continue to monitor at home and seek medical attention if your symptoms worsen. . If you are having a medical emergency, call 911.   ADDITIONAL HEALTHCARE OPTIONS FOR PATIENTS  [place] Telehealth / e-Visit: [email or web address]         [facility]: [phone]  [facility]: [phone]                   [facility]: [phone]   

## 2019-12-26 NOTE — Progress Notes (Signed)
Pt presents today for f/u with Dr. Sondra Come. Pt recently had MRI of RLE at [facility]. Pt denies c/o pain. Pt reports that most bothersome pain is arthritic. Skin WNL. RLE continues with swelling, not dependent.   BP (!) 116/59 (BP Location: Left Arm, Patient Position: Sitting)   Pulse 67   Temp (!) 97.5 F (36.4 C) (Temporal)   Resp 18   Ht 4' 9.75" (1.467 m)   Wt 159 lb 8 oz (72.3 kg)   SpO2 98%   BMI 33.62 kg/m   Wt Readings from Last 3 Encounters:  12/26/19 159 lb 8 oz (72.3 kg)  10/05/19 157 lb 11.2 oz (71.5 kg)  09/26/19 153 lb 9.6 oz (69.7 kg)   Loma Sousa, RN BSN

## 2019-12-26 NOTE — Progress Notes (Signed)
Radiation Oncology         [phone] ________________________________  Name: Destiny [name] MRN: [id number]  Date: 12/26/2019  DOB: 1941/06/30  Follow-Up Visit Note  CC: Eulas Post, MD  Deland Pretty, MD    ICD-10-CM   1. Liposarcoma of right thigh (HCC)  C49.21     Diagnosis: Stage IIIB (pT3, cN0) Liposarcoma of the Right Thigh, Grade 3   Interval Since Last Radiation: Four months and two weeks.  06/27/2019 through 08/12/2019 Site Technique Total Dose Dose per Fx Completed Fx Beam Energies  Extremity: Ext_Rt 3D 50/50 2 25/25 6X, 10X  Extremity: Ext_Rt_Bst 3D 16/16 2 8/8 6X    Narrative:  The patient returns today for routine follow-up.  She overall is doing well. Since last visit on 09/26/2019, the patient had a chest x-ray done at [facility] on 11/01/2019, which did not show any definite radiographic evidence of metastatic disease to the chest. Scattered calcified granulomas in the bilateral mid and lower lungs are favored to be sequela of prior granulomatous infection.  MRI of right thigh and femur done at [facility] on 11/02/2019 did not show a mass to suggest tumor recurrence. There were some surgical changes of sarcoma resection of the medial lower right thigh with associated subcutaneous and intramuscular edema as well as a fluid collection in the resection bed.  On review of systems, she reports overall is doing well.. She denies pain within the right leg.  She reports continuing edema but this does not bother her.  She does not take any pain medication for this issue.   ALLERGIES:  is allergic to nsaids; oxycodone-acetaminophen; aspirin; augmentin [amoxicillin-pot clavulanate]; ciprocin-fluocin-procin [fluocinolone]; ciprofloxacin hcl; fentanyl; fluclorolone; lyrica [pregabalin]; oxycodone; versed [midazolam]; diclofenac sodium; hydromorphone hcl; and voltaren gel [diclofenac sodium].  Meds: Current Outpatient  Medications  Medication Sig Dispense Refill  . acetaminophen (TYLENOL) 500 MG tablet Take 500 mg by mouth every 4 (four) hours as needed.    Marland Kitchen amLODipine (NORVASC) 5 MG tablet Take 1 tablet (5 mg total) by mouth daily. 90 tablet 3  . b complex vitamins capsule Take 1 capsule by mouth daily.    . Calcium Carb-Cholecalciferol 600-200 MG-UNIT TABS Take 1 tablet by mouth daily.     . cholecalciferol (VITAMIN D) 1000 UNITS tablet Take 2,000 Units by mouth daily.    . ferrous sulfate 325 (65 FE) MG tablet Take 325 mg by mouth daily with breakfast.    . gabapentin (NEURONTIN) 300 MG capsule Take 600-900 mg by mouth See admin instructions. Take 300 mg every morning and900 mg at bedtime    . HYDROcodone-acetaminophen (NORCO/VICODIN) 5-325 MG tablet Take 1 tablet by mouth every 4 (four) hours as needed for moderate pain or severe pain.     Marland Kitchen ipratropium (ATROVENT) 0.06 % nasal spray Place 1 spray into the nose as needed.    Marland Kitchen levothyroxine (SYNTHROID, LEVOTHROID) 125 MCG tablet Take 125 mcg by mouth daily before breakfast.     . loratadine-pseudoephedrine (CLARITIN-D 12-HOUR) 5-120 MG per tablet Take 1 tablet by mouth daily.    . methocarbamol (ROBAXIN) 750 MG tablet Take 750 mg by mouth 2 (two) times daily as needed for muscle spasms.     . metoprolol succinate (TOPROL-XL) 50 MG 24 hr tablet Take 1 tablet (50 mg total) by mouth 2 (two) times daily. 180 tablet 3  . MYRBETRIQ 50 MG TB24 tablet Take 50 mg by mouth at bedtime.    Marland Kitchen  nitrofurantoin, macrocrystal-monohydrate, (MACROBID) 100 MG capsule Take 100 mg by mouth daily.     Marland Kitchen olmesartan (BENICAR) 40 MG tablet Take 1 tablet (40 mg total) by mouth daily. 90 tablet 3  . senna-docusate (SENOKOT-S) 8.6-50 MG tablet Take 2 tablets by mouth 2 (two) times a day.    Marnee Guarneri Isoflavones (SOY BALANCE PO) Take 1 tablet by mouth daily. Soy bean extract    . traZODone (DESYREL) 50 MG tablet Take 50 mg by mouth as needed.    . zolpidem (AMBIEN) 5 MG tablet TAKE  1 TABLET BY MOUTH ONCE DAILY AT BEDTIME 90 tablet 0   No current facility-administered medications for this encounter.    Physical Findings: The patient is in no acute distress. Patient is alert and oriented.  height is 4' 9.75" (1.467 m) and weight is 159 lb 8 oz (72.3 kg). Her temporal temperature is 97.5 F (36.4 C) (abnormal). Her blood pressure is 116/59 (abnormal) and her pulse is 67. Her respiration is 18 and oxygen saturation is 98%. .  No significant changes. Lungs are clear to auscultation bilaterally. Heart has regular rate and rhythm. No palpable cervical, supraclavicular, or axillary adenopathy. Abdomen soft, non-tender, normal bowel sounds.  Skin along the right medial thigh has healed well.  Mild hyperpigmentation changes.  No dominant masses palpated. The edema noted throughout the right lower extremity.  Peripheral pulses intact in the right leg  Lab Findings: Lab Results  Component Value Date   WBC 7.6 07/21/2019   HGB 12.0 07/21/2019   HCT 36.2 07/21/2019   MCV 99.5 07/21/2019   PLT 313 07/21/2019    Radiographic Findings: No results found.  Impression: No evidence of recurrence on clinical exam today.  Recent MRI shows no evidence of local recurrence and chest x-ray shows no signs of metastatic disease  Plan: Routine follow-up with radiation oncology in 6 months.  She will follow-up with surgical oncology in the interim at [facility].  ____________________________________   Blair Promise, PhD, MD  This document serves as a record of services personally performed by Gery Pray, MD. It was created on his behalf by Clerance Lav, a trained medical scribe. The creation of this record is based on the scribe's personal observations and the provider's statements to them. This document has been checked and approved by the attending provider.

## 2020-01-09 ENCOUNTER — Ambulatory Visit (INDEPENDENT_AMBULATORY_CARE_PROVIDER_SITE_OTHER): Payer: Medicare Other

## 2020-01-09 VITALS — Ht <= 58 in | Wt 158.0 lb

## 2020-01-09 DIAGNOSIS — Z0000 Encounter for general adult medical examination without abnormal findings: Secondary | ICD-10-CM | POA: Diagnosis not present

## 2020-01-09 NOTE — Patient Instructions (Addendum)
[name] , Thank you for taking time to participate in your Medicare Wellness Visit. I appreciate your ongoing commitment to your health goals. Please review the following plan we discussed and let me know if I can assist you in the future.   Screening recommendations/referrals: Colorectal Screening: colonoscopy completed 03/25/2019. Up to date. Mammogram: completed 09/01/2017 and patient declines further testing due to her age.  Bone Density: completed 07/27/2014; patient declines further testing due to her age.   Vision and Dental Exams: Recommended annual ophthalmology exams for early detection of glaucoma and other disorders of the eye Recommended annual dental exams for proper oral hygiene  Diabetic Exams: Diabetic Eye Exam: N/A Diabetic Foot Exam: N/A   Vaccinations: Influenza vaccine: completed 07/29/2019; due in Fall 2021. Pneumococcal vaccine: completed 11/25/2004 & 06/28/2013. Up to date.  Tdap vaccine: completed 06/28/2013. Due 06/29/2023. Shingles vaccine: Please call your insurance company to determine your out of pocket expense for the Shingrix vaccine. You may receive this vaccine at your local pharmacy.  Advanced directives: Advance directives discussed with you today. Please bring a copy of your POA (Power of [place]) and/or Living Will to your next appointment.  Goals: Recommend to drink at least 6-8 8oz glasses of water per day.  Recommend to exercise for at least 150 minutes per week.  Recommend to remove any items from the home that may cause slips or trips.  Next appointment: Please schedule your Annual Wellness Visit with your Nurse Health Advisor in one year.  Preventive Care 51 Years and Older, Female Preventive care refers to lifestyle choices and visits with your health care provider that can promote health and wellness. What does preventive care include?  A yearly physical exam. This is also called an annual well check.  Dental exams once or twice a  year.  Routine eye exams. Ask your health care provider how often you should have your eyes checked.  Personal lifestyle choices, including:  Daily care of your teeth and gums.  Regular physical activity.  Eating a healthy diet.  Avoiding tobacco and drug use.  Limiting alcohol use.  Practicing safe sex.  Taking low-dose aspirin every day if recommended by your health care provider.  Taking vitamin and mineral supplements as recommended by your health care provider. What happens during an annual well check? The services and screenings done by your health care provider during your annual well check will depend on your age, overall health, lifestyle risk factors, and family history of disease. Counseling  Your health care provider may ask you questions about your:  Alcohol use.  Tobacco use.  Drug use.  Emotional well-being.  Home and relationship well-being.  Sexual activity.  Eating habits.  History of falls.  Memory and ability to understand (cognition).  Work and work Statistician.  Reproductive health. Screening  You may have the following tests or measurements:  Height, weight, and BMI.  Blood pressure.  Lipid and cholesterol levels. These may be checked every 5 years, or more frequently if you are over 33 years old.  Skin check.  Lung cancer screening. You may have this screening every year starting at age 30 if you have a 30-pack-year history of smoking and currently smoke or have quit within the past 15 years.  Fecal occult blood test (FOBT) of the stool. You may have this test every year starting at age 45.  Flexible sigmoidoscopy or colonoscopy. You may have a sigmoidoscopy every 5 years or a colonoscopy every 10 years starting at age  50.  Hepatitis C blood test.  Hepatitis B blood test.  Sexually transmitted disease (STD) testing.  Diabetes screening. This is done by checking your blood sugar (glucose) after you have not eaten for a while  (fasting). You may have this done every 1-3 years.  Bone density scan. This is done to screen for osteoporosis. You may have this done starting at age 4.  Mammogram. This may be done every 1-2 years. Talk to your health care provider about how often you should have regular mammograms. Talk with your health care provider about your test results, treatment options, and if necessary, the need for more tests. Vaccines  Your health care provider may recommend certain vaccines, such as:  Influenza vaccine. This is recommended every year.  Tetanus, diphtheria, and acellular pertussis (Tdap, Td) vaccine. You may need a Td booster every 10 years.  Zoster vaccine. You may need this after age 23.  Pneumococcal 13-valent conjugate (PCV13) vaccine. One dose is recommended after age 46.  Pneumococcal polysaccharide (PPSV23) vaccine. One dose is recommended after age 84. Talk to your health care provider about which screenings and vaccines you need and how often you need them. This information is not intended to replace advice given to you by your health care provider. Make sure you discuss any questions you have with your health care provider. Document Released: 12/07/2015 Document Revised: 07/30/2016 Document Reviewed: 09/11/2015 Elsevier Interactive Patient Education  2017 [employer] Prevention in the Home Falls can cause injuries. They can happen to people of all ages. There are many things you can do to make your home safe and to help prevent falls. What can I do on the outside of my home?  Regularly fix the edges of walkways and driveways and fix any cracks.  Remove anything that might make you trip as you walk through a door, such as a raised step or threshold.  Trim any bushes or trees on the path to your home.  Use bright outdoor lighting.  Clear any walking paths of anything that might make someone trip, such as rocks or tools.  Regularly check to see if handrails are loose  or broken. Make sure that both sides of any steps have handrails.  Any raised decks and porches should have guardrails on the edges.  Have any leaves, snow, or ice cleared regularly.  Use sand or salt on walking paths during winter.  Clean up any spills in your garage right away. This includes oil or grease spills. What can I do in the bathroom?  Use night lights.  Install grab bars by the toilet and in the tub and shower. Do not use towel bars as grab bars.  Use non-skid mats or decals in the tub or shower.  If you need to sit down in the shower, use a plastic, non-slip stool.  Keep the floor dry. Clean up any water that spills on the floor as soon as it happens.  Remove soap buildup in the tub or shower regularly.  Attach bath mats securely with double-sided non-slip rug tape.  Do not have throw rugs and other things on the floor that can make you trip. What can I do in the bedroom?  Use night lights.  Make sure that you have a light by your bed that is easy to reach.  Do not use any sheets or blankets that are too big for your bed. They should not hang down onto the floor.  Have a firm chair that  has side arms. You can use this for support while you get dressed.  Do not have throw rugs and other things on the floor that can make you trip. What can I do in the kitchen?  Clean up any spills right away.  Avoid walking on wet floors.  Keep items that you use a lot in easy-to-reach places.  If you need to reach something above you, use a strong step stool that has a grab bar.  Keep electrical cords out of the way.  Do not use floor polish or wax that makes floors slippery. If you must use wax, use non-skid floor wax.  Do not have throw rugs and other things on the floor that can make you trip. What can I do with my stairs?  Do not leave any items on the stairs.  Make sure that there are handrails on both sides of the stairs and use them. Fix handrails that are  broken or loose. Make sure that handrails are as long as the stairways.  Check any carpeting to make sure that it is firmly attached to the stairs. Fix any carpet that is loose or worn.  Avoid having throw rugs at the top or bottom of the stairs. If you do have throw rugs, attach them to the floor with carpet tape.  Make sure that you have a light switch at the top of the stairs and the bottom of the stairs. If you do not have them, ask someone to add them for you. What else can I do to help prevent falls?  Wear shoes that:  Do not have high heels.  Have rubber bottoms.  Are comfortable and fit you well.  Are closed at the toe. Do not wear sandals.  If you use a stepladder:  Make sure that it is fully opened. Do not climb a closed stepladder.  Make sure that both sides of the stepladder are locked into place.  Ask someone to hold it for you, if possible.  Clearly mark and make sure that you can see:  Any grab bars or handrails.  First and last steps.  Where the edge of each step is.  Use tools that help you move around (mobility aids) if they are needed. These include:  Canes.  Walkers.  Scooters.  Crutches.  Turn on the lights when you go into a dark area. Replace any light bulbs as soon as they burn out.  Set up your furniture so you have a clear path. Avoid moving your furniture around.  If any of your floors are uneven, fix them.  If there are any pets around you, be aware of where they are.  Review your medicines with your doctor. Some medicines can make you feel dizzy. This can increase your chance of falling. Ask your doctor what other things that you can do to help prevent falls. This information is not intended to replace advice given to you by your health care provider. Make sure you discuss any questions you have with your health care provider. Document Released: 09/06/2009 Document Revised: 04/17/2016 Document Reviewed: 12/15/2014 Elsevier  Interactive Patient Education  2017 [employer].

## 2020-01-09 NOTE — Progress Notes (Signed)
This visit is being conducted via phone call due to the COVID-19 pandemic. This patient has given me verbal consent via phone to conduct this visit, patient states they are participating from their home address. Some vital signs may be absent or patient reported.   Patient identification: identified by name, DOB, and current address.  Location provider: [facility], Office Persons participating in the virtual visit: Mrs. Muslimah Hassel and Franne Forts, LPN.    Subjective:   Destiny [name] is a 79 y.o. female who presents for Medicare Annual (Subsequent) preventive examination.  Mrs. Villagran is walking 1/2 - 1 miles every other day. She is eating health, balanced meals and drinking plenty of water. She reports that she is having chronic pain in neck, hips, and upper extremities related to errosive osteoarthritis and she also is dealing with lymphedema in her right lower leg since last year due to a surgery to that area related to cancer. She has received both covid vaccines now. She also reports that she and her husband are now on the waiting list at [facility] and they will be transitioning there when the time comes.   Review of Systems:  No ROS; Annual Medicare Wellness Visit  Cardiac Risk Factors include: advanced age (>79men, >40 women);hypertension;sedentary lifestyle     Objective:     Vitals: Ht 4\' 10"  (1.473 m)   Wt 158 lb (71.7 kg)   BMI 33.02 kg/m   Body mass index is 33.02 kg/m.  Advanced Directives 12/26/2019 09/26/2019 06/20/2019 06/13/2019 04/28/2019 04/04/2019 10/02/2017  Does Patient Have a Medical Advance Directive? Yes - Yes Yes No Yes Yes  Type of Paramedic of [place];Living will [place];Living will Living will Living will - [place];Living will Living will;Healthcare Power of Attorney  Does patient want to make changes to medical advance directive? - - - - - No - Patient declined No - Patient  declined  Copy of [place] in Chart? No - copy requested - - - - No - copy requested No - copy requested  Would patient like information on creating a medical advance directive? - - - - No - Patient declined - -    Tobacco Social History   Tobacco Use  Smoking Status Never Smoker  Smokeless Tobacco Never Used     Counseling given: Not Answered   Clinical Intake:  Pre-visit preparation completed: Yes  Pain : 0-10 Pain Score: 4  Pain Type: Chronic pain Pain Location: (neck, hips, upper extremities)     BMI - recorded: 33.02 Nutritional Status: BMI > 30  Obese Nutritional Risks: Unintentional weight loss Diabetes: No  How often do you need to have someone help you when you read instructions, pamphlets, or other written materials from your doctor or pharmacy?: 1 - Never What is the last grade level you completed in school?: retired Architect Needed?: No  Information entered by :: Franne Forts, LPN.  Past Medical History:  Diagnosis Date  . Anxiety   . Arthritis   . Colon polyps   . Complication of anesthesia    BP drops with fentyl and versed  . Hepatitis    hep A  1976  . Hypertension   . Hypothyroidism   . Liposarcoma of right thigh ([place]) 03/2019   Past Surgical History:  Procedure Laterality Date  . ABDOMINAL HYSTERECTOMY    . APPENDECTOMY    . BACK SURGERY      fusion  .  CESAREAN SECTION     times 2  . EYE SURGERY Left    cataract removed  . FINGER ARTHRODESIS Left 10/02/2017   Procedure: LEFT LONG FINGER ARTHRODESIS;  Surgeon: Milly Jakob, MD;  Location: [facility];  Service: Orthopedics;  Laterality: Left;  . FOOT SURGERY Bilateral   . FRACTURE SURGERY     broken ankle  . MEDIAL PARTIAL KNEE REPLACEMENT Left    done at [facility] Right 12/28/2014   Procedure: REVERSE SHOULDER ARTHROPLASTY;  Surgeon: Nita Sells, MD;  Location: [facility];  Service: Orthopedics;   Laterality: Right;  Right reverse total shoulder arthroplasty  . REVERSE TOTAL SHOULDER ARTHROPLASTY Right 12/28/2014   DR CHANDLER  . rotator cufff    . THYROIDECTOMY, PARTIAL     one lobe removed  . VARICOSE VEIN SURGERY Right    Family History  Problem Relation Age of Onset  . Heart disease Mother   . Atrial fibrillation Mother   . Cancer - Other Father    Social History   Socioeconomic History  . Marital status: Married    Spouse name: Not on file  . Number of children: Not on file  . Years of education: Not on file  . Highest education level: Not on file  Occupational History  . Occupation: retired    Comment: Therapist, sports  Tobacco Use  . Smoking status: Never Smoker  . Smokeless tobacco: Never Used  Substance and Sexual Activity  . Alcohol use: Yes    Comment: rarely  . Drug use: No  . Sexual activity: Not on file  Other Topics Concern  . Not on file  Social History Narrative   Married   [place] 2   Lives in 1 story ranch in [place] and also has mountain home in [place] with stairs   Social Determinants of Health   Financial Resource Strain: Low Risk   . Difficulty of Paying Living Expenses: Not very hard  Food Insecurity: No Food Insecurity  . Worried About Charity fundraiser in the Last Year: Never true  . Ran Out of Food in the Last Year: Never true  Transportation Needs: No Transportation Needs  . Lack of Transportation (Medical): No  . Lack of Transportation (Non-Medical): No  Physical Activity: Insufficiently Active  . Days of Exercise per Week: 4 days  . Minutes of Exercise per Session: 30 min  Stress: Stress Concern Present  . Feeling of Stress : Rather much  Social Connections: Not Isolated  . Frequency of Communication with Friends and Family: More than three times a week  . Frequency of Social Gatherings with Friends and Family: Twice a week  . Attends Religious Services: More than 4 times per year  . Active Member of Clubs or Organizations: Yes  .  Attends Archivist Meetings: More than 4 times per year  . Marital Status: Married    Outpatient Encounter Medications as of 01/09/2020  Medication Sig  . acetaminophen (TYLENOL) 500 MG tablet Take 500 mg by mouth every 4 (four) hours as needed.  Marland Kitchen amLODipine (NORVASC) 5 MG tablet Take 1 tablet (5 mg total) by mouth daily.  Marland Kitchen b complex vitamins capsule Take 1 capsule by mouth daily.  . Calcium Carb-Cholecalciferol 600-200 MG-UNIT TABS Take 1 tablet by mouth daily.   . cholecalciferol (VITAMIN D) 1000 UNITS tablet Take 2,000 Units by mouth daily.  . ferrous sulfate 325 (65 FE) MG tablet Take 325 mg by mouth  daily with breakfast.  . gabapentin (NEURONTIN) 300 MG capsule Take 600-900 mg by mouth See admin instructions. Take 300 mg every morning and900 mg at bedtime  . HYDROcodone-acetaminophen (NORCO/VICODIN) 5-325 MG tablet Take 1 tablet by mouth every 4 (four) hours as needed for moderate pain or severe pain.   Marland Kitchen ipratropium (ATROVENT) 0.06 % nasal spray Place 1 spray into the nose as needed.  Marland Kitchen levothyroxine (SYNTHROID, LEVOTHROID) 125 MCG tablet Take 125 mcg by mouth daily before breakfast.   . loratadine-pseudoephedrine (CLARITIN-D 12-HOUR) 5-120 MG per tablet Take 1 tablet by mouth daily.  . methocarbamol (ROBAXIN) 750 MG tablet Take 750 mg by mouth 2 (two) times daily as needed for muscle spasms.   . metoprolol succinate (TOPROL-XL) 50 MG 24 hr tablet Take 1 tablet (50 mg total) by mouth 2 (two) times daily.  Marland Kitchen MYRBETRIQ 50 MG TB24 tablet Take 50 mg by mouth at bedtime.  . nitrofurantoin, macrocrystal-monohydrate, (MACROBID) 100 MG capsule Take 100 mg by mouth daily.   Marland Kitchen olmesartan (BENICAR) 40 MG tablet Take 1 tablet (40 mg total) by mouth daily.  Marnee Guarneri Isoflavones (SOY BALANCE PO) Take 1 tablet by mouth daily. Soy bean extract  . traZODone (DESYREL) 50 MG tablet Take 50 mg by mouth as needed.  . zolpidem (AMBIEN) 5 MG tablet TAKE 1 TABLET BY MOUTH ONCE DAILY AT BEDTIME    . [DISCONTINUED] senna-docusate (SENOKOT-S) 8.6-50 MG tablet Take 2 tablets by mouth 2 (two) times a day.   No facility-administered encounter medications on file as of 01/09/2020.    Activities of Daily Living In your present state of health, do you have any difficulty performing the following activities: 01/09/2020 04/29/2019  Hearing? N Y  Comment - wears hearing aids at home   Vision? N N  Difficulty concentrating or making decisions? N N  Walking or climbing stairs? N N  Dressing or bathing? N N  Doing errands, shopping? N N  Preparing Food and eating ? N -  Using the Toilet? N -  In the past six months, have you accidently leaked urine? Y -  Do you have problems with loss of bowel control? N -  Managing your Medications? N -  Managing your Finances? N -  Housekeeping or managing your Housekeeping? N -  Some recent data might be hidden    Patient Care Team: Eulas Post, MD as PCP - General (Family Medicine)    Assessment:   This is a routine wellness examination for Yesina.  Exercise Activities and Dietary recommendations Current Exercise Habits: Home exercise routine, Type of exercise: walking, Time (Minutes): 30, Frequency (Times/Week): 4, Weekly Exercise (Minutes/Week): 120, Intensity: Moderate, Exercise limited by: orthopedic condition(s)  Goals    . transition to [place] goes well       Fall Risk Fall Risk  01/09/2020 01/13/2017 10/08/2016 08/20/2016 07/16/2016  Falls in the past year? 0 No No No No  Risk for fall due to : Medication side effect;Orthopedic patient;Other (Comment) - - - -  Risk for fall due to: Comment recent cancer diagnosis in 2020 - - - -  Follow up Falls evaluation completed;Education provided;Falls prevention discussed - - - -   Is the patient's home free of loose throw rugs in walkways, pet beds, electrical cords, etc?   yes      Grab bars in the bathroom? yes      Handrails on the stairs?   yes      Adequate lighting?  yes  Timed Get Up and Go performed: N/A due to telephone visit.   Depression Screen PHQ 2/9 Scores 01/09/2020 10/05/2019 01/13/2017 10/08/2016  PHQ - 2 Score 1 0 0 0  PHQ- 9 Score - 2 - -     Cognitive Function     6CIT Screen 01/09/2020  What Year? 0 points  What month? 0 points  What time? 0 points  Count back from 20 0 points  Months in reverse 0 points  Repeat phrase 0 points  Total Score 0    Immunization History  Administered Date(s) Administered  . Fluad Quad(high Dose 65+) 07/29/2019  . Influenza Whole 09/08/2008  . Influenza, High Dose Seasonal PF 09/25/2014  . Pneumococcal Polysaccharide-23 11/25/2004  . Td 11/25/2004    Qualifies for Shingles Vaccine?yes  Screening Tests Health Maintenance  Topic Date Due  . DEXA SCAN  01/28/2004  . TETANUS/TDAP  06/29/2023  . INFLUENZA VACCINE  Completed  . PNA vac Low Risk Adult  Completed    Cancer Screenings: Lung: Low Dose CT Chest recommended if Age 70-80 years, 30 pack-year currently smoking OR have quit w/in 15years. Patient does not qualify. Breast:  Up to date on Mammogram? Yes   Up to date of Bone Density/Dexa? Yes Colorectal: yes  Additional Screenings:  Hepatitis C Screening: N/A due to age.     Plan:   Mrs. Cotto will inquire at pharmacy about her out of pocket costs for shingrix vaccine series and obtain there if she can afford. She understands to wait 30-45 days before any immunization after last covid vaccine. She declines further mammograms and bone density testing due to her age. She is aged out of colonoscopy as well. She is up to date with all other immunizations.   I have personally reviewed and noted the following in the patient's chart:   . Medical and social history . Use of alcohol, tobacco or illicit drugs  . Current medications and supplements . Functional ability and status . Nutritional status . Physical activity . Advanced directives . List of other physicians . Hospitalizations,  surgeries, and ER visits in previous 12 months . Vitals . Screenings to include cognitive, depression, and falls . Referrals and appointments  In addition, I have reviewed and discussed with patient certain preventive protocols, quality metrics, and best practice recommendations. A written personalized care plan for preventive services as well as general preventive health recommendations were provided to patient.     Franne Forts, LPN  [id number]

## 2020-01-23 ENCOUNTER — Other Ambulatory Visit: Payer: Self-pay | Admitting: Family Medicine

## 2020-01-30 ENCOUNTER — Encounter: Payer: Self-pay | Admitting: Radiation Oncology

## 2020-02-07 ENCOUNTER — Other Ambulatory Visit: Payer: Self-pay | Admitting: Radiation Oncology

## 2020-02-07 ENCOUNTER — Other Ambulatory Visit: Payer: Self-pay | Admitting: Family Medicine

## 2020-02-07 DIAGNOSIS — C4921 Malignant neoplasm of connective and soft tissue of right lower limb, including hip: Secondary | ICD-10-CM

## 2020-02-07 NOTE — Telephone Encounter (Signed)
Last ov:10/05/2019 Last filled:11/05/2019

## 2020-02-16 ENCOUNTER — Other Ambulatory Visit: Payer: Self-pay

## 2020-02-16 ENCOUNTER — Ambulatory Visit: Payer: Medicare Other | Attending: Radiation Oncology

## 2020-02-16 DIAGNOSIS — M6281 Muscle weakness (generalized): Secondary | ICD-10-CM | POA: Insufficient documentation

## 2020-02-16 DIAGNOSIS — I890 Lymphedema, not elsewhere classified: Secondary | ICD-10-CM | POA: Insufficient documentation

## 2020-02-16 DIAGNOSIS — R2689 Other abnormalities of gait and mobility: Secondary | ICD-10-CM | POA: Diagnosis present

## 2020-02-16 NOTE — Therapy (Signed)
[place], [place], [address] Phone: [phone]   Fax:  [phone]  Physical Therapy Evaluation  Patient Details  Name: Destiny [name] MRN: [id number] Date of Birth: Mar 31, 1941 Referring Provider (PT): Dr. Gery Pray   Encounter Date: 02/16/2020  PT End of Session - 02/16/20 1122    Visit Number  1    Number of Visits  13    PT Start Time  [id number]    PT Stop Time  1105    PT Time Calculation (min)  58 min    Activity Tolerance  Patient tolerated treatment well    Behavior During Therapy  [facility] for tasks assessed/performed       Past Medical History:  Diagnosis Date  . Anxiety   . Arthritis   . Colon polyps   . Complication of anesthesia    BP drops with fentyl and versed  . Hepatitis    hep A  1976  . Hypertension   . Hypothyroidism   . Liposarcoma of right thigh ([place]) 03/2019    Past Surgical History:  Procedure Laterality Date  . ABDOMINAL HYSTERECTOMY    . APPENDECTOMY    . BACK SURGERY      fusion  . CESAREAN SECTION     times 2  . EYE SURGERY Left    cataract removed  . FINGER ARTHRODESIS Left 10/02/2017   Procedure: LEFT LONG FINGER ARTHRODESIS;  Surgeon: Milly Jakob, MD;  Location: [place];  Service: Orthopedics;  Laterality: Left;  . FOOT SURGERY Bilateral   . FRACTURE SURGERY     broken ankle  . MEDIAL PARTIAL KNEE REPLACEMENT Left    done at [place] Right 12/28/2014   Procedure: REVERSE SHOULDER ARTHROPLASTY;  Surgeon: Nita Sells, MD;  Location: [place];  Service: Orthopedics;  Laterality: Right;  Right reverse total shoulder arthroplasty  . REVERSE TOTAL SHOULDER ARTHROPLASTY Right 12/28/2014   DR CHANDLER  . rotator cufff    . THYROIDECTOMY, PARTIAL     one lobe removed  . VARICOSE VEIN SURGERY Right     There were no vitals filed for this visit.   Subjective Assessment - 02/16/20 1012     Subjective  Pt reports that she still cannot raise her leg. She has to bend down really far to put her pants and compression sock on due to weakness in her hip and has to assist her LLE into the car. Pt reports that she has had swelling in her RLE since surgery. She has a knee high compression sock that she was finally able to don the other day due to her swelling has continuously gone down since surgery but is still present. She has continued to walk regularly since her surgery but states she walks slow but has no significant difficulty.    Pertinent History  Liposarcoma of the Rt thigh with sartorious removed 11.5 cm intramuscular mass. Radical resection performed 04/15/19. Finished radiation 08/12/2019    Patient Stated Goals  I want to get this swelling down.    Currently in Pain?  No/denies         [facility] PT Assessment - 02/16/20 0001      Assessment   Medical Diagnosis  Rt liposarcoma    Referring Provider (PT)  Dr. Gery Pray    Onset Date/Surgical Date  04/15/19    Hand Dominance  Right      Precautions   Precaution Comments  Lymphedema      Restrictions   Weight Bearing Restrictions  No      Balance Screen   Has the patient fallen in the past 6 months  No    Has the patient had a decrease in activity level because of a fear of falling?   No    Is the patient reluctant to leave their home because of a fear of falling?   No      Home Environment   Living Environment  Private residence    Living Arrangements  Spouse/significant other    Home Access  Level entry    Home Layout  One level      Prior Function   Level of [place]  Retired    Leisure  walking her dog      Cognition   Overall Cognitive Status  Within Functional Limits for tasks assessed      Observation/Other Assessments   Observations  lymphedema up to the groin and into the abdomen     Skin Integrity  fibrotic skin changes due to the medial R thigh       ROM / Strength    AROM / PROM / Strength  Strength      Strength   Strength Assessment Site  Hip    Right Hip Flexion  3-/5        LYMPHEDEMA/ONCOLOGY QUESTIONNAIRE - 02/16/20 1022      Type   Cancer Type  Liposarcoma of the R sartorious      Surgeries   Other Surgery Date  04/15/19    Number Lymph Nodes Removed  0      Treatment   Active Chemotherapy Treatment  No    Past Chemotherapy Treatment  No    Active Radiation Treatment  No    Past Radiation Treatment  Yes    Date  08/12/19    Body Site  R posterior/medial thigh     Current Hormone Treatment  No    Past Hormone Therapy  No      What other symptoms do you have   Are you Having Heaviness or Tightness  Yes    Are you having pitting edema  No    Is it Hard or Difficult finding clothes that fit  Yes    Do you have infections  No    Is there Decreased scar mobility  Yes      Lymphedema Assessments   Lymphedema Assessments  Lower extremities      Right Lower Extremity Lymphedema   20 cm Proximal to Suprapatella  59 cm    10 cm Proximal to Suprapatella  50 cm    At Midpatella/Popliteal Crease  42.7 cm    30 cm Proximal to Floor at Lateral Plantar Foot  38.5 cm    20 cm Proximal to Floor at Lateral Plantar Foot  34.1 1    10  cm Proximal to Floor at Lateral Malleoli  25.8 cm    5 cm Proximal to 1st MTP Joint  21.3 cm    Across MTP Joint  22 cm    Around Proximal Great Toe  6.8 cm      Left Lower Extremity Lymphedema   20 cm Proximal to Suprapatella  54.2 cm    10 cm Proximal to Suprapatella  56.7 cm    At Midpatella/Popliteal Crease  27.7 cm    30 cm Proximal to Floor at Lateral Plantar Foot  34.4  cm    20 cm Proximal to Floor at Lateral Plantar Foot  27.7 cm    10 cm Proximal to Floor at Lateral Malleoli  21 cm    5 cm Proximal to 1st MTP Joint  19.5 cm    Across MTP Joint  21 cm    Around Proximal Great Toe  6.8 cm             Outpatient Rehab from 02/16/2020 in [place]   Lymphedema Life Impact Scale Total Score  25 %      Objective measurements completed on examination: See above findings.              PT Education - 02/16/20 1112    Education Details  Pt educated on complete decongestive therapy for lymphedema including skin care, exercise, compression wrapping and manual lymph drainage. Pt was educated in depth on the wrapping with demonstration, pictures, and she was shown the varying layersto the compression wrap; compression wrap was explained in detail and how it works as well as educated pt that the compression needs to be to the level of swelling in order to be effective. Pt was educated in detail on the anatomy of the upper thigh and even though she has had her sartorious muscle removed she should be able to flex her R hip due to the other muscles in this area. She was shown pictures and educated on muscle movements.    Person(s) Educated  Patient    Methods  Explanation;Demonstration;Verbal cues;Other (comment)   pictures, equipment.   Comprehension  Verbalized understanding       PT Short Term Goals - 02/16/20 1140      PT SHORT TERM GOAL #1   Title  Pt and spouse will be independent with self MLD and HEP for the R hip within 2 weeks in order to demonstrate autonomy of care.    Baseline  pt does not perform exercises for the R hip, she walks and does not know how to perform MLD    Time  2    Period  Weeks    Status  New    Target Date  03/08/20        PT Long Term Goals - 02/16/20 1141      PT LONG TERM GOAL #1   Title  Pt will have 2 cm reduction in the R LE from the calf to the proximal thigh within 4 weeks in order to decrease risk for infection due to fluid.    Baseline  see measurements.    Time  4    Period  Weeks    Status  New    Target Date  03/22/20      PT LONG TERM GOAL #2   Title  Pt will have thigh high compression garment to wear on a daily basis wtihin 4 weeks in order to manage lymphedema at home.     Baseline  pt has a knee high compression garment.    Time  4    Period  Weeks    Status  New    Target Date  03/22/20      PT LONG TERM GOAL #3   Title  Pt will demonstrate 4/5 strength in the R hip flexors in order to decrease risk for immobility and injury.    Baseline  3-/5 strength R hip flexion    Time  4    Period  Weeks    Status  New    Target Date  03/22/20             Plan - 02/16/20 1123    Clinical Impression Statement  Pt presents to physicla therapy with significant edema in her R lower leg from the R ankle to the R groin level with puckering and adhesions noted at incision site. Pt reports that she has a knee high stocking that she has been wearing and is now able to get on due to the swelling has been going down since her surgery in May of 2020. Pt has significant weakness noted in her R hip and was contributing it to the fact that she had her sartorious removed; pt was educated on the role of the sartorious vs. psoas/quads. Pt states that she feels CDT is very invasive, she lives 12 miles away and she is concerned about how the wrap will look. She will come to a couple of visits to see if she wants to continue with physical therapy to get the swelling out of her leg but states "[place] pool [place] and this seems invasive." Pt was given options including learning self wrapping at home or coming to physical therapy. She was educated on difficulties that can arise from increased swelling including infection but pt stated "the surgeon said the swelling isn't a problem unless I think it is a problem." Pt decided to bring her spouse to a couple of visits to see how the wrapping looks. Skilled physical therapy services recommended 3x/week for 4 weeks in order to decrease swelling and improved strength/motion in the R hip in order to decrease risk for infection and immobility.    Personal Factors and Comorbidities  Age;Fitness;Behavior  Pattern;Comorbidity 3+;Social Background    Comorbidities  surgical resection, radiation, OA causing knee pain, priorities    Stability/Clinical Decision Making  Stable/Uncomplicated    Clinical Decision Making  Low    Rehab Potential  Fair    PT Frequency  3x / week   depending on pt   PT Duration  4 weeks    PT Treatment/Interventions  Therapeutic exercise;Therapeutic activities;Neuromuscular re-education;Manual techniques    PT Next Visit Plan  Teach wrapping first then self MLD if time. 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 Essential tremor 10/05/2019  . Near syncope 04/28/2019  . Adjustment disorder with mixed anxiety and depressed mood 04/28/2019  . Liposarcoma of right thigh ([place]) 04/28/2019  . Osteoarthritis of right knee 03/10/2019  . Hip pain, bilateral 01/07/2018  . Sinus tarsi syndrome 08/20/2016  . Right knee pain 07/17/2016  . S/p reverse total shoulder arthroplasty 12/28/2014  . Abnormality of gait 04/25/2014  . Foot pain 06/03/2011  . Metatarsalgia 06/03/2011  . Chronic pain syndrome 12/05/2009  . Chronic insomnia 08/15/2009  . INGROWN TOENAIL 06/06/2009  . LEG PAIN, BILATERAL 06/05/2009  . OTHER  VITAMIN B12 DEFICIENCY ANEMIA 04/04/2009  . MALAISE AND FATIGUE 02/28/2009  . Hypothyroidism 07/26/2008  . HYPERLIPIDEMIA 11/05/2007  . NEUROPATHY, IDIOPATHIC PERIPHERAL NEC 09/16/2007  . Essential hypertension  01/28/2007  . Arthritis of hand, degenerative 01/28/2007  . ROTATOR CUFF INJURY, RIGHT SHOULDER 01/28/2007  . OSTEOPENIA 01/28/2007    Ander Purpura, PT 02/16/2020, 11:44 AM  [place], [place], [address] Phone: [phone]   Fax:  [phone]  Name: Mykael Houze MRN: [id number] Date of Birth: [birth year]

## 2020-02-20 ENCOUNTER — Ambulatory Visit: Payer: Medicare Other

## 2020-02-20 ENCOUNTER — Other Ambulatory Visit: Payer: Self-pay

## 2020-02-20 DIAGNOSIS — I890 Lymphedema, not elsewhere classified: Secondary | ICD-10-CM | POA: Diagnosis not present

## 2020-02-20 DIAGNOSIS — R2689 Other abnormalities of gait and mobility: Secondary | ICD-10-CM

## 2020-02-20 DIAGNOSIS — M6281 Muscle weakness (generalized): Secondary | ICD-10-CM

## 2020-02-20 NOTE — Therapy (Addendum)
[place], [place], [address] Phone: [phone]   Fax:  [phone]  Physical Therapy Treatment  Patient Details  Name: Destiny [name] MRN: [id number] Date of Birth: 1941/06/10 Referring Provider (PT): Dr. Gery Pray   Encounter Date: 02/20/2020  PT End of Session - 02/20/20 1246    Visit Number  2    Number of Visits  13    Date for PT Re-Evaluation  03/15/20    PT Start Time  0902    PT Stop Time  1009    PT Time Calculation (min)  67 min    Activity Tolerance  Patient tolerated treatment well    Behavior During Therapy  [facility] for tasks assessed/performed       Past Medical History:  Diagnosis Date  . Anxiety   . Arthritis   . Colon polyps   . Complication of anesthesia    BP drops with fentyl and versed  . Hepatitis    hep A  1976  . Hypertension   . Hypothyroidism   . 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 ABDOMINAL HYSTERECTOMY    . APPENDECTOMY    . BACK SURGERY      fusion  . CESAREAN SECTION     times 2  . EYE SURGERY Left    cataract removed  . FINGER ARTHRODESIS Left 10/02/2017   Procedure: LEFT LONG FINGER ARTHRODESIS;  Surgeon: Milly Jakob, MD;  Location: [place];  Service: Orthopedics;  Laterality: Left;  . FOOT SURGERY Bilateral   . FRACTURE SURGERY     broken ankle  . MEDIAL PARTIAL KNEE REPLACEMENT Left    done at [place] Right 12/28/2014   Procedure: REVERSE SHOULDER ARTHROPLASTY;  Surgeon: Nita Sells, MD;  Location: [place];  Service: Orthopedics;  Laterality: Right;  Right reverse total shoulder arthroplasty  . REVERSE TOTAL SHOULDER ARTHROPLASTY Right 12/28/2014   DR CHANDLER  . rotator cufff    . THYROIDECTOMY, PARTIAL     one lobe removed  . VARICOSE VEIN SURGERY Right     There were no vitals filed for this  visit.  Subjective Assessment - 02/20/20 0909    Subjective  Nothing new since my evaluation.    Pertinent History  Liposarcoma of the Rt thigh with sartorious removed 11.5 cm intramuscular mass. Radical resection performed 04/15/19. Finished radiation 08/12/2019    Patient Stated Goals  I want to get this swelling down.    Currently in Pain?  No/denies                  Outpatient Rehab from 02/16/2020 in [facility]  Lymphedema Life Impact Scale Total Score  25 %           [facility] Adult PT Treatment/Exercise - 02/20/20 0001      Self-Care   Self-Care  Other Self-Care Comments    Other Self-Care Comments   Spent large portion of treatment educating pt and answering her questions in regards to lymphedema by definition, how this is progressive and chronic by nature, and how if left untreated as she is considering, could present more problems for her down the road. Also educated her about velcro compression garments for Rt LE as by end of session she reports her LE was uncomfortable in bandage.       Manual Therapy   Manual  Therapy  Compression Bandaging    Compression Bandaging  Biotone cream to Rt LE, then thick stockinette (size med from foot-knee, then size lrg to thigh); Artiflex x1 from foot to knee, 1-6 cm lightly applied to foot and lower leg with 1/4" gray foam to anterior ankle where pt c/o pain/tenderness, 1-10 from ankle to knee, then artiflex x1 and 2-12 cm from foot to mid thigh begining to instruct pt.                PT Short Term Goals - 02/16/20 1140      PT SHORT TERM GOAL #1   Title  Pt and spouse will be independent with self MLD and HEP for the R hip within 2 weeks in order to demonstrate autonomy of care.    Baseline  pt does not perform exercises for the R hip, she walks and does not know how to perform MLD    Time  2    Period  Weeks    Status  New    Target Date  03/08/20        PT Long Term Goals -  02/16/20 1141      PT LONG TERM GOAL #1   Title  Pt will have 2 cm reduction in the R LE from the calf to the proximal thigh within 4 weeks in order to decrease risk for infection due to fluid.    Baseline  see measurements.    Time  4    Period  Weeks    Status  New    Target Date  03/22/20      PT LONG TERM GOAL #2   Title  Pt will have thigh high compression garment to wear on a daily basis wtihin 4 weeks in order to manage lymphedema at home.    Baseline  pt has a knee high compression garment.    Time  4    Period  Weeks    Status  New    Target Date  03/22/20      PT LONG TERM GOAL #3   Title  Pt will demonstrate 4/5 strength in the R hip flexors in order to decrease risk for immobility and injury.    Baseline  3-/5 strength R hip flexion    Time  4    Period  Weeks    Status  New    Target Date  03/22/20            Plan - 02/20/20 1249    Clinical Impression Statement  Pt returns to therapy today to learn compression bandagin for her Rt LE lymphedema. Pt had multiple questions regarding what lymphedema is and why she needs to worry about trying to reduce this in her leg so spent alot of time educating pt today about chronic and progressive nature of this condition. She kept returning to "my doctor says it isn't a problem if it doesn't bother me"/ But reminded pt she is already c/o increased joint pain from osteoarthritis, is at an increased risk for infection and could potnetially cause other problems in her leg if swelling persists and continues to worsen, like decreased mobility and general function. Once bandages applied (had to redo foot mutliple times as pt continues to report discomfort, so added foam at anterior ankle and this helped some), pt reports them feeling uncomfortable and not sure she wants to do this invasive treatment. Educated pt that this is minimally invasive as it does not require surgery  or medication, just wrapping on legs that can be removed, if  needed, at any time. Pt at this time seems to be a good candidate for velcro compression for her LE due to ease of donning and doffing and pt was agreeable to this and reports can afford this. So she is agreeable to demographics being sent to [facility] so she can be measured for thigh high velcro. Husband not present otday as pt reports she wanted to see after today if she was planning on returning.    Personal Factors and Comorbidities  Age;Fitness;Behavior Pattern;Comorbidity 3+;Social Background    Comorbidities  surgical resection, radiation, OA causing knee pain, priorities    Stability/Clinical Decision Making  Stable/Uncomplicated    Rehab Potential  Fair    PT Frequency  3x / week   depending on pt, may be less   PT Duration  4 weeks    PT Treatment/Interventions  Therapeutic exercise;Therapeutic activities;Neuromuscular re-education;Manual techniques    PT Next Visit Plan  See what time pt would like to be fitted on 03/02/20 with [facility]. See if pt tolerated wrapping and possibly cont until she can be measured for velcro compression; begin and instruct in manual lymph drainage to Rt LE; Pt may bring spouse to learn either bandaging or MLD; easy hip strengthening exercises    Recommended Other Services  Sent demographics to [place] for velcro compression for Rt LE    Consulted and Agree with Plan of Care  Patient       Patient will benefit from skilled therapeutic intervention in order to improve the following deficits and impairments:  Decreased strength, Increased edema  Visit Diagnosis: Lymphedema  Muscle weakness (generalized)  Other abnormalities of gait and mobility     Problem List Patient Active Problem List   Diagnosis Date Noted  . Essential tremor 10/05/2019  . Near syncope 04/28/2019  . Adjustment disorder with mixed anxiety and depressed mood 04/28/2019  . Liposarcoma of right thigh ([place]) 04/28/2019  . Osteoarthritis of right knee 03/10/2019  . Hip pain, bilateral  01/07/2018  . Sinus tarsi syndrome 08/20/2016  . Right knee pain 07/17/2016  . S/p reverse total shoulder arthroplasty 12/28/2014  . Abnormality of gait 04/25/2014  . Foot pain 06/03/2011  . Metatarsalgia 06/03/2011  . Chronic pain syndrome 12/05/2009  . Chronic insomnia 08/15/2009  . INGROWN TOENAIL 06/06/2009  . LEG PAIN, BILATERAL 06/05/2009  . OTHER VITAMIN B12 DEFICIENCY ANEMIA 04/04/2009  . MALAISE AND FATIGUE 02/28/2009  . Hypothyroidism 07/26/2008  . HYPERLIPIDEMIA 11/05/2007  . NEUROPATHY, IDIOPATHIC PERIPHERAL NEC 09/16/2007  . Essential hypertension 01/28/2007  . Arthritis of hand, degenerative 01/28/2007  . ROTATOR CUFF INJURY, RIGHT SHOULDER 01/28/2007  . OSTEOPENIA 01/28/2007    Otelia Limes, PTA 02/20/2020, 5:17 PM  [place], [place], [address] Phone: [phone]   Fax:  [phone]  Name: Destiny [name] MRN: [id number] Date of Birth: Mar 15, 1941

## 2020-02-23 ENCOUNTER — Encounter: Payer: Self-pay | Admitting: Sports Medicine

## 2020-02-23 ENCOUNTER — Other Ambulatory Visit: Payer: Self-pay

## 2020-02-23 ENCOUNTER — Ambulatory Visit: Payer: Medicare Other | Admitting: Sports Medicine

## 2020-02-23 VITALS — BP 148/69 | Ht 61.0 in | Wt 155.0 lb

## 2020-02-23 DIAGNOSIS — M79672 Pain in left foot: Secondary | ICD-10-CM | POA: Diagnosis not present

## 2020-02-23 NOTE — Progress Notes (Signed)
PCP: Eulas Post, MD  Subjective:   HPI: Patient is a 79 y.o. female here for evaluation of left foot pain.  Patient has pain is been present for the last several months.  She has a history of posterior tibial tendon translocation.  She is also had several other foot surgeries on her left foot.  Patient notes the pain is located right over the surgical scar from where she had her surgery.  Patient denies any recent injury or trauma.  Of note she has had recent surgery to have a tumor (grade 4 liposarcoma) removed from her right leg.  Patient has subsequently developed lymphedema of her right leg.  She just started going to lymphedema clinic.  Patient believes that due to this she has been putting increased pressure on her left foot which may be aggravating the pain.  She notices some swelling over the area of pain.  She denies any bruising.  She denies any numbness or tingling.  Patient also wears orthotics in her feet due to history of chronic foot deformities.   Review of Systems: See HPI above.  Past Medical History:  Diagnosis Date  . Anxiety   . Arthritis   . Colon polyps   . Complication of anesthesia    BP drops with fentyl and versed  . Hepatitis    hep A  1976  . Hypertension   . Hypothyroidism   . 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 acetaminophen (TYLENOL) 500 MG tablet Take 500 mg by mouth every 4 (four) hours as needed.    Marland Kitchen amLODipine (NORVASC) 5 MG tablet Take 1 tablet (5 mg total) by mouth daily. 90 tablet 3  . b complex vitamins capsule Take 1 capsule by mouth daily.    . Calcium Carb-Cholecalciferol 600-200 MG-UNIT TABS Take 1 tablet by mouth daily.     . cholecalciferol (VITAMIN D) 1000 UNITS tablet Take 2,000 Units by mouth daily.    . ferrous sulfate 325 (65 FE) MG tablet Take 325 mg by mouth daily with breakfast.    . gabapentin (NEURONTIN) 300 MG capsule Take  600-900 mg by mouth See admin instructions. Take 300 mg every morning and900 mg at bedtime    . HYDROcodone-acetaminophen (NORCO/VICODIN) 5-325 MG tablet Take 1 tablet by mouth every 4 (four) hours as needed for moderate pain or severe pain.     Marland Kitchen ipratropium (ATROVENT) 0.06 % nasal spray PLACE 2 SPRAYS IN EACH NOSTRIL 3 TIMES A DAY 15 mL 6  . levothyroxine (SYNTHROID, LEVOTHROID) 125 MCG tablet Take 125 mcg by mouth daily before breakfast.     . loratadine-pseudoephedrine (CLARITIN-D 12-HOUR) 5-120 MG per tablet Take 1 tablet by mouth daily.    . methocarbamol (ROBAXIN) 750 MG tablet Take 750 mg by mouth 2 (two) times daily as needed for muscle spasms.     . metoprolol succinate (TOPROL-XL) 50 MG 24 hr tablet Take 1 tablet (50 mg total) by mouth 2 (two) times daily. 180 tablet 3  . MYRBETRIQ 50 MG TB24 tablet Take 50 mg by mouth at bedtime.    . nitrofurantoin, macrocrystal-monohydrate, (MACROBID) 100 MG capsule Take 100 mg by mouth daily.     Marland Kitchen olmesartan (BENICAR) 40 MG tablet Take 1 tablet (40 mg total) by mouth daily. 90 tablet 3  . Soybean-Soy Isoflavones (SOY BALANCE PO) Take 1 tablet by mouth daily. Soy bean extract    .  traZODone (DESYREL) 50 MG tablet Take 50 mg by mouth as needed.    . zolpidem (AMBIEN) 5 MG tablet TAKE 1 TABLET BY MOUTH ONCE DAILY AT BEDTIME 90 tablet 0   No current facility-administered medications on file prior to visit.    Past Surgical History:  Procedure Laterality Date  . ABDOMINAL HYSTERECTOMY    . APPENDECTOMY    . BACK SURGERY      fusion  . CESAREAN SECTION     times 2  . EYE SURGERY Left    cataract removed  . FINGER ARTHRODESIS Left 10/02/2017   Procedure: LEFT LONG FINGER ARTHRODESIS;  Surgeon: Milly Jakob, MD;  Location: [facility];  Service: Orthopedics;  Laterality: Left;  . FOOT SURGERY Bilateral   . FRACTURE SURGERY     broken ankle  . MEDIAL PARTIAL KNEE REPLACEMENT Left    done at [place] Right 12/28/2014   Procedure: REVERSE SHOULDER ARTHROPLASTY;  Surgeon: Nita Sells, MD;  Location: [facility];  Service: Orthopedics;  Laterality: Right;  Right reverse total shoulder arthroplasty  . REVERSE TOTAL SHOULDER ARTHROPLASTY Right 12/28/2014   DR CHANDLER  . rotator cufff    . THYROIDECTOMY, PARTIAL     one lobe removed  . VARICOSE VEIN SURGERY Right     Allergies  Allergen Reactions  . Nsaids Other (See Comments)     BRUISES EASILY Other Reaction: BRUISES EASILY Other Reaction: BRUISES EASILY   . Oxycodone-Acetaminophen Other (See Comments)     VERY CONSTIPATING, does not provide pain relief  . Aspirin Other (See Comments)    Bruising   . Augmentin [Amoxicillin-Pot Clavulanate] Other (See Comments)    headache  . Ciprocin-Fluocin-Procin [Fluocinolone]   . Ciprofloxacin Hcl     tendonitis  . Fentanyl Other (See Comments)    Does not work  . Fluclorolone Other (See Comments)  . Lyrica [Pregabalin] Other (See Comments)    dysphoria  . Oxycodone Other (See Comments)    Does not work  . Versed [Midazolam] Other (See Comments)    Does not work  . Diclofenac Sodium Other (See Comments) and Rash    Skin peeling. Voltaren gel-rash and skin peeling   . Hydromorphone Hcl Itching     Itching with large doses - able to tolerate lower doses or shorter duration  . Voltaren Gel [Diclofenac Sodium] Rash and Other (See Comments)    Skin peeling.    Social History   Socioeconomic History  . Marital status: Married    Spouse name: Not on file  . Number of children: Not on file  . Years of education: Not on file  . Highest education level: Not on file  Occupational History  . Occupation: retired    Comment: Therapist, sports  Tobacco Use  . Smoking status: Never Smoker  . Smokeless tobacco: Never Used  Substance and Sexual Activity  . Alcohol use: Yes    Comment: rarely  . Drug use: No  . Sexual activity: Not on file  Other Topics Concern  . Not on file   Social History Narrative   Married   [place] 2   Lives in 1 story ranch in [place] and also has mountain home in [place] with stairs   Social Determinants of Health   Financial Resource Strain: Low Risk   . Difficulty of Paying Living Expenses: Not very hard  Food Insecurity: No Food Insecurity  . Worried About Charity fundraiser in the  Last Year: Never true  . Ran Out of Food in the Last Year: Never true  Transportation Needs: No Transportation Needs  . Lack of Transportation (Medical): No  . Lack of Transportation (Non-Medical): No  Physical Activity: Insufficiently Active  . Days of Exercise per Week: 4 days  . Minutes of Exercise per Session: 30 min  Stress: Stress Concern Present  . Feeling of Stress : Rather much  Social Connections: Not Isolated  . Frequency of Communication with Friends and Family: More than three times a week  . Frequency of Social Gatherings with Friends and Family: Twice a week  . Attends Religious Services: More than 4 times per year  . Active Member of Clubs or Organizations: Yes  . Attends Archivist Meetings: More than 4 times per year  . Marital Status: Married  Human resources officer Violence:   . Fear of Current or Ex-Partner:   . Emotionally Abused:   Marland Kitchen Physically Abused:   . Sexually Abused:     Family History  Problem Relation Age of Onset  . Heart disease Mother   . Atrial fibrillation Mother   . Cancer - Other Father         Objective:  Physical Exam: BP (!) 148/69   Ht 5\' 1"  (1.549 m)   Wt 155 lb (70.3 kg)   BMI 29.29 kg/m  Gen: NAD, comfortable in exam room Lungs: Breathing comfortably on room air Ankle/Foot Exam Left -Inspection: Significant flattening of both longitudinal and transverse arches.  Patient has hammertoes of her second and third toes.  Patient and calcaneal valgus.  She has a surgical incision just distal to the medial malleolus with mild swelling overlying this area. -Palpation: Tenderness over  surgical incision just distal to the medial malleolus -ROM: Normal ROM with dorsiflexion, plantarflexion, inversion, eversion -Strength: Dorsiflexion: 5/5; Plantarflexion: 5/5; Inversion: 5/5; Eversion: 5/5 -Limb neurovascularly intact, no instability noted  Right -Inspection: Significant flattening of longitudinal and transverse arches with hammertoes of the second and third toes. -Palpation: No tenderness palpation -ROM: Normal ROM with dorsiflexion, plantarflexion, inversion, eversion -Strength: Dorsiflexion: 5/5; Plantarflexion: 5/5; Inversion: 5/5; Eversion: 5/5 -Limb neurovascularly intact, no instability noted   Limited diagnostic ultrasound of the left foot Findings: -Fluid noted within the posterior tibial tendon sheath -Avulsion fracture seen at the attachment of the posterior tibial tendon to the navicular -No bony abnormality seen at the medial malleolus Impression: -Ultrasound findings consistent with an avulsion fracture from the attachment of the posterior tibial tendon to the navicular     Assessment & Plan:  Patient is a 79 y.o. female here for evaluation of left foot pain  1.  Posterior tibial tendon avulsion fracture of the left foot -Ultrasound showing small avulsion fracture off the navicular at the attachment from the posterior tibial tendon -Patient given an ankle compression sleeve to help with stability and swelling -In the future will consider custom orthotics for additional foot support -Patient will take over-the-counter anti-inflammatories as needed for pain  Patient will follow up in 4 to 6 weeks as needed  I observed and examined the patient with Dr. Sheppard Coil and agree with assessment and plan.  Note reviewed and modified by me. Ila Mcgill, MD

## 2020-02-23 NOTE — Patient Instructions (Signed)
The pain in your foot is caused by a small avulsion fracture off of your navicular. -Wear the ankle compression sleeve given to you.  This will help with the swelling and should help with stability of the ankle -In future may consider adding custom orthotics to right you additional foot support  We will see back in 4 to 6 weeks if this does not help improve your pain

## 2020-02-27 ENCOUNTER — Other Ambulatory Visit: Payer: Self-pay

## 2020-02-27 ENCOUNTER — Ambulatory Visit: Payer: Medicare Other | Attending: Radiation Oncology

## 2020-02-27 DIAGNOSIS — I890 Lymphedema, not elsewhere classified: Secondary | ICD-10-CM | POA: Diagnosis present

## 2020-02-27 DIAGNOSIS — M6281 Muscle weakness (generalized): Secondary | ICD-10-CM | POA: Diagnosis present

## 2020-02-27 DIAGNOSIS — R2689 Other abnormalities of gait and mobility: Secondary | ICD-10-CM | POA: Diagnosis present

## 2020-02-27 NOTE — Patient Instructions (Signed)
Deep Effective Breath   Standing, sitting, or laying down place both hands on the belly. Take a deep breath IN, expanding the belly; then breath OUT, contracting the belly. Repeat __5__ times. Do __2-3__ sessions per day and before each self massage.  http://gt2.exer.us/866   Copyright  VHI. All rights reserved.  Inguinal Nodes to Axilla - Clear   On involved side, at armpit, make _5__ in-place circles. Then from hip proceed in sections to armpit with stationary circles or pumps _5_ times, this is your pathway. Do _1__ time per day.  Copyright  VHI. All rights reserved.  LEG: Knee to Hip - Clear   Pump up outer thigh of involved leg from knee to outer hip. Then do stationary circles from inner to outer thigh, then do outer thigh again. Next, interlace fingers behind knee IF ABLE and make in-place circles. Do _5_ times of each sequence.  Do _1__ time per day.  Copyright  VHI. All rights reserved.  LEG: Ankle to Hip Sweep   Hands on sides of ankle of involved leg, pump _5__ times up both sides of lower leg, then retrace steps up outer thigh to hip as before and back to pathway. Do _2-3_ times. Do __1_ time per day.  Copyright  VHI. All rights reserved.  FOOT: Dorsum of Foot and Toes Massage   One hand on top of foot make _5_ stationary circles or pumps, then either on top of toes or each individual toe do _5_ pumps. Then retrace all steps pumping back up both sides of lower leg, outer thigh, and then pathway. Finish with what you started with, _5_ circles at involved side arm pit. All _2-3_ times at each sequence. Do _1__ time per day.

## 2020-02-27 NOTE — Therapy (Signed)
[place], [place], [address] Phone: [phone]   Fax:  [phone]  Physical Therapy Treatment  Patient Details  Name: Destiny [name] MRN: [id number] Date of Birth: 10/28/1941 Referring Provider (PT): Dr. Gery Pray   Encounter Date: 02/27/2020  PT End of Session - 02/27/20 1105    Visit Number  3    Number of Visits  13    Date for PT Re-Evaluation  03/15/20    PT Start Time  1005    PT Stop Time  1105    PT Time Calculation (min)  60 min    Activity Tolerance  Patient tolerated treatment well    Behavior During Therapy  [facility] for tasks assessed/performed       Past Medical History:  Diagnosis Date  . Anxiety   . Arthritis   . Colon polyps   . Complication of anesthesia    BP drops with fentyl and versed  . Hepatitis    hep A  1976  . Hypertension   . Hypothyroidism   . Liposarcoma of right thigh ([place]) 03/2019    Past Surgical History:  Procedure Laterality Date  . ABDOMINAL HYSTERECTOMY    . APPENDECTOMY    . BACK SURGERY      fusion  . CESAREAN SECTION     times 2  . EYE SURGERY Left    cataract removed  . FINGER ARTHRODESIS Left 10/02/2017   Procedure: LEFT LONG FINGER ARTHRODESIS;  Surgeon: Milly Jakob, MD;  Location: [facility];  Service: Orthopedics;  Laterality: Left;  . FOOT SURGERY Bilateral   . FRACTURE SURGERY     broken ankle  . MEDIAL PARTIAL KNEE REPLACEMENT Left    done at [place] Right 12/28/2014   Procedure: REVERSE SHOULDER ARTHROPLASTY;  Surgeon: Nita Sells, MD;  Location: [facility];  Service: Orthopedics;  Laterality: Right;  Right reverse total shoulder arthroplasty  . REVERSE TOTAL SHOULDER ARTHROPLASTY Right 12/28/2014   DR CHANDLER  . rotator cufff    . THYROIDECTOMY, PARTIAL     one lobe removed  . VARICOSE VEIN SURGERY Right     There were no vitals filed for this visit.  Subjective  Assessment - 02/27/20 1033    Subjective  I tried rewrapping a few times this weekend and I really hate them. I want the velcro garments. But my leg is some smaller and back of my thigh softer. I saw a foot doctor since I was here last and I have an avulsion fracture in my Lt foot that the doctor said I'll just have to deal with and wear foot support in my shoe.    Pertinent History  Liposarcoma of the Rt thigh with sartorious removed 11.5 cm intramuscular mass. Radical resection performed 04/15/19. Finished radiation 08/12/2019    Patient Stated Goals  I want to get this swelling down.    Currently in Pain?  No/denies                  Outpatient Rehab [facility]16/[facility]  Lymphedema Life Impact Scale Total Score  25 %           [facility] Adult PT Treatment/Exercise - 02/27/20 0001      Self-Care   Other Self-Care Comments   Pt came in today asking similar questions from last session about efficacy of compression bandaging, leading to pt wanting to be measured for  velcro compression garment, so scheduled her for this. Also, at pts request, reviewed why it's best to treat lymphedema now, as it is chronic and progresive in nature if left untreated, and can become a larger problem that it is now, especially with summer/hot weather just a months away. Pt seemed to be able to verbalize understanding of all, though does seem frustrated by process, as can be expected, especially as pt also deals with pain from RA .       Manual Therapy   Manual Therapy  Manual Lymphatic Drainage (MLD)    Manual Lymphatic Drainage (MLD)  In Supine: Short neck, 5 diaphragmatic breaths, Rt axillary nodes, Rt inguino-axillary anastomosis, then Rt LE working from lateral upper thigh to dorsal foot working from proximal to distal retracing all steps and instructing pt throughout.    Compression Bandaging  Biotone cream to Rt LE, then thick stockinette (size med from  foot-knee, then size lrg to thigh); Artiflex x1 from foot to knee, 1-6 cm lightly applied to foot and lower leg with 1/4" gray foam over achilles, 1-10 from ankle to knee, then artiflex x1 and 2-12 cm from foot to mid thigh.             PT Education - 02/27/20 1303    Education Details  Rt LE MLD    Person(s) Educated  Patient    Methods  Explanation;Demonstration;Handout;Tactile cues    Comprehension  Verbalized understanding;Need further instruction       PT Short Term Goals - 02/16/20 1140      PT SHORT TERM GOAL #1   Title  Pt and spouse will be independent with self MLD and HEP for the R hip within 2 weeks in order to demonstrate autonomy of care.    Baseline  pt does not perform exercises for the R hip, she walks and does not know how to perform MLD    Time  2    Period  Weeks    Status  New    Target Date  03/08/20        PT Long Term Goals - 02/16/20 1141      PT LONG TERM GOAL #1   Title  Pt will have 2 cm reduction in the R LE from the calf to the proximal thigh within 4 weeks in order to decrease risk for infection due to fluid.    Baseline  see measurements.    Time  4    Period  Weeks    Status  New    Target Date  03/22/20      PT LONG TERM GOAL #2   Title  Pt will have thigh high compression garment to wear on a daily basis wtihin 4 weeks in order to manage lymphedema at home.    Baseline  pt has a knee high compression garment.    Time  4    Period  Weeks    Status  New    Target Date  03/22/20      PT LONG TERM GOAL #3   Title  Pt will demonstrate 4/5 strength in the R hip flexors in order to decrease risk for immobility and injury.    Baseline  3-/5 strength R hip flexion    Time  4    Period  Weeks    Status  New    Target Date  03/22/20            Plan - 02/27/20 1249  Clinical Impression Statement  Pt returns to therapy reporting she rewrapped herself about 4 or 5 times over the weekend but that this was not going to work for her  short term as this was very time consuming, though she does report noticing good circumferential reductions of Rt LE and softening and distal thigh where most fullness from surgical site. Pt would like to be measured for velcro compression with [facility] from [facility], so she has an appt Friday, 03/02/20 at 10:30. After answering pts questions and further discussing her care, had time for brief MLD, instructing pt throughout to [place] before reapplyin compression bandages at pts request. She reports bandages being comfortable before leaving with no pain reported.    Personal Factors and Comorbidities  Age;Fitness;Behavior Pattern;Comorbidity 3+;Social Background    Comorbidities  surgical resection, radiation, OA causing knee pain, priorities    Stability/Clinical Decision Making  Stable/Uncomplicated    Rehab Potential  Fair    PT Frequency  3x / week   depending on pt, may be less   PT Duration  4 weeks    PT Treatment/Interventions  Therapeutic exercise;Therapeutic activities;Neuromuscular re-education;Manual techniques    PT Next Visit Plan  Cont wrapping until velcro garment arrives (pt to be measured Friday, 03/02/20); cont and review manual lymph drainage to Rt LE; Pt may bring spouse to learn either bandaging or MLD; easy hip strengthening exercises    PT Home Exercise Plan  Cont compression bandaging at home until velcro compression can be ordered    Consulted and Agree with Plan of Care  Patient       Patient will benefit from skilled therapeutic intervention in order to improve the following deficits and impairments:  Decreased strength, Increased edema  Visit Diagnosis: Lymphedema  Muscle weakness (generalized)  Other abnormalities of gait and mobility     Problem List Patient Active Problem List   Diagnosis Date Noted  . Essential tremor 10/05/2019  . Near syncope 04/28/2019  . Adjustment disorder with mixed anxiety and depressed mood 04/28/2019  . Liposarcoma of right thigh  ([place]) 04/28/2019  . Osteoarthritis of right knee 03/10/2019  . Hip pain, bilateral 01/07/2018  . Sinus tarsi syndrome 08/20/2016  . Right knee pain 07/17/2016  . S/p reverse total shoulder arthroplasty 12/28/2014  . Abnormality of gait 04/25/2014  . Foot pain 06/03/2011  . Metatarsalgia 06/03/2011  . Chronic pain syndrome 12/05/2009  . Chronic insomnia 08/15/2009  . INGROWN TOENAIL 06/06/2009  . LEG PAIN, BILATERAL 06/05/2009  . OTHER VITAMIN B12 DEFICIENCY ANEMIA 04/04/2009  . MALAISE AND FATIGUE 02/28/2009  . Hypothyroidism 07/26/2008  . HYPERLIPIDEMIA 11/05/2007  . NEUROPATHY, IDIOPATHIC PERIPHERAL NEC 09/16/2007  . Essential hypertension 01/28/2007  . Arthritis of hand, degenerative 01/28/2007  . ROTATOR CUFF INJURY, RIGHT SHOULDER 01/28/2007  . OSTEOPENIA 01/28/2007    Otelia Limes, PTA 02/27/2020, 1:04 PM  [place], [place], [address] Phone: [phone]   Fax:  [phone]  Name: Destiny [name] MRN: [id number] Date of Birth: 04-02-1941

## 2020-03-07 ENCOUNTER — Telehealth: Payer: Self-pay

## 2020-03-07 NOTE — Telephone Encounter (Signed)
This physical therapist and Collie Siad PTA about her frequency. She was educated that she is welcome to come to physical therapy 3x/week for 4 weeks which is on her plan of care if she has decided that this is what she would like to do on 03/05/20. Pt was called today to see if she would like to schedule before appointments fill up and Pt was agreeable. She was signed up for appointments 3x/week for 4 weeks.   Tomma Rakers DPT, CLT

## 2020-03-09 ENCOUNTER — Other Ambulatory Visit: Payer: Self-pay

## 2020-03-09 ENCOUNTER — Ambulatory Visit: Payer: Medicare Other

## 2020-03-09 DIAGNOSIS — R2689 Other abnormalities of gait and mobility: Secondary | ICD-10-CM

## 2020-03-09 DIAGNOSIS — I890 Lymphedema, not elsewhere classified: Secondary | ICD-10-CM

## 2020-03-09 DIAGNOSIS — M6281 Muscle weakness (generalized): Secondary | ICD-10-CM

## 2020-03-09 NOTE — Therapy (Signed)
[place], [place], [address] Phone: [phone]   Fax:  [phone]  Physical Therapy Treatment  Patient Details  Name: Destiny [name] MRN: [id number] Date of Birth: June 10, 1941 Referring Provider (PT): Dr. Gery Pray   Encounter Date: 03/09/2020  PT End of Session - 03/09/20 1231    Visit Number  4    Number of Visits  13    Date for PT Re-Evaluation  03/15/20    PT Start Time  1005    PT Stop Time  1105    PT Time Calculation (min)  60 min    Activity Tolerance  Patient tolerated treatment well    Behavior During Therapy  [facility] for tasks assessed/performed       Past Medical History:  Diagnosis Date  . Anxiety   . Arthritis   . Colon polyps   . Complication of anesthesia    BP drops with fentyl and versed  . Hepatitis    hep A  1976  . Hypertension   . Hypothyroidism   . Liposarcoma of right thigh ([place]) 03/2019    Past Surgical History:  Procedure Laterality Date  . ABDOMINAL HYSTERECTOMY    . APPENDECTOMY    . BACK SURGERY      fusion  . CESAREAN SECTION     times 2  . EYE SURGERY Left    cataract removed  . FINGER ARTHRODESIS Left 10/02/2017   Procedure: LEFT LONG FINGER ARTHRODESIS;  Surgeon: Milly Jakob, MD;  Location: [place];  Service: Orthopedics;  Laterality: Left;  . FOOT SURGERY Bilateral   . FRACTURE SURGERY     broken ankle  . MEDIAL PARTIAL KNEE REPLACEMENT Left    done at [place] Right 12/28/2014   Procedure: REVERSE SHOULDER ARTHROPLASTY;  Surgeon: Nita Sells, MD;  Location: [place];  Service: Orthopedics;  Laterality: Right;  Right reverse total shoulder arthroplasty  . REVERSE TOTAL SHOULDER ARTHROPLASTY Right 12/28/2014   DR CHANDLER  . rotator cufff    . THYROIDECTOMY, PARTIAL     one lobe removed  . VARICOSE VEIN SURGERY Right     There were no vitals filed for this  visit.  Subjective Assessment - 03/09/20 1234    Subjective  Pt states that she has not seen much improvement with wrapping at home. She reports that it is hard for her to keep the wrap on the thigh and mostly only wears in on the lower leg.    Pertinent History  Liposarcoma of the Rt thigh with sartorious removed 11.5 cm intramuscular mass. Radical resection performed 04/15/19. Finished radiation 08/12/2019    Patient Stated Goals  I want to get this swelling down.    Currently in Pain?  No/denies    Multiple Pain Sites  No                  [facility]patient [facility]  Lymphedema Life Impact Scale Total Score  25 %           [facility] Adult PT Treatment/Exercise - 03/09/20 0001      Manual Therapy   Manual Therapy  Manual Lymphatic Drainage (MLD);Compression Bandaging    Manual Lymphatic Drainage (MLD)  In Supine: Short neck, 5 diaphragmatic breaths, Rt axillary nodes, Rt inguino-axillary anastomosis, then Rt LE working from lateral upper thigh to dorsal foot working from proximal to distal retracing all steps and  instructing pt and spouse throughout.    Compression Bandaging  thin stockinette from foot to groin, 3 artiflex, thin wrap with just 1- 6 cm at the foot and then 3 short stretch spiraling up the leg 1- 10 cm, 2 -12 cm and 3 artiflex for shaping.              PT Education - 03/09/20 1231    Education Details  Pt will get compression shorts, went over MLD and wrapping. Discussed the anatomy and physiology of the lymphatic system. Pt and spouse were educated on the reduction kit.    Person(s) Educated  Patient;Spouse    Methods  Explanation    Comprehension  Verbalized understanding       PT Short Term Goals - 02/16/20 1140      PT SHORT TERM GOAL #1   Title  Pt and spouse will be independent with self MLD and HEP for the R hip within 2 weeks in order to demonstrate autonomy of care.    Baseline  pt does  not perform exercises for the R hip, she walks and does not know how to perform MLD    Time  2    Period  Weeks    Status  New    Target Date  03/08/20        PT Long Term Goals - 02/16/20 1141      PT LONG TERM GOAL #1   Title  Pt will have 2 cm reduction in the R LE from the calf to the proximal thigh within 4 weeks in order to decrease risk for infection due to fluid.    Baseline  see measurements.    Time  4    Period  Weeks    Status  New    Target Date  03/22/20      PT LONG TERM GOAL #2   Title  Pt will have thigh high compression garment to wear on a daily basis wtihin 4 weeks in order to manage lymphedema at home.    Baseline  pt has a knee high compression garment.    Time  4    Period  Weeks    Status  New    Target Date  03/22/20      PT LONG TERM GOAL #3   Title  Pt will demonstrate 4/5 strength in the R hip flexors in order to decrease risk for immobility and injury.    Baseline  3-/5 strength R hip flexion    Time  4    Period  Weeks    Status  New    Target Date  03/22/20            Plan - 03/09/20 1227    Clinical Impression Statement  Pt presented to physical therapy today with her spouse. They have a lymphedema book that their daughter had given them with manual lymph drainage and exercises in it for lymphedema. Discussed that the exercises, MLD and compression looked adequate in the book. Pt and spouse were educated on MLD and the differences between the way that this physical therapist teaches MLD and how it is presented in the book and why. Significant education on the anatomy and physiology of the lymphatic system throughout the session for understanding. Discussed the importance of getting some compression shorts due to edema noted in the R hip that is possibly resulting in poor results with home wrapping as well as this will help pt keep thigh wrap  in place. MLD was performed with the patient in supine and then she was wrapped from the foot to the  groin in sitting then in standing with compression wrap. Pt will benefit from continued POC at this time. She has decided to come 3x/week for 4 weeks as recommended and was provided with a calendar today. Her spouse took notes during the appointment.    Personal Factors and Comorbidities  Age;Fitness;Behavior Pattern;Comorbidity 3+;Social Background    Comorbidities  surgical resection, radiation, OA causing knee pain, priorities    Rehab Potential  Fair    PT Frequency  3x / week    PT Duration  4 weeks    PT Treatment/Interventions  Therapeutic exercise;Therapeutic activities;Neuromuscular re-education;Manual techniques    PT Next Visit Plan  Cont wrapping until velcro garment arrives (pt to be measured Friday, 03/02/20); cont and review manual lymph drainage to Rt LE; Pt may bring spouse to learn either bandaging or MLD; easy hip strengthening exercises    PT Home Exercise Plan  ask how foot wrap did and adjust as necessary. Cont compression bandaging at home until velcro compression can be ordered    Consulted and Agree with Plan of Care  Patient;Family member/caregiver    Family Member Consulted  spouse       Patient will benefit from skilled therapeutic intervention in order to improve the following deficits and impairments:  Decreased strength, Increased edema  Visit Diagnosis: Lymphedema  Muscle weakness (generalized)  Other abnormalities of gait and mobility     Problem List Patient Active Problem List   Diagnosis Date Noted  . Essential tremor 10/05/2019  . Near syncope 04/28/2019  . Adjustment disorder with mixed anxiety and depressed mood 04/28/2019  . Liposarcoma of right thigh ([place]) 04/28/2019  . Osteoarthritis of right knee 03/10/2019  . Hip pain, bilateral 01/07/2018  . Sinus tarsi syndrome 08/20/2016  . Right knee pain 07/17/2016  . S/p reverse total shoulder arthroplasty 12/28/2014  . Abnormality of gait 04/25/2014  . Foot pain 06/03/2011  . Metatarsalgia  06/03/2011  . Chronic pain syndrome 12/05/2009  . Chronic insomnia 08/15/2009  . INGROWN TOENAIL 06/06/2009  . LEG PAIN, BILATERAL 06/05/2009  . OTHER VITAMIN B12 DEFICIENCY ANEMIA 04/04/2009  . MALAISE AND FATIGUE 02/28/2009  . Hypothyroidism 07/26/2008  . HYPERLIPIDEMIA 11/05/2007  . NEUROPATHY, IDIOPATHIC PERIPHERAL NEC 09/16/2007  . Essential hypertension 01/28/2007  . Arthritis of hand, degenerative 01/28/2007  . ROTATOR CUFF INJURY, RIGHT SHOULDER 01/28/2007  . OSTEOPENIA 01/28/2007    Ander Purpura, PT 03/09/2020, 12:35 PM  [place], [place], [address] Phone: [phone]   Fax:  [phone]  Name: Destiny [name] MRN: [id number] Date of Birth: 28-Feb-1941

## 2020-03-12 ENCOUNTER — Other Ambulatory Visit: Payer: Self-pay

## 2020-03-12 ENCOUNTER — Ambulatory Visit: Payer: Medicare Other | Admitting: Rehabilitation

## 2020-03-12 ENCOUNTER — Encounter: Payer: Self-pay | Admitting: Rehabilitation

## 2020-03-12 DIAGNOSIS — I890 Lymphedema, not elsewhere classified: Secondary | ICD-10-CM

## 2020-03-12 DIAGNOSIS — M6281 Muscle weakness (generalized): Secondary | ICD-10-CM

## 2020-03-12 DIAGNOSIS — R2689 Other abnormalities of gait and mobility: Secondary | ICD-10-CM

## 2020-03-12 NOTE — Therapy (Signed)
[place], [place], [address] Phone: [phone]   Fax:  [phone]  Physical Therapy Treatment  Patient Details  Name: Destiny [name] MRN: [id number] Date of Birth: Aug 28, 1941 Referring Provider (PT): Dr. Gery Pray   Encounter Date: 03/12/2020  PT End of Session - 03/12/20 1126    Visit Number  5    Number of Visits  13    Date for PT Re-Evaluation  03/15/20    PT Start Time  0902    PT Stop Time  1007    PT Time Calculation (min)  65 min    Activity Tolerance  Patient tolerated treatment well    Behavior During Therapy  [facility] for tasks assessed/performed       Past Medical History:  Diagnosis Date  . Anxiety   . Arthritis   . Colon polyps   . Complication of anesthesia    BP drops with fentyl and versed  . Hepatitis    hep A  1976  . Hypertension   . Hypothyroidism   . Liposarcoma of right thigh ([place]) 03/2019    Past Surgical History:  Procedure Laterality Date  . ABDOMINAL HYSTERECTOMY    . APPENDECTOMY    . BACK SURGERY      fusion  . CESAREAN SECTION     times 2  . EYE SURGERY Left    cataract removed  . FINGER ARTHRODESIS Left 10/02/2017   Procedure: LEFT LONG FINGER ARTHRODESIS;  Surgeon: Milly Jakob, MD;  Location: [place];  Service: Orthopedics;  Laterality: Left;  . FOOT SURGERY Bilateral   . FRACTURE SURGERY     broken ankle  . MEDIAL PARTIAL KNEE REPLACEMENT Left    done at [place] Right 12/28/2014   Procedure: REVERSE SHOULDER ARTHROPLASTY;  Surgeon: Nita Sells, MD;  Location: [place];  Service: Orthopedics;  Laterality: Right;  Right reverse total shoulder arthroplasty  . REVERSE TOTAL SHOULDER ARTHROPLASTY Right 12/28/2014   DR CHANDLER  . rotator cufff    . THYROIDECTOMY, PARTIAL     one lobe removed  . VARICOSE VEIN SURGERY Right     There were no vitals filed for this  visit.  Subjective Assessment - 03/12/20 0905    Subjective  I got my new wrap in the mail. I have had this bandage on for about 24 hours.  Have not done any MLD.  I feel pretty good with the bandagin.    Pertinent History  Liposarcoma of the Rt thigh with sartorious removed 11.5 cm intramuscular mass. Radical resection performed 04/15/19. Finished radiation 08/12/2019    Currently in Pain?  No/denies            LYMPHEDEMA/ONCOLOGY QUESTIONNAIRE - 03/12/20 0916      Right Lower Extremity Lymphedema   20 cm Proximal to Suprapatella  57 cm    10 cm Proximal to Suprapatella  50 cm    At Midpatella/Popliteal Crease  41.5 cm    30 cm Proximal to Floor at Lateral Plantar Foot  40 cm    20 cm Proximal to Floor at Lateral Plantar Foot  34.3 1    10  cm Proximal to Floor at Lateral Malleoli  25 cm    5 cm Proximal to 1st MTP Joint  21.3 cm    Across MTP Joint  21.6 cm    Around Proximal Great Toe  7 cm  [facility]patient Re[facility]16/[facility]  Lymphedema Life Impact Scale Total Score  25 %           [facility] Adult PT Treatment/Exercise - 03/12/20 0001      Manual Therapy   Manual Therapy  Edema management    Edema Management  helped pt open new ready wrap lower leg, knee, and thigh pieces with education on donning, care of garment and reminder on length of shorts to cover the top of the garments.  Assessed pts self bandaging which she is doing very well.  Remeasured circumferences.  Educated pt to watch the foot and toes to see if a foot piece is needed.     Manual Lymphatic Drainage (MLD)  Brief session today In Supine: Short neck, 5 diaphragmatic breaths, Rt axillary nodes, Rt inguino-axillary anastomosis, then Rt LE working from lateral upper thigh to dorsal foot working from proximal to distal retracing all steps     Compression Bandaging  thick stockinette from foot to groin, 2 artiflex with none at the foot, thin wrap with just  1- 6 cm at the foot and then 3 short stretch spiraling up the leg 1- 10 cm, 2 -12 cm              PT Education - 03/12/20 1126    Education Details  how to donn and take care of her new ready wrap    Person(s) Educated  Patient    Methods  Explanation;Demonstration;Tactile cues;Verbal cues    Comprehension  Verbalized understanding       PT Short Term Goals - 02/16/20 1140      PT SHORT TERM GOAL #1   Title  Pt and spouse will be independent with self MLD and HEP for the R hip within 2 weeks in order to demonstrate autonomy of care.    Baseline  pt does not perform exercises for the R hip, she walks and does not know how to perform MLD    Time  2    Period  Weeks    Status  New    Target Date  03/08/20        PT Long Term Goals - 02/16/20 1141      PT LONG TERM GOAL #1   Title  Pt will have 2 cm reduction in the R LE from the calf to the proximal thigh within 4 weeks in order to decrease risk for infection due to fluid.    Baseline  see measurements.    Time  4    Period  Weeks    Status  New    Target Date  03/22/20      PT LONG TERM GOAL #2   Title  Pt will have thigh high compression garment to wear on a daily basis wtihin 4 weeks in order to manage lymphedema at home.    Baseline  pt has a knee high compression garment.    Time  4    Period  Weeks    Status  New    Target Date  03/22/20      PT LONG TERM GOAL #3   Title  Pt will demonstrate 4/5 strength in the R hip flexors in order to decrease risk for immobility and injury.    Baseline  3-/5 strength R hip flexion    Time  4    Period  Weeks    Status  New    Target Date  03/22/20  Plan - 03/12/20 1127    Clinical Impression Statement  Pt happy with receiving her compression wrap and reports success with self bandaging. Pt will be getting compression shorts after this treatment session so pt requested to leave with PT bandaging the leg.  measurements showing slight decrease or the same  overall.  No increase in foot or toe despite minimal bandaging here.    PT Frequency  3x / week    PT Duration  4 weeks    PT Treatment/Interventions  Therapeutic exercise;Therapeutic activities;Neuromuscular re-education;Manual techniques    PT Next Visit Plan  Cont wrapping with minimal foot coverage or use of ready wrap  with focus on  manual lymph drainage to Rt LE; Self MLD or pump?    Consulted and Agree with Plan of Care  Patient;Family member/caregiver       Patient will benefit from skilled therapeutic intervention in order to improve the following deficits and impairments:     Visit Diagnosis: Lymphedema  Muscle weakness (generalized)  Other abnormalities of gait and mobility     Problem List Patient Active Problem List   Diagnosis Date Noted  . Essential tremor 10/05/2019  . Near syncope 04/28/2019  . Adjustment disorder with mixed anxiety and depressed mood 04/28/2019  . Liposarcoma of right thigh ([place]) 04/28/2019  . Osteoarthritis of right knee 03/10/2019  . Hip pain, bilateral 01/07/2018  . Sinus tarsi syndrome 08/20/2016  . Right knee pain 07/17/2016  . S/p reverse total shoulder arthroplasty 12/28/2014  . Abnormality of gait 04/25/2014  . Foot pain 06/03/2011  . Metatarsalgia 06/03/2011  . Chronic pain syndrome 12/05/2009  . Chronic insomnia 08/15/2009  . INGROWN TOENAIL 06/06/2009  . LEG PAIN, BILATERAL 06/05/2009  . OTHER VITAMIN B12 DEFICIENCY ANEMIA 04/04/2009  . MALAISE AND FATIGUE 02/28/2009  . Hypothyroidism 07/26/2008  . HYPERLIPIDEMIA 11/05/2007  . NEUROPATHY, IDIOPATHIC PERIPHERAL NEC 09/16/2007  . Essential hypertension 01/28/2007  . Arthritis of hand, degenerative 01/28/2007  . ROTATOR CUFF INJURY, RIGHT SHOULDER 01/28/2007  . OSTEOPENIA 01/28/2007    Stark Bray 03/12/2020, 12:12 PM  [place], [place], [address] Phone: [phone]   Fax:   [phone]  Name: Destiny [name] MRN: [id number] Date of Birth: 26-Aug-1941

## 2020-03-14 ENCOUNTER — Other Ambulatory Visit: Payer: Self-pay

## 2020-03-14 ENCOUNTER — Encounter: Payer: Self-pay | Admitting: Rehabilitation

## 2020-03-14 ENCOUNTER — Ambulatory Visit: Payer: Medicare Other | Admitting: Rehabilitation

## 2020-03-14 DIAGNOSIS — M6281 Muscle weakness (generalized): Secondary | ICD-10-CM

## 2020-03-14 DIAGNOSIS — I890 Lymphedema, not elsewhere classified: Secondary | ICD-10-CM | POA: Diagnosis not present

## 2020-03-14 DIAGNOSIS — R2689 Other abnormalities of gait and mobility: Secondary | ICD-10-CM

## 2020-03-14 NOTE — Therapy (Signed)
[place], [place], [address] Phone: [phone]   Fax:  [phone]  Physical Therapy Treatment  Patient Details  Name: Destiny [name] MRN: [id number] Date of Birth: Nov 21, 1941 Referring Provider (PT): Dr. Gery Pray   Encounter Date: 03/14/2020  PT End of Session - 03/14/20 1230    Visit Number  6    Number of Visits  13    Date for PT Re-Evaluation  03/15/20    PT Start Time  1000    PT Stop Time  1058    PT Time Calculation (min)  58 min    Activity Tolerance  Patient tolerated treatment well    Behavior During Therapy  [facility] for tasks assessed/performed       Past Medical History:  Diagnosis Date  . Anxiety   . Arthritis   . Colon polyps   . Complication of anesthesia    BP drops with fentyl and versed  . Hepatitis    hep A  1976  . Hypertension   . Hypothyroidism   . Liposarcoma of right thigh ([place]) 03/2019    Past Surgical History:  Procedure Laterality Date  . ABDOMINAL HYSTERECTOMY    . APPENDECTOMY    . BACK SURGERY      fusion  . CESAREAN SECTION     times 2  . EYE SURGERY Left    cataract removed  . FINGER ARTHRODESIS Left 10/02/2017   Procedure: LEFT LONG FINGER ARTHRODESIS;  Surgeon: Milly Jakob, MD;  Location: [place];  Service: Orthopedics;  Laterality: Left;  . FOOT SURGERY Bilateral   . FRACTURE SURGERY     broken ankle  . MEDIAL PARTIAL KNEE REPLACEMENT Left    done at [place] Right 12/28/2014   Procedure: REVERSE SHOULDER ARTHROPLASTY;  Surgeon: Nita Sells, MD;  Location: [place];  Service: Orthopedics;  Laterality: Right;  Right reverse total shoulder arthroplasty  . REVERSE TOTAL SHOULDER ARTHROPLASTY Right 12/28/2014   DR CHANDLER  . rotator cufff    . THYROIDECTOMY, PARTIAL     one lobe removed  . VARICOSE VEIN SURGERY Right     There were no vitals filed for this  visit.  Subjective Assessment - 03/14/20 1000    Subjective  I did not find any shorts but ordered some online    Pertinent History  Liposarcoma of the Rt thigh with sartorious removed 11.5 cm intramuscular mass. Radical resection performed 04/15/19. Finished radiation 08/12/2019    Patient Stated Goals  I want to get this swelling down.    Currently in Pain?  No/denies                  [facility]  Lymphedema Life Impact Scale Total Score  25 %           [facility] Adult PT Treatment/Exercise - 03/14/20 0001      Manual Therapy   Manual Lymphatic Drainage (MLD)  Initial focus on teaching pt self MLd with focus on easy positioning and technique.  Pt wanted to use her lymphedema book as instructions so PT made notes in the book and included reps and deep breathing.  Pt needing multiple cues to decrease skin sliding and squeezing. Then in supine PT performend: Supine: Short neck, 5 diaphragmatic breaths, Rt axillary nodes, Rt inguino-axillary anastomosis, then Rt LE working from lateral upper thigh to dorsal foot working from proximal  to distal retracing all steps     Compression Bandaging  helped pt donn ready wrap             PT Education - 03/14/20 1230    Education Details  self MLD    Person(s) Educated  Patient    Methods  Explanation;Demonstration;Tactile cues;Verbal cues;Handout    Comprehension  Verbalized understanding;Returned demonstration;Verbal cues required;Tactile cues required;Need further instruction       PT Short Term Goals - 02/16/20 1140      PT SHORT TERM GOAL #1   Title  Pt and spouse will be independent with self MLD and HEP for the R hip within 2 weeks in order to demonstrate autonomy of care.    Baseline  pt does not perform exercises for the R hip, she walks and does not know how to perform MLD    Time  2    Period  Weeks    Status  New    Target Date  03/08/20         PT Long Term Goals - 02/16/20 1141      PT LONG TERM GOAL #1   Title  Pt will have 2 cm reduction in the R LE from the calf to the proximal thigh within 4 weeks in order to decrease risk for infection due to fluid.    Baseline  see measurements.    Time  4    Period  Weeks    Status  New    Target Date  03/22/20      PT LONG TERM GOAL #2   Title  Pt will have thigh high compression garment to wear on a daily basis wtihin 4 weeks in order to manage lymphedema at home.    Baseline  pt has a knee high compression garment.    Time  4    Period  Weeks    Status  New    Target Date  03/22/20      PT LONG TERM GOAL #3   Title  Pt will demonstrate 4/5 strength in the R hip flexors in order to decrease risk for immobility and injury.    Baseline  3-/5 strength R hip flexion    Time  4    Period  Weeks    Status  New    Target Date  03/22/20            Plan - 03/14/20 1231    Clinical Impression Statement  Pt liking her compression wrap and is able to donn at home.  Focused on teaching self MLD for the Rt LE today with pt having difficulty reaching the lower lower leg and ankle so educated pt that her husband can help or to just do a low as she is able.  Skin on the lower leg feels more extensible today which is improved since last visit.    PT Frequency  3x / week    PT Duration  4 weeks    PT Treatment/Interventions  Therapeutic exercise;Therapeutic activities;Neuromuscular re-education;Manual techniques    PT Next Visit Plan  Cont wrapping with minimal foot coverage or use of ready wrap  with focus on  manual lymph drainage to Rt LE; Self MLD questions?    Consulted and Agree with Plan of Care  Patient;Family member/caregiver       Patient will benefit from skilled therapeutic intervention in order to improve the following deficits and impairments:     Visit Diagnosis: Lymphedema  Muscle  weakness (generalized)  Other abnormalities of gait and  mobility     Problem List Patient Active Problem List   Diagnosis Date Noted  . Essential tremor 10/05/2019  . Near syncope 04/28/2019  . Adjustment disorder with mixed anxiety and depressed mood 04/28/2019  . Liposarcoma of right thigh ([place]) 04/28/2019  . Osteoarthritis of right knee 03/10/2019  . Hip pain, bilateral 01/07/2018  . Sinus tarsi syndrome 08/20/2016  . Right knee pain 07/17/2016  . S/p reverse total shoulder arthroplasty 12/28/2014  . Abnormality of gait 04/25/2014  . Foot pain 06/03/2011  . Metatarsalgia 06/03/2011  . Chronic pain syndrome 12/05/2009  . Chronic insomnia 08/15/2009  . INGROWN TOENAIL 06/06/2009  . LEG PAIN, BILATERAL 06/05/2009  . OTHER VITAMIN B12 DEFICIENCY ANEMIA 04/04/2009  . MALAISE AND FATIGUE 02/28/2009  . Hypothyroidism 07/26/2008  . HYPERLIPIDEMIA 11/05/2007  . NEUROPATHY, IDIOPATHIC PERIPHERAL NEC 09/16/2007  . Essential hypertension 01/28/2007  . Arthritis of hand, degenerative 01/28/2007  . ROTATOR CUFF INJURY, RIGHT SHOULDER 01/28/2007  . OSTEOPENIA 01/28/2007    Stark Bray 03/14/2020, 12:33 PM  [place], [place], [address] Phone: [phone]   Fax:  [phone]  Name: Lisel Spiva MRN: [id number] Date of Birth: Jul 22, 1941

## 2020-03-16 ENCOUNTER — Ambulatory Visit: Payer: Medicare Other | Admitting: Rehabilitation

## 2020-03-19 ENCOUNTER — Ambulatory Visit: Payer: Medicare Other | Admitting: Rehabilitation

## 2020-03-19 ENCOUNTER — Encounter: Payer: Self-pay | Admitting: Rehabilitation

## 2020-03-19 ENCOUNTER — Other Ambulatory Visit: Payer: Self-pay

## 2020-03-19 DIAGNOSIS — I890 Lymphedema, not elsewhere classified: Secondary | ICD-10-CM

## 2020-03-19 DIAGNOSIS — R2689 Other abnormalities of gait and mobility: Secondary | ICD-10-CM

## 2020-03-19 DIAGNOSIS — M6281 Muscle weakness (generalized): Secondary | ICD-10-CM

## 2020-03-19 NOTE — Therapy (Signed)
[place], [place], [address] Phone: [phone]   Fax:  [phone]  Physical Therapy Treatment  Patient Details  Name: Destiny [name] MRN: [id number] Date of Birth: [birth year] Referring Provider (PT): Dr. Gery Pray   Encounter Date: 03/19/2020  PT End of Session - 03/19/20 1001    Visit Number  7    Number of Visits  13    Date for PT Re-Evaluation  03/15/20    PT Start Time  0900    PT Stop Time  1000    PT Time Calculation (min)  60 min    Activity Tolerance  Patient tolerated treatment well    Behavior During Therapy  [facility] for tasks assessed/performed       Past Medical History:  Diagnosis Date  . Anxiety   . Arthritis   . Colon polyps   . Complication of anesthesia    BP drops with fentyl and versed  . Hepatitis    hep A  1976  . Hypertension   . Hypothyroidism   . Liposarcoma of right thigh ([place]) 03/2019    Past Surgical History:  Procedure Laterality Date  . ABDOMINAL HYSTERECTOMY    . APPENDECTOMY    . BACK SURGERY      fusion  . CESAREAN SECTION     times 2  . EYE SURGERY Left    cataract removed  . FINGER ARTHRODESIS Left 10/02/2017   Procedure: LEFT LONG FINGER ARTHRODESIS;  Surgeon: Milly Jakob, MD;  Location: [place];  Service: Orthopedics;  Laterality: Left;  . FOOT SURGERY Bilateral   . FRACTURE SURGERY     broken ankle  . MEDIAL PARTIAL KNEE REPLACEMENT Left    done at [place] Right 12/28/2014   Procedure: REVERSE SHOULDER ARTHROPLASTY;  Surgeon: Nita Sells, MD;  Location: [place];  Service: Orthopedics;  Laterality: Right;  Right reverse total shoulder arthroplasty  . REVERSE TOTAL SHOULDER ARTHROPLASTY Right 12/28/2014   DR CHANDLER  . rotator cufff    . THYROIDECTOMY, PARTIAL     one lobe removed  . VARICOSE VEIN SURGERY Right     There were no vitals filed for this  visit.  Subjective Assessment - 03/19/20 0900    Subjective  I got my shorts which are okay but I think I will get one size smaller    Pertinent History  Liposarcoma of the Rt thigh with sartorious removed 11.5 cm intramuscular mass. Radical resection performed 04/15/19. Finished radiation 08/12/2019    Currently in Pain?  No/denies            LYMPHEDEMA/ONCOLOGY QUESTIONNAIRE - 03/19/20 0907      Right Lower Extremity Lymphedema   20 cm Proximal to Suprapatella  50.3 cm    10 cm Proximal to Suprapatella  41.5 cm    At Midpatella/Popliteal Crease  40.3 cm    30 cm Proximal to Floor at Lateral Plantar Foot  38.8 cm    20 cm Proximal to Floor at Lateral Plantar Foot  31._0 cm Proximal to Floor at Lateral Malleoli  25.2 cm    5 cm Proximal to 1st MTP Joint  21.5 cm    Across MTP Joint  21.5 cm    Around Proximal Great Toe  7.2 cm           Outpatient Rehab from 02/16/2020 in [place]  Lymphedema Life Impact  Scale Total Score  25 %           [facility] Adult PT Treatment/Exercise - 03/19/20 0001      Manual Therapy   Edema Management  remeasured LE    Manual Lymphatic Drainage (MLD)  Supine: Short neck, superficial abdominals, 5 diaphragmatic breaths, Rt axillary nodes, Rt inguino-axillary anastomosis, then Rt LE working from lateral upper thigh to dorsal foot working from proximal to distal retracing all steps     Compression Bandaging  helped pt donn ready wrap               PT Short Term Goals - 02/16/20 1140      PT SHORT TERM GOAL #1   Title  Pt and spouse will be independent with self MLD and HEP for the R hip within 2 weeks in order to demonstrate autonomy of care.    Baseline  pt does not perform exercises for the R hip, she walks and does not know how to perform MLD    Time  2    Period  Weeks    Status  New    Target Date  03/08/20        PT Long Term Goals - 03/19/20 1003      PT LONG TERM GOAL #1    Title  Pt will have 2 cm reduction in the R LE from the calf to the proximal thigh within 4 weeks in order to decrease risk for infection due to fluid.    Status  Partially Met      PT LONG TERM GOAL #2   Title  Pt will have thigh high compression garment to wear on a daily basis wtihin 4 weeks in order to manage lymphedema at home.    Status  Achieved      PT LONG TERM GOAL #3   Title  Pt will demonstrate 4/5 strength in the R hip flexors in order to decrease risk for immobility and injury.    Status  On-going            Plan - 03/19/20 1001    Clinical Impression Statement  Pt has made excellent progress with self care using ready wrap and self MLD. Thigh has decreased by almost 5-7cm and the lower leg around 1cm but more importantly improving skin extensibility.  Pt would like to decrease to 2x per week due to doing well now.  Pt now has compression shorts and would like to be measured for a thigh high stocking when able.    PT Frequency  2x / week    PT Duration  4 weeks    PT Treatment/Interventions  Therapeutic exercise;Therapeutic activities;Neuromuscular re-education;Manual techniques    PT Next Visit Plan  Cont wrapping with minimal foot coverage or use of ready wrap  with focus on  manual lymph drainage to Rt LE; Self MLD questions?    Consulted and Agree with Plan of Care  Patient       Patient will benefit from skilled therapeutic intervention in order to improve the following deficits and impairments:     Visit Diagnosis: Lymphedema  Muscle weakness (generalized)  Other abnormalities of gait and mobility     Problem List Patient Active Problem List   Diagnosis Date Noted  . Essential tremor 10/05/2019  . Near syncope 04/28/2019  . Adjustment disorder with mixed anxiety and depressed mood 04/28/2019  . Liposarcoma of right thigh ([place]) 04/28/2019  . Osteoarthritis of right knee 03/10/2019  .  Hip pain, bilateral 01/07/2018  . Sinus tarsi syndrome 08/20/2016   . Right knee pain 07/17/2016  . S/p reverse total shoulder arthroplasty 12/28/2014  . Abnormality of gait 04/25/2014  . Foot pain 06/03/2011  . Metatarsalgia 06/03/2011  . Chronic pain syndrome 12/05/2009  . Chronic insomnia 08/15/2009  . INGROWN TOENAIL 06/06/2009  . LEG PAIN, BILATERAL 06/05/2009  . OTHER VITAMIN B12 DEFICIENCY ANEMIA 04/04/2009  . MALAISE AND FATIGUE 02/28/2009  . Hypothyroidism 07/26/2008  . HYPERLIPIDEMIA 11/05/2007  . NEUROPATHY, IDIOPATHIC PERIPHERAL NEC 09/16/2007  . Essential hypertension 01/28/2007  . Arthritis of hand, degenerative 01/28/2007  . ROTATOR CUFF INJURY, RIGHT SHOULDER 01/28/2007  . OSTEOPENIA 01/28/2007    Stark Bray 03/19/2020, 10:04 AM  [place], [place], [address] Phone: [phone]   Fax:  [phone]  Name: Winslow Verrill MRN: [id number] Date of Birth: [birth year]

## 2020-03-21 ENCOUNTER — Other Ambulatory Visit: Payer: Self-pay

## 2020-03-21 ENCOUNTER — Ambulatory Visit: Payer: Medicare Other | Admitting: Rehabilitation

## 2020-03-21 ENCOUNTER — Encounter: Payer: Self-pay | Admitting: Rehabilitation

## 2020-03-21 DIAGNOSIS — I890 Lymphedema, not elsewhere classified: Secondary | ICD-10-CM

## 2020-03-21 DIAGNOSIS — M6281 Muscle weakness (generalized): Secondary | ICD-10-CM

## 2020-03-21 DIAGNOSIS — R2689 Other abnormalities of gait and mobility: Secondary | ICD-10-CM

## 2020-03-21 NOTE — Therapy (Signed)
[place], [place], [address] Phone: [phone]   Fax:  [phone]  Physical Therapy Treatment  Patient Details  Name: Destiny [name] MRN: [id number] Date of Birth: 11/09/1941 Referring Provider (PT): Dr. Gery Pray   Encounter Date: 03/21/2020  PT End of Session - 03/21/20 1001    Visit Number  8    Number of Visits  13    Date for PT Re-Evaluation  03/15/20    PT Start Time  0902    PT Stop Time  1002    PT Time Calculation (min)  60 min    Activity Tolerance  Patient tolerated treatment well    Behavior During Therapy  [facility] for tasks assessed/performed       Past Medical History:  Diagnosis Date  . Anxiety   . Arthritis   . Colon polyps   . Complication of anesthesia    BP drops with fentyl and versed  . Hepatitis    hep A  1976  . Hypertension   . Hypothyroidism   . Liposarcoma of right thigh ([place]) 03/2019    Past Surgical History:  Procedure Laterality Date  . ABDOMINAL HYSTERECTOMY    . APPENDECTOMY    . BACK SURGERY      fusion  . CESAREAN SECTION     times 2  . EYE SURGERY Left    cataract removed  . FINGER ARTHRODESIS Left 10/02/2017   Procedure: LEFT LONG FINGER ARTHRODESIS;  Surgeon: Milly Jakob, MD;  Location: [place];  Service: Orthopedics;  Laterality: Left;  . FOOT SURGERY Bilateral   . FRACTURE SURGERY     broken ankle  . MEDIAL PARTIAL KNEE REPLACEMENT Left    done at [place] Right 12/28/2014   Procedure: REVERSE SHOULDER ARTHROPLASTY;  Surgeon: Nita Sells, MD;  Location: [place];  Service: Orthopedics;  Laterality: Right;  Right reverse total shoulder arthroplasty  . REVERSE TOTAL SHOULDER ARTHROPLASTY Right 12/28/2014   DR CHANDLER  . rotator cufff    . THYROIDECTOMY, PARTIAL     one lobe removed  . VARICOSE VEIN SURGERY Right     There were no vitals filed for this  visit.  Subjective Assessment - 03/21/20 0906    Subjective  This stocking really bothers my toe due to the arthritis    Pertinent History  Liposarcoma of the Rt thigh with sartorious removed 11.5 cm intramuscular mass. Radical resection performed 04/15/19. Finished radiation 08/12/2019    Patient Stated Goals  I want to get this swelling down.    Currently in Pain?  No/denies                  [facility]  Lymphedema Life Impact Scale Total Score  25 %           [facility] Adult PT Treatment/Exercise - 03/21/20 0001      Manual Therapy   Edema Management  scheduled pt with Melissa for compression garment measuring; gave pt ordering information on tg soft and tg tubular for liners as she prefers these to closed toe    Manual Lymphatic Drainage (MLD)  Supine: Short neck, superficial abdominals, 5 diaphragmatic breaths, Rt axillary nodes, Rt inguino-axillary anastomosis, Bil inguinal nodes, interinguinal anastamoses, then Rt LE working from lateral upper thigh to dorsal foot working from proximal to distal retracing all steps     Compression Bandaging  helped  pt donn ready wrap               PT Short Term Goals - 02/16/20 1140      PT SHORT TERM GOAL #1   Title  Pt and spouse will be independent with self MLD and HEP for the R hip within 2 weeks in order to demonstrate autonomy of care.    Baseline  pt does not perform exercises for the R hip, she walks and does not know how to perform MLD    Time  2    Period  Weeks    Status  New    Target Date  03/08/20        PT Long Term Goals - 03/19/20 1003      PT LONG TERM GOAL #1   Title  Pt will have 2 cm reduction in the R LE from the calf to the proximal thigh within 4 weeks in order to decrease risk for infection due to fluid.    Status  Partially Met      PT LONG TERM GOAL #2   Title  Pt will have thigh high compression garment to wear on a  daily basis wtihin 4 weeks in order to manage lymphedema at home.    Status  Achieved      PT LONG TERM GOAL #3   Title  Pt will demonstrate 4/5 strength in the R hip flexors in order to decrease risk for immobility and injury.    Status  On-going            Plan - 03/21/20 1001    Clinical Impression Statement  Pts LE continues to have good reduction with use of ready wrap and self MLD.  Pt would like to go ahead with open toe thigh high compression stocking so Melissa will measure her next Friday.  Medial thigh incision improving skin mobility still with around 2 inches distally with more tightness.    PT Frequency  2x / week    PT Duration  4 weeks    PT Treatment/Interventions  Therapeutic exercise;Therapeutic activities;Neuromuscular re-education;Manual techniques    PT Next Visit Plan  Cont wrapping with minimal foot coverage or use of ready wrap  with focus on  manual lymph drainage to Rt LE; Self MLD questions?       Patient will benefit from skilled therapeutic intervention in order to improve the following deficits and impairments:     Visit Diagnosis: Lymphedema  Muscle weakness (generalized)  Other abnormalities of gait and mobility     Problem List Patient Active Problem List   Diagnosis Date Noted  . Essential tremor 10/05/2019  . Near syncope 04/28/2019  . Adjustment disorder with mixed anxiety and depressed mood 04/28/2019  . Liposarcoma of right thigh ([place]) 04/28/2019  . Osteoarthritis of right knee 03/10/2019  . Hip pain, bilateral 01/07/2018  . Sinus tarsi syndrome 08/20/2016  . Right knee pain 07/17/2016  . S/p reverse total shoulder arthroplasty 12/28/2014  . Abnormality of gait 04/25/2014  . Foot pain 06/03/2011  . Metatarsalgia 06/03/2011  . Chronic pain syndrome 12/05/2009  . Chronic insomnia 08/15/2009  . INGROWN TOENAIL 06/06/2009  . LEG PAIN, BILATERAL 06/05/2009  . OTHER VITAMIN B12 DEFICIENCY ANEMIA 04/04/2009  . MALAISE AND FATIGUE  02/28/2009  . Hypothyroidism 07/26/2008  . HYPERLIPIDEMIA 11/05/2007  . NEUROPATHY, IDIOPATHIC PERIPHERAL NEC 09/16/2007  . Essential hypertension 01/28/2007  . Arthritis of hand, degenerative 01/28/2007  . ROTATOR CUFF INJURY, RIGHT SHOULDER 01/28/2007  .  OSTEOPENIA 01/28/2007    Stark Bray 03/21/2020, 10:04 AM  [place], [place], [address] Phone: [phone]   Fax:  [phone]  Name: Shadee Rathod MRN: [id number] Date of Birth: Dec 28, 1940

## 2020-03-23 ENCOUNTER — Encounter: Payer: Self-pay | Admitting: Rehabilitation

## 2020-03-26 ENCOUNTER — Ambulatory Visit: Payer: Medicare Other | Attending: Radiation Oncology | Admitting: Rehabilitation

## 2020-03-26 ENCOUNTER — Encounter: Payer: Self-pay | Admitting: Rehabilitation

## 2020-03-26 ENCOUNTER — Other Ambulatory Visit: Payer: Self-pay

## 2020-03-26 DIAGNOSIS — R2689 Other abnormalities of gait and mobility: Secondary | ICD-10-CM | POA: Diagnosis present

## 2020-03-26 DIAGNOSIS — I890 Lymphedema, not elsewhere classified: Secondary | ICD-10-CM | POA: Diagnosis present

## 2020-03-26 DIAGNOSIS — M6281 Muscle weakness (generalized): Secondary | ICD-10-CM | POA: Diagnosis present

## 2020-03-26 NOTE — Therapy (Signed)
[place], [place], [address] Phone: [phone]   Fax:  [phone]  Physical Therapy Treatment  Patient Details  Name: Destiny [name] MRN: [id number] Date of Birth: 11/22/1941 Referring Provider (PT): Dr. Gery Pray   Encounter Date: 03/26/2020  PT End of Session - 03/26/20 1421    Visit Number  9    Number of Visits  13    Date for PT Re-Evaluation  04/06/20    PT Start Time  8768    PT Stop Time  1105    PT Time Calculation (min)  63 min    Activity Tolerance  Patient tolerated treatment well    Behavior During Therapy  [facility] for tasks assessed/performed       Past Medical History:  Diagnosis Date  . Anxiety   . Arthritis   . Colon polyps   . Complication of anesthesia    BP drops with fentyl and versed  . Hepatitis    hep A  1976  . Hypertension   . Hypothyroidism   . Liposarcoma of right thigh ([place]) 03/2019    Past Surgical History:  Procedure Laterality Date  . ABDOMINAL HYSTERECTOMY    . APPENDECTOMY    . BACK SURGERY      fusion  . CESAREAN SECTION     times 2  . EYE SURGERY Left    cataract removed  . FINGER ARTHRODESIS Left 10/02/2017   Procedure: LEFT LONG FINGER ARTHRODESIS;  Surgeon: Milly Jakob, MD;  Location: [place];  Service: Orthopedics;  Laterality: Left;  . FOOT SURGERY Bilateral   . FRACTURE SURGERY     broken ankle  . MEDIAL PARTIAL KNEE REPLACEMENT Left    done at [place] Right 12/28/2014   Procedure: REVERSE SHOULDER ARTHROPLASTY;  Surgeon: Nita Sells, MD;  Location: [place];  Service: Orthopedics;  Laterality: Right;  Right reverse total shoulder arthroplasty  . REVERSE TOTAL SHOULDER ARTHROPLASTY Right 12/28/2014   DR CHANDLER  . rotator cufff    . THYROIDECTOMY, PARTIAL     one lobe removed  . VARICOSE VEIN SURGERY Right     There were no vitals filed for this visit.  Subjective  Assessment - 03/26/20 1000    Subjective  I am just frustrated about this.  I feel like I can't find any information on this    Pertinent History  Liposarcoma of the Rt thigh with sartorious removed 11.5 cm intramuscular mass. Radical resection performed 04/15/19. Finished radiation 08/12/2019    Patient Stated Goals  I want to get this swelling down.    Currently in Pain?  No/denies            LYMPHEDEMA/ONCOLOGY QUESTIONNAIRE - 03/26/20 1048      Right Lower Extremity Lymphedema   20 cm Proximal to Suprapatella  56 cm    10 cm Proximal to Suprapatella  46.5 cm    At Midpatella/Popliteal Crease  41.5 cm    30 cm Proximal to Floor at Lateral Plantar Foot  40.2 cm    20 cm Proximal to Floor at Lateral Plantar Foot  32.'2 1    10 ' cm Proximal to Floor at Lateral Malleoli  24.5 cm    5 cm Proximal to 1st MTP Joint  21.4 cm    Across MTP Joint  22.1 cm    Around Proximal Great Toe  7.2 cm  [facility]  Lymphedema Life Impact Scale Total Score  25 %           [facility] Adult PT Treatment/Exercise - 03/26/20 0001      Manual Therapy   Edema Management  gave pt information on current measurements and how to keep track of measurements in the future    Manual Lymphatic Drainage (MLD)  Supine: Short neck, superficial abdominals, 5 diaphragmatic breaths, Rt axillary nodes, Rt inguino-axillary anastomosis, Bil inguinal nodes, interinguinal anastamoses, then Rt LE working from lateral upper thigh to dorsal foot working from proximal to distal retracing all steps.  Focus on more deep techniques around the lateral malleolus    Compression Bandaging  helped pt donn ready wrap             PT Education - 03/26/20 1421    Education Details  how to self measure    Person(s) Educated  Patient    Methods  Explanation    Comprehension  Verbalized understanding       PT Short Term Goals - 02/16/20 1140       PT SHORT TERM GOAL #1   Title  Pt and spouse will be independent with self MLD and HEP for the R hip within 2 weeks in order to demonstrate autonomy of care.    Baseline  pt does not perform exercises for the R hip, she walks and does not know how to perform MLD    Time  2    Period  Weeks    Status  New    Target Date  03/08/20        PT Long Term Goals - 03/26/20 1424      PT LONG TERM GOAL #1   Title  Pt will have 2 cm reduction in the R LE from the calf to the proximal thigh within 4 weeks in order to decrease risk for infection due to fluid.    Status  Partially Met      PT LONG TERM GOAL #2   Title  Pt will have thigh high compression garment to wear on a daily basis wtihin 4 weeks in order to manage lymphedema at home.    Status  Partially Met      PT LONG TERM GOAL #3   Title  Pt will demonstrate 4/5 strength in the R hip flexors in order to decrease risk for immobility and injury.    Status  On-going            Plan - 03/26/20 1422    Clinical Impression Statement  Pt is doing well managing her lymphedema at home but is having a hard time with the pyschosocial aspects of dealing with lymphedema.  Answered all questions today and encouraged pt that lymphedema is time consuming and expensive but that it starts to become a part of your daily routine.  Pt with no significant changes in measurements today vs last time with difference in thigh measurements most likely seated vs standing.  Pt most likely with 2 more visits right now and then returning with new garment to check for fit and use.    Personal Factors and Comorbidities  Age;Fitness;Behavior Pattern;Comorbidity 3+;Social Background    Comorbidities  surgical resection, radiation, OA causing knee pain, priorities    Stability/Clinical Decision Making  Stable/Uncomplicated    PT Frequency  2x / week    PT Duration  4 weeks    PT Treatment/Interventions  Therapeutic exercise;Therapeutic activities;Neuromuscular  re-education;Manual techniques    PT Next Visit Plan  Cont wrapping with minimal foot coverage or use of ready wrap  with focus on  manual lymph drainage to Rt LE;    Consulted and Agree with Plan of Care  Patient       Patient will benefit from skilled therapeutic intervention in order to improve the following deficits and impairments:  Decreased strength, Increased edema  Visit Diagnosis: Lymphedema  Other abnormalities of gait and mobility  Muscle weakness (generalized)     Problem List Patient Active Problem List   Diagnosis Date Noted  . Essential tremor 10/05/2019  . Near syncope 04/28/2019  . Adjustment disorder with mixed anxiety and depressed mood 04/28/2019  . Liposarcoma of right thigh ([place]) 04/28/2019  . Osteoarthritis of right knee 03/10/2019  . Hip pain, bilateral 01/07/2018  . Sinus tarsi syndrome 08/20/2016  . Right knee pain 07/17/2016  . S/p reverse total shoulder arthroplasty 12/28/2014  . Abnormality of gait 04/25/2014  . Foot pain 06/03/2011  . Metatarsalgia 06/03/2011  . Chronic pain syndrome 12/05/2009  . Chronic insomnia 08/15/2009  . INGROWN TOENAIL 06/06/2009  . LEG PAIN, BILATERAL 06/05/2009  . OTHER VITAMIN B12 DEFICIENCY ANEMIA 04/04/2009  . MALAISE AND FATIGUE 02/28/2009  . Hypothyroidism 07/26/2008  . HYPERLIPIDEMIA 11/05/2007  . NEUROPATHY, IDIOPATHIC PERIPHERAL NEC 09/16/2007  . Essential hypertension 01/28/2007  . Arthritis of hand, degenerative 01/28/2007  . ROTATOR CUFF INJURY, RIGHT SHOULDER 01/28/2007  . OSTEOPENIA 01/28/2007    Stark Bray 03/26/2020, 2:25 PM  [place], [place], [address] Phone: [phone]   Fax:  [phone]  Name: Destiny [name] MRN: [id number] Date of Birth: 1941/02/06

## 2020-03-28 ENCOUNTER — Encounter: Payer: Self-pay | Admitting: Rehabilitation

## 2020-03-29 ENCOUNTER — Ambulatory Visit: Payer: Medicare Other | Admitting: Sports Medicine

## 2020-03-29 ENCOUNTER — Other Ambulatory Visit: Payer: Self-pay

## 2020-03-29 DIAGNOSIS — R269 Unspecified abnormalities of gait and mobility: Secondary | ICD-10-CM

## 2020-03-29 DIAGNOSIS — M79672 Pain in left foot: Secondary | ICD-10-CM

## 2020-03-29 NOTE — Progress Notes (Signed)
Chief complaint -left foot pain  On her last visit the patient was noted to have a small avulsion from her navicular where her posterior tibialis tendon had been reattached This has improved some using compression but the compression does become painful She feels she puts more weight on her left leg since removal of the liposarcoma and her right thigh She does get some swelling in the left foot that is less when she uses compression Pain still occurs if she puts too much weight or walks too much with that foot  Review of systems Right leg swelling has been extensive since the surgical removal of her liposarcoma This has improved though using a compressive wrap that involves her full right leg  Physical exam Pleasant older female in no acute distress BP (!) 138/54   Ht 5\' 1"  (1.549 m)   Wt 155 lb (70.3 kg)   BMI 29.29 kg/m   There is a scar on her medial ankle from the medial malleolus to the navicular She continues to have an area of point tenderness just along the navicular There is less swelling than was noted at her last visit She has full motion and function as before Chronic foot changes are the same  I reviewed her over-the-counter orthotic and it has a metatarsal pad and only moderate long arch support

## 2020-03-29 NOTE — Assessment & Plan Note (Signed)
Currently her left foot pain is from a small avulsion over a previous surgical reattachment of her posterior tibialis tendon to her navicular  Today I built up the medial arch of her over-the-counter orthotics by using a scaphoid pad Once I placed this pad she was able to walk without a limp She felt significant relief of pain  We will try this for the next month and if it is helping we should add this sort of arch support into all of her left shoes

## 2020-03-29 NOTE — Assessment & Plan Note (Signed)
She uses an over-the-counter orthotic that was not completely controlling her pronation Today with addition of a scaphoid pad I think she is getting better foot placement and less pronation

## 2020-03-30 ENCOUNTER — Encounter: Payer: Self-pay | Admitting: Rehabilitation

## 2020-03-30 ENCOUNTER — Ambulatory Visit: Payer: Medicare Other | Admitting: Rehabilitation

## 2020-03-30 DIAGNOSIS — M6281 Muscle weakness (generalized): Secondary | ICD-10-CM

## 2020-03-30 DIAGNOSIS — R2689 Other abnormalities of gait and mobility: Secondary | ICD-10-CM

## 2020-03-30 DIAGNOSIS — I890 Lymphedema, not elsewhere classified: Secondary | ICD-10-CM

## 2020-03-30 NOTE — Therapy (Addendum)
[place], [place], [address] Phone: [phone]   Fax:  [phone]  Physical Therapy Treatment  Patient Details  Name: Destiny [name] MRN: [id number] Date of Birth: November 15, 1941 Referring Provider (PT): Dr. Gery Pray   Encounter Date: 03/30/2020   Progress Note Reporting Period 02/16/20 to 03/30/20  See note below for Objective Data and Assessment of Progress/Goals.      PT End of Session - 03/30/20 1053    Visit Number  10    Number of Visits  13    Date for PT Re-Evaluation  04/06/20    PT Start Time  1000    PT Stop Time  1100    PT Time Calculation (min)  60 min    Activity Tolerance  Patient tolerated treatment well    Behavior During Therapy  WFL for tasks assessed/performed       Past Medical History:  Diagnosis Date  . Anxiety   . Arthritis   . Colon polyps   . Complication of anesthesia    BP drops with fentyl and versed  . Hepatitis    hep A  1976  . Hypertension   . Hypothyroidism   . Liposarcoma of right thigh ([place]) 03/2019    Past Surgical History:  Procedure Laterality Date  . ABDOMINAL HYSTERECTOMY    . APPENDECTOMY    . BACK SURGERY      fusion  . CESAREAN SECTION     times 2  . EYE SURGERY Left    cataract removed  . FINGER ARTHRODESIS Left 10/02/2017   Procedure: LEFT LONG FINGER ARTHRODESIS;  Surgeon: Milly Jakob, MD;  Location: [facility];  Service: Orthopedics;  Laterality: Left;  . FOOT SURGERY Bilateral   . FRACTURE SURGERY     broken ankle  . MEDIAL PARTIAL KNEE REPLACEMENT Left    done at [place] Right 12/28/2014   Procedure: REVERSE SHOULDER ARTHROPLASTY;  Surgeon: Nita Sells, MD;  Location: [facility];  Service: Orthopedics;  Laterality: Right;  Right reverse total shoulder arthroplasty  . REVERSE TOTAL SHOULDER ARTHROPLASTY Right 12/28/2014   DR CHANDLER  . rotator cufff    .  THYROIDECTOMY, PARTIAL     one lobe removed  . VARICOSE VEIN SURGERY Right     There were no vitals filed for this visit.  Subjective Assessment - 03/30/20 1003    Subjective  I took it off to shower this morning.  My ankle is just stubborn.  I have also had to use the wrap instead of the velcro on the knee because I just don't like it.    Pertinent History  Liposarcoma of the Rt thigh with sartorious removed 11.5 cm intramuscular mass. Radical resection performed 04/15/19. Finished radiation 08/12/2019    Currently in Pain?  No/denies                  Outpatient Re[facility]16/[facility]  Lymphedema Life Impact Scale Total Score  25 %           [facility] Adult PT Treatment/Exercise - 03/30/20 0001      Manual Therapy   Edema Management  Melissa from [facility] arrived around 10:45 to trouble shoot garments and talk about thigh high compression garments;  pt will continue to wear helix ankle brace with her velcro except at night where she will bandage it when needed.  Pt okay with trying Juzo  Dynamic thigh high open toe    Manual Lymphatic Drainage (MLD)  Supine: Short neck, superficial abdominals, 5 diaphragmatic breaths, Rt axillary nodes, Rt inguino-axillary anastomosis, Bil inguinal nodes, interinguinal anastamoses, then Rt LE working from lateral upper thigh to dorsal foot working from proximal to distal retracing all steps.  Focus on more deep techniques around the lateral malleolus               PT Short Term Goals - 02/16/20 1140      PT SHORT TERM GOAL #1   Title  Pt and spouse will be independent with self MLD and HEP for the R hip within 2 weeks in order to demonstrate autonomy of care.    Baseline  pt does not perform exercises for the R hip, she walks and does not know how to perform MLD    Time  2    Period  Weeks    Status  New    Target Date  03/08/20        PT Long Term Goals - 03/30/20 1210      PT  LONG TERM GOAL #1   Title  Pt will have 2 cm reduction in the R LE from the calf to the proximal thigh within 4 weeks in order to decrease risk for infection due to fluid.    Baseline  Pt with changes anywhere from 0 to 3.5cm at the upper thigh.  Average reduction of 2.1cm overall    Status  Partially Met      PT LONG TERM GOAL #2   Title  Pt will have thigh high compression garment to wear on a daily basis wtihin 4 weeks in order to manage lymphedema at home.    Status  Partially Met      PT LONG TERM GOAL #3   Title  Pt will demonstrate 4/5 strength in the R hip flexors in order to decrease risk for immobility and injury.    Status  On-going            Plan - 03/30/20 1209    Clinical Impression Statement  Pt continues to do well managing her lymphedema and is able to trouble shoot problem areas at the knee and ankle with wrapping and other braces she owns.  Still with edema stubborn around the lateral malleolus.  pt measured for thigh high garment today    PT Frequency  2x / week    PT Duration  4 weeks    PT Treatment/Interventions  Therapeutic exercise;Therapeutic activities;Neuromuscular re-education;Manual techniques    PT Next Visit Plan  Cont wrapping with minimal foot coverage or use of ready wrap  with focus on  manual lymph drainage to Rt LE;    Recommended Other Services  measured for all garments now    Consulted and Agree with Plan of Care  Patient       Patient will benefit from skilled therapeutic intervention in order to improve the following deficits and impairments:  Decreased strength, Increased edema  Visit Diagnosis: Lymphedema  Other abnormalities of gait and mobility  Muscle weakness (generalized)     Problem List Patient Active Problem List   Diagnosis Date Noted  . Essential tremor 10/05/2019  . Near syncope 04/28/2019  . Adjustment disorder with mixed anxiety and depressed mood 04/28/2019  . Liposarcoma of right thigh ([place]) 04/28/2019  .  Osteoarthritis of right knee 03/10/2019  . Hip pain, bilateral 01/07/2018  . Sinus tarsi syndrome 08/20/2016  . Right  knee pain 07/17/2016  . S/p reverse total shoulder arthroplasty 12/28/2014  . Abnormality of gait 04/25/2014  . Foot pain 06/03/2011  . Metatarsalgia 06/03/2011  . Chronic pain syndrome 12/05/2009  . Chronic insomnia 08/15/2009  . INGROWN TOENAIL 06/06/2009  . LEG PAIN, BILATERAL 06/05/2009  . OTHER VITAMIN B12 DEFICIENCY ANEMIA 04/04/2009  . MALAISE AND FATIGUE 02/28/2009  . Hypothyroidism 07/26/2008  . HYPERLIPIDEMIA 11/05/2007  . NEUROPATHY, IDIOPATHIC PERIPHERAL NEC 09/16/2007  . Essential hypertension 01/28/2007  . Arthritis of hand, degenerative 01/28/2007  . ROTATOR CUFF INJURY, RIGHT SHOULDER 01/28/2007  . OSTEOPENIA 01/28/2007    Stark Bray 03/30/2020, 12:14 PM  [place], [place], [address] Phone: [phone]   Fax:  [phone]  Name: Andy Moye MRN: [id number] Date of Birth: Apr 13, 1941

## 2020-04-02 ENCOUNTER — Ambulatory Visit: Payer: Medicare Other | Admitting: Rehabilitation

## 2020-04-02 ENCOUNTER — Other Ambulatory Visit: Payer: Self-pay

## 2020-04-02 ENCOUNTER — Encounter: Payer: Self-pay | Admitting: Rehabilitation

## 2020-04-02 DIAGNOSIS — I890 Lymphedema, not elsewhere classified: Secondary | ICD-10-CM

## 2020-04-02 DIAGNOSIS — M6281 Muscle weakness (generalized): Secondary | ICD-10-CM

## 2020-04-02 DIAGNOSIS — R2689 Other abnormalities of gait and mobility: Secondary | ICD-10-CM

## 2020-04-02 NOTE — Therapy (Signed)
[place], [place], [address] Phone: [phone]   Fax:  [phone]  Physical Therapy Treatment  Patient Details  Name: Destiny [name] MRN: [id number] Date of Birth: [birth year] Referring Provider (PT): Dr. Gery Pray   Encounter Date: 04/02/2020  PT End of Session - 04/02/20 1750    Visit Number  11    Number of Visits  13    Date for PT Re-Evaluation  04/06/20    PT Start Time  1000    PT Stop Time  1055    PT Time Calculation (min)  55 min    Activity Tolerance  Patient tolerated treatment well    Behavior During Therapy  [facility] for tasks assessed/performed       Past Medical History:  Diagnosis Date  . Anxiety   . Arthritis   . Colon polyps   . Complication of anesthesia    BP drops with fentyl and versed  . Hepatitis    hep A  1976  . Hypertension   . Hypothyroidism   . Liposarcoma of right thigh ([place]) 03/2019    Past Surgical History:  Procedure Laterality Date  . ABDOMINAL HYSTERECTOMY    . APPENDECTOMY    . BACK SURGERY      fusion  . CESAREAN SECTION     times 2  . EYE SURGERY Left    cataract removed  . FINGER ARTHRODESIS Left 10/02/2017   Procedure: LEFT LONG FINGER ARTHRODESIS;  Surgeon: Milly Jakob, MD;  Location: [facility];  Service: Orthopedics;  Laterality: Left;  . FOOT SURGERY Bilateral   . FRACTURE SURGERY     broken ankle  . MEDIAL PARTIAL KNEE REPLACEMENT Left    done at [facility] Right 12/28/2014   Procedure: REVERSE SHOULDER ARTHROPLASTY;  Surgeon: Nita Sells, MD;  Location: [facility];  Service: Orthopedics;  Laterality: Right;  Right reverse total shoulder arthroplasty  . REVERSE TOTAL SHOULDER ARTHROPLASTY Right 12/28/2014   DR CHANDLER  . rotator cufff    . THYROIDECTOMY, PARTIAL     one lobe removed  . VARICOSE VEIN SURGERY Right     There were no vitals filed for this  visit.  Subjective Assessment - 04/02/20 0959    Subjective  I have just been playing with different combinations of wrapping and velcro.  I decided to wrap my leg for today.    Pertinent History  Liposarcoma of the Rt thigh with sartorious removed 11.5 cm intramuscular mass. Radical resection performed 04/15/19. Finished radiation 08/12/2019    Currently in Pain?  No/denies                  Outpatient Rehab from 02/16/2020 [facility]  Lymphedema Life Impact Scale Total Score  25 %           [facility] Adult PT Treatment/Exercise - 04/02/20 0001      Manual Therapy   Edema Management  made 1/2" gray foam wheel for pt to try with self bandaging     Manual Lymphatic Drainage (MLD)  Supine: Short neck, superficial abdominals, 5 diaphragmatic breaths, Rt axillary nodes, Rt inguino-axillary anastomosis, Bil inguinal nodes, interinguinal anastamoses, then Rt LE working from lateral upper thigh to dorsal foot working from proximal to distal retracing all steps.  Focus on more deep techniques around the lateral malleolus    Compression Bandaging  helped pt donn ready wrap  PT Short Term Goals - 02/16/20 1140      PT SHORT TERM GOAL #1   Title  Pt and spouse will be independent with self MLD and HEP for the R hip within 2 weeks in order to demonstrate autonomy of care.    Baseline  pt does not perform exercises for the R hip, she walks and does not know how to perform MLD    Time  2    Period  Weeks    Status  New    Target Date  03/08/20        PT Long Term Goals - 03/30/20 1210      PT LONG TERM GOAL #1   Title  Pt will have 2 cm reduction in the R LE from the calf to the proximal thigh within 4 weeks in order to decrease risk for infection due to fluid.    Baseline  Pt with changes anywhere from 0 to 3.5cm at the upper thigh.  Average reduction of 2.1cm overall    Status  Partially Met      PT LONG TERM GOAL #2    Title  Pt will have thigh high compression garment to wear on a daily basis wtihin 4 weeks in order to manage lymphedema at home.    Status  Partially Met      PT LONG TERM GOAL #3   Title  Pt will demonstrate 4/5 strength in the R hip flexors in order to decrease risk for immobility and injury.    Status  On-going            Plan - 04/02/20 1751    Clinical Impression Statement  Pt arrives back in compression wraps today trying to get the ankle down a bit.  Pt is independent with wrapping and able to use her velcro wraps well independently.  Pt will be out of town for 3 weeks and will return after this to make sure her compression stocking is working well and possibly for more MLD as pt is unable to reach her lower leg.    PT Frequency  2x / week    PT Duration  4 weeks    PT Next Visit Plan  Remeasure after 3 week break, get stocking? Cont wrapping with minimal foot coverage or use of ready wrap  with focus on  manual lymph drainage to Rt LE;    Consulted and Agree with Plan of Care  Patient       Patient will benefit from skilled therapeutic intervention in order to improve the following deficits and impairments:     Visit Diagnosis: Lymphedema  Other abnormalities of gait and mobility  Muscle weakness (generalized)     Problem List Patient Active Problem List   Diagnosis Date Noted  . Essential tremor 10/05/2019  . Near syncope 04/28/2019  . Adjustment disorder with mixed anxiety and depressed mood 04/28/2019  . Liposarcoma of right thigh ([place]) 04/28/2019  . Osteoarthritis of right knee 03/10/2019  . Hip pain, bilateral 01/07/2018  . Sinus tarsi syndrome 08/20/2016  . Right knee pain 07/17/2016  . S/p reverse total shoulder arthroplasty 12/28/2014  . Abnormality of gait 04/25/2014  . Foot pain 06/03/2011  . Metatarsalgia 06/03/2011  . Chronic pain syndrome 12/05/2009  . Chronic insomnia 08/15/2009  . INGROWN TOENAIL 06/06/2009  . LEG PAIN, BILATERAL 06/05/2009   . OTHER VITAMIN B12 DEFICIENCY ANEMIA 04/04/2009  . MALAISE AND FATIGUE 02/28/2009  . Hypothyroidism 07/26/2008  . HYPERLIPIDEMIA 11/05/2007  .  NEUROPATHY, IDIOPATHIC PERIPHERAL NEC 09/16/2007  . Essential hypertension 01/28/2007  . Arthritis of hand, degenerative 01/28/2007  . ROTATOR CUFF INJURY, RIGHT SHOULDER 01/28/2007  . OSTEOPENIA 01/28/2007    Stark Bray 04/02/2020, 5:53 PM  [place], [place], [address] Phone: [phone]   Fax:  [phone]  Name: Briann Sarchet MRN: [id number] Date of Birth: [birth year]

## 2020-04-03 ENCOUNTER — Encounter: Payer: Self-pay | Admitting: Family Medicine

## 2020-04-03 ENCOUNTER — Ambulatory Visit (INDEPENDENT_AMBULATORY_CARE_PROVIDER_SITE_OTHER): Payer: Medicare Other | Admitting: Family Medicine

## 2020-04-03 VITALS — BP 138/60 | HR 67 | Temp 97.8°F | Wt 161.5 lb

## 2020-04-03 DIAGNOSIS — I10 Essential (primary) hypertension: Secondary | ICD-10-CM | POA: Diagnosis not present

## 2020-04-03 DIAGNOSIS — Z862 Personal history of diseases of the blood and blood-forming organs and certain disorders involving the immune mechanism: Secondary | ICD-10-CM

## 2020-04-03 DIAGNOSIS — E039 Hypothyroidism, unspecified: Secondary | ICD-10-CM

## 2020-04-03 DIAGNOSIS — F5104 Psychophysiologic insomnia: Secondary | ICD-10-CM | POA: Diagnosis not present

## 2020-04-03 LAB — CBC WITH DIFFERENTIAL/PLATELET
Basophils Absolute: 0.1 10*3/uL (ref 0.0–0.1)
Basophils Relative: 1.4 % (ref 0.0–3.0)
Eosinophils Absolute: 0.2 10*3/uL (ref 0.0–0.7)
Eosinophils Relative: 2.4 % (ref 0.0–5.0)
HCT: 35.4 % — ABNORMAL LOW (ref 36.0–46.0)
Hemoglobin: 12.1 g/dL (ref 12.0–15.0)
Lymphocytes Relative: 16.1 % (ref 12.0–46.0)
Lymphs Abs: 1.1 10*3/uL (ref 0.7–4.0)
MCHC: 34.1 g/dL (ref 30.0–36.0)
MCV: 103.2 fl — ABNORMAL HIGH (ref 78.0–100.0)
Monocytes Absolute: 0.6 10*3/uL (ref 0.1–1.0)
Monocytes Relative: 8.4 % (ref 3.0–12.0)
Neutro Abs: 5 10*3/uL (ref 1.4–7.7)
Neutrophils Relative %: 71.7 % (ref 43.0–77.0)
Platelets: 325 10*3/uL (ref 150.0–400.0)
RBC: 3.43 Mil/uL — ABNORMAL LOW (ref 3.87–5.11)
RDW: 12.9 % (ref 11.5–15.5)
WBC: 7 10*3/uL (ref 4.0–10.5)

## 2020-04-03 LAB — BASIC METABOLIC PANEL
BUN: 27 mg/dL — ABNORMAL HIGH (ref 6–23)
CO2: 29 mEq/L (ref 19–32)
Calcium: 9.1 mg/dL (ref 8.4–10.5)
Chloride: 99 mEq/L (ref 96–112)
Creatinine, Ser: 0.89 mg/dL (ref 0.40–1.20)
GFR: 60.85 mL/min (ref >60.00)
Glucose, Bld: 100 mg/dL — ABNORMAL HIGH (ref 70–99)
Potassium: 5.1 mEq/L (ref 3.5–5.1)
Sodium: 136 mEq/L (ref 135–145)

## 2020-04-03 LAB — TSH: TSH: 5.62 u[IU]/mL — ABNORMAL HIGH (ref 0.35–4.50)

## 2020-04-03 NOTE — Progress Notes (Signed)
Subjective:     Patient ID: Destiny [name], female   DOB: [birth year], 79 y.o.   MRN: [id number]  HPI Destiny [name] is seen for routine medical follow-up.  She just established primary care with [place] in November.  She was diagnosed last year with liposarcoma of the right thigh and is followed over at [facility] for that.  She has had some recent problems with her left foot and is followed by [facility] sports medicine for that.  She has had some lymphedema issues with her right leg and this is been treated through the lymphedema clinic over at [facility]  She has history of subtotal thyroidectomy years ago and most recent TSH 4.6.  No adjustment was made medication.  She needs follow-up at this time.  She has past history of anemia following surgery for her right thigh.  She is requesting repeat CBC today.  She still takes iron every other day.  She has history of hyponatremia and requesting basic metabolic panel.  Does not use any thiazides.  Her blood pressures been stable on combination therapy with Toprol, Benicar, and amlodipine  She been followed for several years through chronic pain management clinic with Dr. Hardin Negus.  She feels her chronic pain is stable overall.  She has had chronic insomnia for years.  She and her husband have a place in [place] and they enjoyed going up there in the summertime  Past Medical History:  Diagnosis Date  . Anxiety   . Arthritis   . Colon polyps   . Complication of anesthesia    BP drops with fentyl and versed  . Hepatitis    hep A  1976  . Hypertension   . Hypothyroidism   . Liposarcoma of right thigh ([place]) 03/2019   Past Surgical History:  Procedure Laterality Date  . ABDOMINAL HYSTERECTOMY    . APPENDECTOMY    . BACK SURGERY      fusion  . CESAREAN SECTION     times 2  . EYE SURGERY Left    cataract removed  . FINGER ARTHRODESIS Left 10/02/2017   Procedure: LEFT LONG FINGER ARTHRODESIS;  Surgeon: Milly Jakob, MD;  Location:  [facility];  Service: Orthopedics;  Laterality: Left;  . FOOT SURGERY Bilateral   . FRACTURE SURGERY     broken ankle  . MEDIAL PARTIAL KNEE REPLACEMENT Left    done at [place] Right 12/28/2014   Procedure: REVERSE SHOULDER ARTHROPLASTY;  Surgeon: Nita Sells, MD;  Location: [facility];  Service: Orthopedics;  Laterality: Right;  Right reverse total shoulder arthroplasty  . REVERSE TOTAL SHOULDER ARTHROPLASTY Right 12/28/2014   DR CHANDLER  . rotator cufff    . THYROIDECTOMY, PARTIAL     one lobe removed  . VARICOSE VEIN SURGERY Right     reports that she has never smoked. She has never used smokeless tobacco. She reports current alcohol use. She reports that she does not use drugs. family history includes Atrial fibrillation in her mother; Cancer - Other in her father; Heart disease in her mother. Allergies  Allergen Reactions  . Nsaids Other (See Comments)     BRUISES EASILY Other Reaction: BRUISES EASILY Other Reaction: BRUISES EASILY   . Oxycodone-Acetaminophen Other (See Comments)     VERY CONSTIPATING, does not provide pain relief  . Aspirin Other (See Comments)    Bruising   . Augmentin [Amoxicillin-Pot Clavulanate] Other (See Comments)    headache  . Ciprocin-Fluocin-Procin [  Fluocinolone]   . Ciprofloxacin Hcl     tendonitis  . Fentanyl Other (See Comments)    Does not work  . Fluclorolone Other (See Comments)  . Lyrica [Pregabalin] Other (See Comments)    dysphoria  . Oxycodone Other (See Comments)    Does not work  . Versed [Midazolam] Other (See Comments)    Does not work  . Diclofenac Sodium Other (See Comments) and Rash    Skin peeling. Voltaren gel-rash and skin peeling   . Hydromorphone Hcl Itching     Itching with large doses - able to tolerate lower doses or shorter duration  . Voltaren Gel [Diclofenac Sodium] Rash and Other (See Comments)    Skin peeling.       Review of Systems   Constitutional: Negative for chills, fatigue, fever and unexpected weight change.  Eyes: Negative for visual disturbance.  Respiratory: Negative for cough, chest tightness, shortness of breath and wheezing.   Cardiovascular: Negative for chest pain, palpitations and leg swelling.  Endocrine: Negative for polydipsia and polyuria.  Genitourinary: Negative for dysuria.  Neurological: Negative for dizziness, seizures, syncope, weakness, light-headedness and headaches.       Objective:   Physical Exam Vitals reviewed.  Constitutional:      Appearance: Normal appearance.  Cardiovascular:     Rate and Rhythm: Normal rate and regular rhythm.  Pulmonary:     Effort: Pulmonary effort is normal.     Breath sounds: Normal breath sounds.  Musculoskeletal:     Comments: He has some chronic edema right lower extremity and has compression on at this time  Neurological:     Mental Status: She is alert.        Assessment:     #1 hypertension stable.  She has had tendency toward slightly elevated systolics in the past.  Past history of hyponatremia and would try to avoid thiazides for that reason  #2 history of hypothyroidism currently on replacement.  Needs follow-up labs  #3 past history of anemia following right lower extremity surgery    Plan:     -Recheck labs today with TSH, CBC, basic metabolic panel -If hemoglobin normal should be able to stop iron at this time. -Recommend routine follow-up in 6 months and sooner as needed  Eulas Post MD [facility]

## 2020-04-04 ENCOUNTER — Encounter: Payer: Self-pay | Admitting: Radiation Oncology

## 2020-04-04 ENCOUNTER — Other Ambulatory Visit: Payer: Self-pay

## 2020-04-04 DIAGNOSIS — E039 Hypothyroidism, unspecified: Secondary | ICD-10-CM

## 2020-04-04 MED ORDER — LEVOTHYROXINE SODIUM 137 MCG PO TABS: 137.0000 ug | ORAL_TABLET | Freq: Every day | ORAL | 1 refills | Status: DC

## 2020-04-05 ENCOUNTER — Telehealth: Payer: Self-pay | Admitting: Family Medicine

## 2020-04-05 NOTE — Telephone Encounter (Signed)
Pt want to know if she can wait until the first week in sept to do her labs.Pt want a call back

## 2020-04-06 ENCOUNTER — Encounter: Payer: Self-pay | Admitting: Rehabilitation

## 2020-04-06 NOTE — Telephone Encounter (Signed)
Please advise 

## 2020-04-06 NOTE — Telephone Encounter (Signed)
I t hink they can wait until then

## 2020-04-06 NOTE — Telephone Encounter (Signed)
Pt notified and scheduled for labs

## 2020-04-26 ENCOUNTER — Other Ambulatory Visit: Payer: Self-pay

## 2020-04-26 ENCOUNTER — Ambulatory Visit: Payer: Medicare Other | Admitting: Sports Medicine

## 2020-04-26 DIAGNOSIS — M79672 Pain in left foot: Secondary | ICD-10-CM | POA: Diagnosis not present

## 2020-04-26 NOTE — Patient Instructions (Signed)
I would try to use some arnica gel  OK to also try aspercream  You can use a compression stocking as long as you do not get an increase in swelling or pain Or continue the body helix  Improves healing to get 3 walks up to about 5 mins at a time as long pain allows  Right Hip Easy swings forward Easy to the side Easy back  All while standing Do about 10 swings in each direction  See me in 2 months

## 2020-04-26 NOTE — Assessment & Plan Note (Signed)
She did get good relief using a scaphoid pad and a compression sleeve for her left foot The left foot has extensive chronic breakdown She needs to remain in supportive shoes and these really help  I suggested adding some Arnica gel over the area of tenderness Continue to use a scaphoid pad She can try a compression sock if that is more comfortable than the compression sleeve  We will recheck this in 2 months

## 2020-04-26 NOTE — Progress Notes (Signed)
Chief complaint left foot pain  Patient comes back in follow-up of left foot pain 1 month ago we found that she had a small avulsion from her navicular where she had had a surgical reattachment of her posterior tibialis When she wears her supportive sandals along with a scaphoid pad and uses a body helix compression sleeve, she has no pain When she does take off the supportive shoes and the compression sleeve she has some pain directly over the navicular This is definitely improved from last visit The compression sleeve is somewhat bulky to wear and she would like to try a compression hose similar to the one she uses for her lymphedema on her right leg  Past history Sarcoma resection in the right distal thigh with an MRI on 04/05/2020 This shows a persistent fluid collection at the surgical site that is slightly smaller The other issue is chronic gluteus medius and minimus tendon tears and hamstring tendinopathy  Review of systems Less swelling in the left foot after using compression  able to walk short distances without pain  Physical examination Pleasant female with hearing difficulty in no acute distress BP (!) 139/57   Ht 5\' 1"  (1.549 m)   Wt 155 lb (70.3 kg)   BMI 29.29 kg/m   Left foot reveals a well-healed surgical scar coming to the navicular just anterior to the medial malleolus There is no puffiness or redness over this area today Minimal tenderness to palpation Good range of motion of the ankle Other chronic changes as noted on last visit  Right leg has new compression hose and seems much less swollen

## 2020-04-30 ENCOUNTER — Other Ambulatory Visit: Payer: Self-pay

## 2020-04-30 ENCOUNTER — Ambulatory Visit: Payer: Medicare Other | Attending: Radiation Oncology | Admitting: Rehabilitation

## 2020-04-30 ENCOUNTER — Encounter: Payer: Self-pay | Admitting: Rehabilitation

## 2020-04-30 DIAGNOSIS — I890 Lymphedema, not elsewhere classified: Secondary | ICD-10-CM | POA: Insufficient documentation

## 2020-04-30 DIAGNOSIS — R2689 Other abnormalities of gait and mobility: Secondary | ICD-10-CM | POA: Insufficient documentation

## 2020-04-30 DIAGNOSIS — M6281 Muscle weakness (generalized): Secondary | ICD-10-CM | POA: Diagnosis present

## 2020-04-30 NOTE — Therapy (Signed)
[place], [place], [address] Phone: [phone]   Fax:  [phone]  Physical Therapy Treatment  Patient Details  Name: Destiny [name] MRN: [id number] Date of Birth: 11/28/1940 Referring Provider (PT): Dr. Gery Pray   Encounter Date: 04/30/2020  PT End of Session - 04/30/20 1659    Visit Number  12    Number of Visits  20    Date for PT Re-Evaluation  06/21/20    PT Start Time  1503    PT Stop Time  1602    PT Time Calculation (min)  59 min    Activity Tolerance  Patient tolerated treatment well    Behavior During Therapy  [facility] for tasks assessed/performed       Past Medical History:  Diagnosis Date  . Anxiety   . Arthritis   . Colon polyps   . Complication of anesthesia    BP drops with fentyl and versed  . Hepatitis    hep A  1976  . Hypertension   . Hypothyroidism   . Liposarcoma of right thigh ([place]) 03/2019    Past Surgical History:  Procedure Laterality Date  . ABDOMINAL HYSTERECTOMY    . APPENDECTOMY    . BACK SURGERY      fusion  . CESAREAN SECTION     times 2  . EYE SURGERY Left    cataract removed  . FINGER ARTHRODESIS Left 10/02/2017   Procedure: LEFT LONG FINGER ARTHRODESIS;  Surgeon: Milly Jakob, MD;  Location: [place];  Service: Orthopedics;  Laterality: Left;  . FOOT SURGERY Bilateral   . FRACTURE SURGERY     broken ankle  . MEDIAL PARTIAL KNEE REPLACEMENT Left    done at [place] Right 12/28/2014   Procedure: REVERSE SHOULDER ARTHROPLASTY;  Surgeon: Nita Sells, MD;  Location: [place];  Service: Orthopedics;  Laterality: Right;  Right reverse total shoulder arthroplasty  . REVERSE TOTAL SHOULDER ARTHROPLASTY Right 12/28/2014   DR CHANDLER  . rotator cufff    . THYROIDECTOMY, PARTIAL     one lobe removed  . VARICOSE VEIN SURGERY Right     There were no vitals filed for this  visit.  Subjective Assessment - 04/30/20 1500    Subjective  I got a new MRI.  It shows a decrease in edema pocket and no evidence of disease.  I am wanting your help on what I should be using at this point.  I like the stocking but it seems to be stretching out I think it has gotten smaller    Pertinent History  Liposarcoma of the Rt thigh with sartorious removed 11.5 cm intramuscular mass. Radical resection performed 04/15/19. Finished radiation 08/12/2019    Patient Stated Goals  I want to get this swelling down.    Currently in Pain?  No/denies            LYMPHEDEMA/ONCOLOGY QUESTIONNAIRE - 04/30/20 0001      Right Lower Extremity Lymphedema   20 cm Proximal to Suprapatella  50.5 cm    10 cm Proximal to Suprapatella  42.8 cm    At Midpatella/Popliteal Crease  41 cm    30 cm Proximal to Floor at Lateral Plantar Foot  38.5 cm    20 cm Proximal to Floor at Lateral Plantar Foot  '31 1    10 ' cm Proximal to Floor at Lateral Malleoli  24.1 cm    5 cm Proximal  to 1st MTP Joint  21.8 cm    Across MTP Joint  21.4 cm    Around Proximal Great Toe  7.2 cm            Outpatient Rehab from 02/16/2020 in [place]  Lymphedema Life Impact Scale Total Score  25 %           [facility] Adult PT Treatment/Exercise - 04/30/20 0001      Manual Therapy   Edema Management  helped pt donn thigh high stocking which may be a bit too short.  PT emailed Melissa and pt was isntructed to call about an exchange.      Manual Lymphatic Drainage (MLD)  Supine: Short neck, superficial abdominals, 5 diaphragmatic breaths, Rt axillary nodes, Rt inguino-axillary anastomosis, BRt inguinal nodes, then Rt LE working from lateral upper thigh to dorsal foot working from proximal to distal retracing all steps.  Focus on more deep techniques around the lateral malleolus and ankle               PT Short Term Goals - 02/16/20 1140      PT SHORT TERM GOAL #1   Title  Pt and  spouse will be independent with self MLD and HEP for the R hip within 2 weeks in order to demonstrate autonomy of care.    Baseline  pt does not perform exercises for the R hip, she walks and does not know how to perform MLD    Time  2    Period  Weeks    Status  New    Target Date  03/08/20        PT Long Term Goals - 03/30/20 1210      PT LONG TERM GOAL #1   Title  Pt will have 2 cm reduction in the R LE from the calf to the proximal thigh within 4 weeks in order to decrease risk for infection due to fluid.    Baseline  Pt with changes anywhere from 0 to 3.5cm at the upper thigh.  Average reduction of 2.1cm overall    Status  Partially Met      PT LONG TERM GOAL #2   Title  Pt will have thigh high compression garment to wear on a daily basis wtihin 4 weeks in order to manage lymphedema at home.    Status  Partially Met      PT LONG TERM GOAL #3   Title  Pt will demonstrate 4/5 strength in the R hip flexors in order to decrease risk for immobility and injury.    Status  On-going            Plan - 04/30/20 1701    Clinical Impression Statement  Pt returns after 3 week trip with the [place] having decreased in all measurement locations and pt happy with her new stocking but thinking that it is maybe a little short.  Pt would like to keep getting assistance with MLD due to difficulty reaching the lower leg but overall is doing well managing her lymphedema    PT Frequency  2x / week    PT Duration  4 weeks    PT Treatment/Interventions  Therapeutic exercise;Therapeutic activities;Neuromuscular re-education;Manual techniques    PT Next Visit Plan  MLD Rt LE; any hip mobility exercises    Consulted and Agree with Plan of Care  Patient       Patient will benefit from skilled therapeutic intervention in order to  improve the following deficits and impairments:     Visit Diagnosis: Lymphedema  Other abnormalities of gait and mobility  Muscle weakness  (generalized)     Problem List Patient Active Problem List   Diagnosis Date Noted  . Essential tremor 10/05/2019  . Near syncope 04/28/2019  . Adjustment disorder with mixed anxiety and depressed mood 04/28/2019  . Liposarcoma of right thigh ([place]) 04/28/2019  . Osteoarthritis of right knee 03/10/2019  . Hip pain, bilateral 01/07/2018  . Sinus tarsi syndrome 08/20/2016  . Right knee pain 07/17/2016  . S/p reverse total shoulder arthroplasty 12/28/2014  . Abnormality of gait 04/25/2014  . Foot pain 06/03/2011  . Metatarsalgia 06/03/2011  . Chronic pain syndrome 12/05/2009  . Chronic insomnia 08/15/2009  . INGROWN TOENAIL 06/06/2009  . LEG PAIN, BILATERAL 06/05/2009  . OTHER VITAMIN B12 DEFICIENCY ANEMIA 04/04/2009  . MALAISE AND FATIGUE 02/28/2009  . Hypothyroidism 07/26/2008  . HYPERLIPIDEMIA 11/05/2007  . NEUROPATHY, IDIOPATHIC PERIPHERAL NEC 09/16/2007  . Essential hypertension 01/28/2007  . Arthritis of hand, degenerative 01/28/2007  . ROTATOR CUFF INJURY, RIGHT SHOULDER 01/28/2007  . OSTEOPENIA 01/28/2007     Shan Levans, PT  04/30/2020, 5:20 PM  [place], [place], [address] Phone: [phone]   Fax:  [phone]  Name: Destiny [name] MRN: [id number] Date of Birth: November 30, 1940

## 2020-05-07 ENCOUNTER — Other Ambulatory Visit: Payer: Self-pay | Admitting: Family Medicine

## 2020-05-07 NOTE — Telephone Encounter (Signed)
Last ov:04/03/20 Last filled:02/07/20

## 2020-05-29 ENCOUNTER — Ambulatory Visit: Payer: Medicare Other | Attending: Radiation Oncology | Admitting: Rehabilitation

## 2020-05-29 ENCOUNTER — Other Ambulatory Visit: Payer: Self-pay

## 2020-05-29 ENCOUNTER — Encounter: Payer: Self-pay | Admitting: Rehabilitation

## 2020-05-29 DIAGNOSIS — I890 Lymphedema, not elsewhere classified: Secondary | ICD-10-CM | POA: Diagnosis not present

## 2020-05-29 DIAGNOSIS — M6281 Muscle weakness (generalized): Secondary | ICD-10-CM | POA: Diagnosis present

## 2020-05-29 DIAGNOSIS — R2689 Other abnormalities of gait and mobility: Secondary | ICD-10-CM | POA: Insufficient documentation

## 2020-05-29 NOTE — Therapy (Signed)
[place], [place], [address] Phone: [phone]   Fax:  [phone]  Physical Therapy Treatment  Patient Details  Name: Destiny [name] MRN: [id number] Date of Birth: [birth year] Referring Provider (PT): Dr. Gery Pray   Encounter Date: 05/29/2020   PT End of Session - 05/29/20 1344    Visit Number 13    Number of Visits 20    Date for PT Re-Evaluation 06/21/20    PT Start Time 1100    PT Stop Time 1158    PT Time Calculation (min) 58 min    Activity Tolerance Patient tolerated treatment well    Behavior During Therapy [facility] for tasks assessed/performed           Past Medical History:  Diagnosis Date  . Anxiety   . Arthritis   . Colon polyps   . Complication of anesthesia    BP drops with fentyl and versed  . Hepatitis    hep A  1976  . Hypertension   . Hypothyroidism   . Liposarcoma of right thigh ([place]) 03/2019    Past Surgical History:  Procedure Laterality Date  . ABDOMINAL HYSTERECTOMY    . APPENDECTOMY    . BACK SURGERY      fusion  . CESAREAN SECTION     times 2  . EYE SURGERY Left    cataract removed  . FINGER ARTHRODESIS Left 10/02/2017   Procedure: LEFT LONG FINGER ARTHRODESIS;  Surgeon: Milly Jakob, MD;  Location: [place];  Service: Orthopedics;  Laterality: Left;  . FOOT SURGERY Bilateral   . FRACTURE SURGERY     broken ankle  . MEDIAL PARTIAL KNEE REPLACEMENT Left    done at [place] Right 12/28/2014   Procedure: REVERSE SHOULDER ARTHROPLASTY;  Surgeon: Nita Sells, MD;  Location: [place];  Service: Orthopedics;  Laterality: Right;  Right reverse total shoulder arthroplasty  . REVERSE TOTAL SHOULDER ARTHROPLASTY Right 12/28/2014   DR CHANDLER  . rotator cufff    . THYROIDECTOMY, PARTIAL     one lobe removed  . VARICOSE VEIN SURGERY Right     There were no vitals filed for this visit.    Subjective Assessment - 05/29/20 1101    Subjective I have been doing quite well.  i got the new replacement longer stocking.  It stays up  well.  Some days it is hard with my arthritis.  I brought my husband    Pertinent History Liposarcoma of the Rt thigh with sartorious removed 11.5 cm intramuscular mass. Radical resection performed 04/15/19. Finished radiation 08/12/2019    Currently in Pain? No/denies                 LYMPHEDEMA/ONCOLOGY QUESTIONNAIRE - 05/29/20 0001      Right Lower Extremity Lymphedema   20 cm Proximal to Suprapatella 54 cm (P)     10 cm Proximal to Suprapatella 46 cm (P)     At Midpatella/Popliteal Crease 43 cm (P)     30 cm Proximal to Floor at Lateral Plantar Foot 39.4 cm (P)     20 cm Proximal to Floor at Lateral Plantar Foot 32.2 1 (P)     10 cm Proximal to Floor at Lateral Malleoli 24 cm (P)     5 cm Proximal to 1st MTP Joint 20.4 cm (P)     Across MTP Joint 21.2 cm (P)     Around Proximal Great Toe  7.2 cm (P)                 Outpatient Rehab from 02/16/2020 in [place]  Lymphedema Life Impact Scale Total Score 25 %            [facility] Adult PT Treatment/Exercise - 05/29/20 0001      Manual Therapy   Manual therapy comments pt/husband with questions again about the pump so pt was educated about the pump and the process.  Pt still not interested but knows it is out there.     Edema Management helped pt with a thigh high stocking with education on donning garment for husband with use of gloves and it stays glue.  . New garment fits better and reaches up closer to the hip    Manual Lymphatic Drainage (MLD) Supine: Short neck, superficial abdominals, 5 diaphragmatic breaths, Rt axillary nodes, Rt inguino-axillary anastomosis, BRt inguinal nodes, then Rt LE working from lateral upper thigh to dorsal foot working from proximal to distal retracing all steps.  Focus on more deep techniques around the lateral  malleolus and ankle                    PT Short Term Goals - 02/16/20 1140      PT SHORT TERM GOAL #1   Title Pt and spouse will be independent with self MLD and HEP for the R hip within 2 weeks in order to demonstrate autonomy of care.    Baseline pt does not perform exercises for the R hip, she walks and does not know how to perform MLD    Time 2    Period Weeks    Status New    Target Date 03/08/20             PT Long Term Goals - 05/29/20 1347      PT LONG TERM GOAL #1   Title Pt will have 2 cm reduction in the R LE from the calf to the proximal thigh within 4 weeks in order to decrease risk for infection due to fluid.    Baseline Pt with changes anywhere from 0 to 3.5cm at the upper thigh.  Average reduction of 2.1cm overall    Status Partially Met      PT LONG TERM GOAL #2   Title Pt will have thigh high compression garment to wear on a daily basis wtihin 4 weeks in order to manage lymphedema at home.    Status Achieved      PT LONG TERM GOAL #3   Title Pt will demonstrate 4/5 strength in the R hip flexors in order to decrease risk for immobility and injury.    Status Partially Met      PT LONG TERM GOAL #4   Title wean from walker to use of cane or no device for all distances    Status Achieved                 Plan - 05/29/20 1345    Clinical Impression Statement [place] no significant changes in measurements at the Rt leg compared to last visit.  Skin remains soft with some general non pitting edema in the lower leg which pt was instructed to elevate more, and only tightness being the proximal / medial thigh along the scar but improved.  Pt overall is doing MLD 2x per week but wearing compression every day.  Pt  was educated on things to do in the pool as [place]  Pt reports she will call if she needs anything so will DC today and reopen with any return.            Patient will benefit from skilled therapeutic intervention in order to improve the following deficits and impairments:     Visit Diagnosis: Lymphedema  Other abnormalities of gait and mobility  Muscle weakness (generalized)     Problem List Patient Active Problem List   Diagnosis Date Noted  . Essential tremor 10/05/2019  . Near syncope 04/28/2019  . Adjustment disorder with mixed anxiety and depressed mood 04/28/2019  . Liposarcoma of right thigh ([place]) 04/28/2019  . Osteoarthritis of right knee 03/10/2019  . Hip pain, bilateral 01/07/2018  . Sinus tarsi syndrome 08/20/2016  . Right knee pain 07/17/2016  . S/p reverse total shoulder arthroplasty 12/28/2014  . Abnormality of gait 04/25/2014  . Foot pain 06/03/2011  . Metatarsalgia 06/03/2011  . Chronic pain syndrome 12/05/2009  . Chronic insomnia 08/15/2009  . INGROWN TOENAIL 06/06/2009  . LEG PAIN, BILATERAL 06/05/2009  . OTHER VITAMIN B12 DEFICIENCY ANEMIA 04/04/2009  . MALAISE AND FATIGUE 02/28/2009  . Hypothyroidism 07/26/2008  . HYPERLIPIDEMIA 11/05/2007  . NEUROPATHY, IDIOPATHIC PERIPHERAL NEC 09/16/2007  . Essential hypertension 01/28/2007  . Arthritis of hand, degenerative 01/28/2007  . ROTATOR CUFF INJURY, RIGHT SHOULDER 01/28/2007  . OSTEOPENIA 01/28/2007   Shan Levans, PT 05/29/2020, 1:48 PM  [place], [place], [address] Phone: [phone]   Fax:  [phone]  Name: Destiny [name] MRN: [id number] Date of Birth: Apr 08, 1941  PHYSICAL THERAPY DISCHARGE SUMMARY  Visits from Start of Care: 13  Current functional level related to goals / functional outcomes: See above   Remaining deficits: Chronic Rt LE lymphedema   Education / Equipment: Self MLD, compression wraps, velcro, and thigh high garment Plan: Patient agrees to discharge.  Patient goals were partially met. Patient is being discharged due to being  pleased with the current functional level.  ?????    Shan Levans, PT

## 2020-06-25 NOTE — Addendum Note (Signed)
Addended by: Marrion Coy on: 06/25/2020 04:10 PM   Modules accepted: Orders

## 2020-07-17 ENCOUNTER — Ambulatory Visit: Payer: Medicare Other | Admitting: Sports Medicine

## 2020-07-26 ENCOUNTER — Ambulatory Visit
Admission: RE | Admit: 2020-07-26 | Discharge: 2020-07-26 | Disposition: A | Discharge status: Home / Self Care | Payer: Medicare Other | Source: Ambulatory Visit | Attending: Radiation Oncology | Admitting: Radiation Oncology

## 2020-07-26 ENCOUNTER — Encounter: Payer: Self-pay | Admitting: Radiation Oncology

## 2020-07-26 ENCOUNTER — Other Ambulatory Visit: Payer: Self-pay

## 2020-07-26 ENCOUNTER — Other Ambulatory Visit: Payer: Medicare Other

## 2020-07-26 DIAGNOSIS — Z79899 Other long term (current) drug therapy: Secondary | ICD-10-CM | POA: Insufficient documentation

## 2020-07-26 DIAGNOSIS — I890 Lymphedema, not elsewhere classified: Secondary | ICD-10-CM | POA: Insufficient documentation

## 2020-07-26 DIAGNOSIS — Z8589 Personal history of malignant neoplasm of other organs and systems: Secondary | ICD-10-CM | POA: Insufficient documentation

## 2020-07-26 DIAGNOSIS — C4921 Malignant neoplasm of connective and soft tissue of right lower limb, including hip: Secondary | ICD-10-CM

## 2020-07-26 DIAGNOSIS — Z923 Personal history of irradiation: Secondary | ICD-10-CM | POA: Insufficient documentation

## 2020-07-26 DIAGNOSIS — E039 Hypothyroidism, unspecified: Secondary | ICD-10-CM

## 2020-07-26 DIAGNOSIS — M542 Cervicalgia: Secondary | ICD-10-CM | POA: Insufficient documentation

## 2020-07-26 DIAGNOSIS — M25519 Pain in unspecified shoulder: Secondary | ICD-10-CM | POA: Insufficient documentation

## 2020-07-26 LAB — TSH: TSH: 3.81 mIU/L (ref 0.40–4.50)

## 2020-07-26 LAB — T4, FREE: Free T4: 1.5 ng/dL (ref 0.8–1.8)

## 2020-07-26 NOTE — Progress Notes (Signed)
Radiation Oncology         [phone] ________________________________  Name: Destiny [name] MRN: [id number]  Date: 07/26/2020  DOB: March 07, 1941  Follow-Up Visit Note  CC: Eulas Post, MD  Gus Puma, MD    ICD-10-CM   1. Liposarcoma of right thigh ([facility])  C49.21     Diagnosis: Stage IIIB (pT3, cN0) Liposarcoma of the Right Thigh, Grade 3   Interval Since Last Radiation: Eleven months, two weeks, and one day.  06/27/2019 through 08/12/2019 Site Technique Total Dose Dose per Fx Completed Fx Beam Energies  Extremity: Ext_Rt 3D 50/50 2 25/25 6X, 10X  Extremity: Ext_Rt_Bst 3D 16/16 2 8/8 6X    Narrative:  The patient returns today for routine follow-up.  She overall is doing well. Most recent MRI of right thigh/femur performed at [facility] on 04/05/2020 showed a slight decrease in size of a minimally complex fluid collection at the surgical site. There were noted to be unchanged findings such as gluteus medius and minimus tendon tears and hamstrings tendinopathy. Nonetheless, there was no evidence of recurrent neoplastic disease in the distal thigh after sarcoma resection. Chest x-ray on 05/08/2020 was clear.  Of note, the patient was undergoing physical therapy for lymphedema and completed it on 05/29/2020.  On review of systems, she reports ongoing pain in neck and shoulders. She also reports right leg pain with associated lymphedema, which has resulted in difficulty bending her right knee. She denies chills or fever or infection within her right lower extremity.  Patient is somewhat frustrated with her lymphedema from the time required in placing her lymphedema sleeve as well as wrapping her leg.  She is inquiring as to whether this is absolutely necessary.  I discussed that with severe lymphedema she would be at risk for infection such as cellulitis  ALLERGIES:  is allergic to nsaids, oxycodone-acetaminophen, aspirin, augmentin [amoxicillin-pot  clavulanate], ciprocin-fluocin-procin [fluocinolone], ciprofloxacin hcl, fentanyl, fluclorolone, lyrica [pregabalin], oxycodone, versed [midazolam], diclofenac sodium, hydromorphone hcl, and voltaren gel [diclofenac sodium].  Meds: Current Outpatient Medications  Medication Sig Dispense Refill  . acetaminophen (TYLENOL) 500 MG tablet Take 500 mg by mouth every 4 (four) hours as needed.    Marland Kitchen amLODipine (NORVASC) 5 MG tablet Take 1 tablet (5 mg total) by mouth daily. 90 tablet 3  . amoxicillin (AMOXIL) 500 MG capsule SMARTSIG:4 Capsule(s) By Mouth    . b complex vitamins capsule Take 1 capsule by mouth daily.    . Calcium Carb-Cholecalciferol 600-200 MG-UNIT TABS Take 1 tablet by mouth daily.     . cephALEXin (KEFLEX) 250 MG capsule Take 250 mg by mouth daily.    . cholecalciferol (VITAMIN D) 1000 UNITS tablet Take 2,000 Units by mouth daily.    Marland Kitchen docusate sodium (COLACE) 100 MG capsule Take 100 mg by mouth daily.    . ferrous sulfate 325 (65 FE) MG tablet Take 325 mg by mouth every other day.     . gabapentin (NEURONTIN) 300 MG capsule Take 600-900 mg by mouth See admin instructions. Take 300 mg every morning and900 mg at bedtime    . HYDROcodone-acetaminophen (NORCO/VICODIN) 5-325 MG tablet Take 1 tablet by mouth every 4 (four) hours as needed for moderate pain or severe pain.     Marland Kitchen ipratropium (ATROVENT) 0.06 % nasal spray PLACE 2 SPRAYS IN EACH NOSTRIL 3 TIMES A DAY 15 mL 6  . levothyroxine (SYNTHROID) 125 MCG tablet Take by mouth.    . levothyroxine (SYNTHROID) 137 MCG tablet Take  1 tablet (137 mcg total) by mouth daily before breakfast. 90 tablet 1  . loratadine-pseudoephedrine (CLARITIN-D 12-HOUR) 5-120 MG per tablet Take 1 tablet by mouth daily.    . methocarbamol (ROBAXIN) 750 MG tablet Take 750 mg by mouth 2 (two) times daily as needed for muscle spasms.     . metoprolol succinate (TOPROL-XL) 50 MG 24 hr tablet Take 1 tablet (50 mg total) by mouth 2 (two) times daily. 180 tablet 3  .  MYRBETRIQ 50 MG TB24 tablet Take 50 mg by mouth at bedtime.    . nitrofurantoin, macrocrystal-monohydrate, (MACROBID) 100 MG capsule Take 100 mg by mouth daily.     Marland Kitchen olmesartan (BENICAR) 40 MG tablet Take 1 tablet (40 mg total) by mouth daily. 90 tablet 3  . Soybean-Soy Isoflavones (SOY BALANCE PO) Take 1 tablet by mouth daily. Soy bean extract    . traZODone (DESYREL) 50 MG tablet Take 50 mg by mouth as needed.    . zolpidem (AMBIEN) 5 MG tablet TAKE 1 TABLET BY MOUTH ONCE DAILY AT BEDTIME 90 tablet 0   No current facility-administered medications for this encounter.    Physical Findings: The patient is in no acute distress. Patient is alert and oriented.  height is 5\' 1"  (1.549 m) and weight is 161 lb 3.2 oz (73.1 kg). Her temperature is 97.9 F (36.6 C). Her blood pressure is 123/61 and her pulse is 61. Her respiration is 18 and oxygen saturation is 100%.  No significant changes. Lungs are clear to auscultation bilaterally. Heart has regular rate and rhythm. No palpable cervical, supraclavicular, or axillary adenopathy. Abdomen soft, non-tender, normal bowel sounds. Skin along the right medial thigh has healed well. No dominant masses palpated. Peripheral pulses are intact in the right leg.  Patient does have lymphedema in right lower extremity extending up into the upper thigh region.  No palpable inguinal adenopathy.  Lab Findings: Lab Results  Component Value Date   WBC 7.0 04/03/2020   HGB 12.1 04/03/2020   HCT 35.4 (L) 04/03/2020   MCV 103.2 (H) 04/03/2020   PLT 325.0 04/03/2020    Radiographic Findings: No results found.  Impression: Stage IIIB (pT3, cN0) Liposarcoma of the Right Thigh, Grade 3   No evidence of recurrence on clinical exam today. Recent MRI shows no evidence of local recurrence and chest x-ray shows no signs of metastatic disease.  We discussed the importance of addressing lymphedema again today.  As above she is somewhat frustrated with the time required in  addressing this issue.  We will see if she is a potential candidate for lymphedema machine which I make management somewhat easier for her.  Plan:She will follow-up with surgical oncology in the interim at [facility]. She is scheduled to undergo a repeat MRI in December.  In light of the Patient's close follow-up at [facility] have not scheduled her for formal follow-up appointment but would be glad to speak with her on the phone or see her in the clinic as necessary.  Total time spent in this encounter was 22 minutes which included reviewing the patient's most recent right thigh/femur MRI, chest x-ray, physical therapy, physical examination, and documentation and extensive discussion on lymphedema management.  ___________________________________   Blair Promise, PhD, MD  This document serves as a record of services personally performed by Gery Pray, MD. It was created on his behalf by Clerance Lav, a trained medical scribe. The creation of this record is based on the scribe's personal observations and  the provider's statements to them. This document has been checked and approved by the attending provider.

## 2020-07-26 NOTE — Progress Notes (Signed)
Patient here for a f/u visit with Dr. Sondra Come. Patient report pain in her shoulders neck and ongoing discomfort in her right leg from lymphedema. Has problems bending her knee on the right side.  BP 123/61 (BP Location: Left Arm, Patient Position: Sitting, Cuff Size: Normal)   Pulse 61   Temp 97.9 F (36.6 C)   Resp 18   Ht 5\' 1"  (1.549 m)   Wt 161 lb 3.2 oz (73.1 kg)   SpO2 100%   BMI 30.46 kg/m   Wt Readings from Last 3 Encounters:  07/26/20 161 lb 3.2 oz (73.1 kg)  04/26/20 155 lb (70.3 kg)  04/03/20 161 lb 8 oz (73.3 kg)

## 2020-08-01 ENCOUNTER — Telehealth: Payer: Self-pay

## 2020-08-01 ENCOUNTER — Other Ambulatory Visit: Payer: Self-pay | Admitting: Radiation Oncology

## 2020-08-01 DIAGNOSIS — C4921 Malignant neoplasm of connective and soft tissue of right lower limb, including hip: Secondary | ICD-10-CM

## 2020-08-01 NOTE — Telephone Encounter (Signed)
07/31/20 Patient called stating she had talked with Dr. Sondra Come about obtaining new orders for PT at [facility]. She needs more visits and needs to use the machine that massages her leg.Patient advised will ask Dr. Sondra Come to put in orders for this.

## 2020-08-08 ENCOUNTER — Other Ambulatory Visit: Payer: Self-pay | Admitting: Family Medicine

## 2020-08-08 MED ORDER — ZOLPIDEM TARTRATE 5 MG PO TABS
5.0000 mg | ORAL_TABLET | Freq: Every day | ORAL | 0 refills | Status: DC
Start: 2020-08-08 — End: 2020-11-05

## 2020-08-08 NOTE — Telephone Encounter (Signed)
zolpidem (AMBIEN) 5 MG tablet   [facility] [address], [place] Phone:  [phone]  Fax:  [phone]

## 2020-08-08 NOTE — Addendum Note (Signed)
Addended by: Nathanial Millman E on: 08/08/2020 11:15 AM   Modules accepted: Orders

## 2020-08-08 NOTE — Addendum Note (Signed)
Addended by: Eulas Post on: 08/08/2020 12:54 PM   Modules accepted: Orders

## 2020-08-28 ENCOUNTER — Encounter: Payer: Self-pay | Admitting: Rehabilitation

## 2020-08-28 ENCOUNTER — Other Ambulatory Visit: Payer: Self-pay

## 2020-08-28 ENCOUNTER — Ambulatory Visit: Payer: Medicare Other | Attending: Radiation Oncology | Admitting: Rehabilitation

## 2020-08-28 DIAGNOSIS — M6281 Muscle weakness (generalized): Secondary | ICD-10-CM | POA: Insufficient documentation

## 2020-08-28 DIAGNOSIS — I890 Lymphedema, not elsewhere classified: Secondary | ICD-10-CM | POA: Insufficient documentation

## 2020-08-28 DIAGNOSIS — R2689 Other abnormalities of gait and mobility: Secondary | ICD-10-CM | POA: Insufficient documentation

## 2020-08-28 NOTE — Therapy (Addendum)
[place], [place], [address] Phone: [phone]   Fax:  [phone]  Physical Therapy Evaluation  Patient Details  Name: Destiny [name] MRN: [id number] Date of Birth: September 25, 1941 Referring Provider (PT): Dr. Gery Pray   Encounter Date: 08/28/2020   PT End of Session - 08/28/20 1446    Visit Number 14    Number of Visits 20    Date for PT Re-Evaluation 10/23/20    PT Start Time 1000    PT Stop Time 1057    PT Time Calculation (min) 57 min    Activity Tolerance Patient tolerated treatment well    Behavior During Therapy [facility] for tasks assessed/performed           Past Medical History:  Diagnosis Date  . Anxiety   . Arthritis   . Colon polyps   . Complication of anesthesia    BP drops with fentyl and versed  . Hepatitis    hep A  1976  . Hypertension   . Hypothyroidism   . 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 ABDOMINAL HYSTERECTOMY    . APPENDECTOMY    . BACK SURGERY      fusion  . CESAREAN SECTION     times 2  . EYE SURGERY Left    cataract removed  . FINGER ARTHRODESIS Left 10/02/2017   Procedure: LEFT LONG FINGER ARTHRODESIS;  Surgeon: Milly Jakob, MD;  Location: [place];  Service: Orthopedics;  Laterality: Left;  . FOOT SURGERY Bilateral   . FRACTURE SURGERY     broken ankle  . MEDIAL PARTIAL KNEE REPLACEMENT Left    done at [place] Right 12/28/2014   Procedure: REVERSE SHOULDER ARTHROPLASTY;  Surgeon: Nita Sells, MD;  Location: [place];  Service: Orthopedics;  Laterality: Right;  Right reverse total shoulder arthroplasty  . REVERSE TOTAL SHOULDER ARTHROPLASTY Right 12/28/2014   DR CHANDLER  . rotator cufff    . THYROIDECTOMY, PARTIAL     one lobe removed  . VARICOSE VEIN SURGERY Right     There were no vitals filed for this visit.     Subjective Assessment - 08/28/20 1002    Subjective I am just getting frustrated.  I have been wrapping myself and now using the velcro.  I just can't do the stockings they hurt my knee.    Pertinent History Liposarcoma of the Rt thigh with sartorious removed 11.5 cm intramuscular mass. Radical resection performed 04/15/19. Finished radiation 08/12/2019    Patient Stated Goals I am getting frustrated    Currently in Pain? No/denies              [facility] PT Assessment - 08/28/20 0001      Assessment   Medical Diagnosis Rt liposarcoma    Referring Provider (PT) Dr. Gery Pray    Onset Date/Surgical Date 04/15/19    Hand Dominance Right      Precautions   Precaution Comments Lymphedema      Restrictions   Weight Bearing Restrictions No      Balance Screen   Has the patient fallen in the past 6 months Yes    How many times? 1    Has the patient had a decrease in activity level because of a fear of falling?  Yes    Is the patient reluctant to leave their home because of a fear  of falling?  No      Home Environment   Living Environment Private residence    Living Arrangements Spouse/significant other    Home Access Level entry    [place] One level      Prior Function   Level of Independence Independent    Vocation Retired    Leisure walking her dog.  I tried to do the pool but it made the leg worse       Cognition   Overall Cognitive Status Within Functional Limits for tasks assessed      Observation/Other Assessments   Observations lymphedema up to the groin and into the abdomen     Skin Integrity fibrotic skin changes due to the medial R thigh       Strength   Right Hip Flexion 3-/5      Ambulation/Gait   Gait Comments careful gait              LYMPHEDEMA/ONCOLOGY QUESTIONNAIRE - 08/28/20 0001      Type   Cancer Type Liposarcoma of the R sartorious      Surgeries   Other Surgery Date 04/15/19    Number Lymph Nodes Removed 0      Treatment   Active  Chemotherapy Treatment No    Past Chemotherapy Treatment No    Active Radiation Treatment No    Past Radiation Treatment Yes    Date 08/12/19    Body Site R posterior/medial thigh     Current Hormone Treatment No    Past Hormone Therapy No      What other symptoms do you have   Are you Having Heaviness or Tightness Yes    Are you having pitting edema No    Is it Hard or Difficult finding clothes that fit Yes    Do you have infections No    Is there Decreased scar mobility Yes      Right Lower Extremity Lymphedema   20 cm Proximal to Suprapatella 56.5 cm    10 cm Proximal to Suprapatella 47.2 cm    At Midpatella/Popliteal Crease 41.5 cm    30 cm Proximal to Floor at Lateral Plantar Foot 38.4 cm    20 cm Proximal to Floor at Lateral Plantar Foot 30.2 1    10  cm Proximal to Floor at Lateral Malleoli 24 cm    5 cm Proximal to 1st MTP Joint 21.4 cm    Across MTP Joint 21.7 cm    Around Proximal Great Toe 7.6 cm                   [facility]patient [facility]  Lymphedema Life Impact Scale Total Score 25 %      Objective measurements completed on examination: See above findings.       [place] Adult PT Treatment/Exercise - 08/28/20 0001      Manual Therapy   Manual therapy comments sent information to flexitouch for home use after thkinking about it and talking about it with husband    Edema Management helped pt with a thigh high stocking with education on donning garment for husband with use of gloves and it stays glue.  . New garment fits better and reaches up closer to the hip    Manual Lymphatic Drainage (MLD) Supine: Short neck, superficial abdominals, 5 diaphragmatic breaths, Rt axillary nodes, Rt inguino-axillary anastomosis, BRt inguinal nodes, then Rt LE working from lateral upper thigh to dorsal foot working  from proximal to distal retracing all steps.  Focus on more deep techniques around the lateral malleolus and  ankle                    PT Short Term Goals - 02/16/20 1140      PT SHORT TERM GOAL #1   Title Pt and spouse will be independent with self MLD and HEP for the R hip within 2 weeks in order to demonstrate autonomy of care.    Baseline pt does not perform exercises for the R hip, she walks and does not know how to perform MLD    Time 2    Period Weeks    Status New    Target Date 03/08/20             PT Long Term Goals - 08/28/20 1449      PT LONG TERM GOAL #1   Title Pt will be ind with compression pump and/or self MLD for Rt LE lymphedema    Time 8    Period Weeks    Status New      PT LONG TERM GOAL #2   Title Pt will be ind with self bandaging or use of compression for the LE    Time 8    Period Weeks    Status New      PT LONG TERM GOAL #3   Title NA      PT LONG TERM GOAL #4   Title NAN      PT LONG TERM GOAL #5   Title NA                  Plan - 08/28/20 1446    Clinical Impression Statement Pt returns after 68month break from PT with pt able to take good care of her lymphedema but getting frustrated that it is still something she has to deal with.  Circumferential measurements smaller from the foot to the knee and slightly larger in the thigh which was good for patient to see.  Pt decided to pursue pump so information was sent to flexitouch today.  MLD was performed again with pt having pitting edema in the lower leg and soft edema build up around the lateral malleolus.  Advised pt to try ankle brace with velcro.    PT Frequency 2x / week    PT Duration 4 weeks    PT Treatment/Interventions Therapeutic exercise;Therapeutic activities;Neuromuscular re-education;Manual techniques    PT Next Visit Plan MLD Rt LE; any hip mobility exercises    Consulted and Agree with Plan of Care Patient           Pt has difficulty managing lower extremity lymphedema due to poor reach to to the lower legs and continues to have some increases in thigh  measurements over scar tissue/near scar tissue.  Pt would benefit from vasopneumatic compression to improve hyperplasia of the lower extremity due to fluid accumulation which did not improve over 5 months of self bandaging and MLD.    Patient will benefit from skilled therapeutic intervention in order to improve the following deficits and impairments:     Visit Diagnosis: Lymphedema  Other abnormalities of gait and mobility  Muscle weakness (generalized)     Problem List Patient Active Problem List   Diagnosis Date Noted  . Essential tremor 10/05/2019  . Near syncope 04/28/2019  . Adjustment disorder with mixed anxiety and depressed mood 04/28/2019  . Liposarcoma of right thigh ([place]) 04/28/2019  .  Osteoarthritis of right knee 03/10/2019  . Hip pain, bilateral 01/07/2018  . Sinus tarsi syndrome 08/20/2016  . Right knee pain 07/17/2016  . S/p reverse total shoulder arthroplasty 12/28/2014  . Abnormality of gait 04/25/2014  . Foot pain 06/03/2011  . Metatarsalgia 06/03/2011  . Chronic pain syndrome 12/05/2009  . Chronic insomnia 08/15/2009  . INGROWN TOENAIL 06/06/2009  . LEG PAIN, BILATERAL 06/05/2009  . OTHER VITAMIN B12 DEFICIENCY ANEMIA 04/04/2009  . MALAISE AND FATIGUE 02/28/2009  . Hypothyroidism 07/26/2008  . HYPERLIPIDEMIA 11/05/2007  . NEUROPATHY, IDIOPATHIC PERIPHERAL NEC 09/16/2007  . Essential hypertension 01/28/2007  . Arthritis of hand, degenerative 01/28/2007  . ROTATOR CUFF INJURY, RIGHT SHOULDER 01/28/2007  . OSTEOPENIA 01/28/2007   Larey Brick 08/28/2020, 2:51 PM  [place], [place], [address] Phone: [phone]   Fax:  [phone]  Name: Kristiane Morsch MRN: [id number] Date of Birth: 09-29-1941

## 2020-08-28 NOTE — Addendum Note (Signed)
Addended by: Stark Bray on: 08/28/2020 02:54 PM   Modules accepted: Orders

## 2020-09-06 ENCOUNTER — Ambulatory Visit: Payer: Medicare Other | Admitting: Rehabilitation

## 2020-09-06 ENCOUNTER — Encounter: Payer: Self-pay | Admitting: Rehabilitation

## 2020-09-18 ENCOUNTER — Telehealth: Payer: Self-pay | Admitting: Rehabilitation

## 2020-09-20 ENCOUNTER — Encounter: Payer: Self-pay | Admitting: Family Medicine

## 2020-09-20 NOTE — Telephone Encounter (Signed)
Returned pt call request regarding further appointments and flexitouch pump

## 2020-09-22 ENCOUNTER — Other Ambulatory Visit: Payer: Self-pay | Admitting: Family Medicine

## 2020-09-28 ENCOUNTER — Other Ambulatory Visit: Payer: Self-pay | Admitting: Family Medicine

## 2020-10-05 ENCOUNTER — Other Ambulatory Visit: Payer: Self-pay

## 2020-10-05 ENCOUNTER — Encounter: Payer: Self-pay | Admitting: Family Medicine

## 2020-10-05 ENCOUNTER — Ambulatory Visit (INDEPENDENT_AMBULATORY_CARE_PROVIDER_SITE_OTHER): Payer: Medicare Other | Admitting: Family Medicine

## 2020-10-05 VITALS — BP 122/76 | HR 69 | Temp 98.6°F | Ht 61.0 in | Wt 157.5 lb

## 2020-10-05 DIAGNOSIS — I10 Essential (primary) hypertension: Secondary | ICD-10-CM | POA: Diagnosis not present

## 2020-10-05 DIAGNOSIS — E039 Hypothyroidism, unspecified: Secondary | ICD-10-CM | POA: Diagnosis not present

## 2020-10-05 DIAGNOSIS — I890 Lymphedema, not elsewhere classified: Secondary | ICD-10-CM | POA: Diagnosis not present

## 2020-10-05 DIAGNOSIS — F5104 Psychophysiologic insomnia: Secondary | ICD-10-CM

## 2020-10-05 NOTE — Progress Notes (Signed)
Established Patient Office Visit  Subjective:  Patient ID: Destiny [name], female    DOB: [birth year]  Age: 79 y.o. MRN: [id number]  CC:  Chief Complaint  Patient presents with  . Follow-up    6 month follow up , having emda in rt leg , having trouble sleeping at night with meds, mucus in wheezing     HPI Destiny [name] presents for medical follow-up.  She has history of hypertension, chronic insomnia, hypothyroidism, essential tremor, idiopathic peripheral neuropathy, sarcoma right lower extremity which was diagnosed 2020.  She is followed over at [facility] for that.  She had some lymphedema related to that and has regular physical therapy and also started compression/massage machine which she does a hour per day about a month ago.  She feels she had a bit of wheezing recently.  She thinks this is allergy related.  No history of asthma.  She does have indoor dog.  No dyspnea with activity.  Hypothyroidism.  Recent labs reviewed with patient and stable.  She is on levothyroxine 137 mcg daily.  She has hypertension which is treated with combination therapy with Benicar, amlodipine, and metoprolol.  No recent dizziness.  No chest pains.  Past Medical History:  Diagnosis Date  . Anxiety   . Arthritis   . Colon polyps   . Complication of anesthesia    BP drops with fentyl and versed  . Hepatitis    hep A  1976  . Hypertension   . Hypothyroidism   . 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 ABDOMINAL HYSTERECTOMY    . APPENDECTOMY    . BACK SURGERY      fusion  . CESAREAN SECTION     times 2  . EYE SURGERY Left    cataract removed  . FINGER ARTHRODESIS Left 10/02/2017   Procedure: LEFT LONG FINGER ARTHRODESIS;  Surgeon: Milly Jakob, MD;  Location: [place];  Service: Orthopedics;  Laterality: Left;  . FOOT SURGERY Bilateral   . FRACTURE SURGERY     broken ankle  . MEDIAL PARTIAL KNEE  REPLACEMENT Left    done at [place] Right 12/28/2014   Procedure: REVERSE SHOULDER ARTHROPLASTY;  Surgeon: Nita Sells, MD;  Location: [place];  Service: Orthopedics;  Laterality: Right;  Right reverse total shoulder arthroplasty  . REVERSE TOTAL SHOULDER ARTHROPLASTY Right 12/28/2014   DR CHANDLER  . rotator cufff    . THYROIDECTOMY, PARTIAL     one lobe removed  . VARICOSE VEIN SURGERY Right     Family History  Problem Relation Age of Onset  . Heart disease Mother   . Atrial fibrillation Mother   . Cancer - Other Father     Social History   Socioeconomic History  . Marital status: Married    Spouse name: Not on file  . Number of children: Not on file  . Years of education: Not on file  . Highest education level: Not on file  Occupational History  . Occupation: retired    Comment: Therapist, sports  Tobacco Use  . Smoking status: Never Smoker  . Smokeless tobacco: Never Used  Substance and Sexual Activity  . Alcohol use: Yes    Comment: rarely  . Drug use: No  . Sexual activity: Not on file  Other Topics Concern  . Not on file  Social History Narrative   Married   [place] 2  Lives in 1 story ranch in [place] and also has mountain home in [place] with stairs   Social Determinants of Health   Financial Resource Strain: Low Risk   . Difficulty of Paying Living Expenses: Not very hard  Food Insecurity: No Food Insecurity  . Worried About Charity fundraiser in the Last Year: Never true  . Ran Out of Food in the Last Year: Never true  Transportation Needs: No Transportation Needs  . Lack of Transportation (Medical): No  . Lack of Transportation (Non-Medical): No  Physical Activity: Insufficiently Active  . Days of Exercise per Week: 4 days  . Minutes of Exercise per Session: 30 min  Stress: Stress Concern Present  . Feeling of Stress : Rather much  Social Connections: Socially Integrated  . Frequency of Communication with Friends  and Family: More than three times a week  . Frequency of Social Gatherings with Friends and Family: Twice a week  . Attends Religious Services: More than 4 times per year  . Active Member of Clubs or Organizations: Yes  . Attends Archivist Meetings: More than 4 times per year  . Marital Status: Married  Human resources officer Violence:   . Fear of Current or Ex-Partner: Not on file  . Emotionally Abused: Not on file  . Physically Abused: Not on file  . Sexually Abused: Not on file    Outpatient Medications Prior to Visit  Medication Sig Dispense Refill  . acetaminophen (TYLENOL) 500 MG tablet Take 500 mg by mouth every 4 (four) hours as needed.    Marland Kitchen amLODipine (NORVASC) 5 MG tablet TAKE 1 TABLET BY MOUTH DAILY. 90 tablet 1  . amoxicillin (AMOXIL) 500 MG capsule SMARTSIG:4 Capsule(s) By Mouth    . b complex vitamins capsule Take 1 capsule by mouth daily.    . Calcium Carb-Cholecalciferol 600-200 MG-UNIT TABS Take 1 tablet by mouth daily.     . cephALEXin (KEFLEX) 250 MG capsule Take 250 mg by mouth daily.    . cholecalciferol (VITAMIN D) 1000 UNITS tablet Take 2,000 Units by mouth daily.    Marland Kitchen docusate sodium (COLACE) 100 MG capsule Take 100 mg by mouth daily.    . ferrous sulfate 325 (65 FE) MG tablet Take 325 mg by mouth every other day.     . gabapentin (NEURONTIN) 300 MG capsule Take 600-900 mg by mouth See admin instructions. Take 300 mg every morning and900 mg at bedtime    . HYDROcodone-acetaminophen (NORCO/VICODIN) 5-325 MG tablet Take 1 tablet by mouth every 4 (four) hours as needed for moderate pain or severe pain.     Marland Kitchen ipratropium (ATROVENT) 0.06 % nasal spray PLACE 2 SPRAYS IN EACH NOSTRIL 3 TIMES A DAY 15 mL 6  . levothyroxine (SYNTHROID) 137 MCG tablet TAKE 1 TABLET (137 MCG TOTAL) BY MOUTH DAILY BEFORE BREAKFAST. 90 tablet 1  . loratadine-pseudoephedrine (CLARITIN-D 12-HOUR) 5-120 MG per tablet Take 1 tablet by mouth daily.    . methocarbamol (ROBAXIN) 750 MG tablet  Take 750 mg by mouth 2 (two) times daily as needed for muscle spasms.     . metoprolol succinate (TOPROL-XL) 50 MG 24 hr tablet TAKE 1 TABLET BY MOUTH 2 TIMES DAILY. 180 tablet 1  . nitrofurantoin, macrocrystal-monohydrate, (MACROBID) 100 MG capsule Take 100 mg by mouth daily.     Marland Kitchen olmesartan (BENICAR) 40 MG tablet TAKE 1 TABLET BY MOUTH DAILY. 90 tablet 1  . Soybean-Soy Isoflavones (SOY BALANCE PO) Take 1 tablet by mouth daily.  Soy bean extract    . traZODone (DESYREL) 50 MG tablet Take 50 mg by mouth as needed.    . zolpidem (AMBIEN) 5 MG tablet Take 1 tablet (5 mg total) by mouth at bedtime. 90 tablet 0  . levothyroxine (SYNTHROID) 125 MCG tablet Take by mouth.    Marland Kitchen MYRBETRIQ 50 MG TB24 tablet Take 50 mg by mouth at bedtime.     No facility-administered medications prior to visit.    Allergies  Allergen Reactions  . Nsaids Other (See Comments)     BRUISES EASILY Other Reaction: BRUISES EASILY Other Reaction: BRUISES EASILY   . Oxycodone-Acetaminophen Other (See Comments)     VERY CONSTIPATING, does not provide pain relief  . Aspirin Other (See Comments)    Bruising   . Augmentin [Amoxicillin-Pot Clavulanate] Other (See Comments)    headache  . Ciprocin-Fluocin-Procin [Fluocinolone]   . Ciprofloxacin Hcl     tendonitis  . Fentanyl Other (See Comments)    Does not work  . Fluclorolone Other (See Comments)  . Lyrica [Pregabalin] Other (See Comments)    dysphoria  . Oxycodone Other (See Comments)    Does not work  . Versed [Midazolam] Other (See Comments)    Does not work  . Diclofenac Sodium Other (See Comments) and Rash    Skin peeling. Voltaren gel-rash and skin peeling   . Hydromorphone Hcl Itching     Itching with large doses - able to tolerate lower doses or shorter duration  . Voltaren Gel [Diclofenac Sodium] Rash and Other (See Comments)    Skin peeling.    ROS Review of Systems  Constitutional: Negative for chills, fatigue, fever and unexpected weight  change.  Eyes: Negative for visual disturbance.  Respiratory: Positive for wheezing. Negative for cough, chest tightness and shortness of breath.   Cardiovascular: Positive for leg swelling. Negative for chest pain and palpitations.  Neurological: Negative for dizziness, seizures, syncope, weakness, light-headedness and headaches.      Objective:    Physical Exam Vitals reviewed.  Constitutional:      Appearance: Normal appearance.  Cardiovascular:     Rate and Rhythm: Normal rate and regular rhythm.  Pulmonary:     Effort: Pulmonary effort is normal.     Breath sounds: Normal breath sounds. No wheezing or rales.  Musculoskeletal:     Comments: She has some edema right lower extremity related to her chronic lymphedema.  She has compression wrap in place.  Neurological:     Mental Status: She is alert.     BP 122/76 (BP Location: Left Arm, Patient Position: Sitting, Cuff Size: Normal)   Pulse 69   Temp 98.6 F (37 C) (Oral)   Ht 5\' 1"  (1.549 m)   Wt 157 lb 8 oz (71.4 kg)   SpO2 96%   BMI 29.76 kg/m  Wt Readings from Last 3 Encounters:  10/05/20 157 lb 8 oz (71.4 kg)  07/26/20 161 lb 3.2 oz (73.1 kg)  04/26/20 155 lb (70.3 kg)     Health Maintenance Due  Topic Date Due  . DEXA SCAN  Never done    There are no preventive care reminders to display for this patient.  Lab Results  Component Value Date   TSH 3.81 07/26/2020   Lab Results  Component Value Date   WBC 7.0 04/03/2020   HGB 12.1 04/03/2020   HCT 35.4 (L) 04/03/2020   MCV 103.2 (H) 04/03/2020   PLT 325.0 04/03/2020   Lab Results  Component Value  Date   NA 136 04/03/2020   K 5.1 04/03/2020   CO2 29 04/03/2020   GLUCOSE 100 (H) 04/03/2020   BUN 27 (H) 04/03/2020   CREATININE 0.89 04/03/2020   BILITOT 0.7 04/28/2019   ALKPHOS 50 04/28/2019   AST 19 04/28/2019   ALT 14 04/28/2019   PROT 6.0 (L) 04/28/2019   ALBUMIN 3.3 (L) 04/28/2019   CALCIUM 9.1 04/03/2020   ANIONGAP 9 04/29/2019   GFR  60.85 04/03/2020   Lab Results  Component Value Date   CHOL 177 12/05/2009   Lab Results  Component Value Date   HDL 46 12/05/2009   Lab Results  Component Value Date   LDLCALC 89 12/05/2009   Lab Results  Component Value Date   TRIG 210 (H) 12/05/2009   Lab Results  Component Value Date   CHOLHDL 3.8 Ratio 12/05/2009   No results found for: HGBA1C    Assessment & Plan:   #1 hypertension stable and at goal -Continue current medications  #2 hypothyroidism stable by recent labs -Continue levothyroxine 137 mcg daily.  Check thyroid again at follow-up in 6 months  #3 history of liposarcoma involving her right thigh. -She is getting regular follow-up imaging at [facility] and has scheduled MRI soon.  Chest x-rays that show no signs of recurrence in the chest area.  #4 reports of intermittent wheezing.  Normal exam at this time.  If progressing consider steroid inhaler  No orders of the defined types were placed in this encounter.   Follow-up: Return in about 6 months (around 04/04/2021).    [name], MD

## 2020-10-05 NOTE — Patient Instructions (Signed)

## 2020-10-08 ENCOUNTER — Encounter: Payer: Self-pay | Admitting: Family Medicine

## 2020-10-08 DIAGNOSIS — Z1211 Encounter for screening for malignant neoplasm of colon: Secondary | ICD-10-CM

## 2020-10-25 ENCOUNTER — Other Ambulatory Visit: Payer: Self-pay

## 2020-10-25 ENCOUNTER — Ambulatory Visit: Payer: Medicare Other | Attending: Radiation Oncology | Admitting: Rehabilitation

## 2020-10-25 ENCOUNTER — Encounter: Payer: Self-pay | Admitting: Rehabilitation

## 2020-10-25 DIAGNOSIS — R2689 Other abnormalities of gait and mobility: Secondary | ICD-10-CM | POA: Insufficient documentation

## 2020-10-25 DIAGNOSIS — M6281 Muscle weakness (generalized): Secondary | ICD-10-CM | POA: Diagnosis present

## 2020-10-25 DIAGNOSIS — I890 Lymphedema, not elsewhere classified: Secondary | ICD-10-CM | POA: Diagnosis present

## 2020-10-25 NOTE — Therapy (Signed)
[place], [place], [address] Phone: [phone]   Fax:  [phone]  Physical Therapy Treatment  Patient Details  Name: Destiny [name] MRN: [id number] Date of Birth: [birth year] Referring Provider (PT): Dr. Gery Pray   Encounter Date: 10/25/2020   PT End of Session - 10/25/20 1306    Visit Number 15    Number of Visits 20    Date for PT Re-Evaluation 12/20/20    PT Start Time 1000    PT Stop Time 1054    PT Time Calculation (min) 54 min    Activity Tolerance Patient tolerated treatment well    Behavior During Therapy [facility] for tasks assessed/performed           Past Medical History:  Diagnosis Date  . Anxiety   . Arthritis   . Colon polyps   . Complication of anesthesia    BP drops with fentyl and versed  . Hepatitis    hep A  1976  . Hypertension   . Hypothyroidism   . Liposarcoma of right thigh ([place]) 03/2019    Past Surgical History:  Procedure Laterality Date  . ABDOMINAL HYSTERECTOMY    . APPENDECTOMY    . BACK SURGERY      fusion  . CESAREAN SECTION     times 2  . EYE SURGERY Left    cataract removed  . FINGER ARTHRODESIS Left 10/02/2017   Procedure: LEFT LONG FINGER ARTHRODESIS;  Surgeon: Milly Jakob, MD;  Location: [place];  Service: Orthopedics;  Laterality: Left;  . FOOT SURGERY Bilateral   . FRACTURE SURGERY     broken ankle  . MEDIAL PARTIAL KNEE REPLACEMENT Left    done at [place] Right 12/28/2014   Procedure: REVERSE SHOULDER ARTHROPLASTY;  Surgeon: Nita Sells, MD;  Location: [place];  Service: Orthopedics;  Laterality: Right;  Right reverse total shoulder arthroplasty  . REVERSE TOTAL SHOULDER ARTHROPLASTY Right 12/28/2014   DR CHANDLER  . rotator cufff    . THYROIDECTOMY, PARTIAL     one lobe removed  . VARICOSE VEIN SURGERY Right     There were no vitals filed for this visit.    Subjective Assessment - 10/25/20 1000    Subjective I need to get measurements to flexitouch.  I have been using the machine every day and I think it stays stuck in the thigh now.    Pertinent History Liposarcoma of the Rt thigh with sartorious removed 11.5 cm intramuscular mass. Radical resection performed 04/15/19. Finished radiation 08/12/2019    Currently in Pain? No/denies                 LYMPHEDEMA/ONCOLOGY QUESTIONNAIRE - 10/25/20 0001      Right Lower Extremity Lymphedema   30 cm Proximal to Suprapatella 61 cm   26cm   20 cm Proximal to Suprapatella 56.5 cm    10 cm Proximal to Suprapatella 52 cm    At Midpatella/Popliteal Crease 41.8 cm    30 cm Proximal to Floor at Lateral Plantar Foot 38.9 cm    20 cm Proximal to Floor at Lateral Plantar Foot 32.'4 1    10 ' cm Proximal to Floor at Lateral Malleoli 25.2 cm    5 cm Proximal to 1st MTP Joint 21.7 cm    Across MTP Joint 22 cm    Around Proximal Great Toe 7.9 cm    Other 644.0   umbilicus  [facility]  Lymphedema Life Impact Scale Total Score 25 %            [facility] Adult PT Treatment/Exercise - 10/25/20 0001      Manual Therapy   Manual therapy comments remeasured leg for today to send over to flexitouch.  overall some increased soft edema to the lower leg with increased measurements remaining pretty consistent in the upper leg.      Edema Management Discussed current garments with pt happy with velcro garment but with trouble having it slide down.  Gave pt piece of gray foam to try as well as reminded her about compression shorts.  Also went over tribute night garment or bandaging over tribute type pad.  Also gave pt ordering informtation for tg cotton or rolyan cotton                    PT Short Term Goals - 02/16/20 1140      PT SHORT TERM GOAL #1   Title Pt and spouse will be independent with self MLD and HEP for  the R hip within 2 weeks in order to demonstrate autonomy of care.    Baseline pt does not perform exercises for the R hip, she walks and does not know how to perform MLD    Time 2    Period Weeks    Status New    Target Date 03/08/20             PT Long Term Goals - 10/25/20 1312      PT LONG TERM GOAL #1   Title Pt will be ind with compression pump and/or self MLD for Rt LE lymphedema    Status Partially Met      PT LONG TERM GOAL #2   Title Pt will be ind with self bandaging or use of compression for the LE    Status Achieved                 Plan - 10/25/20 1307    Clinical Impression Statement Pt returns after using basic flexitouch pump for the Rt LE for about 6 weeks.  Pt overall enjoys the pump and feels it works very well for the lower leg but that the edema tends to pool in the proximal thigh.  Due to extensive scar tissue on the proximal medial thigh and fibrosis/hyperplasia here she would benefit from an upgraded flexitouch LE pump to address proximal trunk clearance and edema moving proximally towards the trunk.  The flexitouch will also help prevent increased fibrosis to the medial proximal thigh and ankle region where the patient has trouble reaching.    Personal Factors and Comorbidities Age;Fitness;Behavior Pattern;Comorbidity 3+;Social Background    Comorbidities surgical resection, radiation, OA causing knee pain, priorities    Stability/Clinical Decision Making Stable/Uncomplicated    PT Frequency 2x / week    PT Duration 4 weeks    PT Treatment/Interventions Therapeutic exercise;Therapeutic activities;Neuromuscular re-education;Manual techniques    PT Home Exercise Plan how was upgraded pump? measure and monitor    Consulted and Agree with Plan of Care Patient           Patient will benefit from skilled therapeutic intervention in order to improve the following deficits and impairments:  Decreased strength, Increased edema  Visit  Diagnosis: Lymphedema  Other abnormalities of gait and mobility  Muscle weakness (generalized)     Problem List Patient Active Problem List  Diagnosis Date Noted  . Lymphedema of right lower extremity 10/05/2020  . Essential tremor 10/05/2019  . Near syncope 04/28/2019  . Adjustment disorder with mixed anxiety and depressed mood 04/28/2019  . Liposarcoma of right thigh ([place]) 04/28/2019  . Osteoarthritis of right knee 03/10/2019  . Hip pain, bilateral 01/07/2018  . Sinus tarsi syndrome 08/20/2016  . Right knee pain 07/17/2016  . S/p reverse total shoulder arthroplasty 12/28/2014  . Abnormality of gait 04/25/2014  . Foot pain 06/03/2011  . Metatarsalgia 06/03/2011  . Chronic pain syndrome 12/05/2009  . Chronic insomnia 08/15/2009  . INGROWN TOENAIL 06/06/2009  . LEG PAIN, BILATERAL 06/05/2009  . OTHER VITAMIN B12 DEFICIENCY ANEMIA 04/04/2009  . MALAISE AND FATIGUE 02/28/2009  . Hypothyroidism 07/26/2008  . HYPERLIPIDEMIA 11/05/2007  . NEUROPATHY, IDIOPATHIC PERIPHERAL NEC 09/16/2007  . Essential hypertension 01/28/2007  . Arthritis of hand, degenerative 01/28/2007  . ROTATOR CUFF INJURY, RIGHT SHOULDER 01/28/2007  . OSTEOPENIA 01/28/2007    Stark Bray 10/25/2020, 1:13 PM  [place], [place], [address] Phone: [phone]   Fax:  [phone]  Name: Armanii Urbanik MRN: [id number] Date of Birth: [birth year]

## 2020-10-31 LAB — COLOGUARD: COLOGUARD: POSITIVE — AB

## 2020-11-05 ENCOUNTER — Telehealth: Payer: Self-pay | Admitting: Family Medicine

## 2020-11-05 ENCOUNTER — Other Ambulatory Visit: Payer: Self-pay | Admitting: Family Medicine

## 2020-11-05 DIAGNOSIS — R195 Other fecal abnormalities: Secondary | ICD-10-CM

## 2020-11-05 NOTE — Telephone Encounter (Signed)
Positive Cologuard results reviewed with patient.  Will set up GI referral.   Her main concern is prior difficulties with adequate sedation for prior colonoscopies.

## 2020-11-10 ENCOUNTER — Encounter: Payer: Self-pay | Admitting: Family Medicine

## 2020-11-27 ENCOUNTER — Encounter: Payer: Self-pay | Admitting: Rehabilitation

## 2020-11-27 ENCOUNTER — Ambulatory Visit: Payer: Medicare Other | Attending: Radiation Oncology | Admitting: Rehabilitation

## 2020-11-27 ENCOUNTER — Other Ambulatory Visit: Payer: Self-pay

## 2020-11-27 DIAGNOSIS — R2689 Other abnormalities of gait and mobility: Secondary | ICD-10-CM | POA: Insufficient documentation

## 2020-11-27 DIAGNOSIS — M6281 Muscle weakness (generalized): Secondary | ICD-10-CM

## 2020-11-27 DIAGNOSIS — I890 Lymphedema, not elsewhere classified: Secondary | ICD-10-CM | POA: Diagnosis not present

## 2020-11-27 NOTE — Therapy (Signed)
[place], [place], [address] Phone: [phone]   Fax:  [phone]  Physical Therapy Treatment  Patient Details  Name: Destiny [name] MRN: [id number] Date of Birth: Feb 16, 1941 Referring Provider (PT): Dr. Gery Pray   Encounter Date: 11/27/2020   PT End of Session - 11/27/20 1124    Visit Number 16    Number of Visits 20    Date for PT Re-Evaluation 02/25/21    PT Start Time 1000    PT Stop Time 1053    PT Time Calculation (min) 53 min    Activity Tolerance Patient tolerated treatment well    Behavior During Therapy [facility] for tasks assessed/performed           Past Medical History:  Diagnosis Date  . Anxiety   . Arthritis   . Colon polyps   . Complication of anesthesia    BP drops with fentyl and versed  . Hepatitis    hep A  1976  . Hypertension   . Hypothyroidism   . Liposarcoma of right thigh ([place]) 03/2019    Past Surgical History:  Procedure Laterality Date  . ABDOMINAL HYSTERECTOMY    . APPENDECTOMY    . BACK SURGERY      fusion  . CESAREAN SECTION     times 2  . EYE SURGERY Left    cataract removed  . FINGER ARTHRODESIS Left 10/02/2017   Procedure: LEFT LONG FINGER ARTHRODESIS;  Surgeon: Milly Jakob, MD;  Location: [place];  Service: Orthopedics;  Laterality: Left;  . FOOT SURGERY Bilateral   . FRACTURE SURGERY     broken ankle  . MEDIAL PARTIAL KNEE REPLACEMENT Left    done at [place] Right 12/28/2014   Procedure: REVERSE SHOULDER ARTHROPLASTY;  Surgeon: Nita Sells, MD;  Location: [place];  Service: Orthopedics;  Laterality: Right;  Right reverse total shoulder arthroplasty  . REVERSE TOTAL SHOULDER ARTHROPLASTY Right 12/28/2014   DR CHANDLER  . rotator cufff    . THYROIDECTOMY, PARTIAL     one lobe removed  . VARICOSE VEIN SURGERY Right     There were no vitals filed for this visit.    Subjective Assessment - 11/27/20 1001    Subjective I go back the end of January for new CT but i like the new machine.  I have some questions.    Pertinent History Liposarcoma of the Rt thigh with sartorious removed 11.5 cm intramuscular mass. Radical resection performed 04/15/19. Finished radiation 08/12/2019    Currently in Pain? No/denies                 LYMPHEDEMA/ONCOLOGY QUESTIONNAIRE - 11/27/20 0001      Right Lower Extremity Lymphedema   20 cm Proximal to Suprapatella 56.5 cm    10 cm Proximal to Suprapatella 43.4 cm    At Midpatella/Popliteal Crease 39.8 cm    30 cm Proximal to Floor at Lateral Plantar Foot 38.8 cm    20 cm Proximal to Floor at Lateral Plantar Foot 33._0 cm Proximal to Floor at Lateral Malleoli 26.5 cm    Across MTP Joint 21.5 cm    Around Proximal Great Toe 7.5 cm              Flowsheet Row Outpatient Re[facility] from [facility]16/[facility]  Lymphedema Life Impact Scale Total Score 25 %  [place] Adult PT Treatment/Exercise - 11/27/20 0001      Manual Therapy   Edema Management remeasured after 3 weeks of new pump use including the trunk piece.  Email sent to Weston Settle at [facility] regarding increase in size of the lower leg but overall overthing else is improved since addition of the trunk piece.  Reviewed current thigh high which is a circular knit Juzo which digs into pts thigh and causes pain.  pt was given information on exostrong flat knit size medium average and was also given information on tribute leg size medium regular for both.  Helped pt donn ready wrap upon leaving.                    PT Short Term Goals - 02/16/20 1140      PT SHORT TERM GOAL #1   Title Pt and spouse will be independent with self MLD and HEP for the R hip within 2 weeks in order to demonstrate autonomy of care.    Baseline pt does not perform exercises for the R hip, she walks and does not know how  to perform MLD    Time 2    Period Weeks    Status New    Target Date 03/08/20             PT Long Term Goals - 11/27/20 1127      PT LONG TERM GOAL #1   Title Pt will be ind with compression pump and/or self MLD for Rt LE lymphedema    Baseline making changes to pump as needed    Status Partially Met      PT LONG TERM GOAL #2   Title Pt will be ind with self bandaging or use of compression for the LE    Status Achieved                 Plan - 11/27/20 1125    Clinical Impression Statement Pt is doing well with new advanced flexitouch except for increase in lower leg distal 2 measurements by around 1cm.  Knee and proximal thigh are much improved with less creasing evident in the thigh as well.  Pt was given information on ordering flat knit and/or tribute night garment as she has only been sleeping in tg soft upon questioning. Will keep up with monthly monitoring of status.    PT Frequency Monthy    PT Duration --   16 weeks   PT Treatment/Interventions Therapeutic exercise;Therapeutic activities;Neuromuscular re-education;Manual techniques    PT Next Visit Plan measure LE, any questions or changes?    Consulted and Agree with Plan of Care Patient           Patient will benefit from skilled therapeutic intervention in order to improve the following deficits and impairments:     Visit Diagnosis: Lymphedema  Other abnormalities of gait and mobility  Muscle weakness (generalized)     Problem List Patient Active Problem List   Diagnosis Date Noted  . Lymphedema of right lower extremity 10/05/2020  . Essential tremor 10/05/2019  . Near syncope 04/28/2019  . Adjustment disorder with mixed anxiety and depressed mood 04/28/2019  . Liposarcoma of right thigh ([place]) 04/28/2019  . Osteoarthritis of right knee 03/10/2019  . Hip pain, bilateral 01/07/2018  . Sinus tarsi syndrome 08/20/2016  . Right knee pain 07/17/2016  . S/p reverse total shoulder arthroplasty  12/28/2014  . Abnormality of gait 04/25/2014  . Foot pain 06/03/2011  .  Metatarsalgia 06/03/2011  . Chronic pain syndrome 12/05/2009  . Chronic insomnia 08/15/2009  . INGROWN TOENAIL 06/06/2009  . LEG PAIN, BILATERAL 06/05/2009  . OTHER VITAMIN B12 DEFICIENCY ANEMIA 04/04/2009  . MALAISE AND FATIGUE 02/28/2009  . Hypothyroidism 07/26/2008  . HYPERLIPIDEMIA 11/05/2007  . NEUROPATHY, IDIOPATHIC PERIPHERAL NEC 09/16/2007  . Essential hypertension 01/28/2007  . Arthritis of hand, degenerative 01/28/2007  . ROTATOR CUFF INJURY, RIGHT SHOULDER 01/28/2007  . OSTEOPENIA 01/28/2007    Stark Bray 11/27/2020, 11:28 AM  [place], [place], [address] Phone: [phone]   Fax:  [phone]  Name: Destiny [name] MRN: [id number] Date of Birth: 1940/11/27

## 2020-11-28 ENCOUNTER — Other Ambulatory Visit: Payer: Self-pay

## 2020-11-28 DIAGNOSIS — Z1211 Encounter for screening for malignant neoplasm of colon: Secondary | ICD-10-CM

## 2020-12-04 ENCOUNTER — Other Ambulatory Visit: Payer: Self-pay

## 2020-12-04 ENCOUNTER — Encounter: Payer: Self-pay | Admitting: Rehabilitation

## 2020-12-04 ENCOUNTER — Ambulatory Visit: Payer: Medicare Other | Admitting: Rehabilitation

## 2020-12-04 DIAGNOSIS — I890 Lymphedema, not elsewhere classified: Secondary | ICD-10-CM

## 2020-12-04 DIAGNOSIS — M6281 Muscle weakness (generalized): Secondary | ICD-10-CM

## 2020-12-04 DIAGNOSIS — R2689 Other abnormalities of gait and mobility: Secondary | ICD-10-CM

## 2020-12-04 NOTE — Therapy (Signed)
[place], [place], [address] Phone: [phone]   Fax:  [phone]  Physical Therapy Treatment  Patient Details  Name: Destiny [name] MRN: [id number] Date of Birth: 04/17/1941 Referring Provider (PT): Dr. Gery Pray   Encounter Date: 12/04/2020   PT End of Session - 12/04/20 0952    Visit Number 17    Number of Visits 20    Date for PT Re-Evaluation 02/25/21    PT Start Time 0902    PT Stop Time 0952    PT Time Calculation (min) 50 min    Activity Tolerance Patient tolerated treatment well    Behavior During Therapy [facility] for tasks assessed/performed           Past Medical History:  Diagnosis Date  . Anxiety   . Arthritis   . Colon polyps   . Complication of anesthesia    BP drops with fentyl and versed  . Hepatitis    hep A  1976  . Hypertension   . Hypothyroidism   . Liposarcoma of right thigh ([place]) 03/2019    Past Surgical History:  Procedure Laterality Date  . ABDOMINAL HYSTERECTOMY    . APPENDECTOMY    . BACK SURGERY      fusion  . CESAREAN SECTION     times 2  . EYE SURGERY Left    cataract removed  . FINGER ARTHRODESIS Left 10/02/2017   Procedure: LEFT LONG FINGER ARTHRODESIS;  Surgeon: Milly Jakob, MD;  Location: [place];  Service: Orthopedics;  Laterality: Left;  . FOOT SURGERY Bilateral   . FRACTURE SURGERY     broken ankle  . MEDIAL PARTIAL KNEE REPLACEMENT Left    done at [place] Right 12/28/2014   Procedure: REVERSE SHOULDER ARTHROPLASTY;  Surgeon: Nita Sells, MD;  Location: [place];  Service: Orthopedics;  Laterality: Right;  Right reverse total shoulder arthroplasty  . REVERSE TOTAL SHOULDER ARTHROPLASTY Right 12/28/2014   DR CHANDLER  . rotator cufff    . THYROIDECTOMY, PARTIAL     one lobe removed  . VARICOSE VEIN SURGERY Right     There were no vitals filed for this visit.    Subjective Assessment - 12/04/20 0916    Subjective I am here for the flexitouch check up    Pertinent History Liposarcoma of the Rt thigh with sartorious removed 11.5 cm intramuscular mass. Radical resection performed 04/15/19. Finished radiation 08/12/2019    Currently in Pain? No/denies                     Flowsheet Row [facility]patient [facility]  Lymphedema Life Impact Scale Total Score 25 %            [facility] Adult PT Treatment/Exercise - 12/04/20 0001      Manual Therapy   Manual therapy comments pt here to meet with Vicente Males and Ad[place]son regarding flexitouch troubleshooting x 51mn    Edema Management Talked to pt and husband x 276m about importance of compression and answering further questions                    PT Short Term Goals - 02/16/20 1140      PT SHORT TERM GOAL #1   Title Pt and spouse will be independent with self MLD and HEP for the R hip within 2 weeks in order to demonstrate autonomy of care.  Baseline pt does not perform exercises for the R hip, she walks and does not know how to perform MLD    Time 2    Period Weeks    Status New    Target Date 03/08/20             PT Long Term Goals - 11/27/20 1127      PT LONG TERM GOAL #1   Title Pt will be ind with compression pump and/or self MLD for Rt LE lymphedema    Baseline making changes to pump as needed    Status Partially Met      PT LONG TERM GOAL #2   Title Pt will be ind with self bandaging or use of compression for the LE    Status Achieved                 Plan - 12/04/20 0952    Clinical Impression Statement Pt here to trouble shoot flexitouch problems with increased lower leg edema. Vicente Males noticed that the thigh piece was pretty tight and the lower leg pretty loose so this was corrected and pt was instructed not to move velcro each time and to just slide it on.  measurements given to pt and husband and  instruction on self measurement.  Pt will come back in 2 weeks to recheck measurements for effectiveness    PT Frequency Monthy    PT Treatment/Interventions Therapeutic exercise;Therapeutic activities;Neuromuscular re-education;Manual techniques    PT Next Visit Plan measure LE, any questions or changes?    Consulted and Agree with Plan of Care Patient;Family member/caregiver    Family Member Consulted spouse           Patient will benefit from skilled therapeutic intervention in order to improve the following deficits and impairments:     Visit Diagnosis: Other abnormalities of gait and mobility  Muscle weakness (generalized)  Lymphedema     Problem List Patient Active Problem List   Diagnosis Date Noted  . Lymphedema of right lower extremity 10/05/2020  . Essential tremor 10/05/2019  . Near syncope 04/28/2019  . Adjustment disorder with mixed anxiety and depressed mood 04/28/2019  . Liposarcoma of right thigh ([place]) 04/28/2019  . Osteoarthritis of right knee 03/10/2019  . Hip pain, bilateral 01/07/2018  . Sinus tarsi syndrome 08/20/2016  . Right knee pain 07/17/2016  . S/p reverse total shoulder arthroplasty 12/28/2014  . Abnormality of gait 04/25/2014  . Foot pain 06/03/2011  . Metatarsalgia 06/03/2011  . Chronic pain syndrome 12/05/2009  . Chronic insomnia 08/15/2009  . INGROWN TOENAIL 06/06/2009  . LEG PAIN, BILATERAL 06/05/2009  . OTHER VITAMIN B12 DEFICIENCY ANEMIA 04/04/2009  . MALAISE AND FATIGUE 02/28/2009  . Hypothyroidism 07/26/2008  . HYPERLIPIDEMIA 11/05/2007  . NEUROPATHY, IDIOPATHIC PERIPHERAL NEC 09/16/2007  . Essential hypertension 01/28/2007  . Arthritis of hand, degenerative 01/28/2007  . ROTATOR CUFF INJURY, RIGHT SHOULDER 01/28/2007  . OSTEOPENIA 01/28/2007    Stark Bray 12/04/2020, 9:55 AM  [place], [place], [address] Phone: [phone]   Fax:   [phone]  Name: Destiny [name] MRN: [id number] Date of Birth: [birth year]

## 2020-12-11 NOTE — Telephone Encounter (Signed)
This encounter was created in error - please disregard.

## 2020-12-12 ENCOUNTER — Telehealth: Payer: Self-pay

## 2020-12-12 NOTE — Telephone Encounter (Signed)
Per patients insurance she is not eligible for another cologuard for 2 years.  Reference [id number].

## 2020-12-19 ENCOUNTER — Encounter: Payer: Self-pay | Admitting: Family Medicine

## 2020-12-20 ENCOUNTER — Ambulatory Visit: Payer: Medicare Other | Admitting: Rehabilitation

## 2020-12-20 ENCOUNTER — Encounter: Payer: Self-pay | Admitting: Rehabilitation

## 2020-12-20 ENCOUNTER — Other Ambulatory Visit: Payer: Self-pay

## 2020-12-20 DIAGNOSIS — I890 Lymphedema, not elsewhere classified: Secondary | ICD-10-CM

## 2020-12-20 NOTE — Therapy (Signed)
[facility] [address] [place], [place], [address] Phone: [phone]   Fax:  [phone]  Physical Therapy Treatment  Patient Details  Name: Destiny [name] MRN: [id number] Date of Birth: Feb 06, 1941 Referring Provider (PT): Dr. Antony Blackbird   Encounter Date: 12/20/2020   PT End of Session - 12/20/20 1742    Visit Number 18    Number of Visits 20    Date for PT Re-Evaluation 02/25/21    PT Start Time 1100    PT Stop Time 1156    PT Time Calculation (min) 56 min    Activity Tolerance Patient tolerated treatment well    Behavior During Therapy [facility] for tasks assessed/performed           Past Medical History:  Diagnosis Date  . Anxiety   . Arthritis   . Colon polyps   . Complication of anesthesia    BP drops with fentyl and versed  . Hepatitis    hep A  1976  . Hypertension   . Hypothyroidism   . Liposarcoma of right thigh ([facility]) 03/2019    Past Surgical History:  Procedure Laterality Date  . ABDOMINAL HYSTERECTOMY    . APPENDECTOMY    . BACK SURGERY      fusion  . CESAREAN SECTION     times 2  . EYE SURGERY Left    cataract removed  . FINGER ARTHRODESIS Left 10/02/2017   Procedure: LEFT LONG FINGER ARTHRODESIS;  Surgeon: Mack Hook, MD;  Location: [facility];  Service: Orthopedics;  Laterality: Left;  . FOOT SURGERY Bilateral   . FRACTURE SURGERY     broken ankle  . MEDIAL PARTIAL KNEE REPLACEMENT Left    done at [facility]  . REVERSE SHOULDER ARTHROPLASTY Right 12/28/2014   Procedure: REVERSE SHOULDER ARTHROPLASTY;  Surgeon: Mable Paris, MD;  Location: [facility];  Service: Orthopedics;  Laterality: Right;  Right reverse total shoulder arthroplasty  . REVERSE TOTAL SHOULDER ARTHROPLASTY Right 12/28/2014   DR CHANDLER  . rotator cufff    . THYROIDECTOMY, PARTIAL     one lobe removed  . VARICOSE VEIN SURGERY Right     There were no vitals filed for this visit.    Subjective Assessment - 12/20/20 1102    Subjective i got my new night garment.  i think that the adjustments to the pump have worked    Pertinent History Liposarcoma of the Rt thigh with sartorious removed 11.5 cm intramuscular mass. Radical resection performed 04/15/19. Finished radiation 08/12/2019    Currently in Pain? No/denies                 LYMPHEDEMA/ONCOLOGY QUESTIONNAIRE - 12/20/20 0001      Right Lower Extremity Lymphedema   20 cm Proximal to Suprapatella 54.5 cm    10 cm Proximal to Suprapatella 42.8 cm    At Midpatella/Popliteal Crease 39.8 cm    30 cm Proximal to Floor at Lateral Plantar Foot 38.4 cm    20 cm Proximal to Floor at Lateral Plantar Foot 31.5 1    10  cm Proximal to Floor at Lateral Malleoli 25.5 cm    Across MTP Joint 22 cm    Around Proximal Great Toe 7.8 cm              Flowsheet Row [facility] from [facility]/16/[facility]  Lymphedema Life Impact Scale Total Score 25 %  [place] Adult PT Treatment/Exercise - 12/20/20 0001      Manual Therapy   Manual therapy comments remeasured LE to assess flexitouch progress    Edema Management assessed fit of new tribute night wrap which fits well with instruction on donning and care.  pt also ordered a ready wrap foot SL but received a non SL version so she will contact them about replacement    Manual Lymphatic Drainage (MLD) Supine: Rt axillary nodes, Rt inguino-axillary anastomosis, Rt inguinal nodes, then Rt LE working from lateral upper thigh to dorsal foot working from proximal to distal retracing all steps.  Focus on more deep techniques around the lateral malleolus and ankle    Compression Bandaging helped pt donn ready wrap                    PT Short Term Goals - 02/16/20 1140      PT SHORT TERM GOAL #1   Title Pt and spouse will be independent with self MLD and HEP for the R hip within 2 weeks in order to demonstrate autonomy of  care.    Baseline pt does not perform exercises for the R hip, she walks and does not know how to perform MLD    Time 2    Period Weeks    Status New    Target Date 03/08/20             PT Long Term Goals - 12/20/20 1744      PT LONG TERM GOAL #1   Title Pt will be ind with compression pump and/or self MLD for Rt LE lymphedema    Status Achieved      PT LONG TERM GOAL #2   Title Pt will be ind with self bandaging or use of compression for the LE    Status Achieved                 Plan - 12/20/20 1742    Clinical Impression Statement Pt is doing well with her flexitouch changes with less edema noted in the lower leg on inspection and with measurements today.  Pt now has day and night garments, pump, and will attempt no visits until needed.           Patient will benefit from skilled therapeutic intervention in order to improve the following deficits and impairments:     Visit Diagnosis: Lymphedema     Problem List Patient Active Problem List   Diagnosis Date Noted  . Lymphedema of right lower extremity 10/05/2020  . Essential tremor 10/05/2019  . Near syncope 04/28/2019  . Adjustment disorder with mixed anxiety and depressed mood 04/28/2019  . Liposarcoma of right thigh ([place]) 04/28/2019  . Osteoarthritis of right knee 03/10/2019  . Hip pain, bilateral 01/07/2018  . Sinus tarsi syndrome 08/20/2016  . Right knee pain 07/17/2016  . S/p reverse total shoulder arthroplasty 12/28/2014  . Abnormality of gait 04/25/2014  . Foot pain 06/03/2011  . Metatarsalgia 06/03/2011  . Chronic pain syndrome 12/05/2009  . Chronic insomnia 08/15/2009  . INGROWN TOENAIL 06/06/2009  . LEG PAIN, BILATERAL 06/05/2009  . OTHER VITAMIN B12 DEFICIENCY ANEMIA 04/04/2009  . MALAISE AND FATIGUE 02/28/2009  . Hypothyroidism 07/26/2008  . HYPERLIPIDEMIA 11/05/2007  . NEUROPATHY, IDIOPATHIC PERIPHERAL NEC 09/16/2007  . Essential hypertension 01/28/2007  . Arthritis of hand,  degenerative 01/28/2007  . ROTATOR CUFF INJURY, RIGHT SHOULDER 01/28/2007  . OSTEOPENIA 01/28/2007    Shan Levans R 12/20/2020, 5:45 PM  [place], [place], [address] Phone: [phone]   Fax:  [phone]  Name: Bliss Behnke MRN: [id number] Date of Birth: [birth year]

## 2020-12-26 ENCOUNTER — Other Ambulatory Visit: Payer: Self-pay

## 2020-12-26 ENCOUNTER — Ambulatory Visit (INDEPENDENT_AMBULATORY_CARE_PROVIDER_SITE_OTHER): Payer: Medicare Other | Admitting: Family Medicine

## 2020-12-26 ENCOUNTER — Encounter: Payer: Self-pay | Admitting: Family Medicine

## 2020-12-26 VITALS — BP 128/70 | HR 66 | Ht 61.0 in | Wt 162.0 lb

## 2020-12-26 DIAGNOSIS — M1990 Unspecified osteoarthritis, unspecified site: Secondary | ICD-10-CM | POA: Insufficient documentation

## 2020-12-26 DIAGNOSIS — M8580 Other specified disorders of bone density and structure, unspecified site: Secondary | ICD-10-CM | POA: Insufficient documentation

## 2020-12-26 DIAGNOSIS — H9190 Unspecified hearing loss, unspecified ear: Secondary | ICD-10-CM | POA: Insufficient documentation

## 2020-12-26 DIAGNOSIS — Z85828 Personal history of other malignant neoplasm of skin: Secondary | ICD-10-CM | POA: Insufficient documentation

## 2020-12-26 DIAGNOSIS — H01134 Eczematous dermatitis of left upper eyelid: Secondary | ICD-10-CM | POA: Diagnosis not present

## 2020-12-26 DIAGNOSIS — H01131 Eczematous dermatitis of right upper eyelid: Secondary | ICD-10-CM | POA: Diagnosis not present

## 2020-12-26 DIAGNOSIS — K5900 Constipation, unspecified: Secondary | ICD-10-CM | POA: Insufficient documentation

## 2020-12-26 DIAGNOSIS — R600 Localized edema: Secondary | ICD-10-CM | POA: Insufficient documentation

## 2020-12-26 DIAGNOSIS — I7300 Raynaud's syndrome without gangrene: Secondary | ICD-10-CM | POA: Insufficient documentation

## 2020-12-26 DIAGNOSIS — E871 Hypo-osmolality and hyponatremia: Secondary | ICD-10-CM | POA: Insufficient documentation

## 2020-12-26 DIAGNOSIS — R252 Cramp and spasm: Secondary | ICD-10-CM | POA: Insufficient documentation

## 2020-12-26 DIAGNOSIS — R238 Other skin changes: Secondary | ICD-10-CM | POA: Insufficient documentation

## 2020-12-26 DIAGNOSIS — R233 Spontaneous ecchymoses: Secondary | ICD-10-CM | POA: Insufficient documentation

## 2020-12-26 DIAGNOSIS — K760 Fatty (change of) liver, not elsewhere classified: Secondary | ICD-10-CM | POA: Insufficient documentation

## 2020-12-26 DIAGNOSIS — Z8672 Personal history of thrombophlebitis: Secondary | ICD-10-CM | POA: Insufficient documentation

## 2020-12-26 DIAGNOSIS — F419 Anxiety disorder, unspecified: Secondary | ICD-10-CM | POA: Insufficient documentation

## 2020-12-26 DIAGNOSIS — R2240 Localized swelling, mass and lump, unspecified lower limb: Secondary | ICD-10-CM | POA: Insufficient documentation

## 2020-12-26 DIAGNOSIS — Z0001 Encounter for general adult medical examination with abnormal findings: Secondary | ICD-10-CM | POA: Insufficient documentation

## 2020-12-26 DIAGNOSIS — Z6827 Body mass index (BMI) 27.0-27.9, adult: Secondary | ICD-10-CM | POA: Insufficient documentation

## 2020-12-26 DIAGNOSIS — Z8481 Family history of carrier of genetic disease: Secondary | ICD-10-CM | POA: Insufficient documentation

## 2020-12-26 DIAGNOSIS — E890 Postprocedural hypothyroidism: Secondary | ICD-10-CM | POA: Insufficient documentation

## 2020-12-26 DIAGNOSIS — R259 Unspecified abnormal involuntary movements: Secondary | ICD-10-CM | POA: Insufficient documentation

## 2020-12-26 MED ORDER — TRIAMCINOLONE ACETONIDE 0.025 % EX CREA: 1.0000 "application " | TOPICAL_CREAM | Freq: Two times a day (BID) | CUTANEOUS | 1 refills | Status: DC

## 2020-12-26 NOTE — Patient Instructions (Signed)
Let me know in two weeks if eyelid symptoms not improved.

## 2020-12-26 NOTE — Progress Notes (Signed)
Established Patient Office Visit  Subjective:  Patient ID: Destiny [name], female    DOB: [birth year]  Age: 80 y.o. MRN: [id number]  CC:  Chief Complaint  Patient presents with  . Itchy Eye    HPI Destiny [name] presents for bilateral eyelid irritation and itching left greater than right for the past couple weeks.  She denies any change in make-up or any other chemical changes.  No change of soaps.  She has had some itching.  She uses Neutrogena in general for dryness this has not helped.  No matting.  No visual changes.  No significant drainage.  Past Medical History:  Diagnosis Date  . Anxiety   . Arthritis   . Colon polyps   . Complication of anesthesia    BP drops with fentyl and versed  . Hepatitis    hep A  1976  . Hypertension   . Hypothyroidism   . Liposarcoma of right thigh ([place]) 03/2019    Past Surgical History:  Procedure Laterality Date  . ABDOMINAL HYSTERECTOMY    . APPENDECTOMY    . BACK SURGERY      fusion  . CESAREAN SECTION     times 2  . EYE SURGERY Left    cataract removed  . FINGER ARTHRODESIS Left 10/02/2017   Procedure: LEFT LONG FINGER ARTHRODESIS;  Surgeon: Milly Jakob, MD;  Location: [place];  Service: Orthopedics;  Laterality: Left;  . FOOT SURGERY Bilateral   . FRACTURE SURGERY     broken ankle  . MEDIAL PARTIAL KNEE REPLACEMENT Left    done at [place] Right 12/28/2014   Procedure: REVERSE SHOULDER ARTHROPLASTY;  Surgeon: Nita Sells, MD;  Location: [place];  Service: Orthopedics;  Laterality: Right;  Right reverse total shoulder arthroplasty  . REVERSE TOTAL SHOULDER ARTHROPLASTY Right 12/28/2014   DR CHANDLER  . rotator cufff    . THYROIDECTOMY, PARTIAL     one lobe removed  . VARICOSE VEIN SURGERY Right     Family History  Problem Relation Age of Onset  . Heart disease Mother   . Atrial fibrillation Mother   . Cancer - Other Father     Social History    Socioeconomic History  . Marital status: Married    Spouse name: Not on file  . Number of children: Not on file  . Years of education: Not on file  . Highest education level: Not on file  Occupational History  . Occupation: retired    Comment: Therapist, sports  Tobacco Use  . Smoking status: Never Smoker  . Smokeless tobacco: Never Used  Substance and Sexual Activity  . Alcohol use: Yes    Comment: rarely  . Drug use: No  . Sexual activity: Not on file  Other Topics Concern  . Not on file  Social History Narrative   Married   [place] 2   Lives in 1 story ranch in [place] and also has mountain home in [place] with stairs   Social Determinants of Health   Financial Resource Strain: Low Risk   . Difficulty of Paying Living Expenses: Not very hard  Food Insecurity: No Food Insecurity  . Worried About Charity fundraiser in the Last Year: Never true  . Ran Out of Food in the Last Year: Never true  Transportation Needs: No Transportation Needs  . Lack of Transportation (Medical): No  . Lack of Transportation (Non-Medical): No  Physical Activity: Insufficiently Active  .  Days of Exercise per Week: 4 days  . Minutes of Exercise per Session: 30 min  Stress: Stress Concern Present  . Feeling of Stress : Rather much  Social Connections: Socially Integrated  . Frequency of Communication with Friends and Family: More than three times a week  . Frequency of Social Gatherings with Friends and Family: Twice a week  . Attends Religious Services: More than 4 times per year  . Active Member of Clubs or Organizations: Yes  . Attends Archivist Meetings: More than 4 times per year  . Marital Status: Married  Human resources officer Violence: Not on file    Outpatient Medications Prior to Visit  Medication Sig Dispense Refill  . acetaminophen (TYLENOL) 500 MG tablet Take 500 mg by mouth every 4 (four) hours as needed.    Marland Kitchen amLODipine (NORVASC) 5 MG tablet TAKE 1 TABLET BY MOUTH DAILY. 90  tablet 1  . amoxicillin (AMOXIL) 500 MG capsule SMARTSIG:4 Capsule(s) By Mouth    . b complex vitamins capsule Take 1 capsule by mouth daily.    . Calcium Carb-Cholecalciferol 600-200 MG-UNIT TABS Take 1 tablet by mouth daily.     . cephALEXin (KEFLEX) 250 MG capsule Take 250 mg by mouth daily.    . cholecalciferol (VITAMIN D) 1000 UNITS tablet Take 2,000 Units by mouth daily.    Marland Kitchen docusate sodium (COLACE) 100 MG capsule Take 100 mg by mouth daily.    . ferrous sulfate 325 (65 FE) MG tablet Take 325 mg by mouth every other day.     . gabapentin (NEURONTIN) 300 MG capsule Take 600-900 mg by mouth See admin instructions. Take 300 mg every morning and900 mg at bedtime    . HYDROcodone-acetaminophen (NORCO/VICODIN) 5-325 MG tablet Take 1 tablet by mouth every 4 (four) hours as needed for moderate pain or severe pain.     Marland Kitchen ipratropium (ATROVENT) 0.06 % nasal spray PLACE 2 SPRAYS IN EACH NOSTRIL 3 TIMES A DAY 15 mL 6  . levothyroxine (SYNTHROID) 137 MCG tablet TAKE 1 TABLET (137 MCG TOTAL) BY MOUTH DAILY BEFORE BREAKFAST. 90 tablet 1  . loratadine-pseudoephedrine (CLARITIN-D 12-HOUR) 5-120 MG per tablet Take 1 tablet by mouth daily.    . methocarbamol (ROBAXIN) 750 MG tablet Take 750 mg by mouth 2 (two) times daily as needed for muscle spasms.     . metoprolol succinate (TOPROL-XL) 50 MG 24 hr tablet TAKE 1 TABLET BY MOUTH 2 TIMES DAILY. 180 tablet 1  . nitrofurantoin, macrocrystal-monohydrate, (MACROBID) 100 MG capsule Take 100 mg by mouth daily.     Marland Kitchen olmesartan (BENICAR) 40 MG tablet TAKE 1 TABLET BY MOUTH DAILY. 90 tablet 1  . Soybean-Soy Isoflavones (SOY BALANCE PO) Take 1 tablet by mouth daily. Soy bean extract    . traZODone (DESYREL) 50 MG tablet Take 50 mg by mouth as needed.    . zolpidem (AMBIEN) 5 MG tablet TAKE 1 TABLET BY MOUTH ONCE DAILY AT BEDTIME 90 tablet 0   No facility-administered medications prior to visit.    Allergies  Allergen Reactions  . Nsaids Other (See Comments)      BRUISES EASILY Other Reaction: BRUISES EASILY Other Reaction: BRUISES EASILY   . Oxycodone-Acetaminophen Other (See Comments)     VERY CONSTIPATING, does not provide pain relief  . Aspirin Other (See Comments)    Bruising   . Augmentin [Amoxicillin-Pot Clavulanate] Other (See Comments)    headache  . Ciprocin-Fluocin-Procin [Fluocinolone]   . Ciprofloxacin Hcl  tendonitis  . Fentanyl Other (See Comments)    Does not work  . Fluclorolone Other (See Comments)  . Lyrica [Pregabalin] Other (See Comments)    dysphoria  . Oxycodone Other (See Comments)    Does not work  . Versed [Midazolam] Other (See Comments)    Does not work  . Diclofenac Sodium Other (See Comments) and Rash    Skin peeling. Voltaren gel-rash and skin peeling   . Hydromorphone Hcl Itching     Itching with large doses - able to tolerate lower doses or shorter duration  . Voltaren Gel [Diclofenac Sodium] Rash and Other (See Comments)    Skin peeling.    ROS Review of Systems  Eyes: Negative for visual disturbance.  Skin: Positive for rash.      Objective:    Physical Exam Vitals reviewed.  Constitutional:      Appearance: Normal appearance.  Skin:    Findings: Rash present.     Comments: Patient has some mild scaling and very mild erythema left eyelid greater than right.  Conjunctive appear normal.  Neurological:     Mental Status: She is alert.     BP 128/70   Pulse 66   Ht 5\' 1"  (1.549 m)   Wt 162 lb (73.5 kg)   SpO2 96%   BMI 30.61 kg/m  Wt Readings from Last 3 Encounters:  12/26/20 162 lb (73.5 kg)  10/05/20 157 lb 8 oz (71.4 kg)  07/26/20 161 lb 3.2 oz (73.1 kg)     Health Maintenance Due  Topic Date Due  . DEXA SCAN  Never done  . COVID-19 Vaccine (3 - Moderna risk 4-dose series) 02/02/2020    There are no preventive care reminders to display for this patient.  Lab Results  Component Value Date   TSH 3.81 07/26/2020   Lab Results  Component Value Date   WBC 7.0  04/03/2020   HGB 12.1 04/03/2020   HCT 35.4 (L) 04/03/2020   MCV 103.2 (H) 04/03/2020   PLT 325.0 04/03/2020   Lab Results  Component Value Date   NA 136 04/03/2020   K 5.1 04/03/2020   CO2 29 04/03/2020   GLUCOSE 100 (H) 04/03/2020   BUN 27 (H) 04/03/2020   CREATININE 0.89 04/03/2020   BILITOT 0.7 04/28/2019   ALKPHOS 50 04/28/2019   AST 19 04/28/2019   ALT 14 04/28/2019   PROT 6.0 (L) 04/28/2019   ALBUMIN 3.3 (L) 04/28/2019   CALCIUM 9.1 04/03/2020   ANIONGAP 9 04/29/2019   GFR 60.85 04/03/2020   Lab Results  Component Value Date   CHOL 177 12/05/2009   Lab Results  Component Value Date   HDL 46 12/05/2009   Lab Results  Component Value Date   LDLCALC 89 12/05/2009   Lab Results  Component Value Date   TRIG 210 (H) 12/05/2009   Lab Results  Component Value Date   CHOLHDL 3.8 Ratio 12/05/2009   No results found for: HGBA1C    Assessment & Plan:   Eczema rash involving upper eyelids  -Triamcinolone 0.025% cream to use once or twice daily as needed.  Avoid contact with eyes.  Use no more than 2 weeks continuously.  Be in touch if symptoms not resolving in 2 weeks  Meds ordered this encounter  Medications  . triamcinolone (KENALOG) 0.025 % cream    Sig: Apply 1 application topically 2 (two) times daily.    Dispense:  15 g    Refill:  1  Follow-up: No follow-ups on file.    [name], MD

## 2020-12-28 ENCOUNTER — Encounter: Payer: Self-pay | Admitting: *Deleted

## 2021-01-01 ENCOUNTER — Ambulatory Visit: Payer: Medicare Other | Admitting: Rehabilitation

## 2021-01-03 ENCOUNTER — Encounter: Payer: Self-pay | Admitting: Internal Medicine

## 2021-01-03 ENCOUNTER — Ambulatory Visit: Payer: Medicare Other | Admitting: Internal Medicine

## 2021-01-03 VITALS — BP 122/70 | HR 67 | Ht 61.0 in | Wt 161.8 lb

## 2021-01-03 DIAGNOSIS — R195 Other fecal abnormalities: Secondary | ICD-10-CM

## 2021-01-03 DIAGNOSIS — K5909 Other constipation: Secondary | ICD-10-CM | POA: Diagnosis not present

## 2021-01-03 MED ORDER — PLENVU 140 G PO SOLR: 1.0000 | ORAL | 0 refills | Status: DC

## 2021-01-03 NOTE — Progress Notes (Signed)
Patient ID: Destiny [name], female   DOB: 1941/07/19, 80 y.o.   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 Elease Hashimoto to evaluate a positive Cologuard test.  She is here today with her husband.  She reports that she has chronic constipation which she uses Colace daily as well as glycerin suppositories as needed.  For the most part this regimen works and she has bowel movements which are not necessarily daily but regular.  There are times where she will have hard stools and the need to strain.  She takes oral iron 3 times per week for history of anemia and she also takes hydrocodone 3 times a day under the direction of her pain management physician Dr. Hardin Negus for her arthritis.  She denies abdominal pain.  No weight loss.  No change in bowel habit.  No blood in stool or melena.  No upper GI or hepatobiliary complaint.  She had 1 colonoscopy which was performed by Dr. Olevia Perches on 10/06/2006.  This revealed internal hemorrhoids.  Dr. Olevia Perches noted some diffusely hyperemic colon mucosa and raise the question of portal hypertension.  Sedation for this procedure was with fentanyl 150 mcg and Versed 15 mg.  The patient reports that this procedure was very unpleasant and quite traumatic.  For this reason she has never wanted to have another colonoscopy.  She received fentanyl and Versed and had a paradoxical reaction.  She recalls being agitated and combative and needing to be restrained.   She is married with 2 adult children, her son is a Pharmacist, community.  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  Never tobacco user.  Average 1 alcoholic drink per day.  No family history of colon cancer.  There is a family history of breast cancer and  pancreatic cancer.  She recently had cross-sectional imaging performed at [facility] in the setting of her liposarcoma  Past Medical History:  Diagnosis Date  . Anxiety   . Arthritis   . Colon polyps   . Complication of anesthesia    BP drops with fentyl and versed  . Fatty liver   . Hepatic lesion   . Hepatitis    hep A  1976  . History of hepatitis A    1976  . Hypertension   . Hypothyroidism   . Internal hemorrhoids   . Liposarcoma of right thigh ([place]) 03/2019    Past Surgical History:  Procedure Laterality Date  . ABDOMINAL HYSTERECTOMY    . APPENDECTOMY    . BACK SURGERY      fusion  . CESAREAN SECTION     times 2  . COLONOSCOPY    . EYE SURGERY Left    cataract removed  . FINGER ARTHRODESIS Left 10/02/2017   Procedure: LEFT LONG FINGER ARTHRODESIS;  Surgeon: Milly Jakob, MD;  Location: [place];  Service: Orthopedics;  Laterality: Left;  . FOOT SURGERY Bilateral   . FRACTURE SURGERY     broken ankle  . MEDIAL PARTIAL KNEE REPLACEMENT Left    done at [place] Right 12/28/2014   Procedure: REVERSE SHOULDER ARTHROPLASTY;  Surgeon: Nita Sells, MD;  Location: [place];  Service: Orthopedics;  Laterality: Right;  Right reverse total shoulder arthroplasty  .  REVERSE TOTAL SHOULDER ARTHROPLASTY Right 12/28/2014   DR CHANDLER  . rotator cufff    . THYROIDECTOMY, PARTIAL     one lobe removed  . VARICOSE VEIN SURGERY Right     Outpatient Medications Prior to Visit  Medication Sig Dispense Refill  . acetaminophen (TYLENOL) 500 MG tablet Take 500 mg by mouth every 4 (four) hours as needed.    Marland Kitchen amLODipine (NORVASC) 5 MG tablet TAKE 1 TABLET BY MOUTH DAILY. 90 tablet 1  . b complex vitamins capsule Take 1 capsule by mouth daily.    . Calcium Carb-Cholecalciferol 600-200 MG-UNIT TABS Take 1 tablet by mouth daily.     . cephALEXin (KEFLEX) 250 MG capsule Take 250 mg by mouth daily.    . cholecalciferol  (VITAMIN D) 1000 UNITS tablet Take 2,000 Units by mouth daily.    Marland Kitchen docusate sodium (COLACE) 100 MG capsule Take 100 mg by mouth daily.    . ferrous sulfate 325 (65 FE) MG tablet Take 325 mg by mouth every other day.     . gabapentin (NEURONTIN) 300 MG capsule Take 600-900 mg by mouth See admin instructions. Take 300 mg every morning and900 mg at bedtime    . HYDROcodone-acetaminophen (NORCO/VICODIN) 5-325 MG tablet Take 1 tablet by mouth every 4 (four) hours as needed for moderate pain or severe pain.     Marland Kitchen ipratropium (ATROVENT) 0.06 % nasal spray PLACE 2 SPRAYS IN EACH NOSTRIL 3 TIMES A DAY 15 mL 6  . levothyroxine (SYNTHROID) 137 MCG tablet TAKE 1 TABLET (137 MCG TOTAL) BY MOUTH DAILY BEFORE BREAKFAST. 90 tablet 1  . loratadine-pseudoephedrine (CLARITIN-D 12-HOUR) 5-120 MG per tablet Take 1 tablet by mouth daily.    . methocarbamol (ROBAXIN) 750 MG tablet Take 750 mg by mouth 2 (two) times daily as needed for muscle spasms.     . metoprolol succinate (TOPROL-XL) 50 MG 24 hr tablet TAKE 1 TABLET BY MOUTH 2 TIMES DAILY. 180 tablet 1  . olmesartan (BENICAR) 40 MG tablet TAKE 1 TABLET BY MOUTH DAILY. 90 tablet 1  . Soybean-Soy Isoflavones (SOY BALANCE PO) Take 1 tablet by mouth daily. Soy bean extract    . traZODone (DESYREL) 50 MG tablet Take 50 mg by mouth as needed.    . triamcinolone (KENALOG) 0.025 % cream Apply 1 application topically 2 (two) times daily. 15 g 1  . zolpidem (AMBIEN) 5 MG tablet TAKE 1 TABLET BY MOUTH ONCE DAILY AT BEDTIME 90 tablet 0  . amoxicillin (AMOXIL) 500 MG capsule SMARTSIG:4 Capsule(s) By Mouth    . nitrofurantoin, macrocrystal-monohydrate, (MACROBID) 100 MG capsule Take 100 mg by mouth daily.      No facility-administered medications prior to visit.    Allergies  Allergen Reactions  . Nsaids Other (See Comments)     BRUISES EASILY Other Reaction: BRUISES EASILY Other Reaction: BRUISES EASILY   . Oxycodone-Acetaminophen Other (See Comments)     VERY  CONSTIPATING, does not provide pain relief  . Aspirin Other (See Comments)    Bruising   . Augmentin [Amoxicillin-Pot Clavulanate] Other (See Comments)    headache  . Ciprocin-Fluocin-Procin [Fluocinolone]   . Ciprofloxacin Hcl     tendonitis  . Fentanyl Other (See Comments)    Does not work  . Fluclorolone Other (See Comments)  . Lyrica [Pregabalin] Other (See Comments)    dysphoria  . Oxycodone Other (See Comments)    Does not work  . Versed [Midazolam] Other (See Comments)    Does not work  .  Diclofenac Sodium Other (See Comments) and Rash    Skin peeling. Voltaren gel-rash and skin peeling   . Hydromorphone Hcl Itching     Itching with large doses - able to tolerate lower doses or shorter duration  . Voltaren Gel [Diclofenac Sodium] Rash and Other (See Comments)    Skin peeling.    Family History  Problem Relation Age of Onset  . Heart disease Mother   . Atrial fibrillation Mother   . Cancer - Other Father   . Pancreatic cancer Father     Social History   Tobacco Use  . Smoking status: Never Smoker  . Smokeless tobacco: Never Used  Substance Use Topics  . Alcohol use: Yes    Comment: rarely  . Drug use: No    ROS: As per history of present illness, otherwise negative  BP 122/70   Pulse 67   Ht 5\' 1"  (1.549 m)   Wt 161 lb 12.8 oz (73.4 kg)   SpO2 97%   BMI 30.57 kg/m  Gen: awake, alert, NAD HEENT: anicteric  CV: RRR, no mrg Pulm: CTA b/l Abd: soft, NT/ND, +BS throughout Ext: no c/c/e Neuro: nonfocal  RELEVANT LABS AND IMAGING: CBC    Component Value Date/Time   WBC 7.0 04/03/2020 1119   RBC 3.43 (L) 04/03/2020 1119   HGB 12.1 04/03/2020 1119   HGB 12.0 07/21/2019 1125   HCT 35.4 (L) 04/03/2020 1119   PLT 325.0 04/03/2020 1119   PLT 313 07/21/2019 1125   MCV 103.2 (H) 04/03/2020 1119   MCH 33.0 07/21/2019 1125   MCHC 34.1 04/03/2020 1119   RDW 12.9 04/03/2020 1119   LYMPHSABS 1.1 04/03/2020 1119   MONOABS 0.6 04/03/2020 1119   EOSABS  0.2 04/03/2020 1119   BASOSABS 0.1 04/03/2020 1119    CMP     Component Value Date/Time   NA 136 04/03/2020 1119   K 5.1 04/03/2020 1119   CL 99 04/03/2020 1119   CO2 29 04/03/2020 1119   GLUCOSE 100 (H) 04/03/2020 1119   BUN 27 (H) 04/03/2020 1119   CREATININE 0.89 04/03/2020 1119   CALCIUM 9.1 04/03/2020 1119   PROT 6.0 (L) 04/28/2019 1500   ALBUMIN 3.3 (L) 04/28/2019 1500   AST 19 04/28/2019 1500   ALT 14 04/28/2019 1500   ALKPHOS 50 04/28/2019 1500   BILITOT 0.7 04/28/2019 1500   GFRNONAA >60 04/29/2019 0421   GFRAA >60 04/29/2019 0421   CT scan abdomen and pelvis without contrast: 12/18/2020  INDICATION: r/o mets \ C49.21 Soft tissue sarcoma of right thigh (HCC)  COMPARISON: None.   TECHNIQUE: Multislice axial images were obtained through the chest, abdomen, and pelvis without administration of iodinated intravenous contrast material. Multi-planar reformatted images were generated for additional analysis. Nongated technique limits cardiac detail.   All CT scans at [facility] are performed using radiation dose optimization techniques as appropriate to a performed exam, including but not limited to one or more of the following: automatic exposure control, adjustment of the mA and/or kV according to patient size, use of iterative reconstruction technique. In addition, our institution participates in a radiation dose monitoring program to optimize patient radiation exposure.   FINDINGS:  .  Vascular/Musculoskeletal: Prior L4-L5 fusion. Minimal L4-L5 grade 1 anterolisthesis. L2-L3 disc height loss, grade 1 retrolisthesis, with subsequent bilateral foraminal stenosis. Thoracic spine anterior bridging marginal osteophytes. Prior right shoulder arthroplasty changes, unremarkable at the shoulder.   Right anterior fourth, fifth  and sixth rib focal buckling, question old focal fractures. Moderate diffuse atherosclerotic  calcifications. Scattered reactive lymph nodes. Right inguinal surgical clips. Nonspecific groin lymph nodes visible bilaterally.   THORAX:  . Mediastinum: Approximately 28 x 23 x 13 mm oval mediastinal mass measuring approximately 12 Hounsfield units, likely representing a unusual pericardial cyst located cephalad to the left ventricle adjacent to the LAD. Diagnostic considerations would include an unusual LAD aneurysm as well as an unusual cystic low density lymph nodes etc. No other suspicious mass or adenopathy visible. Few scattered lymph nodes, likely reactive noted.   .  Lungs and Pleura: No suspicious pulmonary mass, effusion or pneumothorax visible.   ABDOMEN and PELVIS:  .  Liver/Spleen: Approximately 8 mm left lateral hepatic dome lucency, likely incidental cyst/hemangioma. Tiny right hepatic lobe lucency (axial series 2 image 117, coronal series 602 image 68) to small to characterize.   .  Gallbladder/Biliary/Pancreas: Within normal limits.   .  Kidneys/Ureters/Adrenals/Retroperitoneum: Tiny cortical partially exophytic lesions likely benign cortical cysts, too small to characterize. 20 mm exophytic right kidney lower pole probable benign cyst, and 2 anterior left kidney 12 mm similar lesion.   No adrenal mass seen. Scattered tiny retroperitoneal nodes, no dominant pathologic lymph node or coalescent lymphadenopathy visible.   .  Mesentery/Peritoneum/GI tract: Scattered lymph nodes, however no suspicious lymphadenopathy. No pathologic bowel dilation seen. Incidental diverticulosis. Appendix not independently visible, however no pathologic cystic structure seen in the right lower quadrant.   .  Bladder: Within normal limits.   .  Reproductive system: Prior hysterectomy. 26 mm low-density lesion in the right lateral pelvic wall, likely right ovary. Left ovary not discretely discernible.   CONCLUSION:   1. Prior right groin surgery changes, with scattered lymph nodes, no discrete  solitary enlarged or matted lymphadenopathy apparent.  2. Left anterior pericardial probable cystic lesion, likely pericardial cyst, although low-density mass and LAD aneurysm not completely excluded on this noncontrasted exam.  3. Significant degenerative changes, with prior lumbar spine surgery as above, no focal lytic osseous lesion visible.  4. Tiny hepatic lesions, nonspecific, given the location of the primary unlikely to represent metastatic disease however metastasis is not excluded. Imaging findings statistically favor benign lesions such as cyst/hemangioma.   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 Elease Hashimoto to evaluate a positive Cologuard test.  1.  Positive Cologuard --we spent time today discussing Cologuard including the characteristics of this test, how it works as well as the sensitivity and specificity.  I have recommended colonoscopy and also spent considerable time today discussing the risks of colonoscopy.  We discussed that the risk of complication does increase with age including the risk of sedation related complications and perforation.  I also made her aware that we are currently performing sedation for colonoscopy with propofol and not with fentanyl and Versed.  Her paradoxical reaction to moderate sedation is noted and I would not expect such a reaction to propofol.  She has had prior surgery and received propofol without issue during induction of anesthesia.  After discussing thoroughly the risks, benefits and alternatives as above she is agreeable and wishes to proceed with colonoscopy --Diagnostic colonoscopy in the [place] with propofol monitored anesthesia care to evaluate her positive Cologuard test  2.  Chronic constipation  --exacerbated by oral iron and  chronic narcotic use.  She will continue stool softener and as needed glycerin suppository --I have asked that she stop her oral iron 1 week prior to colonoscopy  3.  Liposarcoma --treated and surveyed at [facility]; recent cross-sectional imaging reviewed which did not show any concern in the colon or GI tract.  Diverticulosis was seen in the colon without diverticulitis.  There were tiny hepatic lesions which are most likely to be cysts but will be monitored on follow-up.      PN:[id number], Alinda Sierras, Md [address] [place],  [place] [id number]

## 2021-01-03 NOTE — Patient Instructions (Signed)
You have been scheduled for a colonoscopy. Please follow written instructions given to you at your visit today.  Please pick up your prep supplies at the pharmacy within the next 1-3 days. If you use inhalers (even only as needed), please bring them with you on the day of your procedure.  If you are age 82 or older, your body mass index should be between 23-30. Your Body mass index is 30.57 kg/m. If this is out of the aforementioned range listed, please consider follow up with your Primary Care Provider.  Due to recent changes in healthcare laws, you may see the results of your imaging and laboratory studies on MyChart before your provider has had a chance to review them.  We understand that in some cases there may be results that are confusing or concerning to you. Not all laboratory results come back in the same time frame and the provider may be waiting for multiple results in order to interpret others.  Please give [place] 48 hours in order for your provider to thoroughly review all the results before contacting the office for clarification of your results.

## 2021-01-07 ENCOUNTER — Telehealth: Payer: Self-pay | Admitting: Family Medicine

## 2021-01-07 NOTE — Telephone Encounter (Signed)
Spoke to patient to  schedule Medicare Annual Wellness Visit (AWV) either virtually or in office. Patient was driving and will call back to schedule    Last AWV 01/08/21  please schedule at anytime with [facility] Nurse Health Advisor 1 or 2   This should be a 45 minute visit.

## 2021-01-08 ENCOUNTER — Ambulatory Visit: Payer: Medicare Other

## 2021-01-10 ENCOUNTER — Telehealth: Payer: Self-pay | Admitting: Family Medicine

## 2021-01-10 NOTE — Progress Notes (Signed)
  Chronic Care Management   Note  01/10/2021 Name: Latiffany Harwick MRN: [id number] DOB: 02-Mar-1941  Charm Barges [place] is a 80 y.o. year old female who is a primary care patient of Burchette, Alinda Sierras, MD. I reached out to [place] by phone today in response to a referral sent by Ms. Charm Barges Brusca's PCP, Burchette, Alinda Sierras, MD.   Ms. Foisy was given information about Chronic Care Management services today including:  1. CCM service includes personalized support from designated clinical staff supervised by her physician, including individualized plan of care and coordination with other care providers 2. 24/7 contact phone numbers for assistance for urgent and routine care needs. 3. Service will only be billed when office clinical staff spend 20 minutes or more in a month to coordinate care. 4. Only one practitioner may furnish and bill the service in a calendar month. 5. The patient may stop CCM services at any time (effective at the end of the month) by phone call to the office staff.   Patient wishes to consider information provided and/or speak with a member of the care team before deciding about enrollment in care management services.   Follow up plan:   Carley Perdue [employer] Scheduler

## 2021-01-11 NOTE — Progress Notes (Signed)
Subjective:   Destiny [name] is a 80 y.o. female who presents for Medicare Annual (Subsequent) preventive examination.  I connected with Destiny [name] today by telephone and verified that I am speaking with the correct person using two identifiers. Location patient: home Location provider: work Persons participating in the virtual visit: patient, provider.   I discussed the limitations, risks, security and privacy concerns of performing an evaluation and management service by telephone and the availability of in person appointments. I also discussed with the patient that there may be a patient responsible charge related to this service. The patient expressed understanding and verbally consented to this telephonic visit.    Interactive audio and video telecommunications were attempted between this provider and patient, however failed, due to patient having technical difficulties OR patient did not have access to video capability.  We continued and completed visit with audio only.      Review of Systems    N/A  Cardiac Risk Factors include: advanced age (>37mn, >>11women);hypertension     Objective:    There were no vitals filed for this visit. There is no height or weight on file to calculate BMI.  Advanced Directives 01/15/2021 07/26/2020 02/16/2020 12/26/2019 09/26/2019 06/20/2019 06/13/2019  Does Patient Have a Medical Advance Directive? Yes Yes Yes Yes - Yes Yes  Type of Advance Directive HTown and CountryLiving will H[place]Living will Living will HColdironLiving will H[place]Living will Living will Living will  Does patient want to make changes to medical advance directive? - No - Guardian declined - - - - -  Copy of [place] Chart? No - copy requested - - No - copy requested - - -  Would patient like information on creating a medical advance directive? - - - - - - -    Current  Medications (verified) Outpatient Encounter Medications as of 01/15/2021  Medication Sig  . acetaminophen (TYLENOL) 500 MG tablet Take 500 mg by mouth every 4 (four) hours as needed.  .Marland KitchenamLODipine (NORVASC) 5 MG tablet TAKE 1 TABLET BY MOUTH DAILY.  .Marland Kitchenb complex vitamins capsule Take 1 capsule by mouth daily.  . Calcium Carb-Cholecalciferol 600-200 MG-UNIT TABS Take 1 tablet by mouth daily.   . cephALEXin (KEFLEX) 250 MG capsule Take 250 mg by mouth daily.  . cholecalciferol (VITAMIN D) 1000 UNITS tablet Take 2,000 Units by mouth daily.  .Marland Kitchendocusate sodium (COLACE) 100 MG capsule Take 100 mg by mouth daily.  .Marland Kitchengabapentin (NEURONTIN) 300 MG capsule Take 600-900 mg by mouth See admin instructions. Take 300 mg every morning and900 mg at bedtime  . HYDROcodone-acetaminophen (NORCO/VICODIN) 5-325 MG tablet Take 1 tablet by mouth every 4 (four) hours as needed for moderate pain or severe pain.   .Marland Kitchenipratropium (ATROVENT) 0.06 % nasal spray PLACE 2 SPRAYS IN EACH NOSTRIL 3 TIMES A DAY  . levothyroxine (SYNTHROID) 137 MCG tablet TAKE 1 TABLET (137 MCG TOTAL) BY MOUTH DAILY BEFORE BREAKFAST.  .Marland Kitchenloratadine-pseudoephedrine (CLARITIN-D 12-HOUR) 5-120 MG per tablet Take 1 tablet by mouth daily.  . metoprolol succinate (TOPROL-XL) 50 MG 24 hr tablet TAKE 1 TABLET BY MOUTH 2 TIMES DAILY.  .Marland Kitchenolmesartan (BENICAR) 40 MG tablet TAKE 1 TABLET BY MOUTH DAILY.  .Marland KitchenPEG-KCl-NaCl-NaSulf-Na Asc-C (PLENVU) 140 g SOLR Take 1 kit by mouth as directed. Use coupon: BIN: [id number]: CNRX Group: [id number]:: [id number] . Soybean-Soy Isoflavones (SOY BALANCE PO) Take 1 tablet by  mouth daily. Soy bean extract  . zolpidem (AMBIEN) 5 MG tablet TAKE 1 TABLET BY MOUTH ONCE DAILY AT BEDTIME  . [DISCONTINUED] traZODone (DESYREL) 50 MG tablet Take 50 mg by mouth as needed.  . ferrous sulfate 325 (65 FE) MG tablet Take 325 mg by mouth every other day.  (Patient not taking: Reported on 01/15/2021)  . methocarbamol (ROBAXIN) 750 MG tablet  Take 750 mg by mouth 2 (two) times daily as needed for muscle spasms.  (Patient not taking: Reported on 01/15/2021)  . triamcinolone (KENALOG) 0.025 % cream Apply 1 application topically 2 (two) times daily. (Patient not taking: Reported on 01/15/2021)   No facility-administered encounter medications on file as of 01/15/2021.    Allergies (verified) Nsaids, Oxycodone-acetaminophen, Aspirin, Augmentin [amoxicillin-pot clavulanate], Ciprocin-fluocin-procin [fluocinolone], Ciprofloxacin hcl, Fentanyl, Fluclorolone, Lyrica [pregabalin], Oxycodone, Versed [midazolam], Diclofenac sodium, Hydromorphone hcl, and Voltaren gel [diclofenac sodium]   History: Past Medical History:  Diagnosis Date  . Anxiety   . Arthritis   . Colon polyps   . Complication of anesthesia    BP drops with fentyl and versed  . Fatty liver   . Hepatic lesion   . Hepatitis    hep A  1976  . History of hepatitis A    1976  . Hypertension   . Hypothyroidism   . Internal hemorrhoids   . Liposarcoma of right thigh ([place]) 03/2019   Past Surgical History:  Procedure Laterality Date  . ABDOMINAL HYSTERECTOMY    . APPENDECTOMY    . BACK SURGERY      fusion  . CESAREAN SECTION     times 2  . COLONOSCOPY    . EYE SURGERY Left    cataract removed  . FINGER ARTHRODESIS Left 10/02/2017   Procedure: LEFT LONG FINGER ARTHRODESIS;  Surgeon: Milly Jakob, MD;  Location: [facility];  Service: Orthopedics;  Laterality: Left;  . FOOT SURGERY Bilateral   . FRACTURE SURGERY     broken ankle  . MEDIAL PARTIAL KNEE REPLACEMENT Left    done at [place] Right 12/28/2014   Procedure: REVERSE SHOULDER ARTHROPLASTY;  Surgeon: Nita Sells, MD;  Location: [facility];  Service: Orthopedics;  Laterality: Right;  Right reverse total shoulder arthroplasty  . REVERSE TOTAL SHOULDER ARTHROPLASTY Right 12/28/2014   DR CHANDLER  . rotator cufff    . THYROIDECTOMY, PARTIAL     one lobe  removed  . VARICOSE VEIN SURGERY Right    Family History  Problem Relation Age of Onset  . Heart disease Mother   . Atrial fibrillation Mother   . Cancer - Other Father   . Pancreatic cancer Father    Social History   Socioeconomic History  . Marital status: Married    Spouse name: Not on file  . Number of children: Not on file  . Years of education: Not on file  . Highest education level: Not on file  Occupational History  . Occupation: retired    Comment: Therapist, sports  Tobacco Use  . Smoking status: Never Smoker  . Smokeless tobacco: Never Used  Substance and Sexual Activity  . Alcohol use: Yes    Comment: rarely  . Drug use: No  . Sexual activity: Not Currently  Other Topics Concern  . Not on file  Social History Narrative   Married   [place] 2   Lives in 1 story ranch in [place] and also has mountain home in [place] with stairs   Social  Determinants of Health   Financial Resource Strain: Low Risk   . Difficulty of Paying Living Expenses: Not hard at all  Food Insecurity: No Food Insecurity  . Worried About Charity fundraiser in the Last Year: Never true  . Ran Out of Food in the Last Year: Never true  Transportation Needs: No Transportation Needs  . Lack of Transportation (Medical): No  . Lack of Transportation (Non-Medical): No  Physical Activity: Insufficiently Active  . Days of Exercise per Week: 3 days  . Minutes of Exercise per Session: 30 min  Stress: No Stress Concern Present  . Feeling of Stress : Not at all  Social Connections: Moderately Isolated  . Frequency of Communication with Friends and Family: Twice a week  . Frequency of Social Gatherings with Friends and Family: Once a week  . Attends Religious Services: Never  . Active Member of Clubs or Organizations: No  . Attends Archivist Meetings: Never  . Marital Status: Married    Tobacco Counseling Counseling given: Not Answered   Clinical Intake:  Pre-visit preparation completed:  Yes  Pain : No/denies pain     Nutritional Risks: None Diabetes: No  How often do you need to have someone help you when you read instructions, pamphlets, or other written materials from your doctor or pharmacy?: 1 - Never  Diabetic?No  Interpreter Needed?: No  Information entered by :: [facility] of Daily Living In your present state of health, do you have any difficulty performing the following activities: 01/15/2021  Hearing? Y  Comment has bilateral hearing aids  Vision? N  Difficulty concentrating or making decisions? N  Walking or climbing stairs? Y  Dressing or bathing? N  Doing errands, shopping? N  Preparing Food and eating ? N  Using the Toilet? N  In the past six months, have you accidently leaked urine? Y  Do you have problems with loss of bowel control? N  Managing your Medications? N  Managing your Finances? N  Housekeeping or managing your Housekeeping? N  Some recent data might be hidden    Patient Care Team: Eulas Post, MD as PCP - General (Family Medicine)  Indicate any recent Medical Services you may have received from other than [facility] providers in the past year (date may be approximate).     Assessment:   This is a routine wellness examination for Destiny [name].  Hearing/Vision screen  Hearing Screening   '125Hz'  '250Hz'  '500Hz'  '1000Hz'  '2000Hz'  '3000Hz'  '4000Hz'  '6000Hz'  '8000Hz'   Right ear:           Left ear:           Vision Screening Comments: Gets eyes examined once per year. Wears glasses   Dietary issues and exercise activities discussed: Current Exercise Habits: Home exercise routine, Type of exercise: walking, Time (Minutes): 30, Frequency (Times/Week): 3, Weekly Exercise (Minutes/Week): 90, Intensity: Mild  Goals    . Patient Stated     I would like to keep on keeping house and [place]     . transition to [place] goes well      Depression Screen PHQ 2/9 Scores 01/15/2021 01/09/2020  10/05/2019 01/13/2017 10/08/2016 08/20/2016 07/16/2016  PHQ - 2 Score 0 1 0 0 0 0 0  PHQ- 9 Score - - 2 - - - -    Fall Risk Fall Risk  01/15/2021 01/09/2020 01/13/2017 10/08/2016 08/20/2016  Falls in the past year? 0 0 No  No No  Number falls in past yr: 0 - - - -  Injury with Fall? 0 - - - -  Risk for fall due to : No Fall Risks Medication side effect;Orthopedic patient;Other (Comment) - - -  Risk for fall due to: Comment - recent cancer diagnosis in 2020 - - -  Follow up Falls evaluation completed;Falls prevention discussed Falls evaluation completed;Education provided;Falls prevention discussed - - -    FALL RISK PREVENTION PERTAINING TO THE HOME:  Any stairs in or around the home? No  If so, are there any without handrails? No  Home free of loose throw rugs in walkways, pet beds, electrical cords, etc? Yes  Adequate lighting in your home to reduce risk of falls? Yes   ASSISTIVE DEVICES UTILIZED TO PREVENT FALLS:  Life alert? No  Use of a cane, walker or w/c? No  Grab bars in the bathroom? Yes  Shower chair or bench in shower? No  Elevated toilet seat or a handicapped toilet? No     Cognitive Function:   Normal cognitive status assessed by direct observation by this Nurse Health Advisor. No abnormalities found.     6CIT Screen 01/09/2020  What Year? 0 points  What month? 0 points  What time? 0 points  Count back from 20 0 points  Months in reverse 0 points  Repeat phrase 0 points  Total Score 0    Immunizations Immunization History  Administered Date(s) Administered  . Fluad Quad(high Dose 65+) 07/29/2019  . Influenza Whole 09/08/2008  . Influenza, High Dose Seasonal PF 09/25/2014, 08/09/2015, 08/12/2016  . Influenza, Quadrivalent, Recombinant, Inj, Pf 08/19/2017, 08/26/2018  . Influenza-Unspecified 08/22/2020  . Moderna Sars-Covid-2 Vaccination 12/07/2019, 01/05/2020  . PFIZER SARS-COV-2 Pediatric Vaccination 5-23yr 09/24/2020  . Pneumococcal Conjugate-13  06/28/2013  . Pneumococcal Polysaccharide-23 11/25/2004  . Td 11/25/2004  . Tdap 06/28/2013  . Zoster 07/15/2011    TDAP status: Up to date  Flu Vaccine status: Up to date  Pneumococcal vaccine status: Up to date  Covid-19 vaccine status: Completed vaccines  Qualifies for Shingles Vaccine? Yes   Zostavax completed Yes   Shingrix Completed?: No.    Education has been provided regarding the importance of this vaccine. Patient has been advised to call insurance company to determine out of pocket expense if they have not yet received this vaccine. Advised may also receive vaccine at local pharmacy or Health Dept. Verbalized acceptance and understanding.  Screening Tests Health Maintenance  Topic Date Due  . DEXA SCAN  Never done  . COVID-19 Vaccine (3 - Moderna risk 4-dose series) 10/22/2020  . TETANUS/TDAP  06/29/2023  . INFLUENZA VACCINE  Completed  . PNA vac Low Risk Adult  Completed    Health Maintenance  Health Maintenance Due  Topic Date Due  . DEXA SCAN  Never done  . COVID-19 Vaccine (3 - Moderna risk 4-dose series) 10/22/2020    Colorectal cancer screening: No longer required.   Mammogram status: No longer required due to age.  Bone Density Status: No longer required   Lung Cancer Screening: (Low Dose CT Chest recommended if Age 82-80years, 30 pack-year currently smoking OR have quit w/in 15years.) does not qualify.   Lung Cancer Screening Referral: N/A   Additional Screening:  Hepatitis C Screening: does not qualify;   Vision Screening: Recommended annual ophthalmology exams for early detection of glaucoma and other disorders of the eye. Is the patient up to date with their annual eye exam?  Yes Who is the  provider or what is the name of the office in which the patient attends annual eye exams? Dr. Edwina Barth  If pt is not established with a provider, would they like to be referred to a provider to establish care? No .   Dental Screening: Recommended  annual dental exams for proper oral hygiene  Community Resource Referral / Chronic Care Management: CRR required this visit?  No   CCM required this visit?  No      Plan:     I have personally reviewed and noted the following in the patient's chart:   . Medical and social history . Use of alcohol, tobacco or illicit drugs  . Current medications and supplements . Functional ability and status . Nutritional status . Physical activity . Advanced directives . List of other physicians . Hospitalizations, surgeries, and ER visits in previous 12 months . Vitals . Screenings to include cognitive, depression, and falls . Referrals and appointments  In addition, I have reviewed and discussed with patient certain preventive protocols, quality metrics, and best practice recommendations. A written personalized care plan for preventive services as well as general preventive health recommendations were provided to patient.     Ofilia Neas, LPN   [id number]   Nurse Notes: None

## 2021-01-15 ENCOUNTER — Other Ambulatory Visit: Payer: Self-pay

## 2021-01-15 ENCOUNTER — Ambulatory Visit (INDEPENDENT_AMBULATORY_CARE_PROVIDER_SITE_OTHER): Payer: Medicare Other

## 2021-01-15 DIAGNOSIS — Z0000 Encounter for general adult medical examination without abnormal findings: Secondary | ICD-10-CM

## 2021-01-15 NOTE — Patient Instructions (Signed)
Destiny [name] , Thank you for taking time to come for your Medicare Wellness Visit. I appreciate your ongoing commitment to your health goals. Please review the following plan we discussed and let me know if I can assist you in the future.   Screening recommendations/referrals: Colonoscopy: Scheduled 01/25/2021 Mammogram: No longer required  Bone Density: No longer required  Recommended yearly ophthalmology/optometry visit for glaucoma screening and checkup Recommended yearly dental visit for hygiene and checkup  Vaccinations: Influenza vaccine: Up to date, next due fall 2022  Pneumococcal vaccine: Completed series  Tdap vaccine: Up to date, next due 06/29/2023 Shingles vaccine: Currently due for Shingrix, you do not have to get this vaccine since you had the first Shingles vaccine called Zostavax. If  You would like to get it however we would recommend that you receive at your local pharmacy.     Advanced directives: please bring copies of your advanced medical directives into our office so that we may scan it into your chart.   Conditions/risks identified: none   Next appointment: 03/27/2021 @ 11:00 am with Dr. Elease Hashimoto   Preventive Care 82 Years and Older, Female Preventive care refers to lifestyle choices and visits with your health care provider that can promote health and wellness. What does preventive care include?  A yearly physical exam. This is also called an annual well check.  Dental exams once or twice a year.  Routine eye exams. Ask your health care provider how often you should have your eyes checked.  Personal lifestyle choices, including:  Daily care of your teeth and gums.  Regular physical activity.  Eating a healthy diet.  Avoiding tobacco and drug use.  Limiting alcohol use.  Practicing safe sex.  Taking low-dose aspirin every day.  Taking vitamin and mineral supplements as recommended by your health care provider. What happens during an annual well  check? The services and screenings done by your health care provider during your annual well check will depend on your age, overall health, lifestyle risk factors, and family history of disease. Counseling  Your health care provider may ask you questions about your:  Alcohol use.  Tobacco use.  Drug use.  Emotional well-being.  Home and relationship well-being.  Sexual activity.  Eating habits.  History of falls.  Memory and ability to understand (cognition).  Work and work Statistician.  Reproductive health. Screening  You may have the following tests or measurements:  Height, weight, and BMI.  Blood pressure.  Lipid and cholesterol levels. These may be checked every 5 years, or more frequently if you are over 36 years old.  Skin check.  Lung cancer screening. You may have this screening every year starting at age 6 if you have a 30-pack-year history of smoking and currently smoke or have quit within the past 15 years.  Fecal occult blood test (FOBT) of the stool. You may have this test every year starting at age 68.  Flexible sigmoidoscopy or colonoscopy. You may have a sigmoidoscopy every 5 years or a colonoscopy every 10 years starting at age 30.  Hepatitis C blood test.  Hepatitis B blood test.  Sexually transmitted disease (STD) testing.  Diabetes screening. This is done by checking your blood sugar (glucose) after you have not eaten for a while (fasting). You may have this done every 1-3 years.  Bone density scan. This is done to screen for osteoporosis. You may have this done starting at age 41.  Mammogram. This may be done every 1-2 years. Talk  to your health care provider about how often you should have regular mammograms. Talk with your health care provider about your test results, treatment options, and if necessary, the need for more tests. Vaccines  Your health care provider may recommend certain vaccines, such as:  Influenza vaccine. This is  recommended every year.  Tetanus, diphtheria, and acellular pertussis (Tdap, Td) vaccine. You may need a Td booster every 10 years.  Zoster vaccine. You may need this after age 64.  Pneumococcal 13-valent conjugate (PCV13) vaccine. One dose is recommended after age 43.  Pneumococcal polysaccharide (PPSV23) vaccine. One dose is recommended after age 25. Talk to your health care provider about which screenings and vaccines you need and how often you need them. This information is not intended to replace advice given to you by your health care provider. Make sure you discuss any questions you have with your health care provider. Document Released: 12/07/2015 Document Revised: 07/30/2016 Document Reviewed: 09/11/2015 Elsevier Interactive Patient Education  2017 [place] Prevention in the Home Falls can cause injuries. They can happen to people of all ages. There are many things you can do to make your home safe and to help prevent falls. What can I do on the outside of my home?  Regularly fix the edges of walkways and driveways and fix any cracks.  Remove anything that might make you trip as you walk through a door, such as a raised step or threshold.  Trim any bushes or trees on the path to your home.  Use bright outdoor lighting.  Clear any walking paths of anything that might make someone trip, such as rocks or tools.  Regularly check to see if handrails are loose or broken. Make sure that both sides of any steps have handrails.  Any raised decks and porches should have guardrails on the edges.  Have any leaves, snow, or ice cleared regularly.  Use sand or salt on walking paths during winter.  Clean up any spills in your garage right away. This includes oil or grease spills. What can I do in the bathroom?  Use night lights.  Install grab bars by the toilet and in the tub and shower. Do not use towel bars as grab bars.  Use non-skid mats or decals in the tub or  shower.  If you need to sit down in the shower, use a plastic, non-slip stool.  Keep the floor dry. Clean up any water that spills on the floor as soon as it happens.  Remove soap buildup in the tub or shower regularly.  Attach bath mats securely with double-sided non-slip rug tape.  Do not have throw rugs and other things on the floor that can make you trip. What can I do in the bedroom?  Use night lights.  Make sure that you have a light by your bed that is easy to reach.  Do not use any sheets or blankets that are too big for your bed. They should not hang down onto the floor.  Have a firm chair that has side arms. You can use this for support while you get dressed.  Do not have throw rugs and other things on the floor that can make you trip. What can I do in the kitchen?  Clean up any spills right away.  Avoid walking on wet floors.  Keep items that you use a lot in easy-to-reach places.  If you need to reach something above you, use a strong step stool that  has a grab bar.  Keep electrical cords out of the way.  Do not use floor polish or wax that makes floors slippery. If you must use wax, use non-skid floor wax.  Do not have throw rugs and other things on the floor that can make you trip. What can I do with my stairs?  Do not leave any items on the stairs.  Make sure that there are handrails on both sides of the stairs and use them. Fix handrails that are broken or loose. Make sure that handrails are as long as the stairways.  Check any carpeting to make sure that it is firmly attached to the stairs. Fix any carpet that is loose or worn.  Avoid having throw rugs at the top or bottom of the stairs. If you do have throw rugs, attach them to the floor with carpet tape.  Make sure that you have a light switch at the top of the stairs and the bottom of the stairs. If you do not have them, ask someone to add them for you. What else can I do to help prevent  falls?  Wear shoes that:  Do not have high heels.  Have rubber bottoms.  Are comfortable and fit you well.  Are closed at the toe. Do not wear sandals.  If you use a stepladder:  Make sure that it is fully opened. Do not climb a closed stepladder.  Make sure that both sides of the stepladder are locked into place.  Ask someone to hold it for you, if possible.  Clearly mark and make sure that you can see:  Any grab bars or handrails.  First and last steps.  Where the edge of each step is.  Use tools that help you move around (mobility aids) if they are needed. These include:  Canes.  Walkers.  Scooters.  Crutches.  Turn on the lights when you go into a dark area. Replace any light bulbs as soon as they burn out.  Set up your furniture so you have a clear path. Avoid moving your furniture around.  If any of your floors are uneven, fix them.  If there are any pets around you, be aware of where they are.  Review your medicines with your doctor. Some medicines can make you feel dizzy. This can increase your chance of falling. Ask your doctor what other things that you can do to help prevent falls. This information is not intended to replace advice given to you by your health care provider. Make sure you discuss any questions you have with your health care provider. Document Released: 09/06/2009 Document Revised: 04/17/2016 Document Reviewed: 12/15/2014 Elsevier Interactive Patient Education  2017 [employer].

## 2021-01-18 ENCOUNTER — Telehealth: Payer: Self-pay | Admitting: Family Medicine

## 2021-01-18 NOTE — Progress Notes (Signed)
  Chronic Care Management   Note  01/18/2021 Name: Tache Bobst MRN: [id number] DOB: 16-Nov-1941  Charm Barges Stalder is a 80 y.o. year old female who is a primary care patient of Burchette, Alinda Sierras, MD. I reached out to [place] by phone today in response to a referral sent by Ms. Charm Barges Pokorny's PCP, Burchette, Alinda Sierras, MD.   Ms. Colter was given information about Chronic Care Management services today including:  1. CCM service includes personalized support from designated clinical staff supervised by her physician, including individualized plan of care and coordination with other care providers 2. 24/7 contact phone numbers for assistance for urgent and routine care needs. 3. Service will only be billed when office clinical staff spend 20 minutes or more in a month to coordinate care. 4. Only one practitioner may furnish and bill the service in a calendar month. 5. The patient may stop CCM services at any time (effective at the end of the month) by phone call to the office staff.   Patient agreed to services and verbal consent obtained.   Follow up plan:   Carley Perdue [employer] Scheduler

## 2021-01-25 ENCOUNTER — Other Ambulatory Visit: Payer: Self-pay

## 2021-01-25 ENCOUNTER — Ambulatory Visit (AMBULATORY_SURGERY_CENTER): Payer: Medicare Other | Admitting: Internal Medicine

## 2021-01-25 ENCOUNTER — Encounter: Payer: Self-pay | Admitting: Internal Medicine

## 2021-01-25 VITALS — BP 69/46 | HR 63 | Temp 97.3°F | Resp 17 | Ht 61.0 in | Wt 161.0 lb

## 2021-01-25 DIAGNOSIS — K644 Residual hemorrhoidal skin tags: Secondary | ICD-10-CM | POA: Diagnosis not present

## 2021-01-25 DIAGNOSIS — R195 Other fecal abnormalities: Secondary | ICD-10-CM | POA: Diagnosis not present

## 2021-01-25 DIAGNOSIS — K5730 Diverticulosis of large intestine without perforation or abscess without bleeding: Secondary | ICD-10-CM | POA: Diagnosis not present

## 2021-01-25 DIAGNOSIS — K648 Other hemorrhoids: Secondary | ICD-10-CM | POA: Diagnosis not present

## 2021-01-25 MED ORDER — SODIUM CHLORIDE 0.9 % IV SOLN: 500.0000 mL | Freq: Once | INTRAVENOUS | Status: DC

## 2021-01-25 NOTE — Op Note (Signed)
[facility] Patient Name: Destiny [name] Procedure Date: 01/25/2021 9:08 AM MRN: [id number] Endoscopist: Jerene Bears , MD Age: 80 Referring MD:  Date of Birth: 06/04/1941 Gender: Female Account #: [id number] Procedure:                Colonoscopy Indications:              Positive Cologuard test Medicines:                Monitored Anesthesia Care Procedure:                Pre-Anesthesia Assessment:                           - Prior to the procedure, a History and Physical                            was performed, and patient medications and                            allergies were reviewed. The patient's tolerance of                            previous anesthesia was also reviewed. The risks                            and benefits of the procedure and the sedation                            options and risks were discussed with the patient.                            All questions were answered, and informed consent                            was obtained. Prior Anticoagulants: The patient has                            taken no previous anticoagulant or antiplatelet                            agents. ASA Grade Assessment: II - A patient with                            mild systemic disease. After reviewing the risks                            and benefits, the patient was deemed in                            satisfactory condition to undergo the procedure.                           After obtaining informed consent, the colonoscope  was passed under direct vision. Throughout the                            procedure, the patient's blood pressure, pulse, and                            oxygen saturations were monitored continuously. The                            Olympus [id number]-[id number] (#[id number]) Colonoscope was                            introduced through the anus and advanced to the                            cecum. The colonoscopy was performed without                             difficulty. The patient tolerated the procedure                            well. The quality of the bowel preparation was                            good. The ileocecal valve, appendiceal orifice, and                            rectum were photographed. Scope In: 9:20:56 AM Scope Out: 9:42:05 AM Scope Withdrawal Time: 0 hours 13 minutes 49 seconds  Total Procedure Duration: 0 hours 21 minutes 9 seconds  Findings:                 The digital rectal exam was normal.                           A few small-mouthed diverticula were found in the                            sigmoid colon.                           Internal hemorrhoids and a perianal skin tag were                            found during retroflexion. The hemorrhoids were                            small.                           The exam was otherwise without abnormality. Complications:            No immediate complications. Estimated Blood Loss:     Estimated blood loss: none. Impression:               - Diverticulosis in the sigmoid colon.                           -  Small internal hemorrhoids.                           - The examination was otherwise normal.                           - No specimens collected. Recommendation:           - Patient has a contact number available for                            emergencies. The signs and symptoms of potential                            delayed complications were discussed with the                            patient. Return to normal activities tomorrow.                            Written discharge instructions were provided to the                            patient.                           - Resume previous diet.                           - Continue present medications.                           - No repeat colonoscopy due to age and the absence                            of colonic polyps. Jerene Bears, MD 01/25/2021 9:44:35 AM This report has been  signed electronically.

## 2021-01-25 NOTE — Patient Instructions (Signed)
Handouts given:  Diverticulosis Resume previous diet Continue current medications  YOU HAD AN ENDOSCOPIC PROCEDURE TODAY AT [facility]:   Refer to the procedure report that was given to you for any specific questions about what was found during the examination.  If the procedure report does not answer your questions, please call your gastroenterologist to clarify.  If you requested that your care partner not be given the details of your procedure findings, then the procedure report has been included in a sealed envelope for you to review at your convenience later.  YOU SHOULD EXPECT: Some feelings of bloating in the abdomen. Passage of more gas than usual.  Walking can help get rid of the air that was put into your GI tract during the procedure and reduce the bloating. If you had a lower endoscopy (such as a colonoscopy or flexible sigmoidoscopy) you may notice spotting of blood in your stool or on the toilet paper. If you underwent a bowel prep for your procedure, you may not have a normal bowel movement for a few days.  Please Note:  You might notice some irritation and congestion in your nose or some drainage.  This is from the oxygen used during your procedure.  There is no need for concern and it should clear up in a day or so.  SYMPTOMS TO REPORT IMMEDIATELY:   Following lower endoscopy (colonoscopy or flexible sigmoidoscopy):  Excessive amounts of blood in the stool  Significant tenderness or worsening of abdominal pains  Swelling of the abdomen that is new, acute  Fever of 100F or higher  For urgent or emergent issues, a gastroenterologist can be reached at any hour by calling [phone]. Do not use MyChart messaging for urgent concerns.   DIET:  We do recommend a small meal at first, but then you may proceed to your regular diet.  Drink plenty of fluids but you should avoid alcoholic beverages for 24 hours.  ACTIVITY:  You should plan to take it easy for the  rest of today and you should NOT DRIVE or use heavy machinery until tomorrow (because of the sedation medicines used during the test).    FOLLOW UP: Our staff will call the number listed on your records 48-72 hours following your procedure to check on you and address any questions or concerns that you may have regarding the information given to you following your procedure. If we do not reach you, we will leave a message.  We will attempt to reach you two times.  During this call, we will ask if you have developed any symptoms of COVID 19. If you develop any symptoms (ie: fever, flu-like symptoms, shortness of breath, cough etc.) before then, please call [phone].  If you test positive for Covid 19 in the 2 weeks post procedure, please call and report this information to [place].    If any biopsies were taken you will be contacted by phone or by letter within the next 1-3 weeks.  Please call us at [phone] if you have not heard about the biopsies in 3 weeks.   SIGNATURES/CONFIDENTIALITY: You and/or your care partner have signed paperwork which will be entered into your electronic medical record.  These signatures attest to the fact that that the information above on your After Visit Summary has been reviewed and is understood.  Full responsibility of the confidentiality of this discharge information lies with you and/or your care-partner.

## 2021-01-25 NOTE — Progress Notes (Signed)
To PACU, VSS. Report to [email or web address] 

## 2021-01-29 ENCOUNTER — Telehealth: Payer: Self-pay

## 2021-01-29 NOTE — Telephone Encounter (Signed)
  Follow up Call-  Call back number 01/25/2021  Post procedure Call Back phone  # [phone]  Permission to leave phone message Yes  Some recent data might be hidden     Patient questions:  Do you have a fever, pain , or abdominal swelling? No. Pain Score  0 *  Have you tolerated food without any problems? Yes.    Have you been able to return to your normal activities? Yes.    Do you have any questions about your discharge instructions: Diet   No. Medications  No. Follow up visit  No.  Do you have questions or concerns about your Care? No.  Actions: * If pain score is 4 or above: No action needed, pain <4. 1. Have you developed a fever since your procedure? no  2.   Have you had an respiratory symptoms (SOB or cough) since your procedure? no  3.   Have you tested positive for COVID 19 since your procedure no  4.   Have you had any family members/close contacts diagnosed with the COVID 19 since your procedure?  no   If yes to any of these questions please route to Joylene John, RN and Joella Prince, RN

## 2021-01-31 ENCOUNTER — Other Ambulatory Visit: Payer: Self-pay | Admitting: Family Medicine

## 2021-02-01 NOTE — Telephone Encounter (Signed)
Pt is requesting a refill , please advise. 

## 2021-02-11 IMAGING — CT CT OF THE LOWER RIGHT EXTREMITY WITH CONTRAST
1 series · 12 of 14 positions shown, 15 images · IV contrast (agent unspecified)
Comparison: None.

CLINICAL DATA: Right thigh cellulitis.

EXAM:
CT OF THE LOWER RIGHT EXTREMITY WITH CONTRAST
TECHNIQUE: Multidetector CT imaging of the lower right extremity was performed
according to the standard protocol following intravenous contrast
administration.

[Series 5: soft tissue lower extremity · axial · 0.54mm/px · z∈[-552,-116]mm · 12 of 258 slices shown, 15 images]
[im 20/258  soft-tissue]
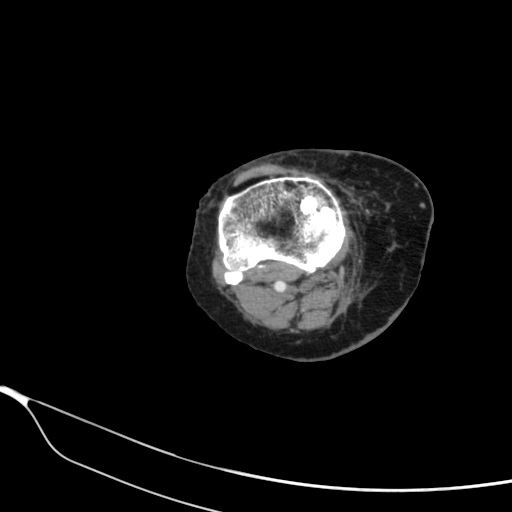
[im 20/258  bone]
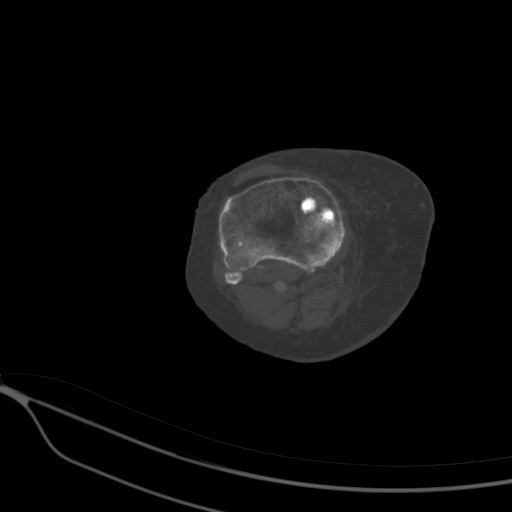
[im 40/258  bone]
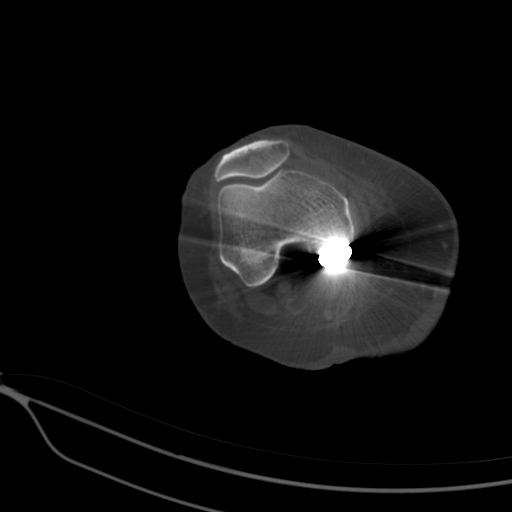
[im 60/258  bone]
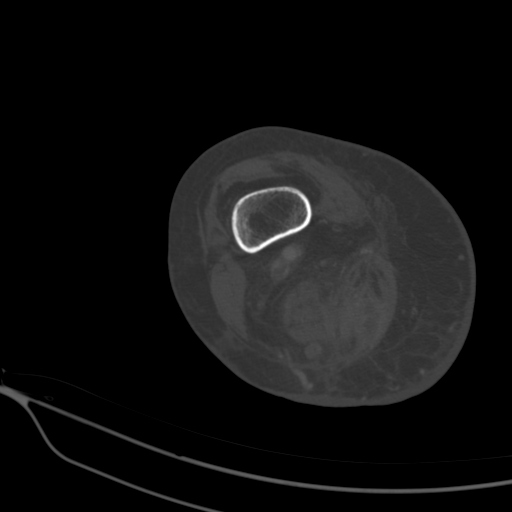
[im 80/258  bone]
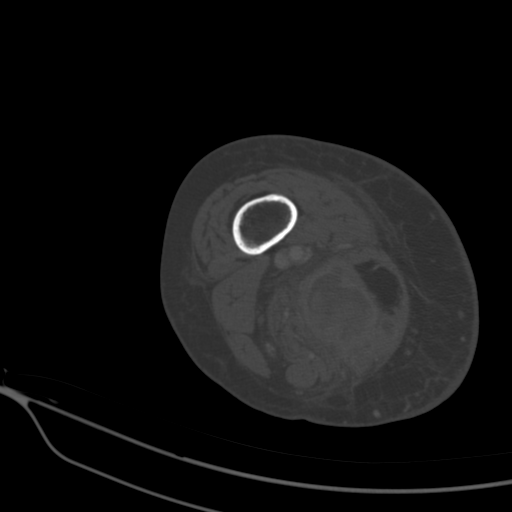
[im 99/258  soft-tissue]
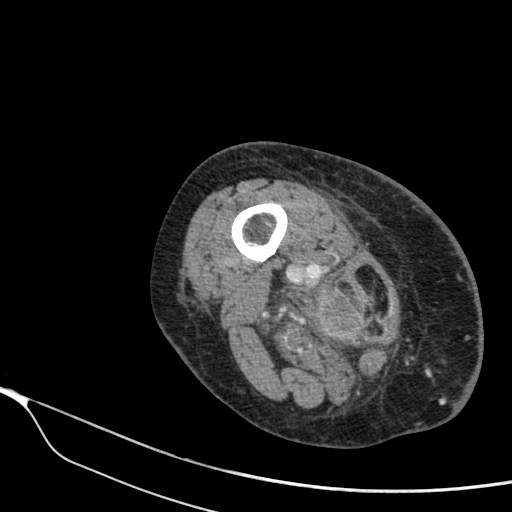
[im 99/258  bone]
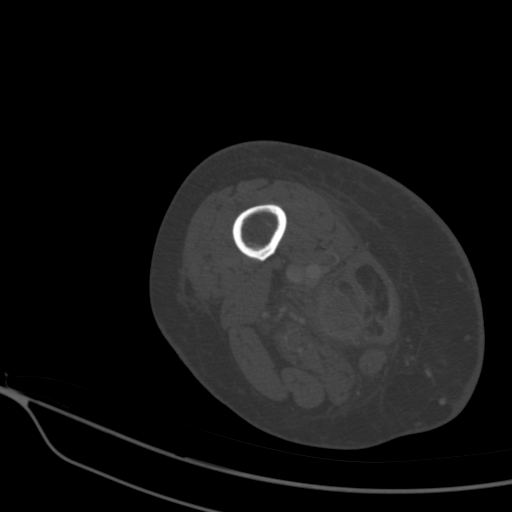
[im 119/258  bone]
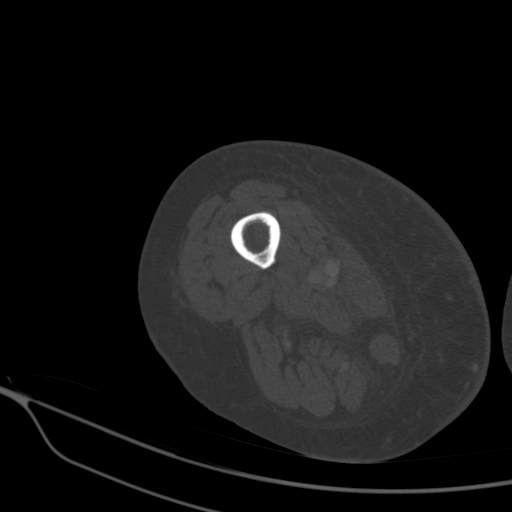
[im 139/258  bone]
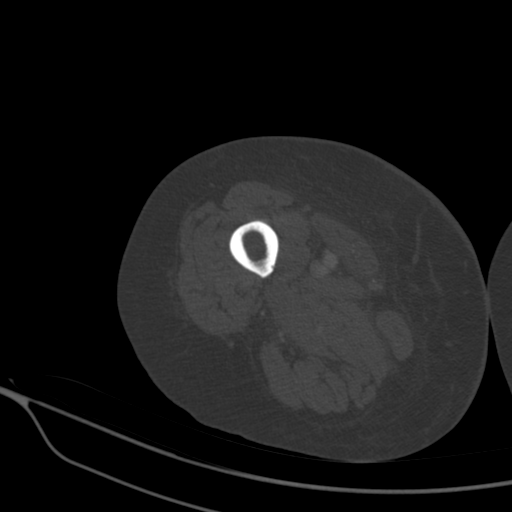
[im 159/258  bone]
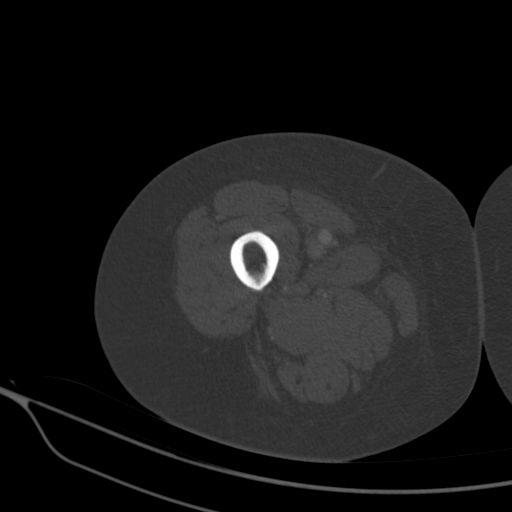
[im 178/258  soft-tissue]
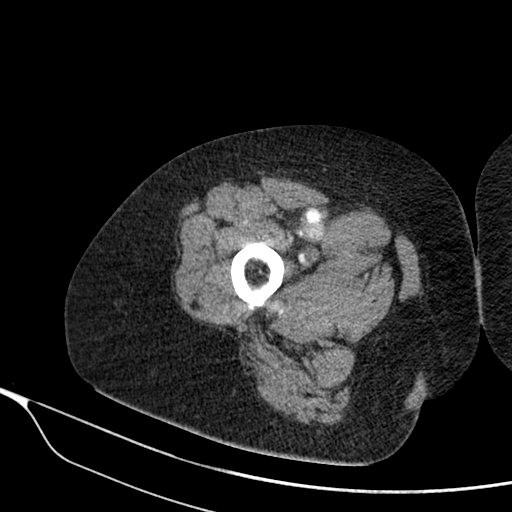
[im 178/258  bone]
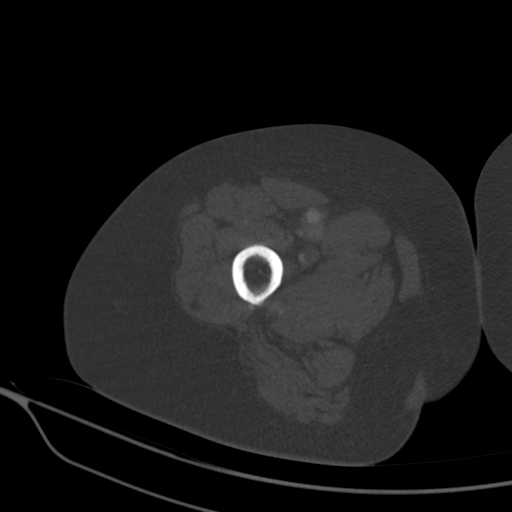
[im 198/258  bone]
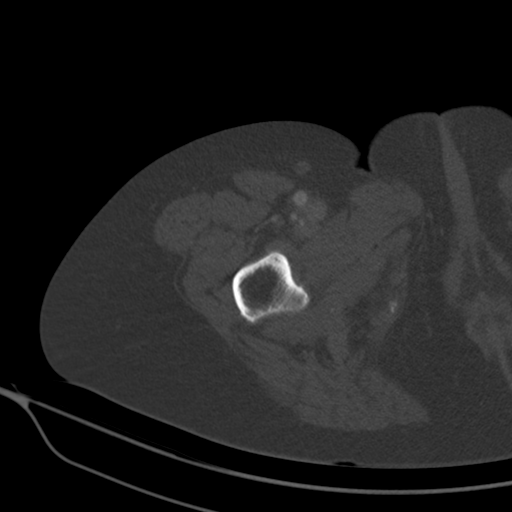
[im 218/258  bone]
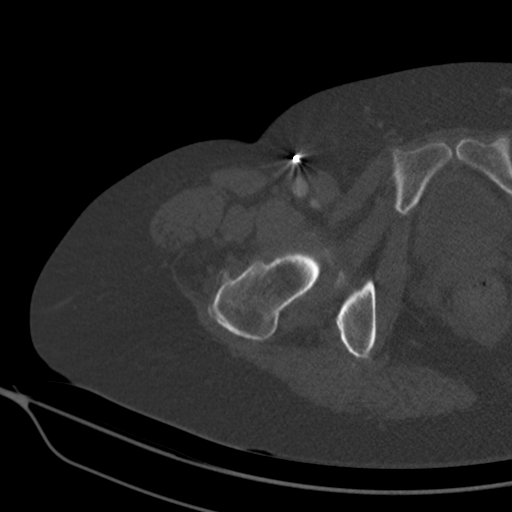
[im 238/258  bone]
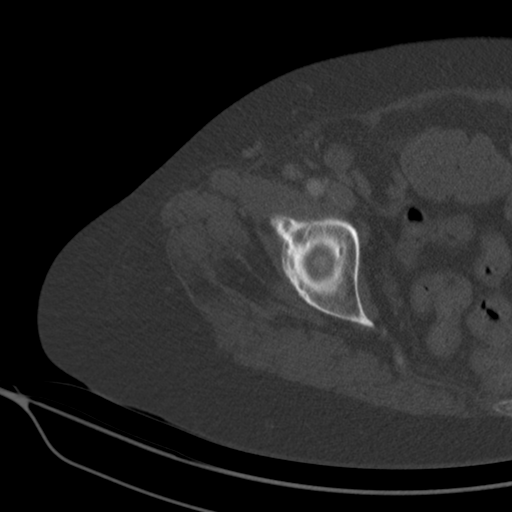

[12 of 14 positions shown; findings below may reference images not displayed]

CONTRAST:  100mL CUARR0-3ZZ IOPAMIDOL (CUARR0-3ZZ) INJECTION 61%

Creatinine was obtained on site at [HOSPITAL] at [HOSPITAL].

Results: Creatinine 0.8 mg/dL.
FINDINGS: Bones/Joint/Cartilage

No acute fracture or dislocation. Prior right knee medial
compartment arthroplasty. Trace knee joint effusion. Mild left hip
joint space narrowing with small marginal osteophytes.

Ligaments

Suboptimally assessed by CT.

Muscles and Tendons

Large 5.9 x 5.4 x 11.5 cm complex mass in the distal right sartorius
muscle containing fat and a large cystic component with thickened,
enhancing septa, as well as prominent areas of ill-defined nodular
solid enhancement. There is loss of the normal fat planes with the
adjacent distal gracilis and semimembranosus muscles.

Soft tissues

Prominent skin thickening and mild-to-moderate soft tissue edema
along the mid to distal medial and posterior thigh. No fluid
collection. The vessels are patent and uninvolved by the distal
thigh mass. No lymphadenopathy.
IMPRESSION: 1. 5.9 x 5.4 x 11.5 cm intramuscular mass in the distal right
sartorius muscle, concerning for liposarcoma. Potential involvement
of the adjacent distal gracilis and semimembranosus muscles.
2. Prominent skin thickening and mild to moderate soft tissue edema
along the mid to distal medial and posterior thigh, consistent with
reported clinical history of cellulitis. No abscess.
3. No acute osseous abnormality.

These results will be called to the ordering clinician or
representative by the Radiologist Assistant, and communication
documented in the PACS or zVision Dashboard.

## 2021-02-19 ENCOUNTER — Encounter: Payer: Self-pay | Admitting: Family Medicine

## 2021-02-19 DIAGNOSIS — H9190 Unspecified hearing loss, unspecified ear: Secondary | ICD-10-CM

## 2021-02-26 ENCOUNTER — Other Ambulatory Visit: Payer: Self-pay | Admitting: Family Medicine

## 2021-03-22 ENCOUNTER — Other Ambulatory Visit: Payer: Self-pay | Admitting: Family Medicine

## 2021-03-25 ENCOUNTER — Telehealth: Payer: Self-pay | Admitting: Pharmacist

## 2021-03-26 NOTE — Chronic Care Management (AMB) (Signed)
Chronic Care Management Pharmacy Assistant   Name: Destiny [name]  MRN: [id number] DOB: [birth year]  Reason for Encounter: Chart Prep for CPP Visit for 03/27/2021   Conditions to be addressed/monitored: HTN and Hypothroidism  Primary concerns for visit include: Hypertension  Recent office visits:  . 02.22.2022 Launa Grill, LPN Medicare Wellness Exam . 02.02.2022 Eulas Post, MD Eczematous dermatitis of upper eyelids of both eyes o Medication prescribed o triamcinolone (KENALOG) 0.025 % cream Sig: Apply 1 application topically 2 (two) times daily . 11.12.2021Burchette, Alinda Sierras, MD (PCP) essential hypertension o medication change o levothyroxine sodium 125 MCG tablets change to 127 MCG tablets daily before breakfast  Recent consult visits:  . 03.04.2022 Jerene Bears, MD Gastroenterology- Procedure visit- Coloscopy  . 02.10.2022 Pyrtle, Lajuan Lines, MD Gastroenterology . 01.25.2021 Marygrace Drought, MD Orthopedics-Liposarcoma . 12.21.2021 Marygrace Drought, MD Orthopedics-Liposarcoma . 12.02.2021 Tev[place], [place] Once a week  Hospital visits:  None in previous 6 months   Patient Questions:   1. Have you seen any other providers since your last visit?  a. Pain management b. Gastroenterology c. oncology 2. Any changes in your medications or health? No 3. Any side effects from any medications? No 4. Any concerns about your health right now? No 5. Has your provider asked that you check blood pressure, blood sugar, or follow special diet at home? No 6. Do you get any type of exercise on a regular basis?  a. she walks three to four times a week b. physical therapy 7. Can you think of a goal you would like to reach for your health?  a. gain more strength in her right leg 8. Do you have any problems getting your medications? No 9. Is there anything that you would like to discuss during the appointment? No  The patient was asked  to please bring medications, blood pressure/ blood sugar log, and supplements to her appointment.  Medication Dispensed  Quantity  Levothyroxine (SYNTHROID) 137 MCG tablet one tablet daily before breakfast 04.29.2022 90  Ipratropium (ATROVENT) 0.06 % nasal spray two sprays in each nostril three times a day 04.05.2022 5 mL  zolpidem (AMBIEN) 5 MG tablet one tablet at bedtime 03.11.2022 90  triamcinolone (KENALOG) 0.025 % cream apply twice a day 02.02.2022 15g  Amlodipine (NORVASC) 5 MG tablet daily 02.10.2022 90  metoprolol succinate (TOPROL-XL) 50 MG 24 hr tablet one tablet twice a day 02.10.2022 180  Olmesartan (BENICAR) 40 MG tablet daily 02.10.2022 90  acetaminophen (TYLENOL) 500 MG tablet    b complex vitamins capsule    Calcium Carb-Cholecalciferol 600-200 MG-UNIT TABS    cholecalciferol (VITAMIN D) 1000 UNITS tablet    docusate sodium (COLACE) 100 MG capsule    ferrous sulfate 325 (65 FE) MG tablet    gabapentin (NEURONTIN) 300 MG capsule    loratadine-pseudoephedrine (CLARITIN-D 12-HOUR) 5-120 MG per tablet    Soybean-Soy Isoflavones (SOY BALANCE PO)     Medications: Outpatient Encounter Medications as of 03/25/2021  Medication Sig Note  . acetaminophen (TYLENOL) 500 MG tablet Take 500 mg by mouth every 4 (four) hours as needed.   Marland Kitchen amLODipine (NORVASC) 5 MG tablet TAKE 1 TABLET BY MOUTH DAILY.   Marland Kitchen b complex vitamins capsule Take 1 capsule by mouth daily.   . Calcium Carb-Cholecalciferol 600-200 MG-UNIT TABS Take 1 tablet by mouth daily.    . cephALEXin (KEFLEX) 250 MG capsule Take 250 mg by mouth daily.   . cholecalciferol (VITAMIN D)  1000 UNITS tablet Take 2,000 Units by mouth daily.   Marland Kitchen docusate sodium (COLACE) 100 MG capsule Take 100 mg by mouth daily.   . ferrous sulfate 325 (65 FE) MG tablet Take 325 mg by mouth every other day.  (Patient not taking: Reported on 01/25/2021)   . gabapentin (NEURONTIN) 300 MG capsule Take 600-900 mg by mouth See admin instructions. Take 300 mg  every morning and900 mg at bedtime   . HYDROcodone-acetaminophen (NORCO/VICODIN) 5-325 MG tablet Take 1 tablet by mouth every 4 (four) hours as needed for moderate pain or severe pain.  07/16/2016: Received from: [place]: Take 1 tablet by mouth once daily as needed.  Marland Kitchen ipratropium (ATROVENT) 0.06 % nasal spray PLACE 2 SPRAYS IN EACH NOSTRIL 3 TIMES A DAY   . levothyroxine (SYNTHROID) 137 MCG tablet TAKE 1 TABLET (137 MCG TOTAL) BY MOUTH DAILY BEFORE BREAKFAST.   Marland Kitchen loratadine-pseudoephedrine (CLARITIN-D 12-HOUR) 5-120 MG per tablet Take 1 tablet by mouth daily.   . methocarbamol (ROBAXIN) 750 MG tablet Take 750 mg by mouth 2 (two) times daily as needed for muscle spasms.  (Patient not taking: No sig reported)   . metoprolol succinate (TOPROL-XL) 50 MG 24 hr tablet TAKE 1 TABLET BY MOUTH 2 TIMES DAILY.   Marland Kitchen olmesartan (BENICAR) 40 MG tablet TAKE 1 TABLET BY MOUTH DAILY.   Marnee Guarneri Isoflavones (SOY BALANCE PO) Take 1 tablet by mouth daily. Soy bean extract   . triamcinolone (KENALOG) 0.025 % cream Apply 1 application topically 2 (two) times daily. (Patient not taking: No sig reported)   . zolpidem (AMBIEN) 5 MG tablet TAKE 1 TABLET BY MOUTH ONCE DAILY AT BEDTIME    No facility-administered encounter medications on file as of 03/25/2021.   Star Rating Drugs:  Dispensed Quantity Pharmacy  Olmesartan Medoxomil 40 mg   02.10.2022 [address] [place]) [place], [place] [phone]

## 2021-03-27 ENCOUNTER — Encounter: Payer: Self-pay | Admitting: Family Medicine

## 2021-03-27 ENCOUNTER — Other Ambulatory Visit: Payer: Self-pay | Admitting: Family Medicine

## 2021-03-27 ENCOUNTER — Telehealth: Payer: Medicare Other

## 2021-03-27 ENCOUNTER — Ambulatory Visit (INDEPENDENT_AMBULATORY_CARE_PROVIDER_SITE_OTHER): Payer: Medicare Other

## 2021-03-27 ENCOUNTER — Ambulatory Visit (INDEPENDENT_AMBULATORY_CARE_PROVIDER_SITE_OTHER): Payer: Medicare Other | Admitting: Family Medicine

## 2021-03-27 ENCOUNTER — Other Ambulatory Visit: Payer: Self-pay

## 2021-03-27 VITALS — BP 120/60 | HR 67 | Temp 98.1°F | Resp 18 | Ht 61.0 in | Wt 156.8 lb

## 2021-03-27 DIAGNOSIS — E039 Hypothyroidism, unspecified: Secondary | ICD-10-CM

## 2021-03-27 DIAGNOSIS — I10 Essential (primary) hypertension: Secondary | ICD-10-CM | POA: Diagnosis not present

## 2021-03-27 DIAGNOSIS — C499 Malignant neoplasm of connective and soft tissue, unspecified: Secondary | ICD-10-CM | POA: Diagnosis not present

## 2021-03-27 DIAGNOSIS — F5104 Psychophysiologic insomnia: Secondary | ICD-10-CM

## 2021-03-27 NOTE — Patient Instructions (Signed)
Follow up for any increased shortness of breath or pleuritic pain (pain with deep breathing).

## 2021-03-27 NOTE — Progress Notes (Deleted)
Chronic Care Management Pharmacy Note  03/27/2021 Name:  Destiny [name] MRN:  [id number] DOB:  26-Apr-1941  Subjective: Destiny [name] is an 80 y.o. year old female who is a primary patient of Burchette, Alinda Sierras, MD.  The CCM team was consulted for assistance with disease management and care coordination needs.    Engaged with patient by telephone for initial visit in response to provider referral for pharmacy case management and/or care coordination services.   Consent to Services:  The patient was given the following information about Chronic Care Management services today, agreed to services, and gave verbal consent: 1. CCM service includes personalized support from designated clinical staff supervised by the primary care provider, including individualized plan of care and coordination with other care providers 2. 24/7 contact phone numbers for assistance for urgent and routine care needs. 3. Service will only be billed when office clinical staff spend 20 minutes or more in a month to coordinate care. 4. Only one practitioner may furnish and bill the service in a calendar month. 5.The patient may stop CCM services at any time (effective at the end of the month) by phone call to the office staff. 6. The patient will be responsible for cost sharing (co-pay) of up to 20% of the service fee (after annual deductible is met). Patient agreed to services and consent obtained.  Patient Care Team: Eulas Post, MD as PCP - General (Family Medicine) Viona Gilmore, [facility] as Pharmacist (Pharmacist)  Recent office visits:  05.04.2022 Destiny Littler, MD: Patient presented for chronic conditions follow up. No med changes.   02.22.2022 Launa Grill, LPN: Patient presented for Medicare Wellness Exam.   02.02.2022 Eulas Post, MD: Patient presented for eczematous dermatitis of upper eyelids of both eyes. Prescribed triamcinolone (KENALOG) 0.025 % cream.   11.12.2021  Burchette, Alinda Sierras, MD: Patient presented for HTN and hypothyroidism follow up. Increased levothyroxine to 137 mcg daily.  Recent consult visits:  03.04.2022 Jerene Bears, MD Gastroenterology: Patient presented for colonoscopy procedure visit.   02.10.2022 Jerene Bears, MD Gastroenterology: Patient presented for positive cologuard test. Plan for repeat colonoscopy.   01.27.2022 Stark Bray, PT: Patient presented for PT session for lymphedema.   01.25.2021 Marygrace Drought, MD Radiology: Patient presented for lymphedema follow up.   12.21.2021 Marygrace Drought, MD Radiology: Patient presented for lymphedema follow up.   12.02.2021 Stark Bray, PT: Patient presented for PT session for lymphedema.   Hospital visits: None in previous 6 months  Objective:  Lab Results  Component Value Date   CREATININE 0.89 04/03/2020   BUN 27 (H) 04/03/2020   GFR 60.85 04/03/2020   GFRNONAA >60 04/29/2019   GFRAA >60 04/29/2019   NA 136 04/03/2020   K 5.1 04/03/2020   CALCIUM 9.1 04/03/2020   CO2 29 04/03/2020   GLUCOSE 100 (H) 04/03/2020    Lab Results  Component Value Date/Time   GFR 60.85 04/03/2020 11:19 AM    Last diabetic Eye exam: No results found for: HMDIABEYEEXA  Last diabetic Foot exam: No results found for: HMDIABFOOTEX   Lab Results  Component Value Date   CHOL 177 12/05/2009   HDL 46 12/05/2009   LDLCALC 89 12/05/2009   LDLDIRECT 128 (H) 07/26/2008   TRIG 210 (H) 12/05/2009   CHOLHDL 3.8 Ratio 12/05/2009    Hepatic Function Latest Ref Rng & Units 04/28/2019 12/19/2014 09/17/2011  Total Protein 6.5 - 8.1 g/dL 6.0(L) 5.9(L) 6.3  Albumin 3.5 -  5.0 g/dL 3.3(L) 4.5 3.8  AST 15 - 41 U/L 19 32 23  ALT 0 - 44 U/L _0 Alk Phosphatase 38 - 126 U/L 50 63 58  Total Bilirubin 0.3 - 1.2 mg/dL 0.7 0.5 0.2(L)    Lab Results  Component Value Date/Time   TSH 3.81 07/26/2020 10:19 AM   TSH 5.62 (H) 04/03/2020 11:19 AM   FREET4 1.5 07/26/2020 10:19 AM    FREET4 1.27 04/04/2009 08:34 PM    CBC Latest Ref Rng & Units 04/03/2020 07/21/2019 04/29/2019  WBC 4.0 - 10.5 K/uL 7.0 7.6 12.9(H)  Hemoglobin 12.0 - 15.0 g/dL 12.1 12.0 8.5(L)  Hematocrit 36.0 - 46.0 % 35.4(L) 36.2 25.7(L)  Platelets 150.0 - 400.0 K/uL 325.0 313 441(H)    No results found for: VD25OH  Clinical ASCVD: {YES/NO:21197} The ASCVD Risk score Mikey Bussing DC Jr., et al., 2013) failed to calculate for the following reasons:   The 2013 ASCVD risk score is only valid for ages 25 to 68    Depression screen PHQ 2/9 01/15/2021 01/09/2020 10/05/2019  Decreased Interest 0 - 0  Down, Depressed, Hopeless 0 1 0  PHQ - 2 Score 0 1 0  Altered sleeping - - 2  Tired, decreased energy - - 0  Change in appetite - - 0  Feeling bad or failure about yourself  - - 0  Trouble concentrating - - 0  Moving slowly or fidgety/restless - - 0  Suicidal thoughts - - 0  PHQ-9 Score - - 2  Difficult doing work/chores - - Not difficult at all  Some recent data might be hidden     ***Other: (CHADS2VASc if Afib, MMRC or CAT for COPD, ACT, DEXA)  Social History   Tobacco Use  Smoking Status Never Smoker  Smokeless Tobacco Never Used   BP Readings from Last 3 Encounters:  01/25/21 (!) 69/46  01/03/21 122/70  12/26/20 128/70   Pulse Readings from Last 3 Encounters:  01/25/21 63  01/03/21 67  12/26/20 66   Wt Readings from Last 3 Encounters:  01/25/21 161 lb (73 kg)  01/03/21 161 lb 12.8 oz (73.4 kg)  12/26/20 162 lb (73.5 kg)   BMI Readings from Last 3 Encounters:  01/25/21 30.42 kg/m  01/03/21 30.57 kg/m  12/26/20 30.61 kg/m    Assessment/Interventions: Review of patient past medical history, allergies, medications, health status, including review of consultants reports, laboratory and other test data, was performed as part of comprehensive evaluation and provision of chronic care management services.   SDOH:  (Social Determinants of Health) assessments and interventions performed:  {yes/no:20286}  SDOH Screenings   Alcohol Screen: Low Risk   . Last Alcohol Screening Score (AUDIT): 4  Depression (PHQ2-9): Low Risk   . PHQ-2 Score: 0  Financial Resource Strain: Low Risk   . Difficulty of Paying Living Expenses: Not hard at all  Food Insecurity: No Food Insecurity  . Worried About Charity fundraiser in the Last Year: Never true  . Ran Out of Food in the Last Year: Never true  Housing: Low Risk   . Last Housing Risk Score: 0  Physical Activity: Insufficiently Active  . Days of Exercise per Week: 3 days  . Minutes of Exercise per Session: 30 min  Social Connections: Moderately Isolated  . Frequency of Communication with Friends and Family: Twice a week  . Frequency of Social Gatherings with Friends and Family: Once a week  . Attends Religious Services: Never  . Active Member  of Clubs or Organizations: No  . Attends Archivist Meetings: Never  . Marital Status: Married  Stress: No Stress Concern Present  . Feeling of Stress : Not at all  Tobacco Use: Low Risk   . Smoking Tobacco Use: Never Smoker  . Smokeless Tobacco Use: Never Used  Transportation Needs: No Transportation Needs  . Lack of Transportation (Medical): No  . Lack of Transportation (Non-Medical): No   CCM Care Plan  Allergies  Allergen Reactions  . Nsaids Other (See Comments)     BRUISES EASILY Other Reaction: BRUISES EASILY Other Reaction: BRUISES EASILY   . Oxycodone-Acetaminophen Other (See Comments)     VERY CONSTIPATING, does not provide pain relief  . Aspirin Other (See Comments)    Bruising   . Augmentin [Amoxicillin-Pot Clavulanate] Other (See Comments)    headache  . Ciprocin-Fluocin-Procin [Fluocinolone]   . Ciprofloxacin Hcl     tendonitis  . Fentanyl Other (See Comments)    Does not work  . Fluclorolone Other (See Comments)  . Lyrica [Pregabalin] Other (See Comments)    dysphoria  . Oxycodone Other (See Comments)    Does not work  . Versed [Midazolam]  Other (See Comments)    Does not work  . Diclofenac Sodium Other (See Comments) and Rash    Skin peeling. Voltaren gel-rash and skin peeling   . Hydromorphone Hcl Itching     Itching with large doses - able to tolerate lower doses or shorter duration  . Voltaren Gel [Diclofenac Sodium] Rash and Other (See Comments)    Skin peeling.    Medications Reviewed Today    Reviewed by Laretta Alstrom, CRNA (Certified Registered Nurse Anesthetist) on 01/25/21 at [place] List Status: <None>  Medication Order Taking? Sig Documenting Provider Last Dose Status Informant  0.9 %  sodium chloride infusion [id number]   Pyrtle, Lajuan Lines, MD  Active   acetaminophen (TYLENOL) 500 MG tablet [id number] Yes Take 500 mg by mouth every 4 (four) hours as needed. [provider] 01/24/2021 Active Self  amLODipine (NORVASC) 5 MG tablet [id number] Yes TAKE 1 TABLET BY MOUTH DAILY. Eulas Post, MD 01/25/2021 Active   b complex vitamins capsule [id number] Yes Take 1 capsule by mouth daily. [provider] 01/24/2021 Active Self  Calcium Carb-Cholecalciferol 600-200 MG-UNIT TABS [id number] Yes Take 1 tablet by mouth daily.  [provider] 01/24/2021 Active Self           Med Note Sheppard Evens, MEGAN E   Thu Jan 07, 2018  1:50 PM)    cephALEXin (KEFLEX) 250 MG capsule [id number] Yes Take 250 mg by mouth daily. [provider] 01/25/2021 Active   cholecalciferol (VITAMIN D) 1000 UNITS tablet [id number] Yes Take 2,000 Units by mouth daily. [provider] 01/25/2021 Active Self  docusate sodium (COLACE) 100 MG capsule [id number] Yes Take 100 mg by mouth daily. [provider] Past Week Active   ferrous sulfate 325 (65 FE) MG tablet [id number] No Take 325 mg by mouth every other day.   Patient not taking: Reported on 01/25/2021   [provider] Not Taking Active   gabapentin (NEURONTIN) 300 MG capsule [id number] Yes Take 600-900 mg by mouth See admin instructions. Take 300 mg every  morning and900 mg at bedtime [provider] 01/25/2021 Active Self  HYDROcodone-acetaminophen (NORCO/VICODIN) 5-325 MG tablet [id number] Yes Take 1 tablet by mouth every 4 (four) hours as needed for moderate pain or severe pain.  [provider] 01/24/2021 Active  Self           Med Note [facility], RHEA N   Wed Jul 16, 2016 11:53 AM) Received from: [place]: Take 1 tablet by mouth once daily as needed.  ipratropium (ATROVENT) 0.06 % nasal spray [id number] Yes PLACE 2 SPRAYS IN [facility] NOSTRIL 3 TIMES A DAY Burchette, Alinda Sierras, MD 01/24/2021 Active   levothyroxine (SYNTHROID) 137 MCG tablet [id number] Yes TAKE 1 TABLET (137 MCG TOTAL) BY MOUTH DAILY BEFORE BREAKFAST. Eulas Post, MD 01/25/2021 Active   loratadine-pseudoephedrine (CLARITIN-D 12-HOUR) 5-120 MG per tablet [id number] Yes Take 1 tablet by mouth daily. [provider] 01/25/2021 Active Self  methocarbamol (ROBAXIN) 750 MG tablet [id number] No Take 750 mg by mouth 2 (two) times daily as needed for muscle spasms.   Patient not taking: No sig reported   [provider] Not Taking Active   metoprolol succinate (TOPROL-XL) 50 MG 24 hr tablet [id number] Yes TAKE 1 TABLET BY MOUTH 2 TIMES DAILY. Eulas Post, MD 01/25/2021 Active   olmesartan (BENICAR) 40 MG tablet [id number] Yes TAKE 1 TABLET BY MOUTH DAILY. Eulas Post, MD 01/25/2021 Active   Soybean-Soy Isoflavones (SOY BALANCE PO) [id number] Yes Take 1 tablet by mouth daily. Soy bean extract [provider] 01/24/2021 Active Self  triamcinolone (KENALOG) 0.025 % cream [id number] No Apply 1 application topically 2 (two) times daily.  Patient not taking: No sig reported   Eulas Post, MD Not Taking Active   zolpidem (AMBIEN) 5 MG tablet [id number] Yes TAKE 1 TABLET BY MOUTH ONCE DAILY AT BEDTIME Eulas Post, MD 01/24/2021 Active           Patient Active Problem List   Diagnosis Date Noted  . Abnormal  involuntary movement 12/26/2020  . Body mass index (BMI) 27.0-27.9, adult 12/26/2020  . Chronic anxiety 12/26/2020  . Constipation 12/26/2020  . Easy bruising 12/26/2020  . Edema of lower extremity 12/26/2020  . Localized swelling, mass and lump, lower limb 12/26/2020  . Encounter for general adult medical examination with abnormal findings 12/26/2020  . Family history of breast cancer gene mutation in first degree relative 12/26/2020  . Fatty liver 12/26/2020  . Hearing loss 12/26/2020  . History of thrombophlebitis 12/26/2020  . Hyponatremia 12/26/2020  . Muscle cramps 12/26/2020  . Osteoarthritis 12/26/2020  . Osteopenia 12/26/2020  . Personal history of other malignant neoplasm of skin 12/26/2020  . Postoperative hypothyroidism 12/26/2020  . Raynaud's disease 12/26/2020  . Lymphedema of right lower extremity 10/05/2020  . Essential tremor 10/05/2019  . Near syncope 04/28/2019  . Adjustment disorder with mixed anxiety and depressed mood 04/28/2019  . Liposarcoma of right thigh ([place]) 04/28/2019  . Osteoarthritis of right knee 03/10/2019  . Hip pain, bilateral 01/07/2018  . Sinus tarsi syndrome 08/20/2016  . Right knee pain 07/17/2016  . S/p reverse total shoulder arthroplasty 12/28/2014  . Abnormality of gait 04/25/2014  . Foot pain 06/03/2011  . Metatarsalgia 06/03/2011  . Chronic pain syndrome 12/05/2009  . Chronic insomnia 08/15/2009  . INGROWN TOENAIL 06/06/2009  . LEG PAIN, BILATERAL 06/05/2009  . OTHER VITAMIN B12 DEFICIENCY ANEMIA 04/04/2009  . Megaloblastic anemia due to vitamin B12 deficiency 04/04/2009  . MALAISE AND FATIGUE 02/28/2009  . Hypothyroidism 07/26/2008  . HYPERLIPIDEMIA 11/05/2007  . NEUROPATHY, IDIOPATHIC PERIPHERAL NEC 09/16/2007  . Essential hypertension 01/28/2007  . Arthritis of hand, degenerative 01/28/2007  . ROTATOR CUFF INJURY, RIGHT SHOULDER 01/28/2007  . OSTEOPENIA 01/28/2007  . Disorder of  bone and cartilage 01/28/2007     Immunization History  Administered Date(s) Administered  . Fluad Quad(high Dose 65+) 07/29/2019  . Influenza Whole 09/08/2008  . Influenza, High Dose Seasonal PF 09/25/2014, 08/09/2015, 08/12/2016  . Influenza, Quadrivalent, Recombinant, Inj, Pf 08/19/2017, 08/26/2018  . Influenza-Unspecified 08/22/2020  . Moderna Sars-Covid-2 Vaccination 12/07/2019, 01/05/2020  . PFIZER SARS-COV-2 Pediatric Vaccination 5-53yr 09/24/2020  . Pneumococcal Conjugate-13 06/28/2013  . Pneumococcal Polysaccharide-23 11/25/2004  . Td 11/25/2004  . Tdap 06/28/2013  . Zoster 07/15/2011    Conditions to be addressed/monitored:  Hypertension, Hypothyroidism, Osteopenia, Osteoarthritis, Overactive Bladder, Allergic Rhinitis and Insomnia, Pain  There are no care plans that you recently modified to display for this patient.   Current Barriers:  . {pharmacybarriers:24917}  Pharmacist Clinical Goal(s):  .Marland KitchenPatient will {PHARMACYGOALCHOICES:24921} through collaboration with PharmD and provider.   Interventions: . 1:1 collaboration with BEulas Post MD regarding development and update of comprehensive plan of care as evidenced by provider attestation and co-signature . Inter-disciplinary care team collaboration (see longitudinal plan of care) . Comprehensive medication review performed; medication list updated in electronic medical record  BP Readings from Last 3 Encounters:  03/27/21 120/60  01/25/21 (!) 69/46  01/03/21 122/70    Hypertension (BP goal <140/90) -Controlled -Current treatment: . Amlodipine 5 mg 1 tablet daily .  Olmesartan 40 mg 1 tablet daily . Metoprolol succinate 50 mg 1 tablet twice daily -Medications previously tried: ***  -Current home readings: *** -Current dietary habits: *** -Current exercise habits: *** -{ACTIONS;DENIES/REPORTS:21021675::"Denies"} hypotensive/hypertensive symptoms -Educated on {CCM BP Counseling:25124} -Counseled to monitor BP at home ***,  document, and provide log at future appointments -{CCMPHARMDINTERVENTION:25122}  Osteoporosis / Osteopenia (Goal ***) -{US controlled/uncontrolled:25276} -Last DEXA Scan: ***   T-Score femoral neck: ***  T-Score total hip: ***  T-Score lumbar spine: ***  T-Score forearm radius: ***  10-year probability of major osteoporotic fracture: ***  10-year probability of hip fracture: *** -Patient {is;is not an osteoporosis candidate:23886} -Current treatment  . Calcium carbonate-cholecalciferol 600-200 mg-unit 1 tablet daily . Vitamin D 1000 units 1 tablet daily -Medications previously tried: ***  -{Osteoporosis Counseling:23892} -{CCMPHARMDINTERVENTION:25122}  Lab Results  Component Value Date   TSH 3.81 07/26/2020    Hypothyroidism (Goal: ***) -Controlled -Current treatment  . Levothyroxine 137 mcg 1 tablet daily before breakfast -Medications previously tried: ***  -{CCMPHARMDINTERVENTION:25122}   Allergic rhinitis (Goal: ***) -{US controlled/uncontrolled:25276} -Current treatment  . Ipratropium 0.06% nasal spray . Loratadine-pseudoephedrine daily??? -Medications previously tried: ***  -{CCMPHARMDINTERVENTION:25122}  Pain (Goal: ***) -{US controlled/uncontrolled:25276} -Current treatment  . Acetaminophen 500 mg 1 tablet as needed . Gabapentin 300 mg 1 capsule in the morning and 3 capsules at bedtime . Norco? . Methocarbamol? -Medications previously tried: ***  -{CCMPHARMDINTERVENTION:25122}  Insomnia (Goal: ***) -{US controlled/uncontrolled:25276} -Current treatment  . Zolpidem 5 mg 1 tablet at bedtime -Medications previously tried: ***  -{CCMPHARMDINTERVENTION:25122} Practicing good sleep hygiene by setting a sleep schedule and maintaining it, avoid excessive napping, following a nightly routine, avoiding screen time for 30-60 minutes before going to bed, and making the bedroom a cool, quiet and dark space  Overactive bladder (Goal: ***) -{US  controlled/uncontrolled:25276} -Current treatment  . Myrbetriq 50 mg 1 tablet daily -Medications previously tried: ***  -{CCMPHARMDINTERVENTION:25122}  Cephalexin?  Health Maintenance -Vaccine gaps: *** -Current therapy:  .Marland KitchenVitamin B complex daily . Docusate 100 mg capsule daily . Soybean balance daily . Triamcinolone 0.025% cream -Educated on {ccm supplement counseling:25128} -{CCM Patient satisfied:25129} -{CCMPHARMDINTERVENTION:25122}   Patient Goals/Self-Care Activities . Patient will:  - {  pharmacypatientgoals:24919}  Follow Up Plan: {CM FOLLOW UP BPQS:01239}  Medication Assistance: {MEDASSISTANCEINFO:25044}  Patient's preferred pharmacy is:  [place], [address] [place] [address] Phone: [phone] Fax: [phone]  [facility], [place] - [address] [place] [address] Phone: [phone] Fax: [phone]  Uses pill box? {Yes or If no, why not?:20788} Pt endorses ***% compliance  We discussed: {Pharmacy options:24294} Patient decided to: {US Pharmacy ILOK:32346}  Care Plan and Follow Up Patient Decision:  {FOLLOWUP:24991}  Plan: {CM FOLLOW UP MKTL:73081}  Jeni Salles, PharmD [place] Pharmacist [place] at [place]

## 2021-03-27 NOTE — Progress Notes (Signed)
Established Patient Office Visit  Subjective:  Patient ID: Destiny [name], female    DOB: [birth year]  Age: 80 y.o. MRN: [id number]  CC:  Chief Complaint  Patient presents with  . 6 month follow up    Concerns/ questions:  1. Pt would like to review the CT she had in January at [facility].  2. Pt wonders if she should get the 2nd booster. 3. Pt is starting to have bilateral hip pain.  -tlc,rma  . Hypertension    Taking Amlodipine 5 mg, Metoprolol 50 mg BID, Benicar 40 mg daily.  . Hypothyroidism    Taking Levothyroxine 137 mcg daily.  . Insomnia    Taking Ambien 5 mg once nightly.     HPI Destiny [name] presents for medical follow-up.  She has history of hypertension, hypothyroidism, chronic insomnia, essential tremor, history of liposarcoma right thigh.  This was diagnosed back in April 2020.  She is followed over at [facility] for that.  She did have follow-up CT abdomen pelvis which showed question of small pericardial cyst but unable to rule out possible LAD aneurysm.  Patient has had no symptoms such as chest pains, pleuritic pain, dyspnea.  She states she actually feels very well overall and better than she has in some time.  She has had a little bit of weight loss which is intentional.  She has chronic lymphedema right lower extremity from prior surgery which is unchanged.  She wears support hose daily..  Her oncology specialist over at [facility] apparently recommended follow-up PA and lateral chest x-ray and she prefers to get that here rather than going all the way over there for that.  Her medications were reviewed and include amlodipine, gabapentin, hydrocodone, levothyroxine, metoprolol, Myrbetriq, Benicar, and Ambien 5 mg nightly.    Past Medical History:  Diagnosis Date  . Anxiety   . Arthritis   . Colon polyps   . Complication of anesthesia    BP drops with fentyl and versed  . Fatty liver   . Hepatic lesion   . Hepatitis    hep A  1976  . History of  hepatitis A    1976  . Hypertension   . Hypothyroidism   . Internal hemorrhoids   . Liposarcoma of right thigh ([place]) 03/2019    Past Surgical History:  Procedure Laterality Date  . ABDOMINAL HYSTERECTOMY    . APPENDECTOMY    . BACK SURGERY      fusion  . CESAREAN SECTION     times 2  . COLONOSCOPY    . EYE SURGERY Left    cataract removed  . FINGER ARTHRODESIS Left 10/02/2017   Procedure: LEFT LONG FINGER ARTHRODESIS;  Surgeon: Milly Jakob, MD;  Location: [place];  Service: Orthopedics;  Laterality: Left;  . FOOT SURGERY Bilateral   . FRACTURE SURGERY     broken ankle  . MEDIAL PARTIAL KNEE REPLACEMENT Left    done at [place] Right 12/28/2014   Procedure: REVERSE SHOULDER ARTHROPLASTY;  Surgeon: Nita Sells, MD;  Location: [place];  Service: Orthopedics;  Laterality: Right;  Right reverse total shoulder arthroplasty  . REVERSE TOTAL SHOULDER ARTHROPLASTY Right 12/28/2014   DR CHANDLER  . rotator cufff    . THYROIDECTOMY, PARTIAL     one lobe removed  . VARICOSE VEIN SURGERY Right     Family History  Problem Relation Age of Onset  . Heart disease Mother   . Atrial  fibrillation Mother   . Cancer - Other Father   . Pancreatic cancer Father   . Pancreatic cancer Maternal Uncle   . Colon cancer Neg Hx   . Prostate cancer Neg Hx   . Rectal cancer Neg Hx   . Stomach cancer Neg Hx     Social History   Socioeconomic History  . Marital status: Married    Spouse name: Not on file  . Number of children: Not on file  . Years of education: Not on file  . Highest education level: Not on file  Occupational History  . Occupation: retired    Comment: Therapist, sports  Tobacco Use  . Smoking status: Never Smoker  . Smokeless tobacco: Never Used  Substance and Sexual Activity  . Alcohol use: Yes    Comment: rarely  . Drug use: No  . Sexual activity: Not Currently  Other Topics Concern  . Not on file  Social History  Narrative   Married   [place] 2   Lives in 1 story ranch in [place] and also has mountain home in [place] with stairs   Social Determinants of Health   Financial Resource Strain: Low Risk   . Difficulty of Paying Living Expenses: Not hard at all  Food Insecurity: No Food Insecurity  . Worried About Charity fundraiser in the Last Year: Never true  . Ran Out of Food in the Last Year: Never true  Transportation Needs: No Transportation Needs  . Lack of Transportation (Medical): No  . Lack of Transportation (Non-Medical): No  Physical Activity: Insufficiently Active  . Days of Exercise per Week: 3 days  . Minutes of Exercise per Session: 30 min  Stress: No Stress Concern Present  . Feeling of Stress : Not at all  Social Connections: Moderately Isolated  . Frequency of Communication with Friends and Family: Twice a week  . Frequency of Social Gatherings with Friends and Family: Once a week  . Attends Religious Services: Never  . Active Member of Clubs or Organizations: No  . Attends Archivist Meetings: Never  . Marital Status: Married  Human resources officer Violence: Not At Risk  . Fear of Current or Ex-Partner: No  . Emotionally Abused: No  . Physically Abused: No  . Sexually Abused: No    Outpatient Medications Prior to Visit  Medication Sig Dispense Refill  . acetaminophen (TYLENOL) 500 MG tablet Take 500 mg by mouth every 4 (four) hours as needed.    Mar[place] Kitchen amLODipine (NORVASC) 5 MG tablet TAKE 1 TABLET BY MOUTH DAILY. 90 tablet 1  . b complex vitamins capsule Take 1 capsule by mouth daily.    . Calcium Carb-Cholecalciferol 600-200 MG-UNIT TABS Take 1 tablet by mouth daily.     . cephALEXin (KEFLEX) 250 MG capsule Take 250 mg by mouth daily.    . cholecalciferol (VITAMIN D) 1000 UNITS tablet Take 2,000 Units by mouth daily.    Marland Kitchen docusate sodium (COLACE) 100 MG capsule Take 100 mg by mouth daily.    . ferrous sulfate 325 (65 FE) MG tablet Take 325 mg by mouth every other  day.    . gabapentin (NEURONTIN) 300 MG capsule Take 600-900 mg by mouth See admin instructions. Take 300 mg every morning and900 mg at bedtime    . HYDROcodone-acetaminophen (NORCO/VICODIN) 5-325 MG tablet Take 1 tablet by mouth every 4 (four) hours as needed for moderate pain or severe pain.     Marland Kitchen ipratropium (ATROVENT) 0.06 % nasal spray  PLACE 2 SPRAYS IN EACH NOSTRIL 3 TIMES A DAY 15 mL 6  . levothyroxine (SYNTHROID) 137 MCG tablet TAKE 1 TABLET (137 MCG TOTAL) BY MOUTH DAILY BEFORE BREAKFAST. 90 tablet 0  . loratadine-pseudoephedrine (CLARITIN-D 12-HOUR) 5-120 MG per tablet Take 1 tablet by mouth daily.    . methocarbamol (ROBAXIN) 750 MG tablet Take 750 mg by mouth 2 (two) times daily as needed for muscle spasms.    . metoprolol succinate (TOPROL-XL) 50 MG 24 hr tablet TAKE 1 TABLET BY MOUTH 2 TIMES DAILY. 180 tablet 1  . olmesartan (BENICAR) 40 MG tablet TAKE 1 TABLET BY MOUTH DAILY. 90 tablet 1  . Soybean-Soy Isoflavones (SOY BALANCE PO) Take 1 tablet by mouth daily. Soy bean extract    . triamcinolone (KENALOG) 0.025 % cream Apply 1 application topically 2 (two) times daily. 15 g 1  . zolpidem (AMBIEN) 5 MG tablet TAKE 1 TABLET BY MOUTH ONCE DAILY AT BEDTIME 90 tablet 0  . MYRBETRIQ 50 MG TB24 tablet Take 50 mg by mouth daily.     No facility-administered medications prior to visit.    Allergies  Allergen Reactions  . Nsaids Other (See Comments)     BRUISES EASILY Other Reaction: BRUISES EASILY Other Reaction: BRUISES EASILY   . Oxycodone-Acetaminophen Other (See Comments)     VERY CONSTIPATING, does not provide pain relief  . Aspirin Other (See Comments)    Bruising   . Augmentin [Amoxicillin-Pot Clavulanate] Other (See Comments)    headache  . Ciprocin-Fluocin-Procin [Fluocinolone]   . Ciprofloxacin Hcl     tendonitis  . Fentanyl Other (See Comments)    Does not work  . Fluclorolone Other (See Comments)  . Lyrica [Pregabalin] Other (See Comments)    dysphoria  .  Oxycodone Other (See Comments)    Does not work  . Versed [Midazolam] Other (See Comments)    Does not work  . Diclofenac Sodium Other (See Comments) and Rash    Skin peeling. Voltaren gel-rash and skin peeling   . Hydromorphone Hcl Itching     Itching with large doses - able to tolerate lower doses or shorter duration  . Voltaren Gel [Diclofenac Sodium] Rash and Other (See Comments)    Skin peeling.    ROS Review of Systems  Constitutional: Negative for chills, fever and unexpected weight change.  Respiratory: Negative for cough and shortness of breath.   Cardiovascular: Negative for chest pain.  Gastrointestinal: Negative for abdominal pain.  Genitourinary: Negative for dysuria.  Neurological: Negative for weakness.  Hematological: Negative for adenopathy.      Objective:    Physical Exam Vitals reviewed.  Constitutional:      Appearance: Normal appearance.  Cardiovascular:     Rate and Rhythm: Normal rate and regular rhythm.  Pulmonary:     Effort: Pulmonary effort is normal.     Breath sounds: Normal breath sounds.  Musculoskeletal:     Comments: She has support hose on right lower extremity.  Left lower extremity reveals no edema.  Neurological:     Mental Status: She is alert.     BP 120/60 (BP Location: Left Arm, Patient Position: Sitting, Cuff Size: Normal)   Pulse 67   Temp 98.1 F (36.7 C) (Oral)   Resp 18   Ht 5\' 1"  (1.549 m)   Wt 156 lb 12.8 oz (71.1 kg)   SpO2 96%   BMI 29.63 kg/m  Wt Readings from Last 3 Encounters:  03/27/21 156 lb 12.8 oz (71.1 kg)  01/25/21 161 lb (73 kg)  01/03/21 161 lb 12.8 oz (73.4 kg)     Health Maintenance Due  Topic Date Due  . DEXA SCAN  Never done  . COVID-19 Vaccine (3 - Moderna risk 4-dose series) 10/22/2020    There are no preventive care reminders to display for this patient.  Lab Results  Component Value Date   TSH 3.81 07/26/2020   Lab Results  Component Value Date   WBC 7.0 04/03/2020   HGB  12.1 04/03/2020   HCT 35.4 (L) 04/03/2020   MCV 103.2 (H) 04/03/2020   PLT 325.0 04/03/2020   Lab Results  Component Value Date   NA 136 04/03/2020   K 5.1 04/03/2020   CO2 29 04/03/2020   GLUCOSE 100 (H) 04/03/2020   BUN 27 (H) 04/03/2020   CREATININE 0.89 04/03/2020   BILITOT 0.7 04/28/2019   ALKPHOS 50 04/28/2019   AST 19 04/28/2019   ALT 14 04/28/2019   PROT 6.0 (L) 04/28/2019   ALBUMIN 3.3 (L) 04/28/2019   CALCIUM 9.1 04/03/2020   ANIONGAP 9 04/29/2019   GFR 60.85 04/03/2020   Lab Results  Component Value Date   CHOL 177 12/05/2009   Lab Results  Component Value Date   HDL 46 12/05/2009   Lab Results  Component Value Date   LDLCALC 89 12/05/2009   Lab Results  Component Value Date   TRIG 210 (H) 12/05/2009   Lab Results  Component Value Date   CHOLHDL 3.8 Ratio 12/05/2009   No results found for: HGBA1C    Assessment & Plan:   #1 history of liposarcoma right thigh.  Recent CT abdomen pelvis showed incidental finding of probable small pericardial cyst.  They could not rule out LAD aneurysm.  We discussed further imaging or evaluation such as echo but at this point she declines. -Her oncologist has requested follow-up PA and lateral chest x-ray and this will be done today and over read by radiology  #2 hypertension stable and well-controlled.  Refilled all of her blood pressure medications today for 1 year  #3 hypothyroidism.  TSH was at goal last visit.  We will recheck yearly  #4 chronic insomnia stable on low-dose Ambien.  She has tried multiple times to sleep without this without good success.  No orders of the defined types were placed in this encounter.   Follow-up: Return in about 6 months (around 09/27/2021).    [name], MD

## 2021-04-03 ENCOUNTER — Ambulatory Visit: Payer: Medicare Other | Admitting: Family Medicine

## 2021-05-16 ENCOUNTER — Other Ambulatory Visit: Payer: Self-pay | Admitting: Family Medicine

## 2021-05-16 NOTE — Telephone Encounter (Signed)
Last filled 02/01/2021 Last OV 03/27/2021  Ok to fill?

## 2021-05-23 ENCOUNTER — Other Ambulatory Visit: Payer: Self-pay | Admitting: Family Medicine

## 2021-05-23 NOTE — Telephone Encounter (Signed)
This prescription was printed on 05/16/2021. I canceled  the prescription that printed so it wouldn't show that she had refills when she does not.    Last filled 02/01/2021 Last OV 03/27/2021  Ok to fill?

## 2021-06-26 ENCOUNTER — Other Ambulatory Visit: Payer: Self-pay | Admitting: Family Medicine

## 2021-09-18 ENCOUNTER — Encounter: Payer: Self-pay | Admitting: Family Medicine

## 2021-09-19 NOTE — Telephone Encounter (Signed)
Yes I can pull up all these results

## 2021-09-26 ENCOUNTER — Other Ambulatory Visit: Payer: Self-pay

## 2021-09-27 ENCOUNTER — Ambulatory Visit (INDEPENDENT_AMBULATORY_CARE_PROVIDER_SITE_OTHER): Payer: Medicare Other | Admitting: Family Medicine

## 2021-09-27 ENCOUNTER — Other Ambulatory Visit: Payer: Self-pay | Admitting: Family Medicine

## 2021-09-27 VITALS — BP 130/60 | HR 73 | Temp 98.0°F | Wt 154.9 lb

## 2021-09-27 DIAGNOSIS — E785 Hyperlipidemia, unspecified: Secondary | ICD-10-CM

## 2021-09-27 DIAGNOSIS — I10 Essential (primary) hypertension: Secondary | ICD-10-CM

## 2021-09-27 DIAGNOSIS — F419 Anxiety disorder, unspecified: Secondary | ICD-10-CM | POA: Diagnosis not present

## 2021-09-27 DIAGNOSIS — E039 Hypothyroidism, unspecified: Secondary | ICD-10-CM

## 2021-09-27 LAB — BASIC METABOLIC PANEL
BUN: 24 mg/dL — ABNORMAL HIGH (ref 6–23)
CO2: 29 mEq/L (ref 19–32)
Calcium: 8.9 mg/dL (ref 8.4–10.5)
Chloride: 98 mEq/L (ref 96–112)
Creatinine, Ser: 0.89 mg/dL (ref 0.40–1.20)
GFR: 60.27 mL/min (ref >60.00)
Glucose, Bld: 86 mg/dL (ref 70–99)
Potassium: 4.9 mEq/L (ref 3.5–5.1)
Sodium: 135 mEq/L (ref 135–145)

## 2021-09-27 LAB — TSH: TSH: 1.22 u[IU]/mL (ref 0.35–5.50)

## 2021-09-27 MED ORDER — LEVOTHYROXINE SODIUM 137 MCG PO TABS: 137.0000 ug | ORAL_TABLET | Freq: Every day | ORAL | 3 refills | Status: DC

## 2021-09-27 NOTE — Progress Notes (Signed)
Established Patient Office Visit  Subjective:  Patient ID: Destiny [name], female    DOB: 09/25/1941  Age: 80 y.o. MRN: [id number]  CC:  Chief Complaint  Patient presents with   Follow-up    HPI Destiny [name] presents for medical follow-up.  She and her husband are looking at moving to friends homes.  [place] and they still have at home at [facility].  Patient has history of liposarcoma right thigh.  Followed closely at [facility].  She is getting regular imaging through them with no signs of recurrence recently.  Her other chronic problems include history of hypertension, hypothyroidism, essential tremor, osteoarthritis involving multiple joints, chronic insomnia, hyperlipidemia  She is due for some follow-up labs today.  Her current medications include levothyroxine, Toprol-XL, Benicar, amlodipine, Ambien, Myrbetriq  Past Medical History:  Diagnosis Date   Anxiety    Arthritis    Colon polyps    Complication of anesthesia    BP drops with fentyl and versed   Fatty liver    Hepatic lesion    Hepatitis    hep A  1976   History of hepatitis A    1976   Hypertension    Hypothyroidism    Internal hemorrhoids    Liposarcoma of right thigh ([place]) 03/2019    Past Surgical History:  Procedure Laterality Date   ABDOMINAL HYSTERECTOMY     APPENDECTOMY     BACK SURGERY      fusion   CESAREAN SECTION     times 2   COLONOSCOPY     EYE SURGERY Left    cataract removed   FINGER ARTHRODESIS Left 10/02/2017   Procedure: LEFT LONG FINGER ARTHRODESIS;  Surgeon: Milly Jakob, MD;  Location: [place];  Service: Orthopedics;  Laterality: Left;   FOOT SURGERY Bilateral    FRACTURE SURGERY     broken ankle   MEDIAL PARTIAL KNEE REPLACEMENT Left    done at [place] Right 12/28/2014   Procedure: REVERSE SHOULDER ARTHROPLASTY;  Surgeon: Nita Sells, MD;  Location: [place];  Service: Orthopedics;  Laterality: Right;  Right reverse total shoulder arthroplasty   REVERSE TOTAL SHOULDER ARTHROPLASTY Right 12/28/2014   DR CHANDLER   rotator cufff     THYROIDECTOMY, PARTIAL     one lobe removed   VARICOSE VEIN SURGERY Right     Family History  Problem Relation Age of Onset   Heart disease Mother    Atrial fibrillation Mother    Cancer - Other Father    Pancreatic cancer Father    Pancreatic cancer Maternal Uncle    Colon cancer Neg Hx    Prostate cancer Neg Hx    Rectal cancer Neg Hx    Stomach cancer Neg Hx     Social History   Socioeconomic History   Marital status: Married    Spouse name: Not on file   Number of children: Not on file   Years of education: Not on file   Highest education level: Not on file  Occupational History   Occupation: retired    Comment: Therapist, sports  Tobacco Use   Smoking status: Never   Smokeless tobacco: Never  Substance and Sexual Activity   Alcohol use: Yes    Comment: rarely   Drug use: No   Sexual activity: Not Currently  Other Topics Concern   Not on file  Social History Narrative   Married   [place] 2   Lives in 1 story ranch in [place] and also has mountain home in [place] with stairs   Social Determinants of Health   Financial Resource Strain: Low Risk    Difficulty of Paying Living Expenses: Not hard at all  Food Insecurity: No Food Insecurity   Worried About Charity fundraiser in the Last Year: Never true   Arboriculturist in the Last Year: Never true  Transportation Needs: No Transportation Needs   Lack of Transportation (Medical): No   Lack of Transportation (Non-Medical): No  Physical Activity: Insufficiently Active   Days of Exercise per Week: 3 days   Minutes of Exercise per Session: 30 min  Stress: No Stress Concern Present   Feeling of Stress : Not at all  Social Connections: Moderately Isolated   Frequency of Communication with Friends and Family: Twice a  week   Frequency of Social Gatherings with Friends and Family: Once a week   Attends Religious Services: Never   Marine scientist or Organizations: No   Attends Music therapist: Never   Marital Status: Married  Human resources officer Violence: Not At Risk   Fear of Current or Ex-Partner: No   Emotionally Abused: No   Physically Abused: No   Sexually Abused: No    Outpatient Medications Prior to Visit  Medication Sig Dispense Refill   acetaminophen (TYLENOL) 500 MG tablet Take 500 mg by mouth every 4 (four) hours as needed.     amLODipine (NORVASC) 5 MG tablet TAKE 1 TABLET BY MOUTH DAILY. 90 tablet 1   b complex vitamins capsule Take 1 capsule by mouth daily.     Calcium Carb-Cholecalciferol 600-200 MG-UNIT TABS Take 1 tablet by mouth daily.      cephALEXin (KEFLEX) 250 MG capsule Take 250 mg by mouth daily.     cholecalciferol (VITAMIN D) 1000 UNITS tablet Take 2,000 Units by mouth daily.     docusate sodium (COLACE) 100 MG capsule Take 100 mg by mouth daily.     gabapentin (NEURONTIN) 300 MG capsule Take 600-900 mg by mouth See admin instructions. Take 300 mg every morning and900 mg at bedtime     HYDROcodone-acetaminophen (NORCO/VICODIN) 5-325 MG tablet Take 1 tablet by mouth every 4 (four) hours as needed for moderate pain or severe pain.      ipratropium (ATROVENT) 0.06 % nasal spray PLACE 2 SPRAYS IN EACH NOSTRIL 3 TIMES A DAY 15 mL 6   loratadine-pseudoephedrine (CLARITIN-D 12-HOUR) 5-120 MG per tablet Take 1 tablet by mouth daily.     methocarbamol (ROBAXIN) 750 MG tablet Take 750 mg by mouth 2 (two) times daily as needed for muscle spasms.     metoprolol succinate (TOPROL-XL) 50 MG 24 hr tablet TAKE 1 TABLET BY MOUTH 2 TIMES DAILY. 180 tablet 1   MYRBETRIQ 50 MG TB24 tablet Take 50 mg by mouth daily.     olmesartan (BENICAR) 40 MG tablet TAKE 1 TABLET BY MOUTH DAILY. 90 tablet 1   Soybean-Soy Isoflavones (SOY BALANCE PO) Take 1 tablet by mouth daily. Soy bean  extract     zolpidem (AMBIEN) 5 MG tablet TAKE 1 TABLET BY MOUTH ONCE DAILY AT BEDTIME 90 tablet 1   levothyroxine (SYNTHROID) 137 MCG tablet TAKE 1 TABLET (137 MCG TOTAL) BY MOUTH DAILY BEFORE BREAKFAST. 90 tablet 0   ferrous sulfate 325 (65 FE) MG tablet Take 325 mg by mouth every other  day.     triamcinolone (KENALOG) 0.025 % cream Apply 1 application topically 2 (two) times daily. 15 g 1   No facility-administered medications prior to visit.    Allergies  Allergen Reactions   Nsaids Other (See Comments)     BRUISES EASILY Other Reaction: BRUISES EASILY Other Reaction: BRUISES EASILY    Oxycodone-Acetaminophen Other (See Comments)     VERY CONSTIPATING, does not provide pain relief   Aspirin Other (See Comments)    Bruising    Augmentin [Amoxicillin-Pot Clavulanate] Other (See Comments)    headache   Ciprocin-Fluocin-Procin [Fluocinolone]    Ciprofloxacin Hcl     tendonitis   Fentanyl Other (See Comments)    Does not work   Building surveyor Other (See Comments)   Lyrica [Pregabalin] Other (See Comments)    dysphoria   Oxycodone Other (See Comments)    Does not work   Versed [employer] Other (See Comments)    Does not work   Diclofenac Sodium Other (See Comments) and Rash    Skin peeling. Voltaren gel-rash and skin peeling    Hydromorphone Hcl Itching     Itching with large doses - able to tolerate lower doses or shorter duration   Voltaren Gel [Diclofenac Sodium] Rash and Other (See Comments)    Skin peeling.    ROS Review of Systems  Constitutional:  Negative for fatigue.  Eyes:  Negative for visual disturbance.  Respiratory:  Negative for cough, chest tightness, shortness of breath and wheezing.   Cardiovascular:  Negative for chest pain, palpitations and leg swelling.  Endocrine: Negative for polydipsia and polyuria.  Musculoskeletal:  Positive for arthralgias.  Neurological:  Negative for dizziness, seizures, syncope, weakness, light-headedness and headaches.      Objective:    Physical Exam Constitutional:      Appearance: She is well-developed.  Eyes:     Pupils: Pupils are equal, round, and reactive to light.  Neck:     Thyroid: No thyromegaly.     Vascular: No JVD.  Cardiovascular:     Rate and Rhythm: Normal rate and regular rhythm.     Heart sounds:    No gallop.  Pulmonary:     Effort: Pulmonary effort is normal. No respiratory distress.     Breath sounds: Normal breath sounds. No wheezing or rales.  Musculoskeletal:     Cervical back: Neck supple.  Neurological:     Mental Status: She is alert.    BP 130/60 (BP Location: Left Arm, Patient Position: Sitting, Cuff Size: Normal)   Pulse 73   Temp 98 F (36.7 C) (Oral)   Wt 154 lb 14.4 oz (70.3 kg)   SpO2 95%   BMI 29.27 kg/m  Wt Readings from Last 3 Encounters:  09/27/21 154 lb 14.4 oz (70.3 kg)  03/27/21 156 lb 12.8 oz (71.1 kg)  01/25/21 161 lb (73 kg)     Health Maintenance Due  Topic Date Due   Zoster Vaccines- Shingrix (1 of 2) Never done   DEXA SCAN  Never done   COVID-19 Vaccine (4 - Booster) 06/24/2021    There are no preventive care reminders to display for this patient.  Lab Results  Component Value Date   TSH 3.81 07/26/2020   Lab Results  Component Value Date   WBC 7.0 04/03/2020   HGB 12.1 04/03/2020   HCT 35.4 (L) 04/03/2020   MCV 103.2 (H) 04/03/2020   PLT 325.0 04/03/2020   Lab Results  Component Value Date   NA  136 04/03/2020   K 5.1 04/03/2020   CO2 29 04/03/2020   GLUCOSE 100 (H) 04/03/2020   BUN 27 (H) 04/03/2020   CREATININE 0.89 04/03/2020   BILITOT 0.7 04/28/2019   ALKPHOS 50 04/28/2019   AST 19 04/28/2019   ALT 14 04/28/2019   PROT 6.0 (L) 04/28/2019   ALBUMIN 3.3 (L) 04/28/2019   CALCIUM 9.1 04/03/2020   ANIONGAP 9 04/29/2019   GFR 60.85 04/03/2020   Lab Results  Component Value Date   CHOL 177 12/05/2009   Lab Results  Component Value Date   HDL 46 12/05/2009   Lab Results  Component Value Date    LDLCALC 89 12/05/2009   Lab Results  Component Value Date   TRIG 210 (H) 12/05/2009   Lab Results  Component Value Date   CHOLHDL 3.8 Ratio 12/05/2009   No results found for: HGBA1C    Assessment & Plan:   #1 hypothyroidism.  Patient due for follow-up labs.  Recheck TSH.  Refill levothyroxine for 1 year  #2 hypertension stable and at goal.  Continue amlodipine, metoprolol, losartan.  Check basic metabolic panel  #3 history of essential tremor currently stable on low-dose Toprol  #4 history of liposarcoma right thigh.  Patient in remission.  Followed closely by oncology through [place]  #5 social.  Forms completed for her to live at [facility].   Meds ordered this encounter  Medications   levothyroxine (SYNTHROID) 137 MCG tablet    Sig: Take 1 tablet (137 mcg total) by mouth daily before breakfast.    Dispense:  90 tablet    Refill:  3    Follow-up: Return in about 6 months (around 03/27/2022).    [name], MD

## 2021-10-09 ENCOUNTER — Encounter: Payer: Self-pay | Admitting: Rehabilitation

## 2021-10-09 ENCOUNTER — Other Ambulatory Visit: Payer: Self-pay

## 2021-10-09 ENCOUNTER — Ambulatory Visit: Payer: Medicare Other | Attending: Orthopedic Surgery | Admitting: Rehabilitation

## 2021-10-09 DIAGNOSIS — I890 Lymphedema, not elsewhere classified: Secondary | ICD-10-CM | POA: Insufficient documentation

## 2021-10-09 NOTE — Therapy (Addendum)
[facility] @ [facility] [place], [place], [address] Phone: [phone]   Fax:  [phone]  Physical Therapy Treatment  Patient Details  Name: Destiny [name] MRN: [id number] Date of Birth: 23-Sep-1941 No data recorded  Encounter Date: 10/09/2021   PT End of Session - 10/09/21 1321     Visit Number 19    Number of Visits 20    Date for PT Re-Evaluation 04/08/22    PT Start Time 1300    PT Stop Time 1320    PT Time Calculation (min) 20 min    Activity Tolerance Patient tolerated treatment well    Behavior During Therapy [facility] for tasks assessed/performed             Past Medical History:  Diagnosis Date   Anxiety    Arthritis    Colon polyps    Complication of anesthesia    BP drops with fentyl and versed   Fatty liver    Hepatic lesion    Hepatitis    hep A  1976   History of hepatitis A    1976   Hypertension    Hypothyroidism    Internal hemorrhoids    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    Past Surgical History:  Procedure Laterality Date   ABDOMINAL HYSTERECTOMY     APPENDECTOMY     BACK SURGERY      fusion   CESAREAN SECTION     times 2   COLONOSCOPY     EYE SURGERY Left    cataract removed   FINGER ARTHRODESIS Left 10/02/2017   Procedure: LEFT LONG FINGER ARTHRODESIS;  Surgeon: Milly Jakob, MD;  Location: [facility];  Service: Orthopedics;  Laterality: Left;   FOOT SURGERY Bilateral    FRACTURE SURGERY     broken ankle   MEDIAL PARTIAL KNEE REPLACEMENT Left    done at [facility] Right 12/28/2014   Procedure: REVERSE SHOULDER ARTHROPLASTY;  Surgeon: Nita Sells, MD;  Location: [facility];  Service: Orthopedics;  Laterality: Right;  Right reverse total shoulder arthroplasty   REVERSE TOTAL SHOULDER ARTHROPLASTY Right 12/28/2014   DR CHANDLER   rotator cufff     THYROIDECTOMY, PARTIAL     one lobe removed   VARICOSE VEIN  SURGERY Right     There were no vitals filed for this visit.   Subjective Assessment - 10/09/21 1256     Subjective As long as I keep it wrapped it seems okay.  I use use the flexitouch machine every night for 1.5 hours.    Pertinent History Liposarcoma of the Rt thigh with sartorious removed 11.5 cm intramuscular mass. Radical resection performed 04/15/19. Finished radiation 08/12/2019. Most recent follow up show continued NED and no METS in October of 2022.    Diagnostic tests showing continued post op seroma in the surgical site slightly increased since last MRI    Currently in Pain? No/denies                   LYMPHEDEMA/ONCOLOGY QUESTIONNAIRE - 10/09/21 0001       Right Lower Extremity Lymphedema   20 cm Proximal to Suprapatella 52.5 cm    10 cm Proximal to Suprapatella 41.4 cm    At Midpatella/Popliteal Crease 38.5 cm    30 cm Proximal to Floor at Lateral Plantar Foot 37 cm    20 cm Proximal to Floor at Lateral Plantar Foot 30.3  1    10 cm Proximal to Floor at Lateral Malleoli 24 cm    Across MTP Joint 22 cm    Around Proximal Great Toe 7.5 cm                Flowsheet Row Outpatient Rehab from 02/16/2020 in [place]  Lymphedema Life Impact Scale Total Score 25 %                           PT Long Term Goals - 10/09/21 1323       PT LONG TERM GOAL #1   Title Pt will be ind with compression pump and/or self MLD for Rt LE lymphedema    Status Achieved      PT LONG TERM GOAL #2   Title Pt will be ind with self bandaging or use of compression for the LE    Status Achieved                   Plan - 10/09/21 1322     Clinical Impression Statement Pt returns to check in regarding measurements and self care.  Pt is doing very well independently.  Has been using her flexitouch, bandaging, using wraps, and has reduced the LE another average of 1.5cm since last visits.  No changes to POC needed.     PT Treatment/Interventions Therapeutic exercise;Therapeutic activities;Neuromuscular re-education;Manual techniques    PT Next Visit Plan measure LE, any questions or changes?    Consulted and Agree with Plan of Care Patient             Patient will benefit from skilled therapeutic intervention in order to improve the following deficits and impairments:     Visit Diagnosis: Lymphedema     Problem List Patient Active Problem List   Diagnosis Date Noted   Abnormal involuntary movement 12/26/2020   Body mass index (BMI) 27.0-27.9, adult 12/26/2020   Chronic anxiety 12/26/2020   Constipation 12/26/2020   Easy bruising 12/26/2020   Edema of lower extremity 12/26/2020   Localized swelling, mass and lump, lower limb 12/26/2020   Encounter for general adult medical examination with abnormal findings 12/26/2020   Family history of breast cancer gene mutation in first degree relative 12/26/2020   Fatty liver 12/26/2020   Hearing loss 12/26/2020   History of thrombophlebitis 12/26/2020   Hyponatremia 12/26/2020   Muscle cramps 12/26/2020   Osteoarthritis 12/26/2020   Osteopenia 12/26/2020   Personal history of other malignant neoplasm of skin 12/26/2020   Postoperative hypothyroidism 12/26/2020   Raynaud's disease 12/26/2020   Lymphedema of right lower extremity 10/05/2020   Essential tremor 10/05/2019   Near syncope 04/28/2019   Adjustment disorder with mixed anxiety and depressed mood 04/28/2019   Liposarcoma of right thigh ([place]) 04/28/2019   Osteoarthritis of right knee 03/10/2019   Hip pain, bilateral 01/07/2018   Sinus tarsi syndrome 08/20/2016   Right knee pain 07/17/2016   S/p reverse total shoulder arthroplasty 12/28/2014   Abnormality of gait 04/25/2014   Foot pain 06/03/2011   Metatarsalgia 06/03/2011   Chronic pain syndrome 12/05/2009   Chronic insomnia 08/15/2009   INGROWN TOENAIL 06/06/2009   LEG PAIN, BILATERAL 06/05/2009   OTHER VITAMIN B12 DEFICIENCY  ANEMIA 04/04/2009   Megaloblastic anemia due to vitamin B12 deficiency 04/04/2009   MALAISE AND FATIGUE 02/28/2009   Hypothyroidism 07/26/2008   Hyperlipidemia 11/05/2007   NEUROPATHY, IDIOPATHIC PERIPHERAL NEC 09/16/2007   Essential hypertension 01/28/2007  Arthritis of hand, degenerative 01/28/2007   ROTATOR CUFF INJURY, RIGHT SHOULDER 01/28/2007   OSTEOPENIA 01/28/2007   Disorder of bone and cartilage 01/28/2007    Stark Bray, PT 10/09/2021, 1:24 PM  [facility] @ [facility] [place], [place], [address] Phone: [phone]   Fax:  [phone]  Name: Destiny [name] MRN: [id number] Date of Birth: [birth year]   PHYSICAL THERAPY DISCHARGE SUMMARY  Visits from Start of Care: 19  Current functional level related to goals / functional outcomes: See above   Remaining deficits: Chronic lymphedema   Education / Equipment: Self care for lymphedema  Plan: Patient agrees to discharge.  Patient goals were not met. Patient is being discharged due to meeting the stated rehab goals.

## 2021-11-16 ENCOUNTER — Other Ambulatory Visit: Payer: Self-pay

## 2021-11-16 ENCOUNTER — Emergency Department (HOSPITAL_BASED_OUTPATIENT_CLINIC_OR_DEPARTMENT_OTHER): Payer: Medicare Other

## 2021-11-16 ENCOUNTER — Emergency Department (HOSPITAL_BASED_OUTPATIENT_CLINIC_OR_DEPARTMENT_OTHER)
Admission: EM | Admit: 2021-11-16 | Discharge: 2021-11-16 | Disposition: A | Discharge status: Home / Self Care | Payer: Medicare Other | Attending: Student | Admitting: Student

## 2021-11-16 ENCOUNTER — Encounter (HOSPITAL_BASED_OUTPATIENT_CLINIC_OR_DEPARTMENT_OTHER): Payer: Self-pay

## 2021-11-16 DIAGNOSIS — R159 Full incontinence of feces: Secondary | ICD-10-CM | POA: Diagnosis not present

## 2021-11-16 DIAGNOSIS — R140 Abdominal distension (gaseous): Secondary | ICD-10-CM | POA: Insufficient documentation

## 2021-11-16 DIAGNOSIS — Z96652 Presence of left artificial knee joint: Secondary | ICD-10-CM | POA: Insufficient documentation

## 2021-11-16 DIAGNOSIS — Z96611 Presence of right artificial shoulder joint: Secondary | ICD-10-CM | POA: Insufficient documentation

## 2021-11-16 DIAGNOSIS — I10 Essential (primary) hypertension: Secondary | ICD-10-CM | POA: Diagnosis not present

## 2021-11-16 DIAGNOSIS — K5900 Constipation, unspecified: Secondary | ICD-10-CM | POA: Insufficient documentation

## 2021-11-16 DIAGNOSIS — E039 Hypothyroidism, unspecified: Secondary | ICD-10-CM | POA: Diagnosis not present

## 2021-11-16 DIAGNOSIS — Z79899 Other long term (current) drug therapy: Secondary | ICD-10-CM | POA: Insufficient documentation

## 2021-11-16 MED ORDER — POLYETHYLENE GLYCOL 3350 17 G PO PACK: 17.0000 g | PACK | Freq: Every day | ORAL | 0 refills | Status: AC

## 2021-11-16 MED ORDER — SORBITOL 70 % SOLN: 960.0000 mL | TOPICAL_OIL | Freq: Once | ORAL | Status: DC

## 2021-11-16 MED ORDER — MIDAZOLAM HCL 2 MG/2ML IJ SOLN
2.0000 mg | Freq: Once | INTRAMUSCULAR | Status: AC
  Administered 2021-11-16: 20:00:00 2 mg via INTRAVENOUS

## 2021-11-16 MED ORDER — MIDAZOLAM HCL 2 MG/2ML IJ SOLN
1.0000 mg | Freq: Once | INTRAMUSCULAR | Status: DC
  Filled 2021-11-16: qty 2

## 2021-11-16 NOTE — ED Triage Notes (Signed)
Pt states that she is impacted. Pt states she has taken laxatives (bisacodyl) without relief, along with mag citrate. Pt states that she is now leaking feces, but is unable to disimpact herself.

## 2021-11-16 NOTE — ED Provider Notes (Signed)
[facility] EMERGENCY DEPT Provider Note   CSN: [id number] Arrival date & time: 11/16/21  1610     History Chief Complaint  Patient presents with   Fecal Impaction    Destiny [name] is a 80 y.o. female who presents emergency department for evaluation of constipation.  Patient states that she frequently has episodes of constipation and she can usually treat this with laxatives at home, she is has been unable to have a bowel movement in multiple days and she is unable to disimpact herself at home.  She arrives with complaints of abdominal fullness, fecal incontinence.  Denies chest pain, shortness of breath, headache, vomiting or other systemic symptoms.  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    Patient Active Problem List   Diagnosis Date Noted   Abnormal involuntary movement 12/26/2020   Body mass index (BMI) 27.0-27.9, adult 12/26/2020   Chronic anxiety 12/26/2020   Constipation 12/26/2020   Easy bruising 12/26/2020   Edema of lower extremity 12/26/2020   Localized swelling, mass and lump, lower limb 12/26/2020   Encounter for general adult medical examination with abnormal findings 12/26/2020   Family history of breast cancer gene mutation in first degree relative 12/26/2020   Fatty liver 12/26/2020   Hearing loss 12/26/2020   History of thrombophlebitis 12/26/2020   Hyponatremia 12/26/2020   Muscle cramps 12/26/2020   Osteoarthritis 12/26/2020   Osteopenia 12/26/2020   Personal history of other malignant neoplasm of skin 12/26/2020   Postoperative hypothyroidism 12/26/2020   Raynaud's disease 12/26/2020   Lymphedema of right lower  extremity 10/05/2020   Essential tremor 10/05/2019   Near syncope 04/28/2019   Adjustment disorder with mixed anxiety and depressed mood 04/28/2019   Liposarcoma of right thigh ([place]) 04/28/2019   Osteoarthritis of right knee 03/10/2019   Hip pain, bilateral 01/07/2018   Sinus tarsi syndrome 08/20/2016   Right knee pain 07/17/2016   S/p reverse total shoulder arthroplasty 12/28/2014   Abnormality of gait 04/25/2014   Foot pain 06/03/2011   Metatarsalgia 06/03/2011   Chronic pain syndrome 12/05/2009   Chronic insomnia 08/15/2009   INGROWN TOENAIL 06/06/2009   LEG PAIN, BILATERAL 06/05/2009   OTHER VITAMIN B12 DEFICIENCY ANEMIA 04/04/2009   Megaloblastic anemia due to vitamin B12 deficiency 04/04/2009   MALAISE AND FATIGUE 02/28/2009   Hypothyroidism 07/26/2008   Hyperlipidemia 11/05/2007   NEUROPATHY, IDIOPATHIC PERIPHERAL NEC 09/16/2007   Essential hypertension 01/28/2007   Arthritis of hand, degenerative 01/28/2007   ROTATOR CUFF INJURY, RIGHT SHOULDER 01/28/2007   OSTEOPENIA 01/28/2007   Disorder of bone and cartilage 01/28/2007    Past Surgical History:  Procedure Laterality Date   ABDOMINAL HYSTERECTOMY     APPENDECTOMY     BACK SURGERY      fusion   CESAREAN SECTION     times 2   COLONOSCOPY     EYE SURGERY Left    cataract removed   FINGER ARTHRODESIS Left 10/02/2017   Procedure: LEFT LONG FINGER ARTHRODESIS;  Surgeon: Milly Jakob, MD;  Location: [facility];  Service: Orthopedics;  Laterality: Left;   FOOT SURGERY Bilateral    FRACTURE SURGERY     broken ankle   MEDIAL PARTIAL KNEE REPLACEMENT Left    done at [facility] Right 12/28/2014   Procedure: REVERSE SHOULDER ARTHROPLASTY;  Surgeon: Nita Sells, MD;  Location: [facility];  Service: Orthopedics;  Laterality: Right;  Right reverse total shoulder arthroplasty   REVERSE TOTAL SHOULDER ARTHROPLASTY Right 12/28/2014   DR CHANDLER   rotator cufff      THYROIDECTOMY, PARTIAL     one lobe removed   VARICOSE VEIN SURGERY Right      OB History   No obstetric history on file.     Family History  Problem Relation Age of Onset   Heart disease Mother    Atrial fibrillation Mother    Cancer - Other Father    Pancreatic cancer Father    Pancreatic cancer Maternal Uncle    Colon cancer Neg Hx    Prostate cancer Neg Hx    Rectal cancer Neg Hx    Stomach cancer Neg Hx     Social History   Tobacco Use   Smoking status: Never   Smokeless tobacco: Never  Substance Use Topics   Alcohol use: Yes    Comment: rarely   Drug use: No    Home Medications Prior to Admission medications   Medication Sig Start Date End Date Taking? Authorizing Provider  acetaminophen (TYLENOL) 500 MG tablet Take 500 mg by mouth every 4 (four) hours as needed.    [provider]  amLODipine (NORVASC) 5 MG tablet TAKE 1 TABLET BY MOUTH DAILY. 09/27/21   Burchette, Alinda Sierras, MD  b complex vitamins capsule Take 1 capsule by mouth daily.    [provider]  Calcium Carb-Cholecalciferol 600-200 MG-UNIT TABS Take 1 tablet by mouth daily.     [provider]  cephALEXin (KEFLEX) 250 MG capsule Take 250 mg by mouth daily. 02/28/20   [provider]  cholecalciferol (VITAMIN D) 1000 UNITS tablet Take 2,000 Units by mouth daily.    [provider]  docusate sodium (COLACE) 100 MG capsule Take 100 mg by mouth daily.    [provider]  gabapentin (NEURONTIN) 300 MG capsule Take 600-900 mg by mouth See admin instructions. Take 300 mg every morning and900 mg at bedtime    [provider]  HYDROcodone-acetaminophen (NORCO/VICODIN) 5-325 MG tablet Take 1 tablet by mouth every 4 (four) hours as needed for moderate pain or severe pain.  03/06/14   [provider]  ipratropium (ATROVENT) 0.06 % nasal spray PLACE 2 SPRAYS IN EACH NOSTRIL 3 TIMES A DAY 02/26/21   Burchette, Alinda Sierras, MD  levothyroxine (SYNTHROID) 137  MCG tablet Take 1 tablet (137 mcg total) by mouth daily before breakfast. 09/27/21   Burchette, Alinda Sierras, MD  loratadine-pseudoephedrine (CLARITIN-D 12-HOUR) 5-120 MG per tablet Take 1 tablet by mouth daily.    [provider]  methocarbamol (ROBAXIN) 750 MG tablet Take 750 mg by mouth 2 (two) times daily as needed for muscle spasms. 06/02/11   [provider]  metoprolol succinate (TOPROL-XL) 50 MG 24 hr tablet TAKE 1 TABLET BY MOUTH 2 TIMES DAILY. 09/27/21   Burchette, Alinda Sierras, MD  MYRBETRIQ 50 MG TB24 tablet Take 50 mg by mouth daily. 03/18/21   [provider]  olmesartan (BENICAR) 40 MG tablet TAKE 1 TABLET BY MOUTH DAILY. 09/27/21   [name]  W, MD  Marnee Guarneri Isoflavones (SOY BALANCE PO) Take 1 tablet by mouth daily. Soy bean extract    [provider]  zolpidem (AMBIEN) 5 MG tablet TAKE 1 TABLET BY MOUTH ONCE DAILY AT BEDTIME 05/23/21   Burchette, Alinda Sierras, MD    Allergies    Nsaids, Oxycodone-acetaminophen, Aspirin, Augmentin [amoxicillin-pot clavulanate], Ciprocin-fluocin-procin [fluocinolone], Ciprofloxacin hcl, Fentanyl, Fluclorolone, Lyrica [pregabalin], Oxycodone, Versed [midazolam], Diclofenac sodium, Hydromorphone hcl, and Voltaren gel [diclofenac sodium]  Review of Systems   Review of Systems  Constitutional:  Negative for chills and fever.  HENT:  Negative for ear pain and sore throat.   Eyes:  Negative for pain and visual disturbance.  Respiratory:  Negative for cough and shortness of breath.   Cardiovascular:  Negative for chest pain and palpitations.  Gastrointestinal:  Positive for abdominal distention and abdominal pain. Negative for vomiting.  Genitourinary:  Negative for dysuria and hematuria.  Musculoskeletal:  Negative for arthralgias and back pain.  Skin:  Negative for color change and rash.  Neurological:  Negative for seizures and syncope.  All other systems reviewed and are negative.  Physical Exam Updated Vital Signs BP  (!) 141/70    Pulse 72    Temp 97.8 F (36.6 C)    Resp 15    Ht 5\' 1"  (1.549 m)    Wt 70.3 kg    SpO2 98%    BMI 29.29 kg/m   Physical Exam Vitals and nursing note reviewed.  Constitutional:      General: She is not in acute distress.    Appearance: She is well-developed.  HENT:     Head: Normocephalic and atraumatic.  Eyes:     Conjunctiva/sclera: Conjunctivae normal.  Cardiovascular:     Rate and Rhythm: Normal rate and regular rhythm.     Heart sounds: No murmur heard. Pulmonary:     Effort: Pulmonary effort is normal. No respiratory distress.     Breath sounds: Normal breath sounds.  Abdominal:     General: There is distension.     Palpations: Abdomen is soft.     Tenderness: There is abdominal tenderness (Generalized).  Musculoskeletal:        General: No swelling.     Cervical back: Neck supple.  Skin:    General: Skin is warm and dry.     Capillary Refill: Capillary refill takes less than 2 seconds.  Neurological:     Mental Status: She is alert.  Psychiatric:        Mood and Affect: Mood normal.    ED Results / Procedures / Treatments   Labs (all labs ordered are listed, but only abnormal results are displayed) Labs Reviewed - No data to display  EKG None  Radiology No results found.  Procedures Fecal disimpaction  Date/Time: 11/16/2021 11:59 PM Performed by: Teressa Lower, MD Authorized by: Teressa Lower, MD  Consent given by: patient Patient understanding: patient states understanding of the procedure being performed Patient consent: the patient's understanding of the procedure matches consent given Local anesthesia used: no  Anesthesia: Local anesthesia used: no  Sedation: Patient sedated: yes Sedation type: anxiolysis Sedatives: midazolam  Patient tolerance: patient tolerated the procedure well with no immediate complications     Medications Ordered in ED Medications - No data to display  ED Course  I have reviewed the triage  vital signs and the nursing notes.  Pertinent labs & imaging results that were available during my care of the patient were reviewed by me and considered  in my medical decision making (see chart for details).    MDM Rules/Calculators/A&P                          Patient seen the emergency department for evaluation of refractory constipation.  Physical exam reveals abdominal distention and mild lower quadrant tenderness. Patient given soapsuds enema without improvement.  Thus, patient given 2 mg midazolam and manual disimpaction performed by this physician leading to removal of large volume hard stool .  Patient then discharged on MiraLAX.   Final Clinical Impression(s) / ED Diagnoses Final diagnoses:  None    Rx / DC Orders ED Discharge Orders     None        Murad Staples, MD 11/17/21 0001

## 2021-11-16 NOTE — ED Notes (Signed)
Offered to order a abd XR, pt declined in triage.

## 2021-11-19 ENCOUNTER — Emergency Department (HOSPITAL_COMMUNITY)
Admission: EM | Admit: 2021-11-19 | Discharge: 2021-11-19 | Disposition: A | Discharge status: Home / Self Care | Payer: Medicare Other | Attending: Emergency Medicine | Admitting: Emergency Medicine

## 2021-11-19 ENCOUNTER — Encounter (HOSPITAL_COMMUNITY): Payer: Self-pay

## 2021-11-19 ENCOUNTER — Other Ambulatory Visit: Payer: Self-pay | Admitting: Family Medicine

## 2021-11-19 DIAGNOSIS — I10 Essential (primary) hypertension: Secondary | ICD-10-CM | POA: Diagnosis not present

## 2021-11-19 DIAGNOSIS — E039 Hypothyroidism, unspecified: Secondary | ICD-10-CM | POA: Diagnosis not present

## 2021-11-19 DIAGNOSIS — Z96611 Presence of right artificial shoulder joint: Secondary | ICD-10-CM | POA: Insufficient documentation

## 2021-11-19 DIAGNOSIS — Z79899 Other long term (current) drug therapy: Secondary | ICD-10-CM | POA: Diagnosis not present

## 2021-11-19 DIAGNOSIS — K625 Hemorrhage of anus and rectum: Secondary | ICD-10-CM | POA: Diagnosis not present

## 2021-11-19 DIAGNOSIS — Z85828 Personal history of other malignant neoplasm of skin: Secondary | ICD-10-CM | POA: Diagnosis not present

## 2021-11-19 DIAGNOSIS — Z96652 Presence of left artificial knee joint: Secondary | ICD-10-CM | POA: Insufficient documentation

## 2021-11-19 LAB — CBC WITH DIFFERENTIAL/PLATELET
Abs Immature Granulocytes: 0.04 10*3/uL (ref 0.00–0.07)
Basophils Absolute: 0.1 10*3/uL (ref 0.0–0.1)
Basophils Relative: 1 %
Eosinophils Absolute: 0.2 10*3/uL (ref 0.0–0.5)
Eosinophils Relative: 3 %
HCT: 33.6 % — ABNORMAL LOW (ref 36.0–46.0)
Hemoglobin: 11.1 g/dL — ABNORMAL LOW (ref 12.0–15.0)
Immature Granulocytes: 1 %
Lymphocytes Relative: 17 %
Lymphs Abs: 1.4 10*3/uL (ref 0.7–4.0)
MCH: 33.9 pg (ref 26.0–34.0)
MCHC: 33 g/dL (ref 30.0–36.0)
MCV: 102.8 fL — ABNORMAL HIGH (ref 80.0–100.0)
Monocytes Absolute: 0.7 10*3/uL (ref 0.1–1.0)
Monocytes Relative: 8 %
Neutro Abs: 5.9 10*3/uL (ref 1.7–7.7)
Neutrophils Relative %: 70 %
Platelets: 353 10*3/uL (ref 150–400)
RBC: 3.27 MIL/uL — ABNORMAL LOW (ref 3.87–5.11)
RDW: 13.1 % (ref 11.5–15.5)
WBC: 8.2 10*3/uL (ref 4.0–10.5)
nRBC: 0 % (ref 0.0–0.2)

## 2021-11-19 LAB — COMPREHENSIVE METABOLIC PANEL
ALT: 15 U/L (ref 0–44)
AST: 28 U/L (ref 15–41)
Albumin: 3.8 g/dL (ref 3.5–5.0)
Alkaline Phosphatase: 44 U/L (ref 38–126)
Anion gap: 10 (ref 5–15)
BUN: 21 mg/dL (ref 8–23)
CO2: 23 mmol/L (ref 22–32)
Calcium: 8.3 mg/dL — ABNORMAL LOW (ref 8.9–10.3)
Chloride: 100 mmol/L (ref 98–111)
Creatinine, Ser: 0.94 mg/dL (ref 0.44–1.00)
GFR, Estimated: >60 mL/min (ref >60)
Glucose, Bld: 122 mg/dL — ABNORMAL HIGH (ref 70–99)
Potassium: 4.8 mmol/L (ref 3.5–5.1)
Sodium: 133 mmol/L — ABNORMAL LOW (ref 135–145)
Total Bilirubin: 0.7 mg/dL (ref 0.3–1.2)
Total Protein: 6.6 g/dL (ref 6.5–8.1)

## 2021-11-19 LAB — TYPE AND SCREEN
ABO/RH(D): O NEG
Antibody Screen: NEGATIVE

## 2021-11-19 NOTE — ED Provider Notes (Signed)
[facility] DEPT Provider Note   CSN: [id number] Arrival date & time: 11/19/21  1219     History Chief Complaint  Patient presents with   Rectal Bleeding    Destiny [name] is a 80 y.o. female.  HPI 80 year old female presents with rectal bleeding.  She developed some bleeding yesterday.  On 12/24 she had to come to the emergency department and was manually disimpacted.  On 12/25 she had stool/diarrhea so much that she was basically incontinent.  That has improved and she had a formed/normal bowel movement yesterday.  However yesterday evening she started noticing some bleeding and it was to the point that she needed to wear a pad because it was not coming with bowel movements.  It is heavier today and she has seen some clots.  She denies any systemic symptoms such as lightheadedness/dizziness.  Has been having some lower abdominal cramping the whole time this has been occurring for several days though she rates this as a 1 or 2 and is mild.  No blood thinner use.  Past Medical History:  Diagnosis Date   Anxiety    Arthritis    Colon polyps    Complication of anesthesia    BP drops with fentyl and versed   Fatty liver    Hepatic lesion    Hepatitis    hep A  1976   History of hepatitis A    1976   Hypertension    Hypothyroidism    Internal hemorrhoids    Liposarcoma of right thigh ([place]) 03/2019    Patient Active Problem List   Diagnosis Date Noted   Abnormal involuntary movement 12/26/2020   Body mass index (BMI) 27.0-27.9, adult 12/26/2020   Chronic anxiety 12/26/2020   Constipation 12/26/2020   Easy bruising 12/26/2020   Edema of lower extremity 12/26/2020   Localized swelling, mass and lump, lower limb 12/26/2020   Encounter for general adult medical examination with abnormal findings 12/26/2020   Family history of breast cancer gene mutation in first degree relative 12/26/2020   Fatty liver 12/26/2020   Hearing loss 12/26/2020    History of thrombophlebitis 12/26/2020   Hyponatremia 12/26/2020   Muscle cramps 12/26/2020   Osteoarthritis 12/26/2020   Osteopenia 12/26/2020   Personal history of other malignant neoplasm of skin 12/26/2020   Postoperative hypothyroidism 12/26/2020   Raynaud's disease 12/26/2020   Lymphedema of right lower extremity 10/05/2020   Essential tremor 10/05/2019   Near syncope 04/28/2019   Adjustment disorder with mixed anxiety and depressed mood 04/28/2019   Liposarcoma of right thigh ([place]) 04/28/2019   Osteoarthritis of right knee 03/10/2019   Hip pain, bilateral 01/07/2018   Sinus tarsi syndrome 08/20/2016   Right knee pain 07/17/2016   S/p reverse total shoulder arthroplasty 12/28/2014   Abnormality of gait 04/25/2014   Foot pain 06/03/2011   Metatarsalgia 06/03/2011   Chronic pain syndrome 12/05/2009   Chronic insomnia 08/15/2009   INGROWN TOENAIL 06/06/2009   LEG PAIN, BILATERAL 06/05/2009   OTHER VITAMIN B12 DEFICIENCY ANEMIA 04/04/2009   Megaloblastic anemia due to vitamin B12 deficiency 04/04/2009   MALAISE AND FATIGUE 02/28/2009   Hypothyroidism 07/26/2008   Hyperlipidemia 11/05/2007   NEUROPATHY, IDIOPATHIC PERIPHERAL NEC 09/16/2007   Essential hypertension 01/28/2007   Arthritis of hand, degenerative 01/28/2007   ROTATOR CUFF INJURY, RIGHT SHOULDER 01/28/2007   OSTEOPENIA 01/28/2007   Disorder of bone and cartilage 01/28/2007    Past Surgical History:  Procedure Laterality Date   ABDOMINAL HYSTERECTOMY  APPENDECTOMY     BACK SURGERY      fusion   CESAREAN SECTION     times 2   COLONOSCOPY     EYE SURGERY Left    cataract removed   FINGER ARTHRODESIS Left 10/02/2017   Procedure: LEFT LONG FINGER ARTHRODESIS;  Surgeon: Milly Jakob, MD;  Location: [facility];  Service: Orthopedics;  Laterality: Left;   FOOT SURGERY Bilateral    FRACTURE SURGERY     broken ankle   MEDIAL PARTIAL KNEE REPLACEMENT Left    done at [facility] Right 12/28/2014   Procedure: REVERSE SHOULDER ARTHROPLASTY;  Surgeon: Nita Sells, MD;  Location: [facility];  Service: Orthopedics;  Laterality: Right;  Right reverse total shoulder arthroplasty   REVERSE TOTAL SHOULDER ARTHROPLASTY Right 12/28/2014   DR CHANDLER   rotator cufff     THYROIDECTOMY, PARTIAL     one lobe removed   VARICOSE VEIN SURGERY Right      OB History   No obstetric history on file.     Family History  Problem Relation Age of Onset   Heart disease Mother    Atrial fibrillation Mother    Cancer - Other Father    Pancreatic cancer Father    Pancreatic cancer Maternal Uncle    Colon cancer Neg Hx    Prostate cancer Neg Hx    Rectal cancer Neg Hx    Stomach cancer Neg Hx     Social History   Tobacco Use   Smoking status: Never   Smokeless tobacco: Never  Substance Use Topics   Alcohol use: Yes    Comment: rarely   Drug use: No    Home Medications Prior to Admission medications   Medication Sig Start Date End Date Taking? Authorizing Provider  acetaminophen (TYLENOL) 500 MG tablet Take 500 mg by mouth every 4 (four) hours as needed.    [provider]  amLODipine (NORVASC) 5 MG tablet TAKE 1 TABLET BY MOUTH DAILY. 09/27/21   Burchette, Alinda Sierras, MD  b complex vitamins capsule Take 1 capsule by mouth daily.    [provider]  Calcium Carb-Cholecalciferol 600-200 MG-UNIT TABS Take 1 tablet by mouth daily.     [provider]  cephALEXin (KEFLEX) 250 MG capsule Take 250 mg by mouth daily. 02/28/20   [provider]  cholecalciferol (VITAMIN D) 1000 UNITS tablet Take 2,000 Units by mouth daily.    [provider]  docusate sodium (COLACE) 100 MG capsule Take 100 mg by mouth daily.    [provider]  gabapentin (NEURONTIN) 300 MG capsule Take 600-900 mg by mouth See admin instructions. Take 300 mg every morning and900 mg at bedtime    [provider]   HYDROcodone-acetaminophen (NORCO/VICODIN) 5-325 MG tablet Take 1 tablet by mouth every 4 (four) hours as needed for moderate pain or severe pain.  03/06/14   [provider]  ipratropium (ATROVENT) 0.06 % nasal spray PLACE 2 SPRAYS IN EACH NOSTRIL 3 TIMES A DAY 02/26/21   Burchette, Alinda Sierras, MD  levothyroxine (SYNTHROID) 137 MCG tablet Take 1 tablet (137 mcg total) by mouth daily before breakfast. 09/27/21   Burchette, Alinda Sierras, MD  loratadine-pseudoephedrine (CLARITIN-D 12-HOUR) 5-120 MG per tablet Take 1 tablet by mouth daily.    [provider]  methocarbamol (ROBAXIN) 750 MG tablet Take 750 mg by mouth 2 (two) times daily as needed for muscle spasms. 06/02/11   [provider]  metoprolol succinate (TOPROL-XL) 50 MG 24 hr tablet TAKE 1 TABLET BY MOUTH 2 TIMES DAILY. 09/27/21   Burchette, Alinda Sierras, MD  MYRBETRIQ 50 MG TB24 tablet Take 50 mg by mouth daily. 03/18/21   [provider]  olmesartan (BENICAR) 40 MG tablet TAKE 1 TABLET BY MOUTH DAILY. 09/27/21   Burchette, Alinda Sierras, MD  polyethylene glycol (MIRALAX) 17 g packet Take 17 g by mouth daily. 11/16/21   Kommor, Madison, MD  Marnee Guarneri Isoflavones (SOY BALANCE PO) Take 1 tablet by mouth daily. Soy bean extract    [provider]  zolpidem (AMBIEN) 5 MG tablet TAKE 1 TABLET BY MOUTH ONCE DAILY AT BEDTIME 11/19/21   Burchette, Alinda Sierras, MD    Allergies    Nsaids, Oxycodone-acetaminophen, Aspirin, Augmentin [amoxicillin-pot clavulanate], Ciprocin-fluocin-procin [fluocinolone], Ciprofloxacin hcl, Fentanyl, Fluclorolone, Lyrica [pregabalin], Oxycodone, Versed [midazolam], Diclofenac sodium, Hydromorphone hcl, and Voltaren gel [diclofenac sodium]  Review of Systems   Review of Systems  Gastrointestinal:  Positive for abdominal pain and blood in stool. Negative for rectal pain and vomiting.  Neurological:  Negative for light-headedness.  All other systems reviewed and are negative.  Physical Exam Updated  Vital Signs BP (!) 116/56 (BP Location: Left Arm)    Pulse 65    Temp 97.8 F (36.6 C) (Oral)    Resp 16    SpO2 98%   Physical Exam Vitals and nursing note reviewed. Exam conducted with a chaperone present.  Constitutional:      Appearance: She is well-developed.  HENT:     Head: Normocephalic and atraumatic.     Right Ear: External ear normal.     Left Ear: External ear normal.     Nose: Nose normal.  Eyes:     General:        Right eye: No discharge.        Left eye: No discharge.  Cardiovascular:     Rate and Rhythm: Normal rate and regular rhythm.     Heart sounds: Normal heart sounds.  Pulmonary:     Effort: Pulmonary effort is normal.     Breath sounds: Normal breath sounds.  Abdominal:     Palpations: Abdomen is soft.     Tenderness: There is abdominal tenderness (mild) in the right lower quadrant and left lower quadrant.  Genitourinary:    Comments: Mild blood noted on finger after digital exam. No obvious externa hemorrhoid. Mild tenderness during exam. No fissure. Skin:    General: Skin is warm and dry.  Neurological:     Mental Status: She is alert.  Psychiatric:        Mood and Affect: Mood is not anxious.    ED Results / Procedures / Treatments   Labs (all labs ordered are listed, but only abnormal results are displayed) Labs Reviewed  CBC WITH DIFFERENTIAL/PLATELET - Abnormal; Notable for the following components:      Result Value   RBC 3.27 (*)    Hemoglobin 11.1 (*)    HCT 33.6 (*)    MCV 102.8 (*)    All other components within normal limits  COMPREHENSIVE METABOLIC PANEL  TYPE AND SCREEN    EKG None  Radiology No results found.  Procedures Procedures   Medications Ordered in ED Medications - No data to display  ED Course  I have reviewed the triage vital signs and the nursing notes.  Pertinent labs & imaging results that were available during my care of the patient were reviewed by me and considered  in my medical decision making  (see chart for details).    MDM Rules/Calculators/A&P                         Patient's bleeding has seemed to slow down even before I evaluated her.  She does have some lower abdominal discomfort though when I have offered to do a CT scan twice she has declined.  She otherwise has mildly low hemoglobin but only slightly lower than chronic baseline.  My suspicion is this is related to the large amount of stool that has come out.  Given she is improving and is not on blood thinners, she would like to go home which I think is reasonable.  We discussed return precautions but she otherwise appears stable for follow-up with her GI specialist as needed.    Final Clinical Impression(s) / ED Diagnoses Final diagnoses:  Rectal bleeding    Rx / DC Orders ED Discharge Orders     None        Sherwood Gambler, MD 11/21/21 1600

## 2021-11-19 NOTE — Discharge Instructions (Addendum)
Return to the emergency department if you develop new or worsening bleeding, new or worsening abdominal pain, shortness of breath, chest pain, lightheadedness, or any other new/concerning symptoms.

## 2021-11-19 NOTE — ED Triage Notes (Signed)
Pt reports that she had an impaction that was removed several days ago. Pt reports that the day after, she was incontinent of stool and urine. Pt reports that today, she has had some frank red bleeding from her rectum.

## 2021-11-19 NOTE — Telephone Encounter (Signed)
Last filled 05/23/2021 Last OV 09/27/2021  Ok to fill?

## 2021-11-19 NOTE — ED Notes (Signed)
Pt sitting up in bed, pt states that she is ready to go home, pt denies pain,

## 2021-12-13 ENCOUNTER — Telehealth: Payer: Self-pay | Admitting: Family Medicine

## 2021-12-13 NOTE — Telephone Encounter (Signed)
Left message for patient to call back and schedule Medicare Annual Wellness Visit (AWV) either virtually or in office. Left  my Destiny [name] number [phone]   Last AWV 12/26/20 ; please schedule at anytime with [facility] Nurse Health Advisor 1 or 2  Awv can be scheduled calendar year uhc   This should be a 45 minute visit.

## 2021-12-20 ENCOUNTER — Ambulatory Visit (INDEPENDENT_AMBULATORY_CARE_PROVIDER_SITE_OTHER): Payer: Medicare Other

## 2021-12-20 VITALS — Ht 61.0 in | Wt 155.0 lb

## 2021-12-20 DIAGNOSIS — Z0000 Encounter for general adult medical examination without abnormal findings: Secondary | ICD-10-CM

## 2021-12-20 NOTE — Patient Instructions (Addendum)
Destiny [name] , Thank you for taking time to come for your Medicare Wellness Visit. I appreciate your ongoing commitment to your health goals. Please review the following plan we discussed and let me know if I can assist you in the future.   These are the goals we discussed:  Goals      Patient Stated     I would like to continue.      transition to [place] goes well        This is a list of the screening recommended for you and due dates:  Health Maintenance  Topic Date Due   COVID-19 Vaccine (4 - Booster) 01/05/2022*   Zoster (Shingles) Vaccine (1 of 2) 03/20/2022*   DEXA scan (bone density measurement)  12/20/2022*   Tetanus Vaccine  06/29/2023   Pneumonia Vaccine  Completed   Flu Shot  Completed   HPV Vaccine  Aged Out  *Topic was postponed. The date shown is not the original due date.  I Opioid Pain Medicine Management Opioids are powerful medicines that are used to treat moderate to severe pain. When used for short periods of time, they can help you to: Sleep better. Do better in physical or occupational therapy. Feel better in the first few days after an injury. Recover from surgery. Opioids should be taken with the supervision of a trained health care provider. They should be taken for the shortest period of time possible. This is because opioids can be addictive, and the longer you take opioids, the greater your risk of addiction. This addiction can also be called opioid use disorder. What are the risks? Using opioid pain medicines for longer than 3 days increases your risk of side effects. Side effects include: Constipation. Nausea and vomiting. Breathing difficulties (respiratory depression). Drowsiness. Confusion. Opioid use disorder. Itching. Taking opioid pain medicine for a long period of time can affect your ability to do daily tasks. It also puts you at risk for: Motor vehicle crashes. Depression. Suicide. Heart attack. Overdose, which can be  life-threatening. What is a pain treatment plan? A pain treatment plan is an agreement between you and your health care provider. Pain is unique to each person, and treatments vary depending on your condition. To manage your pain, you and your health care provider need to work together. To help you do this: Discuss the goals of your treatment, including how much pain you might expect to have and how you will manage the pain. Review the risks and benefits of taking opioid medicines. Remember that a good treatment plan uses more than one approach and minimizes the chance of side effects. Be honest about the amount of medicines you take and about any drug or alcohol use. Get pain medicine prescriptions from only one health care provider. Pain can be managed with many types of alternative treatments. Ask your health care provider to refer you to one or more specialists who can help you manage pain through: Physical or occupational therapy. Counseling (cognitive behavioral therapy). Good nutrition. Biofeedback. Massage. Meditation. Non-opioid medicine. Following a gentle exercise program. How to use opioid pain medicine Taking medicine Take your pain medicine exactly as told by your health care provider. Take it only when you need it. If your pain gets less severe, you may take less than your prescribed dose if your health care provider approves. If you are not having pain, do nottake pain medicine unless your health care provider tells you to take it. If your pain is severe, do  nottry to treat it yourself by taking more pills than instructed on your prescription. Contact your health care provider for help. Write down the times when you take your pain medicine. It is easy to become confused while on pain medicine. Writing the time can help you avoid overdose. Take other over-the-counter or prescription medicines only as told by your health care provider. Keeping yourself and others safe  While  you are taking opioid pain medicine: Do not drive, use machinery, or power tools. Do not sign legal documents. Do not drink alcohol. Do not take sleeping pills. Do not supervise children by yourself. Do not do activities that require climbing or being in high places. Do not go to a lake, river, ocean, spa, or swimming pool. Do not share your pain medicine with anyone. Keep pain medicine in a locked cabinet or in a secure area where pets and children cannot reach it. Stopping your use of opioids If you have been taking opioid medicine for more than a few weeks, you may need to slowly decrease (taper) how much you take until you stop completely. Tapering your use of opioids can decrease your risk of symptoms of withdrawal, such as: Pain and cramping in the abdomen. Nausea. Sweating. Sleepiness. Restlessness. Uncontrollable shaking (tremors). Cravings for the medicine. Do not attempt to taper your use of opioids on your own. Talk with your health care provider about how to do this. Your health care provider may prescribe a step-down schedule based on how much medicine you are taking and how long you have been taking it. Getting rid of leftover pills Do not save any leftover pills. Get rid of leftover pills safely by: Taking the medicine to a prescription take-back program. This is usually offered by the county or law enforcement. Bringing them to a pharmacy that has a drug disposal container. Flushing them down the toilet. Check the label or package insert of your medicine to see whether this is safe to do. Throwing them out in the trash. Check the label or package insert of your medicine to see whether this is safe to do. If it is safe to throw it out, remove the medicine from the original container, put it into a sealable bag or container, and mix it with used coffee grounds, food scraps, dirt, or cat litter before putting it in the trash. Follow these instructions at home: Activity Do  exercises as told by your health care provider. Avoid activities that make your pain worse. Return to your normal activities as told by your health care provider. Ask your health care provider what activities are safe for you. General instructions You may need to take these actions to prevent or treat constipation: Drink enough fluid to keep your urine pale yellow. Take over-the-counter or prescription medicines. Eat foods that are high in fiber, such as beans, whole grains, and fresh fruits and vegetables. Limit foods that are high in fat and processed sugars, such as fried or sweet foods. Keep all follow-up visits. This is important. Where to find support If you have been taking opioids for a long time, you may benefit from receiving support for quitting from a local support group or counselor. Ask your health care provider for a referral to these resources in your area. Where to find more information Centers for Disease Control and Prevention (CDC): http://www.wolf.info/ [place] Food and Drug Administration (FDA): [employer] Get help right away if: You may have taken too much of an opioid (overdosed). Common symptoms of an overdose:  Your breathing is slower or more shallow than normal. You have a very slow heartbeat (pulse). You have slurred speech. You have nausea and vomiting. Your pupils become very small. You have other potential symptoms: You are very confused. You faint or feel like you will faint. You have cold, clammy skin. You have blue lips or fingernails. You have thoughts of harming yourself or harming others. These symptoms may represent a serious problem that is an emergency. Do not wait to see if the symptoms will go away. Get medical help right away. Call your local emergency services (911 in the [place] Do not drive yourself to the hospital.  If you ever feel like you may hurt yourself or others, or have thoughts about taking your own life, get help right away. Go to your nearest  emergency department or: Call your local emergency services (911 in the [place] Call the [facility] 334-254-0715 in the [place] Call a suicide crisis helpline, such as the [place] at (314) 344-9650 or 988 in the [place]. This is open 24 hours a day in the [place] Text the Crisis Text Line at 403-441-3836 (in the [place].). Summary Opioid medicines can help you manage moderate to severe pain for a short period of time. A pain treatment plan is an agreement between you and your health care provider. Discuss the goals of your treatment, including how much pain you might expect to have and how you will manage the pain. If you think that you or someone else may have taken too much of an opioid, get medical help right away. This information is not intended to replace advice given to you by your health care provider. Make sure you discuss any questions you have with your health care provider. Document Revised: 06/05/2021 Document Reviewed: 02/20/2021 Elsevier Patient Education  [place].  Advanced directives: Yes  Conditions/risks identified: None  Next appointment: Follow up in one year for your annual wellness visit    Preventive Care 65 Years and Older, Female Preventive care refers to lifestyle choices and visits with your health care provider that can promote health and wellness. What does preventive care include? A yearly physical exam. This is also called an annual well check. Dental exams once or twice a year. Routine eye exams. Ask your health care provider how often you should have your eyes checked. Personal lifestyle choices, including: Daily care of your teeth and gums. Regular physical activity. Eating a healthy diet. Avoiding tobacco and drug use. Limiting alcohol use. Practicing safe sex. Taking low-dose aspirin every day. Taking vitamin and mineral supplements as recommended by your health care provider. What happens during an  annual well check? The services and screenings done by your health care provider during your annual well check will depend on your age, overall health, lifestyle risk factors, and family history of disease. Counseling  Your health care provider may ask you questions about your: Alcohol use. Tobacco use. Drug use. Emotional well-being. Home and relationship well-being. Sexual activity. Eating habits. History of falls. Memory and ability to understand (cognition). Work and work Statistician. Reproductive health. Screening  You may have the following tests or measurements: Height, weight, and BMI. Blood pressure. Lipid and cholesterol levels. These may be checked every 5 years, or more frequently if you are over 50 years old. Skin check. Lung cancer screening. You may have this screening every year starting at age 78 if you have a 30-pack-year history of smoking and currently smoke or have quit  within the past 15 years. Fecal occult blood test (FOBT) of the stool. You may have this test every year starting at age 45. Flexible sigmoidoscopy or colonoscopy. You may have a sigmoidoscopy every 5 years or a colonoscopy every 10 years starting at age 34. Hepatitis C blood test. Hepatitis B blood test. Sexually transmitted disease (STD) testing. Diabetes screening. This is done by checking your blood sugar (glucose) after you have not eaten for a while (fasting). You may have this done every 1-3 years. Bone density scan. This is done to screen for osteoporosis. You may have this done starting at age 7. Mammogram. This may be done every 1-2 years. Talk to your health care provider about how often you should have regular mammograms. Talk with your health care provider about your test results, treatment options, and if necessary, the need for more tests. Vaccines  Your health care provider may recommend certain vaccines, such as: Influenza vaccine. This is recommended every year. Tetanus,  diphtheria, and acellular pertussis (Tdap, Td) vaccine. You may need a Td booster every 10 years. Zoster vaccine. You may need this after age 76. Pneumococcal 13-valent conjugate (PCV13) vaccine. One dose is recommended after age 3. Pneumococcal polysaccharide (PPSV23) vaccine. One dose is recommended after age 24. Talk to your health care provider about which screenings and vaccines you need and how often you need them. This information is not intended to replace advice given to you by your health care provider. Make sure you discuss any questions you have with your health care provider. Document Released: 12/07/2015 Document Revised: 07/30/2016 Document Reviewed: 09/11/2015 Elsevier Interactive Patient Education  2017 [place] Prevention in the Home Falls can cause injuries. They can happen to people of all ages. There are many things you can do to make your home safe and to help prevent falls. What can I do on the outside of my home? Regularly fix the edges of walkways and driveways and fix any cracks. Remove anything that might make you trip as you walk through a door, such as a raised step or threshold. Trim any bushes or trees on the path to your home. Use bright outdoor lighting. Clear any walking paths of anything that might make someone trip, such as rocks or tools. Regularly check to see if handrails are loose or broken. Make sure that both sides of any steps have handrails. Any raised decks and porches should have guardrails on the edges. Have any leaves, snow, or ice cleared regularly. Use sand or salt on walking paths during winter. Clean up any spills in your garage right away. This includes oil or grease spills. What can I do in the bathroom? Use night lights. Install grab bars by the toilet and in the tub and shower. Do not use towel bars as grab bars. Use non-skid mats or decals in the tub or shower. If you need to sit down in the shower, use a plastic,  non-slip stool. Keep the floor dry. Clean up any water that spills on the floor as soon as it happens. Remove soap buildup in the tub or shower regularly. Attach bath mats securely with double-sided non-slip rug tape. Do not have throw rugs and other things on the floor that can make you trip. What can I do in the bedroom? Use night lights. Make sure that you have a light by your bed that is easy to reach. Do not use any sheets or blankets that are too big for your bed. They  should not hang down onto the floor. Have a firm chair that has side arms. You can use this for support while you get dressed. Do not have throw rugs and other things on the floor that can make you trip. What can I do in the kitchen? Clean up any spills right away. Avoid walking on wet floors. Keep items that you use a lot in easy-to-reach places. If you need to reach something above you, use a strong step stool that has a grab bar. Keep electrical cords out of the way. Do not use floor polish or wax that makes floors slippery. If you must use wax, use non-skid floor wax. Do not have throw rugs and other things on the floor that can make you trip. What can I do with my stairs? Do not leave any items on the stairs. Make sure that there are handrails on both sides of the stairs and use them. Fix handrails that are broken or loose. Make sure that handrails are as long as the stairways. Check any carpeting to make sure that it is firmly attached to the stairs. Fix any carpet that is loose or worn. Avoid having throw rugs at the top or bottom of the stairs. If you do have throw rugs, attach them to the floor with carpet tape. Make sure that you have a light switch at the top of the stairs and the bottom of the stairs. If you do not have them, ask someone to add them for you. What else can I do to help prevent falls? Wear shoes that: Do not have high heels. Have rubber bottoms. Are comfortable and fit you well. Are closed  at the toe. Do not wear sandals. If you use a stepladder: Make sure that it is fully opened. Do not climb a closed stepladder. Make sure that both sides of the stepladder are locked into place. Ask someone to hold it for you, if possible. Clearly mark and make sure that you can see: Any grab bars or handrails. First and last steps. Where the edge of each step is. Use tools that help you move around (mobility aids) if they are needed. These include: Canes. Walkers. Scooters. Crutches. Turn on the lights when you go into a dark area. Replace any light bulbs as soon as they burn out. Set up your furniture so you have a clear path. Avoid moving your furniture around. If any of your floors are uneven, fix them. If there are any pets around you, be aware of where they are. Review your medicines with your doctor. Some medicines can make you feel dizzy. This can increase your chance of falling. Ask your doctor what other things that you can do to help prevent falls. This information is not intended to replace advice given to you by your health care provider. Make sure you discuss any questions you have with your health care provider. Document Released: 09/06/2009 Document Revised: 04/17/2016 Document Reviewed: 12/15/2014 Elsevier Interactive Patient Education  2017 [employer].

## 2021-12-20 NOTE — Progress Notes (Signed)
Subjective:   Destiny [name] is a 81 y.o. female who presents for Medicare Annual (Subsequent) preventive examination.  Review of Systems    No ROS Cardiac Risk Factors include: advanced age (>25men, >12 women);hypertension    Objective:    Today's Vitals   12/20/21 1259  Weight: 155 lb (70.3 kg)  Height: 5\' 1"  (1.549 m)   Body mass index is 29.29 kg/m.  Advanced Directives 12/20/2021 11/19/2021 11/16/2021 01/15/2021 07/26/2020 02/16/2020 12/26/2019  Does Patient Have a Medical Advance Directive? Yes No No Yes Yes Yes Yes  Type of Paramedic of [place];Living will - - [place];Living will [place];Living will Living will [place];Living will  Does patient want to make changes to medical advance directive? No - Patient declined - - - No - Guardian declined - -  Copy of [place] in Chart? No - copy requested - - No - copy requested - - No - copy requested  Would patient like information on creating a medical advance directive? - No - Patient declined - - - - -    Current Medications (verified) Outpatient Encounter Medications as of 12/20/2021  Medication Sig   acetaminophen (TYLENOL) 500 MG tablet Take 500 mg by mouth every 4 (four) hours as needed.   amLODipine (NORVASC) 5 MG tablet TAKE 1 TABLET BY MOUTH DAILY.   b complex vitamins capsule Take 1 capsule by mouth daily.   Calcium Carb-Cholecalciferol 600-200 MG-UNIT TABS Take 1 tablet by mouth daily.    cephALEXin (KEFLEX) 250 MG capsule Take 250 mg by mouth daily.   cholecalciferol (VITAMIN D) 1000 UNITS tablet Take 2,000 Units by mouth daily.   docusate sodium (COLACE) 100 MG capsule Take 100 mg by mouth daily.   gabapentin (NEURONTIN) 300 MG capsule Take 600-900 mg by mouth See admin instructions. Take 300 mg every morning and900 mg at bedtime   HYDROcodone-acetaminophen (NORCO/VICODIN) 5-325 MG tablet Take 1 tablet by  mouth every 4 (four) hours as needed for moderate pain or severe pain.    ipratropium (ATROVENT) 0.06 % nasal spray PLACE 2 SPRAYS IN EACH NOSTRIL 3 TIMES A DAY   levothyroxine (SYNTHROID) 137 MCG tablet Take 1 tablet (137 mcg total) by mouth daily before breakfast.   loratadine-pseudoephedrine (CLARITIN-D 12-HOUR) 5-120 MG per tablet Take 1 tablet by mouth daily.   methocarbamol (ROBAXIN) 750 MG tablet Take 750 mg by mouth 2 (two) times daily as needed for muscle spasms.   metoprolol succinate (TOPROL-XL) 50 MG 24 hr tablet TAKE 1 TABLET BY MOUTH 2 TIMES DAILY.   MYRBETRIQ 50 MG TB24 tablet Take 50 mg by mouth daily.   olmesartan (BENICAR) 40 MG tablet TAKE 1 TABLET BY MOUTH DAILY.   polyethylene glycol (MIRALAX) 17 g packet Take 17 g by mouth daily.   Soybean-Soy Isoflavones (SOY BALANCE PO) Take 1 tablet by mouth daily. Soy bean extract   zolpidem (AMBIEN) 5 MG tablet TAKE 1 TABLET BY MOUTH ONCE DAILY AT BEDTIME   No facility-administered encounter medications on file as of 12/20/2021.    Allergies (verified) Nsaids, Oxycodone-acetaminophen, Aspirin, Augmentin [amoxicillin-pot clavulanate], Ciprocin-fluocin-procin [fluocinolone], Ciprofloxacin hcl, Fentanyl, Fluclorolone, Lyrica [pregabalin], Oxycodone, Versed [midazolam], Diclofenac sodium, Hydromorphone hcl, and Voltaren gel [diclofenac sodium]   History: Past Medical History:  Diagnosis Date   Anxiety    Arthritis    Colon polyps    Complication of anesthesia    BP drops with fentyl and versed  Fatty liver    Hepatic lesion    Hepatitis    hep A  1976   History of hepatitis A    1976   Hypertension    Hypothyroidism    Internal hemorrhoids    Liposarcoma of right thigh ([place]) 03/2019   Past Surgical History:  Procedure Laterality Date   ABDOMINAL HYSTERECTOMY     APPENDECTOMY     BACK SURGERY      fusion   CESAREAN SECTION     times 2   COLONOSCOPY     EYE SURGERY Left    cataract removed   FINGER ARTHRODESIS Left  10/02/2017   Procedure: LEFT LONG FINGER ARTHRODESIS;  Surgeon: Milly Jakob, MD;  Location: [facility];  Service: Orthopedics;  Laterality: Left;   FOOT SURGERY Bilateral    FRACTURE SURGERY     broken ankle   MEDIAL PARTIAL KNEE REPLACEMENT Left    done at [facility] Right 12/28/2014   Procedure: REVERSE SHOULDER ARTHROPLASTY;  Surgeon: Nita Sells, MD;  Location: [facility];  Service: Orthopedics;  Laterality: Right;  Right reverse total shoulder arthroplasty   REVERSE TOTAL SHOULDER ARTHROPLASTY Right 12/28/2014   DR CHANDLER   rotator cufff     THYROIDECTOMY, PARTIAL     one lobe removed   VARICOSE VEIN SURGERY Right    Family History  Problem Relation Age of Onset   Heart disease Mother    Atrial fibrillation Mother    Cancer - Other Father    Pancreatic cancer Father    Pancreatic cancer Maternal Uncle    Colon cancer Neg Hx    Prostate cancer Neg Hx    Rectal cancer Neg Hx    Stomach cancer Neg Hx    Social History   Socioeconomic History   Marital status: Married    Spouse name: Not on file   Number of children: Not on file   Years of education: Not on file   Highest education level: Not on file  Occupational History   Occupation: retired    Comment: Therapist, sports  Tobacco Use   Smoking status: Never   Smokeless tobacco: Never  Substance and Sexual Activity   Alcohol use: Yes    Comment: rarely   Drug use: No   Sexual activity: Not Currently  Other Topics Concern   Not on file  Social History Narrative   Married   [place] 2   Lives in 1 story ranch in [place] and also has mountain home in [place] with stairs   Social Determinants of Health   Financial Resource Strain: Low Risk    Difficulty of Paying Living Expenses: Not hard at all  Food Insecurity: No Food Insecurity   Worried About Charity fundraiser in the Last Year: Never true   Arboriculturist in the Last Year: Never true  Transportation Needs: No  Transportation Needs   Lack of Transportation (Medical): No   Lack of Transportation (Non-Medical): No  Physical Activity: Insufficiently Active   Days of Exercise per Week: 3 days   Minutes of Exercise per Session: 30 min  Stress: No Stress Concern Present   Feeling of Stress : Not at all  Social Connections: Moderately Isolated   Frequency of Communication with Friends and Family: More than three times a week   Frequency of Social Gatherings with Friends and Family: More than three times a week   Attends Religious Services: Never  Active Member of Clubs or Organizations: No   Attends Archivist Meetings: Never   Marital Status: Married    Clinical Intake:  Diabetic? No  Interpreter Needed?: NoActivities of Daily Living In your present state of health, do you have any difficulty performing the following activities: 12/20/2021 01/15/2021  Hearing? N Y  Comment - has bilateral hearing aids  Vision? N N  Difficulty concentrating or making decisions? N N  Walking or climbing stairs? N Y  Dressing or bathing? N N  Doing errands, shopping? N N  Preparing Food and eating ? N N  Using the Toilet? N N  In the past six months, have you accidently leaked urine? N Y  Do you have problems with loss of bowel control? N N  Managing your Medications? N N  Managing your Finances? N N  Housekeeping or managing your Housekeeping? N N  Some recent data might be hidden    Patient Care Team: Eulas Post, MD as PCP - General (Family Medicine) Viona Gilmore, [facility] as Pharmacist (Pharmacist)  Indicate any recent Medical Services you may have received from other than [facility] providers in the past year (date may be approximate).     Assessment:   This is a routine wellness examination for Carline. Virtual Visit via Telephone Note  I connected with  Charm Barges Castor on 12/20/21 at  1:00 PM EST by telephone and verified that I am speaking with the correct person using two  identifiers.  Location: Patient: Home Provider: Office Persons participating in the virtual visit: patient/Nurse Health Advisor   I discussed the limitations, risks, security and privacy concerns of performing an evaluation and management service by telephone and the availability of in person appointments. The patient expressed understanding and agreed to proceed.  Interactive audio and video telecommunications were attempted between this nurse and patient, however failed, due to patient having technical difficulties OR patient did not have access to video capability.  We continued and completed visit with audio only.  Some vital signs may be absent or patient reported.   Criselda Peaches, LPN   Hearing/Vision screen Hearing Screening - Comments:: Wears hearing aids. Vision Screening - Comments:: Wears glasses. Followed by Dr Wynetta Emery  Dietary issues and exercise activities discussed: Current Exercise Habits: Home exercise routine, Type of exercise: walking, Time (Minutes): 30, Frequency (Times/Week): 3, Weekly Exercise (Minutes/Week): 90, Intensity: Mild   Goals Addressed             This Visit's Progress    Patient Stated       I would like to continue.        Depression Screen PHQ 2/9 Scores 12/20/2021 01/15/2021 01/09/2020 10/05/2019 01/13/2017 10/08/2016 08/20/2016  PHQ - 2 Score 0 0 1 0 0 0 0  PHQ- 9 Score - - - 2 - - -    Fall Risk Fall Risk  12/20/2021 01/15/2021 01/09/2020 01/13/2017 10/08/2016  Falls in the past year? 1 0 0 No No  Number falls in past yr: 0 0 - - -  Injury with Fall? 1 0 - - -  Comment Fall injury skin tear to left arm.recieved.medical attention - - - -  Risk for fall due to : - No Fall Risks Medication side effect;Orthopedic patient;Other (Comment) - -  Risk for fall due to: Comment - - recent cancer diagnosis in 2020 - -  Follow up - Falls evaluation completed;Falls prevention discussed Falls evaluation completed;Education provided;Falls prevention  discussed - -  FALL RISK PREVENTION PERTAINING TO THE HOME:  Any stairs in or around the home? No  If so, are there any without handrails? No  Home free of loose throw rugs in walkways, pet beds, electrical cords, etc? Yes  Adequate lighting in your home to reduce risk of falls? Yes   ASSISTIVE DEVICES UTILIZED TO PREVENT FALLS:  Life alert? No  Use of a cane, walker or w/c? No  Grab bars in the bathroom? Yes Shower chair or bench in shower? No  Elevated toilet seat or a handicapped toilet? No   TIMED UP AND GO:  Was the test performed? No .   Cognitive Function:     6CIT Screen 12/20/2021 01/09/2020  What Year? 0 points 0 points  What month? 0 points 0 points  What time? 0 points 0 points  Count back from 20 0 points 0 points  Months in reverse 0 points 0 points  Repeat phrase 0 points 0 points  Total Score 0 0    Immunizations Immunization History  Administered Date(s) Administered   Fluad Quad(high Dose 65+) 07/29/2019   Influenza Whole 09/08/2008   Influenza, High Dose Seasonal PF 09/25/2014, 08/09/2015, 08/12/2016   Influenza, Quadrivalent, Recombinant, Inj, Pf 08/19/2017, 08/26/2018   Influenza-Unspecified 08/22/2020, 09/08/2021   PFIZER SARS-COV-2 Pediatric Vaccination 5-59yrs 09/24/2020   PFIZER(Purple Top)SARS-COV-2 Vaccination 04/29/2021   Pneumococcal Conjugate-13 06/28/2013   Pneumococcal Polysaccharide-23 11/25/2004   Td 11/25/2004   Tdap 06/28/2013   Zoster, Live 07/15/2011   Covid-19 vaccine status: Completed vaccines 09/24/20  Qualifies for Shingles Vaccine? Yes   Zostavax completed No   Shingrix Completed?: No.    Education has been provided regarding the importance of this vaccine. Patient has been advised to call insurance company to determine out of pocket expense if they have not yet received this vaccine. Advised may also receive vaccine at local pharmacy or Health Dept. Verbalized acceptance and understanding.  Screening Tests Health  Maintenance  Topic Date Due   COVID-19 Vaccine (4 - Booster) 01/05/2022 (Originally 06/24/2021)   Zoster Vaccines- Shingrix (1 of 2) 03/20/2022 (Originally 01/27/1960)   DEXA SCAN  12/20/2022 (Originally 01/28/2004)   TETANUS/TDAP  06/29/2023   Pneumonia Vaccine 21+ Years old  Completed   INFLUENZA VACCINE  Completed   HPV VACCINES  Aged Out    Health Maintenance  There are no preventive care reminders to display for this patient.  Dexa Scan: Patient deferred  Additional Screening:   Vision Screening: Recommended annual ophthalmology exams for early detection of glaucoma and other disorders of the eye. Is the patient up to date with their annual eye exam?  Yes  Who is the provider or what is the name of the office in which the patient attends annual eye exams? Followed by Dr Wynetta Emery  Dental Screening: Recommended annual dental exams for proper oral hygiene  Community Resource Referral /   Chronic Care Management:  CRR required this visit?  No   CCM required this visit?  No      Plan:     I have personally reviewed and noted the following in the patients chart:   Medical and social history Use of alcohol, tobacco or illicit drugs  Current medications and supplements including opioid prescriptions. Patient currently taking opioids Functional ability and status Nutritional status Physical activity Advanced directives List of other physicians Hospitalizations, surgeries, and ER visits in previous 12 months Vitals Screenings to include cognitive, depression, and falls Referrals and appointments  In addition, I have reviewed and  discussed with patient certain preventive protocols, quality metrics, and best practice recommendations. A written personalized care plan for preventive services as well as general preventive health recommendations were provided to patient.     Criselda Peaches, LPN   9/72/[id number]

## 2022-02-07 DIAGNOSIS — M25511 Pain in right shoulder: Secondary | ICD-10-CM | POA: Diagnosis not present

## 2022-02-24 ENCOUNTER — Other Ambulatory Visit: Payer: Self-pay | Admitting: Family Medicine

## 2022-03-15 DIAGNOSIS — C4921 Malignant neoplasm of connective and soft tissue of right lower limb, including hip: Secondary | ICD-10-CM | POA: Diagnosis not present

## 2022-03-18 ENCOUNTER — Other Ambulatory Visit: Payer: Self-pay | Admitting: Family Medicine

## 2022-03-18 ENCOUNTER — Telehealth: Payer: Self-pay | Admitting: Radiology

## 2022-03-18 DIAGNOSIS — M21251 Flexion deformity, right hip: Secondary | ICD-10-CM | POA: Diagnosis not present

## 2022-03-18 DIAGNOSIS — Z08 Encounter for follow-up examination after completed treatment for malignant neoplasm: Secondary | ICD-10-CM | POA: Diagnosis not present

## 2022-03-18 DIAGNOSIS — Z85831 Personal history of malignant neoplasm of soft tissue: Secondary | ICD-10-CM | POA: Diagnosis not present

## 2022-03-18 DIAGNOSIS — C4921 Malignant neoplasm of connective and soft tissue of right lower limb, including hip: Secondary | ICD-10-CM | POA: Diagnosis not present

## 2022-03-18 DIAGNOSIS — I890 Lymphedema, not elsewhere classified: Secondary | ICD-10-CM | POA: Diagnosis not present

## 2022-03-18 DIAGNOSIS — Z9889 Other specified postprocedural states: Secondary | ICD-10-CM | POA: Diagnosis not present

## 2022-03-18 NOTE — Telephone Encounter (Signed)
Patient would like to reestablish care with Dr Sondra Come. ?

## 2022-03-28 ENCOUNTER — Ambulatory Visit (INDEPENDENT_AMBULATORY_CARE_PROVIDER_SITE_OTHER): Payer: Medicare Other | Admitting: Family Medicine

## 2022-03-28 ENCOUNTER — Other Ambulatory Visit: Payer: Self-pay | Admitting: Orthopedic Surgery

## 2022-03-28 ENCOUNTER — Encounter: Payer: Self-pay | Admitting: Family Medicine

## 2022-03-28 VITALS — BP 136/64 | HR 70 | Temp 98.0°F | Ht 61.0 in | Wt 158.8 lb

## 2022-03-28 DIAGNOSIS — G894 Chronic pain syndrome: Secondary | ICD-10-CM

## 2022-03-28 DIAGNOSIS — C4921 Malignant neoplasm of connective and soft tissue of right lower limb, including hip: Secondary | ICD-10-CM | POA: Diagnosis not present

## 2022-03-28 DIAGNOSIS — I10 Essential (primary) hypertension: Secondary | ICD-10-CM

## 2022-03-28 DIAGNOSIS — M25511 Pain in right shoulder: Secondary | ICD-10-CM

## 2022-03-28 DIAGNOSIS — E890 Postprocedural hypothyroidism: Secondary | ICD-10-CM

## 2022-03-28 DIAGNOSIS — Z96611 Presence of right artificial shoulder joint: Secondary | ICD-10-CM | POA: Diagnosis not present

## 2022-03-28 NOTE — Progress Notes (Signed)
? ?Established Patient Office Visit ? ?Subjective   ?Patient ID: Destiny [name], female    DOB: 24-Mar-1941  Age: 81 y.o. MRN: [id number] ? ?Chief Complaint  ?Patient presents with  ? Follow-up  ? ? ?HPI ? ? ?Patient seen for medical follow-up.  She is followed by [facility].  She has history of liposarcoma of the right thigh few years ago.  Is currently getting every 34-monthfollow-up and had recent MRI right thigh with no signs of recurrence.  Because of her age and age of her husband she is considering other options more locally.  She has some chronic lymphedema right lower extremity and uses compression for that.  She and her husband plan to move to a friend's home in a couple of weeks. ? ?She had some recent recurrent right shoulder pains.  Has appointment with [facility] later today.  Had previous right rotator cuff surgery in the past ? ?Recently having some progressive low back pain.  She has prior history of L5-S1 fusion about 14 years ago.  Denies any fall or injury.  She has daily pain with movement.  She is followed by pain management and maintained on Vicodin and gabapentin.  She did have fecal impaction December 24 and had to go to the ER for that.  No issues since then. ? ?She has hypertension treated with multiple medications.  History of postsurgical hypothyroidism maintained on levothyroxine.  She had labs last visit which were stable. ? ?Past Medical History:  ?Diagnosis Date  ? Anxiety   ? Arthritis   ? Colon polyps   ? Complication of anesthesia   ? BP drops with fentyl and versed  ? Fatty liver   ? Hepatic lesion   ? Hepatitis   ? hep A  1976  ? History of hepatitis A   ? 1976  ? Hypertension   ? Hypothyroidism   ? Internal hemorrhoids   ? [place] [place]  ? ?Past Surgical History:  ?Procedure Laterality Date  ? ABDOMINAL HYSTERECTOMY    ? APPENDECTOMY    ? BACK SURGERY    ?  fusion  ? CESAREAN SECTION    ? times 2  ?  COLONOSCOPY    ? EYE SURGERY Left   ? cataract removed  ? FINGER ARTHRODESIS Left 10/02/2017  ? Procedure: LEFT LONG FINGER ARTHRODESIS;  Surgeon: TMilly Jakob MD;  [facility]  Service: Orthopedics;  Laterality: Left;  ? FOOT SURGERY Bilateral   ? FRACTURE SURGERY    ? broken ankle  ? MEDIAL PARTIAL KNEE REPLACEMENT Left   ? [facility]  ? Procedure: REVERSE SHOULDER ARTHROPLASTY;  Surgeon: JNita Sells MD;  [facility]  Service: Orthopedics;  Laterality: Right;  Right reverse total shoulder arthroplasty  ? REVERSE TOTAL SHOULDER ARTHROPLASTY Right 12/28/2014  ? DR CTamera Punt ? rotator cufff    ? THYROIDECTOMY, PARTIAL    ? one lobe removed  ? VARICOSE VEIN SURGERY Right   ? ? reports that she has never smoked. She has never used smokeless tobacco. She reports current alcohol use. She reports that she does not use drugs. ?family history includes Atrial fibrillation in her mother; Cancer - Other in her father; Heart disease in her mother; Pancreatic cancer in her father and maternal uncle. ?Allergies  ?Allergen Reactions  ? Nsaids Other (See Comments)  ?   BRUISES EASILY ?Other  Reaction: BRUISES EASILY ?Other Reaction: BRUISES EASILY ?  ? Oxycodone-Acetaminophen Other (See Comments)  ?   VERY CONSTIPATING, does not provide pain relief  ? Aspirin Other (See Comments)  ?  Bruising ?  ? Augmentin [Amoxicillin-Pot Clavulanate] Other (See Comments)  ?  headache  ? Ciprocin-Fluocin-Procin [Fluocinolone]   ? Ciprofloxacin Hcl   ?  tendonitis  ? Fentanyl Other (See Comments)  ?  Does not work  ? Fluclorolone Other (See Comments)  ? Lyrica [Pregabalin] Other (See Comments)  ?  dysphoria  ? Oxycodone Other (See Comments)  ?  Does not work  ? Versed [Midazolam] Other (See Comments)  ?  Does not work  ? Diclofenac Sodium Other (See Comments) and Rash  ?  Skin peeling. ?Voltaren gel-rash and skin peeling ?  ? Hydromorphone Hcl Itching   ?   Itching with large doses - able to tolerate lower doses or shorter duration  ? Voltaren Gel [Diclofenac Sodium] Rash and Other (See Comments)  ?  Skin peeling.  ? ? ?Review of Systems  ?Constitutional:  Negative for chills, fever, malaise/fatigue and weight loss.  ?Respiratory:  Negative for shortness of breath.   ?Cardiovascular:  Negative for chest pain.  ?Genitourinary:  Negative for dysuria.  ?Musculoskeletal:  Positive for joint pain.  ? ?  ?Objective:  ?  ? ?BP 136/64 (BP Location: Left Arm, Patient Position: Sitting, Cuff Size: Normal)   Pulse 70   Temp 98 ?F (36.7 ?C) (Oral)   Ht '5\' 1"'$  (1.549 m)   Wt 158 lb 12.8 oz (72 kg)   SpO2 97%   BMI 30.00 kg/m?  ? ? ?Physical Exam ?Vitals reviewed.  ?Cardiovascular:  ?   Rate and Rhythm: Normal rate and regular rhythm.  ?Pulmonary:  ?   Effort: Pulmonary effort is normal.  ?   Breath sounds: Normal breath sounds. No wheezing or rales.  ?Musculoskeletal:  ?   Comments: She has compression around the right lower extremity for her lymphedema.  No significant left lower extremity edema.  ?Neurological:  ?   Mental Status: She is alert.  ? ? ? ?No results found for any visits on 03/28/22. ? ? ? ?The ASCVD Risk score (Arnett DK, et al., 2019) failed to calculate for the following reasons: ?  The 2019 ASCVD risk score is only valid for ages 60 to 63 ? ?  ?Assessment & Plan:  ? ?#1 hypertension stable and at goal.  Continue amlodipine 5 mg daily, Benicar 40 mg daily, and Toprol-XL 50 mg twice daily. ? ?#2 hypothyroidism.  TSH at goal 6 months ago.  Recheck TSH at 55-monthfollow-up ? ?#3 history of liposarcoma right thigh.  Recent MRI showed no recurrence.  She is considering trying to get more local follow-up because of difficulty traveling around [place]? ?#4 chronic pain related to her chronic back pain.  Followed by pain management and currently maintained on gabapentin and Vicodin.  Recommend regular use of MiraLAX and stool softener if necessary to  maintain regularity along with plenty fluids and fiber ? ?Return in about 6 months (around 09/28/2022).  ? ? ?BCarolann Littler MD ? ?

## 2022-04-02 ENCOUNTER — Ambulatory Visit
Admission: RE | Admit: 2022-04-02 | Discharge: 2022-04-02 | Disposition: A | Discharge status: Home / Self Care | Payer: Medicare Other | Source: Ambulatory Visit | Attending: Orthopedic Surgery | Admitting: Orthopedic Surgery

## 2022-04-02 DIAGNOSIS — M25511 Pain in right shoulder: Secondary | ICD-10-CM

## 2022-04-02 DIAGNOSIS — Z96611 Presence of right artificial shoulder joint: Secondary | ICD-10-CM | POA: Diagnosis not present

## 2022-04-02 DIAGNOSIS — Z471 Aftercare following joint replacement surgery: Secondary | ICD-10-CM | POA: Diagnosis not present

## 2022-04-07 DIAGNOSIS — G894 Chronic pain syndrome: Secondary | ICD-10-CM | POA: Diagnosis not present

## 2022-04-07 DIAGNOSIS — M19041 Primary osteoarthritis, right hand: Secondary | ICD-10-CM | POA: Diagnosis not present

## 2022-04-07 DIAGNOSIS — M79604 Pain in right leg: Secondary | ICD-10-CM | POA: Diagnosis not present

## 2022-04-07 DIAGNOSIS — Z79891 Long term (current) use of opiate analgesic: Secondary | ICD-10-CM | POA: Diagnosis not present

## 2022-04-09 DIAGNOSIS — Z96611 Presence of right artificial shoulder joint: Secondary | ICD-10-CM | POA: Diagnosis not present

## 2022-04-09 DIAGNOSIS — Z471 Aftercare following joint replacement surgery: Secondary | ICD-10-CM | POA: Diagnosis not present

## 2022-04-14 ENCOUNTER — Encounter: Payer: Self-pay | Admitting: Radiation Oncology

## 2022-04-15 ENCOUNTER — Encounter (HOSPITAL_COMMUNITY): Payer: Self-pay

## 2022-04-15 ENCOUNTER — Emergency Department (HOSPITAL_COMMUNITY): Payer: Medicare Other

## 2022-04-15 ENCOUNTER — Other Ambulatory Visit: Payer: Self-pay

## 2022-04-15 ENCOUNTER — Emergency Department (HOSPITAL_COMMUNITY)
Admission: EM | Admit: 2022-04-15 | Discharge: 2022-04-15 | Disposition: A | Discharge status: Home / Self Care | Payer: Medicare Other | Attending: Emergency Medicine | Admitting: Emergency Medicine

## 2022-04-15 DIAGNOSIS — M25511 Pain in right shoulder: Secondary | ICD-10-CM | POA: Insufficient documentation

## 2022-04-15 DIAGNOSIS — I10 Essential (primary) hypertension: Secondary | ICD-10-CM | POA: Diagnosis not present

## 2022-04-15 DIAGNOSIS — R6889 Other general symptoms and signs: Secondary | ICD-10-CM | POA: Diagnosis not present

## 2022-04-15 DIAGNOSIS — M25519 Pain in unspecified shoulder: Secondary | ICD-10-CM | POA: Diagnosis not present

## 2022-04-15 DIAGNOSIS — R1011 Right upper quadrant pain: Secondary | ICD-10-CM | POA: Diagnosis not present

## 2022-04-15 DIAGNOSIS — R109 Unspecified abdominal pain: Secondary | ICD-10-CM | POA: Diagnosis not present

## 2022-04-15 DIAGNOSIS — I700 Atherosclerosis of aorta: Secondary | ICD-10-CM | POA: Diagnosis not present

## 2022-04-15 DIAGNOSIS — Z743 Need for continuous supervision: Secondary | ICD-10-CM | POA: Diagnosis not present

## 2022-04-15 DIAGNOSIS — E039 Hypothyroidism, unspecified: Secondary | ICD-10-CM | POA: Insufficient documentation

## 2022-04-15 DIAGNOSIS — K760 Fatty (change of) liver, not elsewhere classified: Secondary | ICD-10-CM | POA: Diagnosis not present

## 2022-04-15 DIAGNOSIS — Z79899 Other long term (current) drug therapy: Secondary | ICD-10-CM | POA: Insufficient documentation

## 2022-04-15 DIAGNOSIS — N281 Cyst of kidney, acquired: Secondary | ICD-10-CM | POA: Diagnosis not present

## 2022-04-15 LAB — CBC WITH DIFFERENTIAL/PLATELET
Abs Immature Granulocytes: 0.06 10*3/uL (ref 0.00–0.07)
Basophils Absolute: 0.1 10*3/uL (ref 0.0–0.1)
Basophils Relative: 1 %
Eosinophils Absolute: 0.2 10*3/uL (ref 0.0–0.5)
Eosinophils Relative: 1 %
HCT: 35.6 % — ABNORMAL LOW (ref 36.0–46.0)
Hemoglobin: 12.1 g/dL (ref 12.0–15.0)
Immature Granulocytes: 1 %
Lymphocytes Relative: 10 %
Lymphs Abs: 1.4 10*3/uL (ref 0.7–4.0)
MCH: 34.1 pg — ABNORMAL HIGH (ref 26.0–34.0)
MCHC: 34 g/dL (ref 30.0–36.0)
MCV: 100.3 fL — ABNORMAL HIGH (ref 80.0–100.0)
Monocytes Absolute: 0.9 10*3/uL (ref 0.1–1.0)
Monocytes Relative: 7 %
Neutro Abs: 10.6 10*3/uL — ABNORMAL HIGH (ref 1.7–7.7)
Neutrophils Relative %: 80 %
Platelets: 344 10*3/uL (ref 150–400)
RBC: 3.55 MIL/uL — ABNORMAL LOW (ref 3.87–5.11)
RDW: 12.5 % (ref 11.5–15.5)
WBC: 13.1 10*3/uL — ABNORMAL HIGH (ref 4.0–10.5)
nRBC: 0 % (ref 0.0–0.2)

## 2022-04-15 LAB — COMPREHENSIVE METABOLIC PANEL
ALT: 17 U/L (ref 0–44)
AST: 27 U/L (ref 15–41)
Albumin: 3.9 g/dL (ref 3.5–5.0)
Alkaline Phosphatase: 49 U/L (ref 38–126)
Anion gap: 8 (ref 5–15)
BUN: 23 mg/dL (ref 8–23)
CO2: 23 mmol/L (ref 22–32)
Calcium: 8.8 mg/dL — ABNORMAL LOW (ref 8.9–10.3)
Chloride: 99 mmol/L (ref 98–111)
Creatinine, Ser: 0.89 mg/dL (ref 0.44–1.00)
GFR, Estimated: >60 mL/min (ref >60)
Glucose, Bld: 93 mg/dL (ref 70–99)
Potassium: 4.3 mmol/L (ref 3.5–5.1)
Sodium: 130 mmol/L — ABNORMAL LOW (ref 135–145)
Total Bilirubin: 0.6 mg/dL (ref 0.3–1.2)
Total Protein: 6.4 g/dL — ABNORMAL LOW (ref 6.5–8.1)

## 2022-04-15 LAB — URINALYSIS, ROUTINE W REFLEX MICROSCOPIC
Bilirubin Urine: NEGATIVE
Glucose, UA: NEGATIVE mg/dL
Hgb urine dipstick: NEGATIVE
Ketones, ur: NEGATIVE mg/dL
Leukocytes,Ua: NEGATIVE
Nitrite: NEGATIVE
Protein, ur: NEGATIVE mg/dL
Specific Gravity, Urine: 1.012 (ref 1.005–1.030)
pH: 6 (ref 5.0–8.0)

## 2022-04-15 LAB — LIPASE, BLOOD: Lipase: 32 U/L (ref 11–51)

## 2022-04-15 MED ORDER — MORPHINE SULFATE (PF) 4 MG/ML IV SOLN
4.0000 mg | Freq: Once | INTRAVENOUS | Status: AC
Start: 2022-04-15 — End: 2022-04-15
  Administered 2022-04-15: 4 mg via INTRAVENOUS
  Filled 2022-04-15: qty 1

## 2022-04-15 MED ORDER — IOHEXOL 300 MG/ML  SOLN
100.0000 mL | Freq: Once | INTRAMUSCULAR | Status: AC | PRN
  Administered 2022-04-15: 100 mL via INTRAVENOUS

## 2022-04-15 MED ORDER — FENTANYL CITRATE PF 50 MCG/ML IJ SOSY
50.0000 ug | PREFILLED_SYRINGE | Freq: Once | INTRAMUSCULAR | Status: AC
  Administered 2022-04-15: 50 ug via INTRAVENOUS
  Filled 2022-04-15: qty 1

## 2022-04-15 MED ORDER — ONDANSETRON HCL 4 MG/2ML IJ SOLN
4.0000 mg | Freq: Once | INTRAMUSCULAR | Status: DC
Start: 2022-04-15 — End: 2022-04-15
  Filled 2022-04-15: qty 2

## 2022-04-15 NOTE — ED Provider Notes (Signed)
Care was taken over from Dr. Dina Rich.  Patient and presented with pain in her right upper quadrant.  She had some associated pain in her right shoulder which seems to be referred pain.  She does not have any chest pain or shortness of breath which would be more concerning for ACS.  Seems that her etiology is coming from her abdomen.  She had an ultrasound which showed no acute abnormalities.  Follow-up CT scan showed no acute abnormalities.  There is some constipation.  She has had a longstanding history of constipation and she does take MiraLAX daily.  She says she has been having good bowel movements.  Her labs are nonconcerning.  No evidence of pancreatitis.  Her gallbladder/liver labs are normal.  She was given 1 dose of fentanyl for pain.  She denies need for any ongoing pain medication.  At this point I discussed the findings with the patient.  She is a bit frustrated that she does not have an identified source for the pain.  I did discuss different possibilities.  I did discuss having close follow-up with her PCP.  She may need a HIDA scan or other test if her symptoms continue.  I also discussed treatment of her constipation.  She is good with this plan.  She did have a soft tissue mass in her left buttocks area.  In speaking the patient, she does have a history of liposarcoma of her right leg.  Given this history, I did feel that the patient likely will need a biopsy of this.  I discussed this with the patient and her husband.  She actually has a follow-up appointment tomorrow with her radiation oncologist.  She will discuss it with him.  Return precautions were given.   Malvin Johns, MD 04/15/22 1055

## 2022-04-15 NOTE — ED Triage Notes (Signed)
Right shoulder pain radiating down her back that started two days and gradually worsened. Denies injury, but did have right shoulder replacement surgery in 2016

## 2022-04-15 NOTE — ED Provider Notes (Signed)
[facility] EMERGENCY DEPARTMENT Provider Note   CSN: [id number] Arrival date & time: 04/15/22  6144     History  Chief Complaint  Patient presents with   Shoulder Pain    Johnna Bollier is a 81 y.o. female.  HPI     This is an 81 year old female who presents with right upper quadrant and right flank pain.  Patient reports that she was getting into bed 2 days ago when she felt a twinge in her right side.  She states that since that time she has had increasing pain in the right upper quadrant of her abdomen and right shoulder.  She denies any injury.  Initially she thought she may have pulled a muscle but pain has become more intense.  She does not necessarily note it worse with eating.  It is not worse with range of motion of the shoulder.  She states that woke her up from her sleep this morning.  She reports nausea without vomiting.  No change in bowel movements.  Home Medications Prior to Admission medications   Medication Sig Start Date End Date Taking? Authorizing Provider  acetaminophen (TYLENOL) 500 MG tablet Take 500 mg by mouth every 4 (four) hours as needed.    [provider]  amLODipine (NORVASC) 5 MG tablet TAKE 1 TABLET BY MOUTH DAILY. 03/18/22   Burchette, Alinda Sierras, MD  b complex vitamins capsule Take 1 capsule by mouth daily.    [provider]  Calcium Carb-Cholecalciferol 600-200 MG-UNIT TABS Take 1 tablet by mouth daily.     [provider]  cephALEXin (KEFLEX) 250 MG capsule Take 250 mg by mouth daily. 02/28/20   [provider]  cholecalciferol (VITAMIN D) 1000 UNITS tablet Take 2,000 Units by mouth daily.    [provider]  docusate sodium (COLACE) 100 MG capsule Take 100 mg by mouth daily.    [provider]  gabapentin (NEURONTIN) 300 MG capsule Take 600-900 mg by mouth See admin instructions. Take 300 mg every morning and900 mg at bedtime    [provider]   HYDROcodone-acetaminophen (NORCO/VICODIN) 5-325 MG tablet Take 1 tablet by mouth every 4 (four) hours as needed for moderate pain or severe pain.  03/06/14   [provider]  ipratropium (ATROVENT) 0.06 % nasal spray PLACE 2 SPRAYS IN EACH NOSTRIL 3 TIMES A DAY 02/24/22   Burchette, Alinda Sierras, MD  levothyroxine (SYNTHROID) 137 MCG tablet Take 1 tablet (137 mcg total) by mouth daily before breakfast. 09/27/21   Burchette, Alinda Sierras, MD  loratadine-pseudoephedrine (CLARITIN-D 12-HOUR) 5-120 MG per tablet Take 1 tablet by mouth daily.    [provider]  methocarbamol (ROBAXIN) 750 MG tablet Take 750 mg by mouth 2 (two) times daily as needed for muscle spasms. 06/02/11   [provider]  metoprolol succinate (TOPROL-XL) 50 MG 24 hr tablet TAKE 1 TABLET BY MOUTH 2 TIMES DAILY. 03/18/22   Burchette, Alinda Sierras, MD  MYRBETRIQ 50 MG TB24 tablet Take 50 mg by mouth daily. 03/18/21   [provider]  olmesartan (BENICAR) 40 MG tablet TAKE 1 TABLET BY MOUTH DAILY. 03/18/22   Burchette, Alinda Sierras, MD  polyethylene glycol (MIRALAX) 17 g packet Take 17 g by mouth daily. 11/16/21   Kommor, Madison, MD  Marnee Guarneri Isoflavones (SOY BALANCE PO) Take 1 tablet by mouth daily. Soy bean extract    [provider]  zolpidem (AMBIEN) 5 MG tablet TAKE 1 TABLET BY MOUTH ONCE DAILY AT BEDTIME  11/19/21   Burchette, Alinda Sierras, MD      Allergies    Nsaids, Oxycodone-acetaminophen, Aspirin, Augmentin [amoxicillin-pot clavulanate], Ciprocin-fluocin-procin [fluocinolone], Ciprofloxacin hcl, Fentanyl, Fluclorolone, Lyrica [pregabalin], Oxycodone, Versed [midazolam], Diclofenac sodium, Hydromorphone hcl, and Voltaren gel [diclofenac sodium]    Review of Systems   Review of Systems  Constitutional:  Negative for fever.  Gastrointestinal:  Positive for abdominal pain.  Musculoskeletal:        Shoulder pain  All other systems reviewed and are negative.  Physical Exam Updated Vital Signs BP (!)  127/55   Pulse 63   Temp 97.8 F (36.6 C) (Oral)   Resp 14   SpO2 97%  Physical Exam Vitals and nursing note reviewed.  Constitutional:      Appearance: She is well-developed. She is not ill-appearing.  HENT:     Head: Normocephalic and atraumatic.  Eyes:     Pupils: Pupils are equal, round, and reactive to light.  Cardiovascular:     Rate and Rhythm: Normal rate and regular rhythm.     Heart sounds: Normal heart sounds.  Pulmonary:     Effort: Pulmonary effort is normal. No respiratory distress.     Breath sounds: No wheezing.  Abdominal:     General: Bowel sounds are normal.     Palpations: Abdomen is soft.     Tenderness: There is abdominal tenderness.     Comments: Right upper quadrant tenderness to palpation, voluntary guarding  Musculoskeletal:     Cervical back: Neck supple.     Comments: Normal range of motion right shoulder  Skin:    General: Skin is warm and dry.  Neurological:     Mental Status: She is alert and oriented to person, place, and time.  Psychiatric:        Mood and Affect: Mood normal.    ED Results / Procedures / Treatments   Labs (all labs ordered are listed, but only abnormal results are displayed) Labs Reviewed  CBC WITH DIFFERENTIAL/PLATELET - Abnormal; Notable for the following components:      Result Value   WBC 13.1 (*)    RBC 3.55 (*)    HCT 35.6 (*)    MCV 100.3 (*)    MCH 34.1 (*)    Neutro Abs 10.6 (*)    All other components within normal limits  COMPREHENSIVE METABOLIC PANEL  LIPASE, BLOOD  URINALYSIS, ROUTINE W REFLEX MICROSCOPIC    EKG EKG Interpretation  Date/Time:  Tuesday Apr 15 2022 07:09:20 EDT Ventricular Rate:  63 PR Interval:  191 QRS Duration: 98 QT Interval:  438 QTC Calculation: 449 R Axis:   51 Text Interpretation: Sinus rhythm Confirmed by Thayer Jew [phone]) on 04/15/2022 7:13:16 AM  Radiology No results found.  Procedures Procedures    Medications Ordered in ED Medications   ondansetron (ZOFRAN) injection 4 mg (4 mg Intravenous Patient Refused/Not Given 04/15/22 8469)  morphine (PF) 4 MG/ML injection 4 mg (4 mg Intravenous Given 04/15/22 6295)    ED Course/ Medical Decision Making/ A&P                           Medical Decision Making Amount and/or Complexity of Data Reviewed Labs: ordered.  Risk Prescription drug management.   This patient presents to the ED for concern of shoulder and abdominal pain, this involves an extensive number of treatment options, and is a complaint that carries with it a high risk of complications and morbidity.  I considered the following differential and admission for this acute, potentially life threatening condition.  The differential diagnosis includes gallbladder pathology with referred pain to the shoulder, pancreatitis, gastritis, atypical chest discomfort  MDM:    This is an 81 year old female who presents with right upper quadrant pain and right shoulder pain.  Initially thought she had pulled a muscle but has become progressively worse.  Most of her pain is in the right upper quadrant.  It woke her up from sleep today.  She also has referred pain to the right shoulder.  Denies any injury.  Has normal range of motion.  Does have prior reconstruction of that shoulder.  Denies numbness or tingling.  She is otherwise nontoxic and vital signs are reassuring.  Labs including CBC, CMP, lipase ordered.  She will likely need imaging potentially of her gallbladder.  However, if labs otherwise indicate, she may need further imaging such as CT.  Will await lab work.  Patient was given pain and nausea medication.  (Labs, imaging, consults)  Labs: I Ordered, and personally interpreted labs.  The pertinent results include: Pending  Imaging Studies ordered: I ordered imaging studies including pending I independently visualized and interpreted imaging. I agree with the radiologist interpretation  Additional history obtained from  husband at bedside.  External records from outside source obtained and reviewed including prior evaluations  Cardiac Monitoring: The patient was maintained on a cardiac monitor.  I personally viewed and interpreted the cardiac monitored which showed an underlying rhythm of: Normal sinus rhythm  Reevaluation: After the interventions noted above, I reevaluated the patient and found that they have :improved  Social Determinants of Health: Lives independently  Disposition: Pending  Co morbidities that complicate the patient evaluation  Past Medical History:  Diagnosis Date   Anxiety    Arthritis    Colon polyps    Complication of anesthesia    BP drops with fentyl and versed   Fatty liver    Hepatic lesion    Hepatitis    hep A  1976   History of hepatitis A    1976   History of radiation therapy    Right thigh- 07/24/19-08/12/19- Dr. Gery Pray   Hypertension    Hypothyroidism    Internal hemorrhoids    Liposarcoma of right thigh ([place]) 03/2019     Medicines Meds ordered this encounter  Medications   morphine (PF) 4 MG/ML injection 4 mg   ondansetron (ZOFRAN) injection 4 mg    I have reviewed the patients home medicines and have made adjustments as needed  Problem List / ED Course: Problem List Items Addressed This Visit   None               Final Clinical Impression(s) / ED Diagnoses Final diagnoses:  None    Rx / DC Orders ED Discharge Orders     None         Merryl Hacker, MD 04/15/22 [phone]

## 2022-04-15 NOTE — Discharge Instructions (Addendum)
Eat a bland diet for the next few days.  Drink plenty of fluids.  Continue taking your MiraLAX daily.  Follow-up with your primary care doctor within the next few days for recheck.  If you continue to have pain around your gallbladder, you may need to have further evaluation with other imaging such as a HIDA scan.  You can discuss this with your primary care doctor.  Return to the emergency room if you have any worsening symptoms such as worsening pain, vomiting, fevers or other worsening symptoms.  You also have a fat tissue mass on your left buttocks area.  Please follow-up with your doctor or oncologist regarding this.

## 2022-04-16 NOTE — Progress Notes (Signed)
Radiation Oncology         [phone] ________________________________  Name: Destiny [name] MRN: [id number]  Date: 04/17/2022  DOB: [birth year]  Follow-Up Visit Note  CC: Eulas Post, MD  Gus Puma, MD  No diagnosis found.  Diagnosis: Stage IIIB (pT3, cN0) Liposarcoma of the Right Thigh, Grade 3   Interval Since Last Radiation:  2 years, 8 months, and 1 week  Radiation Treatment Dates: 06/27/2019 through 08/12/2019 Site Technique Total Dose Dose per Fx Completed Fx Beam Energies  Extremity: Ext_Rt 3D 50/50 2 25/25 6X, 10X  Extremity: Ext_Rt_Bst 3D 16/16 2 8/8 6X   Narrative:  The patient returns today for follow-up, she was last seen here for follow-up on 07/26/20 (the patient called in this past April requesting to reestablish care). Since she was last seen, she has maintained care with Dr. Redmond Pulling (ortho) at the [facility] and has had no evidence of disease recurrence in her right thigh.   Imaging performed in the interval includes:  -- MRI of the right thigh on 10/23/20 which showed no evidence of recurrent neoplastic disease in the distal thigh (s/p sarcoma resection). (MRI also showed an increase in degree of skin thickening at the distal thigh, most pronounced medially. This was likely due to lymphedematous changes).   -- CT of the chest abdomen and pelvis on 12/18/20 showed prior right groin surgery changes, with scattered lymph nodes, and no apparent discrete solitary enlarged or matted lymphadenopathy. CT also showed: a left anterior pericardial probable cystic lesion, likely a pericardial cyst (although low-density mass and LAD aneurysm could not be completely excluded); significant degenerative changes correlated with prior lumbar spine surgery; and tiny nonspecific hepatic lesions (likely favor benign lesions such as cyst/hemangioma, though metastatic disease could not be entirely excluded).  -- MRI of the right thigh on 09/03/21 showed no  evidence of residual tumor or recurrent disease within the distal right thigh.  -- CXR on 12/24/21 showed no evidence of metastatic disease or other abnormal findings.  -- MRI of the right thigh on 03/15/22 again showed no evidence of recurrent disease within the distal thigh.  On 04/15/22, the patient presented to the ED for evaluation of increasing right shoulder and abdominal pain x 2 days. Abdominal US showed no acute abnormalities and labs were negative. No cause for her presenting pain was determined and she was given one dose of fentanyl for pain management. She was discharged with instructions to follow up with her PCP if her pain worsens. She also had a CT of the abdomen and pelvis performed which showed no findings to explain her abdominal pain. However, CT incidentally showed a 9 x 3.5 x 5.4 cm fat density mass within the left gluteal musculature. Given her history of liposarcoma, attending physician recommended obtaining a biopsy of this which we will address today.                                 Allergies:  is allergic to nsaids, oxycodone-acetaminophen, aspirin, augmentin [amoxicillin-pot clavulanate], ciprocin-fluocin-procin [fluocinolone], ciprofloxacin hcl, fentanyl, fluclorolone, lyrica [pregabalin], oxycodone, versed [midazolam], diclofenac sodium, hydromorphone hcl, and voltaren gel [diclofenac sodium].  Meds: Current Outpatient Medications  Medication Sig Dispense Refill   acetaminophen (TYLENOL) 500 MG tablet Take 500 mg by mouth every 4 (four) hours as needed.     amLODipine (NORVASC) 5 MG tablet TAKE 1 TABLET BY MOUTH DAILY. 90 tablet 1  b complex vitamins capsule Take 1 capsule by mouth daily.     Calcium Carb-Cholecalciferol 600-200 MG-UNIT TABS Take 1 tablet by mouth daily.      cephALEXin (KEFLEX) 250 MG capsule Take 250 mg by mouth daily.     cholecalciferol (VITAMIN D) 1000 UNITS tablet Take 2,000 Units by mouth daily.     docusate sodium (COLACE) 100 MG capsule Take  100 mg by mouth daily.     gabapentin (NEURONTIN) 300 MG capsule Take 600-900 mg by mouth See admin instructions. Take 300 mg every morning and900 mg at bedtime     HYDROcodone-acetaminophen (NORCO/VICODIN) 5-325 MG tablet Take 1 tablet by mouth every 4 (four) hours as needed for moderate pain or severe pain.      ipratropium (ATROVENT) 0.06 % nasal spray PLACE 2 SPRAYS IN EACH NOSTRIL 3 TIMES A DAY 15 mL 1   levothyroxine (SYNTHROID) 137 MCG tablet Take 1 tablet (137 mcg total) by mouth daily before breakfast. 90 tablet 3   loratadine-pseudoephedrine (CLARITIN-D 12-HOUR) 5-120 MG per tablet Take 1 tablet by mouth daily.     methocarbamol (ROBAXIN) 750 MG tablet Take 750 mg by mouth 2 (two) times daily as needed for muscle spasms.     metoprolol succinate (TOPROL-XL) 50 MG 24 hr tablet TAKE 1 TABLET BY MOUTH 2 TIMES DAILY. 180 tablet 1   MYRBETRIQ 50 MG TB24 tablet Take 50 mg by mouth daily.     olmesartan (BENICAR) 40 MG tablet TAKE 1 TABLET BY MOUTH DAILY. 90 tablet 1   polyethylene glycol (MIRALAX) 17 g packet Take 17 g by mouth daily. 14 each 0   Soybean-Soy Isoflavones (SOY BALANCE PO) Take 1 tablet by mouth daily. Soy bean extract     zolpidem (AMBIEN) 5 MG tablet TAKE 1 TABLET BY MOUTH ONCE DAILY AT BEDTIME 90 tablet 1   No current facility-administered medications for this encounter.    Physical Findings: The patient is in no acute distress. Patient is alert and oriented.  vitals were not taken for this visit. .  No significant changes. Lungs are clear to auscultation bilaterally. Heart has regular rate and rhythm. No palpable cervical, supraclavicular, or axillary adenopathy. Abdomen soft, non-tender, normal bowel sounds. ***   Lab Findings: Lab Results  Component Value Date   WBC 13.1 (H) 04/15/2022   HGB 12.1 04/15/2022   HCT 35.6 (L) 04/15/2022   MCV 100.3 (H) 04/15/2022   PLT 344 04/15/2022    Radiographic Findings: CT Abdomen Pelvis W Contrast  Result Date:  04/15/2022 CLINICAL DATA:  Abdominal pain, acute, nonlocalized. Right-sided abdominal pain. EXAM: CT ABDOMEN AND PELVIS WITH CONTRAST TECHNIQUE: Multidetector CT imaging of the abdomen and pelvis was performed using the standard protocol following bolus administration of intravenous contrast. RADIATION DOSE REDUCTION: This exam was performed according to the departmental dose-optimization program which includes automated exposure control, adjustment of the mA and/or kV according to patient size and/or use of iterative reconstruction technique. CONTRAST:  123m OMNIPAQUE IOHEXOL 300 MG/ML  SOLN COMPARISON:  Ultrasound same day. FINDINGS: Lower chest: Small right-sided pleural effusion with dependent atelectasis. Mild cardiomegaly. Hepatobiliary: Mild diffuse fatty change of the liver. The gallbladder is full but does not show any calcified stones, wall thickening or inflammatory change. No sign of biliary ductal dilatation. Pancreas: Normal Spleen: Normal Adrenals/Urinary Tract: Adrenal glands are normal. Kidneys are normal. No stone or hydronephrosis. Bladder is normal. Stomach/Bowel: Stomach and small intestine are normal. There is a large amount of fecal matter within the  colon which could be symptomatic. No evidence of mass or inflammation. No diverticulosis or diverticulitis. Vascular/Lymphatic: Aortic atherosclerosis. No aneurysm. IVC is normal. No adenopathy. Reproductive: Previous hysterectomy.  No pelvic mass. Other: No free fluid or air. Musculoskeletal: Surgical clips in the right groin. No evidence of mass lesion. Previous lumbosacral spinal fusion with degenerative changes in the lumbar region above that. 9 x 3.5 x 5.4 cm fat density mass within the left gluteal musculature. No aggressive features by CT. IMPRESSION: Mild fatty change of the liver. No evidence of calcified gallstones, gallbladder inflammation or ductal obstruction. Large amount of fecal matter throughout the colon which could be  symptomatic. Small right pleural effusion layering dependently with dependent atelectasis in the right lower lobe. Aortic atherosclerosis.  No aneurysm. Degenerative changes of the lumbar spine above the region of prior fusion. Fat-density mass within the left gluteal musculature measuring 9 x 3.5 x 5.4 cm. No aggressive features by imaging, but fat density masses are not accurately graded by imaging. Electronically Signed   By: Nelson Chimes M.D.   On: 04/15/2022 10:10   CT SHOULDER RIGHT WO CONTRAST  Result Date: 04/05/2022 CLINICAL DATA:  Shoulder pain. History of reverse shoulder arthroplasty in 2016 EXAM: CT OF THE UPPER RIGHT EXTREMITY WITHOUT CONTRAST TECHNIQUE: Multidetector CT imaging of the upper right extremity was performed according to the standard protocol. RADIATION DOSE REDUCTION: This exam was performed according to the departmental dose-optimization program which includes automated exposure control, adjustment of the mA and/or kV according to patient size and/or use of iterative reconstruction technique. COMPARISON:  X-ray 12/28/2014 FINDINGS: Bones/Joint/Cartilage Postsurgical changes from reverse right shoulder arthroplasty. Arthroplasty components are in their expected alignment. No dislocation. Metallic streak artifact from hardware degrades evaluation of the adjacent structures. Within this limitation, there is no evidence of periprosthetic loosening or fracture. No acromial fracture. No periprosthetic fluid collection. Ligaments Suboptimally assessed by CT. Muscles and Tendons Expected rotator cuff muscle atrophy. Soft tissues No fluid collection. No right axillary lymphadenopathy. The included portion of the right lung is clear. Aortic atherosclerosis. IMPRESSION: Postsurgical changes from reverse right shoulder arthroplasty without evidence of hardware complication. Aortic Atherosclerosis (ICD10-I70.0). Electronically Signed   By: Davina Poke D.O.   On: 04/05/2022 10:11   US  Abdomen Limited RUQ (LIVER/GB)  Result Date: 04/15/2022 CLINICAL DATA:  Hepatitis-C. Right upper quadrant pain since 2:30 a.m. EXAM: ULTRASOUND ABDOMEN LIMITED RIGHT UPPER QUADRANT COMPARISON:  [facility] 11/16/2021; abdominal ultrasound FINDINGS: Gallbladder: No gallstones or wall thickening visualized. No pericholecystic fluid. No sonographic Murphy sign noted by sonographer. Common bile duct: Diameter: 3 mm within normal limits. Liver: No focal lesion identified. Mildly increased echogenicity throughout the liver is again seen suggesting fatty infiltration. Portal vein is patent on color Doppler imaging with normal direction of blood flow towards the liver. Other: Anechoic avascular simple partially exophytic right renal cyst measuring up to 1.6 cm. IMPRESSION: 1. Normal appearance of the gallbladder. 2. Mild fatty infiltration of the liver, similar to prior. Electronically Signed   By: Yvonne Kendall M.D.   On: 04/15/2022 09:01    Impression:  Stage IIIB (pT3, cN0) Liposarcoma of the Right Thigh, Grade 3   The patient is recovering from the effects of radiation.  ***  Plan:  ***   *** minutes of total time was spent for this patient encounter, including preparation, face-to-face counseling with the patient and coordination of care, physical exam, and documentation of the encounter. ____________________________________  Blair Promise, PhD, MD  This document serves  as a record of services personally performed by Gery Pray, MD. It was created on his behalf by Roney Mans, a trained medical scribe. The creation of this record is based on the scribe's personal observations and the provider's statements to them. This document has been checked and approved by the attending provider.

## 2022-04-17 ENCOUNTER — Other Ambulatory Visit: Payer: Self-pay

## 2022-04-17 ENCOUNTER — Encounter: Payer: Self-pay | Admitting: Radiation Oncology

## 2022-04-17 ENCOUNTER — Ambulatory Visit
Admission: RE | Admit: 2022-04-17 | Discharge: 2022-04-17 | Disposition: A | Discharge status: Home / Self Care | Payer: Medicare Other | Source: Ambulatory Visit | Attending: Radiation Oncology | Admitting: Radiation Oncology

## 2022-04-17 DIAGNOSIS — Z981 Arthrodesis status: Secondary | ICD-10-CM | POA: Diagnosis not present

## 2022-04-17 DIAGNOSIS — Z8589 Personal history of malignant neoplasm of other organs and systems: Secondary | ICD-10-CM | POA: Diagnosis not present

## 2022-04-17 DIAGNOSIS — C4921 Malignant neoplasm of connective and soft tissue of right lower limb, including hip: Secondary | ICD-10-CM

## 2022-04-17 DIAGNOSIS — Z923 Personal history of irradiation: Secondary | ICD-10-CM | POA: Diagnosis not present

## 2022-04-17 DIAGNOSIS — R222 Localized swelling, mass and lump, trunk: Secondary | ICD-10-CM | POA: Insufficient documentation

## 2022-04-17 DIAGNOSIS — Z79899 Other long term (current) drug therapy: Secondary | ICD-10-CM | POA: Diagnosis not present

## 2022-04-17 DIAGNOSIS — I700 Atherosclerosis of aorta: Secondary | ICD-10-CM | POA: Insufficient documentation

## 2022-04-17 DIAGNOSIS — M47816 Spondylosis without myelopathy or radiculopathy, lumbar region: Secondary | ICD-10-CM | POA: Insufficient documentation

## 2022-04-17 DIAGNOSIS — N281 Cyst of kidney, acquired: Secondary | ICD-10-CM | POA: Insufficient documentation

## 2022-04-17 HISTORY — DX: Personal history of irradiation: Z92.3

## 2022-04-17 NOTE — Progress Notes (Signed)
Patient in for follow up after receiving radiation treatment to her right thigh. Patient completed treatment on 08/12/19. Patient reports going to the emergency room a few days ago, per patient she had a scan done and was told she had a fatty tumor to left gluteal. Patient denies pain to right thigh. Patient reports having unsteady gait. Patient continues to have lymphedema to right lower leg. Patient reports having a low energy level. Patient reports she has thick skin to back of right knee.    Patient states she currently sees Dr. Redmond Pulling q 3 month for a chest xray and q 6 months for MRI to right leg.    BP (!) 144/62 (BP Location: Left Arm, Patient Position: Sitting, Cuff Size: Normal)   Pulse 72   Temp (!) 97.3 F (36.3 C)   Resp 20   Ht '5\' 1"'$  (1.549 m)   Wt 161 lb 12.8 oz (73.4 kg)   SpO2 97%   BMI 30.57 kg/m

## 2022-04-18 ENCOUNTER — Telehealth: Payer: Self-pay | Admitting: *Deleted

## 2022-04-18 NOTE — Telephone Encounter (Signed)
Called patient to inform of MRI for 05-01-22- arrival time- 11:30 am @ [facility], no restrictions to test, patient to receive MRI results from Dr. Sondra Come on 05-08-22 @ 10 am, spoke with patient and she is aware of these appts.

## 2022-04-23 ENCOUNTER — Encounter: Payer: Self-pay | Admitting: Family Medicine

## 2022-04-23 ENCOUNTER — Ambulatory Visit (INDEPENDENT_AMBULATORY_CARE_PROVIDER_SITE_OTHER): Payer: Medicare Other | Admitting: Family Medicine

## 2022-04-23 VITALS — BP 146/64 | HR 70 | Temp 97.9°F | Ht 61.0 in | Wt 157.1 lb

## 2022-04-23 DIAGNOSIS — R1011 Right upper quadrant pain: Secondary | ICD-10-CM | POA: Diagnosis not present

## 2022-04-23 DIAGNOSIS — I10 Essential (primary) hypertension: Secondary | ICD-10-CM

## 2022-04-23 MED ORDER — AMLODIPINE BESYLATE 10 MG PO TABS: 10.0000 mg | ORAL_TABLET | Freq: Every day | ORAL | 5 refills | Status: DC

## 2022-04-23 NOTE — Progress Notes (Signed)
Established Patient Office Visit  Subjective   Patient ID: Destiny [name], female    DOB: 09-10-1941  Age: 81 y.o. MRN: [id number]  Chief Complaint  Patient presents with   Hospitalization Follow-up    HPI   Destiny [name] has history of Raynaud's disease, hypertension, fatty liver, postoperative hypothyroidism, essential tremor, osteopenia, osteoarthritis, chronic lymphedema right lower extremity, history of liposarcoma right thigh.  Here for ER follow-up.  She presented to ER on 23 May with some right upper quadrant pain radiating toward the right flank.  Urinalysis is unremarkable.  Initial concern was whether she had gallbladder related issue.  Ultrasound showed no gallstones.  No acute abnormality.  CT scan showed some nonspecific abnormalities.  No calcified stones.  She had fatty infiltration of the liver.  Large amount of fecal matter throughout the colon.  Small right pleural effusion with some atelectasis right lobe.  Also noted fat density mass left gluteal musculature.  She has been seen by radiation oncologist and has follow-up MRI with and without contrast to further assess  Lab work reviewed.  She had white count 13,000 but no fever.  Her pain is almost resolved at this time.  Other labs revealed no acute abnormality.  No nausea or vomiting.  No relation to eating.  There have been mention of possible HIDA scan but as above symptoms are improving  She has hypertension and takes amlodipine, metoprolol, Benicar.  She has chronic hyponatremia and thiazides would not be a good choice for her.  She is compliant with her medications.  Has had several readings over 841 systolic and multiple locations recently.  Past Medical History:  Diagnosis Date   Anxiety    Arthritis    Colon polyps    Complication of anesthesia    BP drops with fentyl and versed   Fatty liver    Hepatic lesion    Hepatitis    hep A  1976   History of hepatitis A    1976   History of radiation therapy     Right thigh- 07/24/19-08/12/19- Dr. Gery Pray   Hypertension    Hypothyroidism    Internal hemorrhoids    Liposarcoma of right thigh ([facility]) 03/2019   Past Surgical History:  Procedure Laterality Date   ABDOMINAL HYSTERECTOMY     APPENDECTOMY     BACK SURGERY      fusion   CESAREAN SECTION     times 2   COLONOSCOPY     EYE SURGERY Left    cataract removed   FINGER ARTHRODESIS Left 10/02/2017   Procedure: LEFT LONG FINGER ARTHRODESIS;  Surgeon: Milly Jakob, MD;  Location: [facility];  Service: Orthopedics;  Laterality: Left;   FOOT SURGERY Bilateral    FRACTURE SURGERY     broken ankle   MEDIAL PARTIAL KNEE REPLACEMENT Left    done at [facility] Right 12/28/2014   Procedure: REVERSE SHOULDER ARTHROPLASTY;  Surgeon: Nita Sells, MD;  Location: [facility];  Service: Orthopedics;  Laterality: Right;  Right reverse total shoulder arthroplasty   REVERSE TOTAL SHOULDER ARTHROPLASTY Right 12/28/2014   DR CHANDLER   rotator cufff     THYROIDECTOMY, PARTIAL     one lobe removed   VARICOSE VEIN SURGERY Right     reports that she has never smoked. She has never used smokeless tobacco. She reports current alcohol use. She reports that she does not use drugs. family history includes Atrial fibrillation in her  mother; Cancer - Other in her father; Heart disease in her mother; Pancreatic cancer in her father and maternal uncle. Allergies  Allergen Reactions   Nsaids Other (See Comments)     BRUISES EASILY Other Reaction: BRUISES EASILY Other Reaction: BRUISES EASILY    Oxycodone-Acetaminophen Other (See Comments)     VERY CONSTIPATING, does not provide pain relief   Aspirin Other (See Comments)    Bruising    Augmentin [Amoxicillin-Pot Clavulanate] Other (See Comments)    headache   Ciprocin-Fluocin-Procin [Fluocinolone]    Ciprofloxacin Hcl     tendonitis   Fentanyl Other (See Comments)    Does not work   Building surveyor  Other (See Comments)   Lyrica [Pregabalin] Other (See Comments)    dysphoria   Oxycodone Other (See Comments)    Does not work   Versed [employer] Other (See Comments)    Does not work   Diclofenac Sodium Other (See Comments) and Rash    Skin peeling. Voltaren gel-rash and skin peeling    Hydromorphone Hcl Itching     Itching with large doses - able to tolerate lower doses or shorter duration   Voltaren Gel [Diclofenac Sodium] Rash and Other (See Comments)    Skin peeling.    Review of Systems  Constitutional:  Negative for chills, fever and weight loss.  Respiratory:  Negative for cough and shortness of breath.   Cardiovascular:  Negative for chest pain.  Gastrointestinal:        See HPI  Neurological:  Negative for dizziness and headaches.     Objective:     BP (!) 146/64 (BP Location: Left Arm, Patient Position: Sitting, Cuff Size: Normal)   Pulse 70   Temp 97.9 F (36.6 C) (Oral)   Ht '5\' 1"'$  (1.549 m)   Wt 157 lb 1.6 oz (71.3 kg)   SpO2 97%   BMI 29.68 kg/m    Physical Exam Vitals reviewed.  Constitutional:      Appearance: Normal appearance.  Cardiovascular:     Rate and Rhythm: Normal rate and regular rhythm.  Pulmonary:     Effort: Pulmonary effort is normal.     Breath sounds: Normal breath sounds. No wheezing or rales.  Abdominal:     Palpations: Abdomen is soft.     Tenderness: There is no abdominal tenderness.  Musculoskeletal:     Comments: She has wrap right lower extremity secondary to chronic lymphedema.  No edema left leg.  Neurological:     Mental Status: She is alert.     No results found for any visits on 04/23/22.    The ASCVD Risk score (Arnett DK, et al., 2019) failed to calculate for the following reasons:   The 2019 ASCVD risk score is only valid for ages 21 to 26    Assessment & Plan:   #1 hypertension suboptimally controlled with multiple recent readings over 423 systolic.  Repeat after rest today left arm seated 150/62.   We will try titrating her amlodipine up to 10 mg.  Watch for edema.  Would avoid thiazides with her history of chronic hyponatremia.  Continue her Benicar and metoprolol.  Consider possible aldosterone antagonist if not tolerating increased amlodipine -Set up 1 month follow-up to reassess  #2 recent right upper quadrant abdominal pain.  No evidence for gallstones.  Recent imaging as above.  Pain improved at this time.  She did have mildly elevated white count of 13,000 which is very nonspecific.  She denies any recent fever.  We have discussed getting follow-up CBC today but she is somewhat reluctant as she states she is a difficult blood draw. -We did discuss possible HIDA scan if she has any recurrent episodes.  Pay close attention to see if this is related to any food intake.  #3 recently detected left gluteal mass of uncertain significance.  MRI pelvis with and without contrast pending per radiation oncology   Return in about 1 month (around 05/23/2022).    [name], MD

## 2022-04-23 NOTE — Patient Instructions (Signed)
Go ahead and increase the Amlodipine to 10 mg daily and watch for any edema/leg swelling.    Set up one month follow up.

## 2022-05-01 ENCOUNTER — Ambulatory Visit (HOSPITAL_COMMUNITY)
Admission: RE | Admit: 2022-05-01 | Discharge: 2022-05-01 | Disposition: A | Discharge status: Home / Self Care | Payer: Medicare Other | Source: Ambulatory Visit | Attending: Radiation Oncology | Admitting: Radiation Oncology

## 2022-05-01 DIAGNOSIS — R2241 Localized swelling, mass and lump, right lower limb: Secondary | ICD-10-CM | POA: Diagnosis not present

## 2022-05-01 DIAGNOSIS — C4921 Malignant neoplasm of connective and soft tissue of right lower limb, including hip: Secondary | ICD-10-CM | POA: Diagnosis not present

## 2022-05-01 DIAGNOSIS — D171 Benign lipomatous neoplasm of skin and subcutaneous tissue of trunk: Secondary | ICD-10-CM | POA: Diagnosis not present

## 2022-05-01 DIAGNOSIS — Z981 Arthrodesis status: Secondary | ICD-10-CM | POA: Diagnosis not present

## 2022-05-01 DIAGNOSIS — D1779 Benign lipomatous neoplasm of other sites: Secondary | ICD-10-CM | POA: Diagnosis not present

## 2022-05-01 MED ORDER — GADOBUTROL 1 MMOL/ML IV SOLN
7.0000 mL | Freq: Once | INTRAVENOUS | Status: AC | PRN
  Administered 2022-05-01: 7 mL via INTRAVENOUS

## 2022-05-07 NOTE — Progress Notes (Signed)
Radiation Oncology         [phone] ________________________________  Name: Destiny [name] MRN: [id number]  Date: 05/08/2022  DOB: [birth year]  Follow-Up Visit Note  CC: Eulas Post, MD  Gus Puma, MD    ICD-10-CM   1. Liposarcoma of right thigh (H[facility])  C49.21       Diagnosis: Stage IIIB (pT3, cN0) Liposarcoma of the Right Thigh, Grade 3   Interval Since Last Radiation: 2 years, 8 months, and 27 days   Radiation Treatment Dates: 06/27/2019 through 08/12/2019 Site Technique Total Dose Dose per Fx Completed Fx Beam Energies  Extremity: Ext_Rt 3D 50/50 2 25/25 6X, 10X  Extremity: Ext_Rt_Bst 3D 16/16 2 8/8 6X    Narrative:  The patient returns today for routine follow-up and to review recent MRI, she was last seen here for follow up on 04/17/22. To review from her last visit, CT scan revived at that time revealed a new rather large fat density mass in the left buttocks area. The patient denied any discomfort or pain to this area, however, given her prior history, I ordered an MRI for further evaluation of this.   MRI of the pelvis on 05/01/22 further revealed a 5.9 x 2.9 x 8.8 cm well-defined lipomatous mass between the left quadratus femoris muscle and gluteus maximus muscles; most consistent with a simple lipoma. No other abnormal findings or evidence of metastatic disease were appreciated.    She reports no changes in her medical history since my last follow-up appointment.                      Allergies:  is allergic to nsaids, oxycodone-acetaminophen, aspirin, augmentin [amoxicillin-pot clavulanate], ciprocin-fluocin-procin [fluocinolone], ciprofloxacin hcl, fentanyl, fluclorolone, lyrica [pregabalin], oxycodone, versed [midazolam], diclofenac sodium, hydromorphone hcl, and voltaren gel [diclofenac sodium].  Meds: Current Outpatient Medications  Medication Sig Dispense Refill   acetaminophen (TYLENOL) 500 MG tablet Take 500 mg by mouth every 4 (four) hours as  needed.     amLODipine (NORVASC) 10 MG tablet Take 1 tablet (10 mg total) by mouth daily. 30 tablet 5   b complex vitamins capsule Take 1 capsule by mouth daily.     Calcium Carb-Cholecalciferol 600-200 MG-UNIT TABS Take 1 tablet by mouth daily.      cephALEXin (KEFLEX) 250 MG capsule Take 250 mg by mouth daily.     cholecalciferol (VITAMIN D) 1000 UNITS tablet Take 2,000 Units by mouth daily.     docusate sodium (COLACE) 100 MG capsule Take 100 mg by mouth daily.     gabapentin (NEURONTIN) 300 MG capsule Take 600-900 mg by mouth See admin instructions. Take 300 mg every morning and900 mg at bedtime     HYDROcodone-acetaminophen (NORCO/VICODIN) 5-325 MG tablet Take 1 tablet by mouth every 4 (four) hours as needed for moderate pain or severe pain.      ipratropium (ATROVENT) 0.06 % nasal spray PLACE 2 SPRAYS IN EACH NOSTRIL 3 TIMES A DAY 15 mL 1   levothyroxine (SYNTHROID) 137 MCG tablet Take 1 tablet (137 mcg total) by mouth daily before breakfast. 90 tablet 3   loratadine-pseudoephedrine (CLARITIN-D 12-HOUR) 5-120 MG per tablet Take 1 tablet by mouth daily.     methocarbamol (ROBAXIN) 750 MG tablet Take 750 mg by mouth 2 (two) times daily as needed for muscle spasms.     metoprolol succinate (TOPROL-XL) 50 MG 24 hr tablet TAKE 1 TABLET BY MOUTH 2 TIMES DAILY. 180 tablet 1   MYRBETRIQ 50  MG TB24 tablet Take 50 mg by mouth daily.     olmesartan (BENICAR) 40 MG tablet TAKE 1 TABLET BY MOUTH DAILY. 90 tablet 1   polyethylene glycol (MIRALAX) 17 g packet Take 17 g by mouth daily. 14 each 0   Soybean-Soy Isoflavones (SOY BALANCE PO) Take 1 tablet by mouth daily. Soy bean extract     zolpidem (AMBIEN) 5 MG tablet TAKE 1 TABLET BY MOUTH ONCE DAILY AT BEDTIME 90 tablet 1   No current facility-administered medications for this encounter.    Physical Findings: The patient is in no acute distress. Patient is alert and oriented.  weight is 159 lb (72.1 kg). Her temperature is 98.1 F (36.7 C). Her  blood pressure is 122/58 (abnormal) and her pulse is 68. Her respiration is 18 and oxygen saturation is 98%. .  No significant changes. Lungs are clear to auscultation bilaterally. Heart has regular rate and rhythm. No palpable cervical, supraclavicular, or axillary adenopathy. Abdomen soft, non-tender, normal bowel sounds.  Right thigh area not examined today in light of recent examination by myself.   Lab Findings: Lab Results  Component Value Date   WBC 13.1 (H) 04/15/2022   HGB 12.1 04/15/2022   HCT 35.6 (L) 04/15/2022   MCV 100.3 (H) 04/15/2022   PLT 344 04/15/2022    Radiographic Findings: MR Pelvis W Wo Contrast  Result Date: 05/02/2022 CLINICAL DATA:  History of right thigh liposarcoma. Left gluteal mass partially visualized on the CT scan dated 04/15/2022. Patient presents for definitive characterization. EXAM: MRI PELVIS WITHOUT AND WITH CONTRAST TECHNIQUE: Multiplanar multisequence MR imaging of the pelvis was performed both before and after administration of intravenous contrast. CONTRAST:  70m GADAVIST GADOBUTROL 1 MMOL/ML IV SOLN COMPARISON:  CT abdomen/pelvis 04/15/2022 FINDINGS: Bones/Joint/Cartilage No fracture or dislocation. Normal alignment. No joint effusion. No marrow signal abnormality. No aggressive osseous lesion. Partially visualized posterior lumbar fusion at L4-5. Ligaments Collateral ligaments are intact.  Lisfranc ligament is intact. Muscles and Tendons No intramuscular hematoma or fluid collection. Severe atrophy of the gluteus minimus muscle bilaterally. Mild atrophy of the right gluteus medius muscle. Severe at focal atrophy of the right semitendinosis muscle. Remainder of the muscles demonstrate no focal abnormality. Soft tissue No fluid collection or hematoma. 5.9 x 2.9 x 8.8 cm well-defined lipomatous mass between the left quadratus femoris muscle and gluteus maximus muscles. No mural nodule or enhancing septations. IMPRESSION: 1. A 5.9 x 2.9 x 8.8 cm well-defined  lipomatous mass between the left quadratus femoris muscle and gluteus maximus muscles most consistent with a simple lipoma. No further evaluation recommended. 2. No evidence of metastatic disease. Electronically Signed   By: HKathreen DevoidM.D.   On: 05/02/2022 16:56   CT Abdomen Pelvis W Contrast  Result Date: 04/15/2022 CLINICAL DATA:  Abdominal pain, acute, nonlocalized. Right-sided abdominal pain. EXAM: CT ABDOMEN AND PELVIS WITH CONTRAST TECHNIQUE: Multidetector CT imaging of the abdomen and pelvis was performed using the standard protocol following bolus administration of intravenous contrast. RADIATION DOSE REDUCTION: This exam was performed according to the departmental dose-optimization program which includes automated exposure control, adjustment of the mA and/or kV according to patient size and/or use of iterative reconstruction technique. CONTRAST:  1018mOMNIPAQUE IOHEXOL 300 MG/ML  SOLN COMPARISON:  Ultrasound same day. FINDINGS: Lower chest: Small right-sided pleural effusion with dependent atelectasis. Mild cardiomegaly. Hepatobiliary: Mild diffuse fatty change of the liver. The gallbladder is full but does not show any calcified stones, wall thickening or inflammatory change. No sign of  biliary ductal dilatation. Pancreas: Normal Spleen: Normal Adrenals/Urinary Tract: Adrenal glands are normal. Kidneys are normal. No stone or hydronephrosis. Bladder is normal. Stomach/Bowel: Stomach and small intestine are normal. There is a large amount of fecal matter within the colon which could be symptomatic. No evidence of mass or inflammation. No diverticulosis or diverticulitis. Vascular/Lymphatic: Aortic atherosclerosis. No aneurysm. IVC is normal. No adenopathy. Reproductive: Previous hysterectomy.  No pelvic mass. Other: No free fluid or air. Musculoskeletal: Surgical clips in the right groin. No evidence of mass lesion. Previous lumbosacral spinal fusion with degenerative changes in the lumbar region  above that. 9 x 3.5 x 5.4 cm fat density mass within the left gluteal musculature. No aggressive features by CT. IMPRESSION: Mild fatty change of the liver. No evidence of calcified gallstones, gallbladder inflammation or ductal obstruction. Large amount of fecal matter throughout the colon which could be symptomatic. Small right pleural effusion layering dependently with dependent atelectasis in the right lower lobe. Aortic atherosclerosis.  No aneurysm. Degenerative changes of the lumbar spine above the region of prior fusion. Fat-density mass within the left gluteal musculature measuring 9 x 3.5 x 5.4 cm. No aggressive features by imaging, but fat density masses are not accurately graded by imaging. Electronically Signed   By: Nelson Chimes M.D.   On: 04/15/2022 10:10   US Abdomen Limited RUQ (LIVER/GB)  Result Date: 04/15/2022 CLINICAL DATA:  Hepatitis-C. Right upper quadrant pain since 2:30 a.m. EXAM: ULTRASOUND ABDOMEN LIMITED RIGHT UPPER QUADRANT COMPARISON:  [facility] 11/16/2021; abdominal ultrasound FINDINGS: Gallbladder: No gallstones or wall thickening visualized. No pericholecystic fluid. No sonographic Murphy sign noted by sonographer. Common bile duct: Diameter: 3 mm within normal limits. Liver: No focal lesion identified. Mildly increased echogenicity throughout the liver is again seen suggesting fatty infiltration. Portal vein is patent on color Doppler imaging with normal direction of blood flow towards the liver. Other: Anechoic avascular simple partially exophytic right renal cyst measuring up to 1.6 cm. IMPRESSION: 1. Normal appearance of the gallbladder. 2. Mild fatty infiltration of the liver, similar to prior. Electronically Signed   By: Yvonne Kendall M.D.   On: 04/15/2022 09:01    Impression: Stage IIIB (pT3, cN0) Liposarcoma of the Right Thigh, Grade 3   Recent MRI shows what appears to be a benign lipoma in the left pelvis region.  However given the patient's prior history of liposarcoma  would like for her to see Dr.Scott Redmond Pulling her orthopedic surgical oncologist at [facility] to review her MRI.  We have requested copies of these images to be forwarded to his office.  The patient is scheduled to see him in July for exam and chest x-ray.  She is wishing to transfer her follow-up to our department given her residence in a assisted living and difficulties with transportation.  I recommended she discuss this with Dr. Redmond Pulling with follow-up in July.  Plan: Routine follow-up in November.   15 minutes of total time was spent for this patient encounter, including preparation, face-to-face counseling with the patient and coordination of care, physical exam, and documentation of the encounter. ____________________________________  Blair Promise, PhD, MD  This document serves as a record of services personally performed by Gery Pray, MD. It was created on his behalf by Roney Mans, a trained medical scribe. The creation of this record is based on the scribe's personal observations and the provider's statements to them. This document has been checked and approved by the attending provider.

## 2022-05-08 ENCOUNTER — Ambulatory Visit
Admission: RE | Admit: 2022-05-08 | Discharge: 2022-05-08 | Disposition: A | Discharge status: Home / Self Care | Payer: Medicare Other | Source: Ambulatory Visit | Attending: Radiation Oncology | Admitting: Radiation Oncology

## 2022-05-08 ENCOUNTER — Other Ambulatory Visit: Payer: Self-pay

## 2022-05-08 DIAGNOSIS — N281 Cyst of kidney, acquired: Secondary | ICD-10-CM | POA: Insufficient documentation

## 2022-05-08 DIAGNOSIS — I700 Atherosclerosis of aorta: Secondary | ICD-10-CM | POA: Insufficient documentation

## 2022-05-08 DIAGNOSIS — Z923 Personal history of irradiation: Secondary | ICD-10-CM | POA: Diagnosis not present

## 2022-05-08 DIAGNOSIS — Z79890 Hormone replacement therapy (postmenopausal): Secondary | ICD-10-CM | POA: Diagnosis not present

## 2022-05-08 DIAGNOSIS — Z8589 Personal history of malignant neoplasm of other organs and systems: Secondary | ICD-10-CM | POA: Insufficient documentation

## 2022-05-08 DIAGNOSIS — C4921 Malignant neoplasm of connective and soft tissue of right lower limb, including hip: Secondary | ICD-10-CM | POA: Diagnosis not present

## 2022-05-08 DIAGNOSIS — Z79899 Other long term (current) drug therapy: Secondary | ICD-10-CM | POA: Insufficient documentation

## 2022-05-08 NOTE — Progress Notes (Signed)
Destiny [name] is here after radiation to her right thigh in September 2020.  She reports having lymphedema in her right leg and is using compression bandages and a compression machine.  She reports her skin is puckered and thick where the cancer was.  She is wondering if she can change her follow ups with Dr. Redmond Pulling to Dr. Sondra Come.  Her next appointment with Dr. Redmond Pulling is scheduled for 06/17/22 with a chest x ray.

## 2022-05-12 ENCOUNTER — Encounter: Payer: Self-pay | Admitting: Family Medicine

## 2022-05-13 MED ORDER — AMLODIPINE BESYLATE 5 MG PO TABS: 5.0000 mg | ORAL_TABLET | Freq: Every day | ORAL | 0 refills | Status: DC

## 2022-05-15 ENCOUNTER — Encounter: Payer: Self-pay | Admitting: Oncology

## 2022-05-15 NOTE — Progress Notes (Signed)
Requested powershare of MRI pelvis from 05/01/22 to [facility] attn: Dr. Redmond Pulling with [facility].

## 2022-05-19 ENCOUNTER — Other Ambulatory Visit: Payer: Self-pay | Admitting: Family Medicine

## 2022-05-21 ENCOUNTER — Ambulatory Visit: Payer: Medicare Other | Admitting: Family Medicine

## 2022-05-23 ENCOUNTER — Encounter: Payer: Self-pay | Admitting: Family Medicine

## 2022-05-23 ENCOUNTER — Ambulatory Visit (INDEPENDENT_AMBULATORY_CARE_PROVIDER_SITE_OTHER): Payer: Medicare Other | Admitting: Family Medicine

## 2022-05-23 VITALS — BP 144/60 | HR 65 | Temp 97.9°F | Ht 61.0 in | Wt 157.8 lb

## 2022-05-23 DIAGNOSIS — E039 Hypothyroidism, unspecified: Secondary | ICD-10-CM

## 2022-05-23 DIAGNOSIS — I10 Essential (primary) hypertension: Secondary | ICD-10-CM

## 2022-05-23 MED ORDER — HYDRALAZINE HCL 25 MG PO TABS: ORAL_TABLET | ORAL | 5 refills | Status: DC

## 2022-05-23 NOTE — Progress Notes (Signed)
Established Patient Office Visit  Subjective   Patient ID: Destiny [name], female    DOB: [birth year]  Age: 81 y.o. MRN: [id number]  Chief Complaint  Patient presents with   Follow-up   Edema    HPI   Destiny [name] has hypertension with recent poor control.  She takes amlodipine, metoprolol, and Benicar.  Prior hyponatremia with thiazides.  We had recently increased her amlodipine to 10 mg but she had some increased edema and is now back on 5 mg.  She states she has had tendencies toward hyperkalemia in the past and she attributes this somewhat to eating lots of fruit daily.  She watches her salt intake fairly closely.  Has had some persistent mild edema but just in the foot and ankle on the left.  She has some chronic lymphedema right lower extremity from prior surgery  She has had several home readings over 140s and some over 150s recently.  No headaches or dizziness.  She had recent gluteal mass had MRI which confirm likely lipoma.  She and her husband just recently moved back to their home in [facility] from [facility].  They were only there for about 1 month but were not happy with her stay there.  Past Medical History:  Diagnosis Date   Anxiety    Arthritis    Colon polyps    Complication of anesthesia    BP drops with fentyl and versed   Fatty liver    Hepatic lesion    Hepatitis    hep A  1976   History of hepatitis A    1976   History of radiation therapy    Right thigh- 07/24/19-08/12/19- Dr. Gery Pray   Hypertension    Hypothyroidism    Internal hemorrhoids    Liposarcoma of right thigh ([place]) 03/2019   Past Surgical History:  Procedure Laterality Date   ABDOMINAL HYSTERECTOMY     APPENDECTOMY     BACK SURGERY      fusion   CESAREAN SECTION     times 2   COLONOSCOPY     EYE SURGERY Left    cataract removed   FINGER ARTHRODESIS Left 10/02/2017   Procedure: LEFT LONG FINGER ARTHRODESIS;  Surgeon: Milly Jakob, MD;  Location: [facility];  Service: Orthopedics;  Laterality: Left;   FOOT SURGERY Bilateral    FRACTURE SURGERY     broken ankle   MEDIAL PARTIAL KNEE REPLACEMENT Left    done at [facility] Right 12/28/2014   Procedure: REVERSE SHOULDER ARTHROPLASTY;  Surgeon: Nita Sells, MD;  Location: [facility];  Service: Orthopedics;  Laterality: Right;  Right reverse total shoulder arthroplasty   REVERSE TOTAL SHOULDER ARTHROPLASTY Right 12/28/2014   DR CHANDLER   rotator cufff     THYROIDECTOMY, PARTIAL     one lobe removed   VARICOSE VEIN SURGERY Right     reports that she has never smoked. She has never used smokeless tobacco. She reports current alcohol use. She reports that she does not use drugs. family history includes Atrial fibrillation in her mother; Cancer - Other in her father; Heart disease in her mother; Pancreatic cancer in her father and maternal uncle. Allergies  Allergen Reactions   Nsaids Other (See Comments)     BRUISES EASILY Other Reaction: BRUISES EASILY Other Reaction: BRUISES EASILY    Oxycodone-Acetaminophen Other (See Comments)     VERY CONSTIPATING, does not provide pain relief   Aspirin Other (  See Comments)    Bruising    Augmentin [Amoxicillin-Pot Clavulanate] Other (See Comments)    headache   Ciprocin-Fluocin-Procin [Fluocinolone]    Ciprofloxacin Hcl     tendonitis   Fentanyl Other (See Comments)    Does not work   Building surveyor Other (See Comments)   Lyrica [Pregabalin] Other (See Comments)    dysphoria   Oxycodone Other (See Comments)    Does not work   Versed [employer] Other (See Comments)    Does not work   Diclofenac Sodium Other (See Comments) and Rash    Skin peeling. Voltaren gel-rash and skin peeling    Hydromorphone Hcl Itching     Itching with large doses - able to tolerate lower doses or shorter duration   Voltaren Gel [Diclofenac Sodium] Rash and Other (See Comments)    Skin peeling.    Review of Systems   Constitutional:  Negative for malaise/fatigue.  Eyes:  Negative for blurred vision.  Respiratory:  Negative for shortness of breath.   Cardiovascular:  Positive for leg swelling. Negative for chest pain and orthopnea.  Neurological:  Negative for dizziness, weakness and headaches.      Objective:     BP (!) 144/60 (BP Location: Left Arm, Cuff Size: Normal)   Pulse 65   Temp 97.9 F (36.6 C) (Oral)   Ht '5\' 1"'$  (1.549 m)   Wt 157 lb 12.8 oz (71.6 kg)   SpO2 97%   BMI 29.82 kg/m    Physical Exam Constitutional:      Appearance: She is well-developed.  Eyes:     Pupils: Pupils are equal, round, and reactive to light.  Neck:     Thyroid: No thyromegaly.     Vascular: No JVD.  Cardiovascular:     Rate and Rhythm: Normal rate and regular rhythm.     Heart sounds:     No gallop.  Pulmonary:     Effort: Pulmonary effort is normal. No respiratory distress.     Breath sounds: Normal breath sounds. No wheezing or rales.  Musculoskeletal:     Cervical back: Neck supple.     Comments: She has compression on right lower extremity.  Left lower extremity reveals mild nonpitting edema ankle and foot but sparing of the leg  Neurological:     Mental Status: She is alert.      No results found for any visits on 05/23/22.  Last CBC Lab Results  Component Value Date   WBC 13.1 (H) 04/15/2022   HGB 12.1 04/15/2022   HCT 35.6 (L) 04/15/2022   MCV 100.3 (H) 04/15/2022   MCH 34.1 (H) 04/15/2022   RDW 12.5 04/15/2022   PLT 344 32/35/5732   Last metabolic panel Lab Results  Component Value Date   GLUCOSE 93 04/15/2022   NA 130 (L) 04/15/2022   K 4.3 04/15/2022   CL 99 04/15/2022   CO2 23 04/15/2022   BUN 23 04/15/2022   CREATININE 0.89 04/15/2022   GFRNONAA >60 04/15/2022   CALCIUM 8.8 (L) 04/15/2022   PROT 6.4 (L) 04/15/2022   ALBUMIN 3.9 04/15/2022   BILITOT 0.6 04/15/2022   ALKPHOS 49 04/15/2022   AST 27 04/15/2022   ALT 17 04/15/2022   ANIONGAP 8 04/15/2022       The ASCVD Risk score (Arnett DK, et al., 2019) failed to calculate for the following reasons:   The 2019 ASCVD risk score is only valid for ages 64 to 49    Assessment & Plan:   #  1 hypertension with suboptimal control currently on amlodipine 5 mg daily, Benicar 40 mg daily, and metoprolol XL 50 mg twice daily.  Previous intolerance with thiazides with hyponatremia.  Recent adverse side effect with higher dose amlodipine with secondary edema.  She had tendencies toward hyperkalemia so Aldactone may not be a good option. -We did discuss possible addition of low-dose hydralazine 25 mg twice daily -Continue to watch sodium intake closely -Set up 1 month follow-up  #2 hypothyroidism.  She is on replacement.  Last TSH back in November stable.  Needs recheck in a few months  Return in about 1 month (around 06/22/2022).    [name], MD

## 2022-06-09 DIAGNOSIS — M79604 Pain in right leg: Secondary | ICD-10-CM | POA: Diagnosis not present

## 2022-06-09 DIAGNOSIS — Z79891 Long term (current) use of opiate analgesic: Secondary | ICD-10-CM | POA: Diagnosis not present

## 2022-06-09 DIAGNOSIS — G894 Chronic pain syndrome: Secondary | ICD-10-CM | POA: Diagnosis not present

## 2022-06-09 DIAGNOSIS — M25551 Pain in right hip: Secondary | ICD-10-CM | POA: Diagnosis not present

## 2022-06-17 DIAGNOSIS — J90 Pleural effusion, not elsewhere classified: Secondary | ICD-10-CM | POA: Diagnosis not present

## 2022-06-17 DIAGNOSIS — C4921 Malignant neoplasm of connective and soft tissue of right lower limb, including hip: Secondary | ICD-10-CM | POA: Diagnosis not present

## 2022-06-17 DIAGNOSIS — R918 Other nonspecific abnormal finding of lung field: Secondary | ICD-10-CM | POA: Diagnosis not present

## 2022-06-17 DIAGNOSIS — D1724 Benign lipomatous neoplasm of skin and subcutaneous tissue of left leg: Secondary | ICD-10-CM | POA: Diagnosis not present

## 2022-06-23 ENCOUNTER — Ambulatory Visit (INDEPENDENT_AMBULATORY_CARE_PROVIDER_SITE_OTHER): Payer: Medicare Other | Admitting: Family Medicine

## 2022-06-23 ENCOUNTER — Encounter: Payer: Self-pay | Admitting: Family Medicine

## 2022-06-23 VITALS — BP 126/60 | HR 66 | Temp 98.1°F | Ht 61.0 in | Wt 160.0 lb

## 2022-06-23 DIAGNOSIS — C4921 Malignant neoplasm of connective and soft tissue of right lower limb, including hip: Secondary | ICD-10-CM | POA: Diagnosis not present

## 2022-06-23 DIAGNOSIS — J90 Pleural effusion, not elsewhere classified: Secondary | ICD-10-CM

## 2022-06-23 DIAGNOSIS — I10 Essential (primary) hypertension: Secondary | ICD-10-CM

## 2022-06-23 NOTE — Progress Notes (Signed)
Established Patient Office Visit  Subjective   Patient ID: Destiny [name], female    DOB: 01-Nov-1941  Age: 81 y.o. MRN: [id number]  Chief Complaint  Patient presents with   Follow-up    HPI   Destiny [name] is seen for follow-up of elevated blood pressure last visit.  She takes amlodipine 5 mg daily, Benicar 40 mg daily, Toprol-XL 50 mg daily.  She had previous issues with edema with higher doses of amlodipine and hyponatremia with thiazides.  For that reason, we added hydralazine 25 mg twice daily.  Blood pressure is much improved today and this is also consistent with multiple home readings.  No dizziness.  She brings in some results she had recently from visit with her orthopedist over at [facility].  She had chest x-ray which showed evidence for small right sided effusion.  Also there was comment of "mild right basilar density ".  No follow-up imaging was recommended.  Patient has had occasional cough but not consistently.  She feels like she may have some intermittent mild wheezing.  Appetite and weight stable.  No dyspnea.  She does have history of liposarcoma right thigh and no evidence for recurrence by recent imaging.  She has some chronic lymphedema right lower extremity and wears compression for that.  Past Medical History:  Diagnosis Date   Anxiety    Arthritis    Colon polyps    Complication of anesthesia    BP drops with fentyl and versed   Fatty liver    Hepatic lesion    Hepatitis    hep A  1976   History of hepatitis A    1976   History of radiation therapy    Right thigh- 07/24/19-08/12/19- Dr. Gery Pray   Hypertension    Hypothyroidism    Internal hemorrhoids    Liposarcoma of right thigh ([place]) 03/2019   Past Surgical History:  Procedure Laterality Date   ABDOMINAL HYSTERECTOMY     APPENDECTOMY     BACK SURGERY      fusion   CESAREAN SECTION     times 2   COLONOSCOPY     EYE SURGERY Left    cataract removed   FINGER ARTHRODESIS Left 10/02/2017    Procedure: LEFT LONG FINGER ARTHRODESIS;  Surgeon: Milly Jakob, MD;  Location: [facility];  Service: Orthopedics;  Laterality: Left;   FOOT SURGERY Bilateral    FRACTURE SURGERY     broken ankle   MEDIAL PARTIAL KNEE REPLACEMENT Left    done at [facility] Right 12/28/2014   Procedure: REVERSE SHOULDER ARTHROPLASTY;  Surgeon: Nita Sells, MD;  Location: [facility];  Service: Orthopedics;  Laterality: Right;  Right reverse total shoulder arthroplasty   REVERSE TOTAL SHOULDER ARTHROPLASTY Right 12/28/2014   DR CHANDLER   rotator cufff     THYROIDECTOMY, PARTIAL     one lobe removed   VARICOSE VEIN SURGERY Right     reports that she has never smoked. She has never used smokeless tobacco. She reports current alcohol use. She reports that she does not use drugs. family history includes Atrial fibrillation in her mother; Cancer - Other in her father; Heart disease in her mother; Pancreatic cancer in her father and maternal uncle. Allergies  Allergen Reactions   Nsaids Other (See Comments)     BRUISES EASILY Other Reaction: BRUISES EASILY Other Reaction: BRUISES EASILY    Oxycodone-Acetaminophen Other (See Comments)     VERY CONSTIPATING, does  not provide pain relief   Aspirin Other (See Comments)    Bruising    Augmentin [Amoxicillin-Pot Clavulanate] Other (See Comments)    headache   Ciprocin-Fluocin-Procin [Fluocinolone]    Ciprofloxacin Hcl     tendonitis   Fentanyl Other (See Comments)    Does not work   Building surveyor Other (See Comments)   Lyrica [Pregabalin] Other (See Comments)    dysphoria   Oxycodone Other (See Comments)    Does not work   Versed [employer] Other (See Comments)    Does not work   Diclofenac Sodium Other (See Comments) and Rash    Skin peeling. Voltaren gel-rash and skin peeling    Hydromorphone Hcl Itching     Itching with large doses - able to tolerate lower doses or shorter duration    Voltaren Gel [Diclofenac Sodium] Rash and Other (See Comments)    Skin peeling.    Review of Systems  Constitutional:  Negative for chills, fever and malaise/fatigue.  Eyes:  Negative for blurred vision.  Respiratory:  Negative for hemoptysis and shortness of breath.   Cardiovascular:  Negative for chest pain.  Genitourinary:  Negative for dysuria.  Neurological:  Negative for dizziness, weakness and headaches.      Objective:     BP 126/60 (BP Location: Left Arm, Patient Position: Sitting, Cuff Size: Normal)   Pulse 66   Temp 98.1 F (36.7 C) (Oral)   Ht '5\' 1"'$  (1.549 m)   Wt 160 lb (72.6 kg)   SpO2 97%   BMI 30.23 kg/m  BP Readings from Last 3 Encounters:  06/23/22 126/60  05/23/22 (!) 144/60  05/08/22 (!) 122/58   Wt Readings from Last 3 Encounters:  06/23/22 160 lb (72.6 kg)  05/23/22 157 lb 12.8 oz (71.6 kg)  05/08/22 159 lb (72.1 kg)      Physical Exam Vitals reviewed.  Constitutional:      Appearance: Normal appearance.  Cardiovascular:     Rate and Rhythm: Normal rate and regular rhythm.  Pulmonary:     Comments: Clear breath sounds throughout.  Slightly diminished breath sounds right base compared to the left.  No wheezes.  No rales. Musculoskeletal:     Comments: Compression wrap right lower extremity.  No edema left lower extremity  Neurological:     General: No focal deficit present.     Mental Status: She is alert.      No results found for any visits on 06/23/22.    The ASCVD Risk score (Arnett DK, et al., 2019) failed to calculate for the following reasons:   The 2019 ASCVD risk score is only valid for ages 66 to 35    Assessment & Plan:   #1 hypertension.  Improved with recent addition of hydralazine.  Continue current regimen.  Continue home monitoring.  #2 small right pleural effusion noted on recent chest x-ray per orthopedist.  No concerning acute symptoms.  Future chest x-ray ordered for couple weeks from now.  If effusion not  resolving at that point --especially if increased consider further imaging with CAT scan  #3 history of liposarcoma right thigh with recent imaging showing no recurrence.   Return in about 3 months (around 09/23/2022).    [name], MD

## 2022-06-23 NOTE — Patient Instructions (Signed)
Set up follow up CXR in a couple of weeks to reassess the right pleural effusion.

## 2022-06-25 ENCOUNTER — Other Ambulatory Visit: Payer: Self-pay | Admitting: Family Medicine

## 2022-07-08 ENCOUNTER — Ambulatory Visit (INDEPENDENT_AMBULATORY_CARE_PROVIDER_SITE_OTHER): Payer: Medicare Other

## 2022-07-08 ENCOUNTER — Other Ambulatory Visit: Payer: Medicare Other

## 2022-07-08 DIAGNOSIS — J90 Pleural effusion, not elsewhere classified: Secondary | ICD-10-CM

## 2022-07-11 DIAGNOSIS — M79675 Pain in left toe(s): Secondary | ICD-10-CM | POA: Diagnosis not present

## 2022-08-01 ENCOUNTER — Ambulatory Visit: Payer: Medicare Other

## 2022-08-01 DIAGNOSIS — Z23 Encounter for immunization: Secondary | ICD-10-CM | POA: Diagnosis not present

## 2022-08-14 DIAGNOSIS — Z79891 Long term (current) use of opiate analgesic: Secondary | ICD-10-CM | POA: Diagnosis not present

## 2022-08-14 DIAGNOSIS — M79604 Pain in right leg: Secondary | ICD-10-CM | POA: Diagnosis not present

## 2022-08-14 DIAGNOSIS — G894 Chronic pain syndrome: Secondary | ICD-10-CM | POA: Diagnosis not present

## 2022-08-14 DIAGNOSIS — M25551 Pain in right hip: Secondary | ICD-10-CM | POA: Diagnosis not present

## 2022-08-19 ENCOUNTER — Other Ambulatory Visit: Payer: Self-pay | Admitting: Radiation Oncology

## 2022-08-19 ENCOUNTER — Ambulatory Visit: Payer: Medicare Other | Attending: Radiation Oncology | Admitting: Rehabilitation

## 2022-08-19 ENCOUNTER — Encounter: Payer: Self-pay | Admitting: Rehabilitation

## 2022-08-19 ENCOUNTER — Other Ambulatory Visit: Payer: Self-pay

## 2022-08-19 DIAGNOSIS — M6281 Muscle weakness (generalized): Secondary | ICD-10-CM | POA: Insufficient documentation

## 2022-08-19 DIAGNOSIS — I890 Lymphedema, not elsewhere classified: Secondary | ICD-10-CM | POA: Insufficient documentation

## 2022-08-19 DIAGNOSIS — C4921 Malignant neoplasm of connective and soft tissue of right lower limb, including hip: Secondary | ICD-10-CM | POA: Diagnosis not present

## 2022-08-19 DIAGNOSIS — R2689 Other abnormalities of gait and mobility: Secondary | ICD-10-CM | POA: Insufficient documentation

## 2022-08-19 NOTE — Therapy (Signed)
[facility] @ [place], [place], [address] Phone: [phone]   Fax:  [phone]  Physical Therapy Evaluation  Patient Details  Name: Destiny [name] MRN: [id number] Date of Birth: [birth year] Referring Provider (PT): Dr. Gery Pray   Encounter Date: 08/19/2022   PT End of Session - 08/19/22 1104     Visit Number 1    Number of Visits 1    Date for PT Re-Evaluation 08/19/22    PT Start Time 9678    PT Stop Time 1140    PT Time Calculation (min) 38 min    Activity Tolerance Patient tolerated treatment well    Behavior During Therapy [facility] for tasks assessed/performed             Past Medical History:  Diagnosis Date   Anxiety    Arthritis    Colon polyps    Complication of anesthesia    BP drops with fentyl and versed   Fatty liver    Hepatic lesion    Hepatitis    hep A  1976   History of hepatitis A    1976   History of radiation therapy    Right thigh- 07/24/19-08/12/19- Dr. Gery Pray   Hypertension    Hypothyroidism    Internal hemorrhoids    Liposarcoma of right thigh ([place]) 03/2019    Past Surgical History:  Procedure Laterality Date   ABDOMINAL HYSTERECTOMY     APPENDECTOMY     BACK SURGERY      fusion   CESAREAN SECTION     times 2   COLONOSCOPY     EYE SURGERY Left    cataract removed   FINGER ARTHRODESIS Left 10/02/2017   Procedure: LEFT LONG FINGER ARTHRODESIS;  Surgeon: Milly Jakob, MD;  Location: [facility];  Service: Orthopedics;  Laterality: Left;   FOOT SURGERY Bilateral    FRACTURE SURGERY     broken ankle   MEDIAL PARTIAL KNEE REPLACEMENT Left    done at [facility] Right 12/28/2014   Procedure: REVERSE SHOULDER ARTHROPLASTY;  Surgeon: Nita Sells, MD;  Location: [facility];  Service: Orthopedics;  Laterality: Right;  Right reverse total shoulder arthroplasty   REVERSE TOTAL SHOULDER ARTHROPLASTY Right  12/28/2014   DR CHANDLER   rotator cufff     THYROIDECTOMY, PARTIAL     one lobe removed   VARICOSE VEIN SURGERY Right     There were no vitals filed for this visit.    Subjective Assessment - 08/19/22 1104     Subjective I want to make sure that everything is going well.    Pertinent History Liposarcoma of the Rt thigh with sartorious removed 11.5 cm intramuscular mass. Radical resection performed 04/15/19. Finished radiation 08/12/2019. Most recent follow up show continued NED and no METS in October of 2022.    Patient Stated Goals just check in    Currently in Pain? No/denies                [facility] PT Assessment - 08/19/22 0001       Assessment   Medical Diagnosis Rt liposarcoma    Referring Provider (PT) Dr. Gery Pray    Onset Date/Surgical Date 04/15/19    Hand Dominance Right      Precautions   Precaution Comments Lymphedema      Restrictions   Weight Bearing Restrictions No      Home Environment   Living  Environment Private residence    Living Arrangements Spouse/significant other    Home Access Level entry    [place] One level      Prior Function   Level of Independence Independent    Vocation Retired    Leisure walking her dog.  I tried to do the pool but it made the leg worse       Cognition   Overall Cognitive Status Within Functional Limits for tasks assessed      Observation/Other Assessments   Observations able to wear normal pants now - wearing compresison - continued lymphedema present and normal scar tissue.  Collection at the ankle but on both sides.    Skin Integrity fibrotic skin changes due to the medial R thigh                LYMPHEDEMA/ONCOLOGY QUESTIONNAIRE - 08/19/22 0001       Surgeries   Other Surgery Date 04/15/19    Number Lymph Nodes Removed 0      Treatment   Active Chemotherapy Treatment No    Past Chemotherapy Treatment No    Active Radiation Treatment No    Past Radiation Treatment Yes    Date 08/12/19     Body Site R posterior/medial thigh     Current Hormone Treatment No    Past Hormone Therapy No      What other symptoms do you have   Are you Having Heaviness or Tightness Yes    Is it Hard or Difficult finding clothes that fit Yes    Is there Decreased scar mobility Yes      Right Lower Extremity Lymphedema   20 cm Proximal to Suprapatella 54.5 cm    10 cm Proximal to Suprapatella 42 cm    At Midpatella/Popliteal Crease 39 cm    30 cm Proximal to Floor at Lateral Plantar Foot 37.5 cm    20 cm Proximal to Floor at Lateral Plantar Foot 30.'5 1    10 '$ cm Proximal to Floor at Lateral Malleoli 26.3 cm    5 cm Proximal to 1st MTP Joint 21.5 cm    Across MTP Joint 21.7 cm    Around Proximal Great Toe 7.8 cm                Flowsheet Row Outpatient Re[facility] from [facility]  Lymphedema Life Impact Scale Total Score 25 %       Objective measurements completed on examination: See above findings.            PT Short Term Goals - 08/19/22 1140       PT SHORT TERM GOAL #1   Title Pt will demonstrate knowledge of skin care, compression, exercise, and use of pump to maintain edema status.    Status Achieved               PT Long Term Goals - 10/09/21 1323       PT LONG TERM GOAL #1   Title Pt will be ind with compression pump and/or self MLD for Rt LE lymphedema    Status Achieved      PT LONG TERM GOAL #2   Title Pt will be ind with self bandaging or use of compression for the LE    Status Achieved                    Plan - 08/19/22 1130     Clinical Impression Statement  Pt returns to check in regarding measurements and self care.  Pt has increased a bit in size from last visit but is about the size of the visit prior to this.  Pt reports she tries to wear the velcro as much as possible but today has only been wearing the tg soft this morning which may be the reason for the increase today.  Pt is still  doing well with day and night garments and flexitouch.    Personal Factors and Comorbidities Age;Fitness;Behavior Pattern;Comorbidity 3+;Social Background    Comorbidities surgical resection, radiation, OA causing knee pain, priorities    Stability/Clinical Decision Making Stable/Uncomplicated    PT Frequency One time visit    PT Treatment/Interventions ADLs/Self Care Home Management    Consulted and Agree with Plan of Care Patient             Patient will benefit from skilled therapeutic intervention in order to improve the following deficits and impairments:  Decreased strength, Increased edema  Visit Diagnosis: Lymphedema  Other abnormalities of gait and mobility  Muscle weakness (generalized)     Problem List Patient Active Problem List   Diagnosis Date Noted   Abnormal involuntary movement 12/26/2020   Body mass index (BMI) 27.0-27.9, adult 12/26/2020   Chronic anxiety 12/26/2020   Constipation 12/26/2020   Easy bruising 12/26/2020   Edema of lower extremity 12/26/2020   Localized swelling, mass and lump, lower limb 12/26/2020   Encounter for general adult medical examination with abnormal findings 12/26/2020   Family history of breast cancer gene mutation in first degree relative 12/26/2020   Fatty liver 12/26/2020   Hearing loss 12/26/2020   History of thrombophlebitis 12/26/2020   Hyponatremia 12/26/2020   Muscle cramps 12/26/2020   Osteoarthritis 12/26/2020   Osteopenia 12/26/2020   Personal history of other malignant neoplasm of skin 12/26/2020   Postoperative hypothyroidism 12/26/2020   Raynaud's disease 12/26/2020   Lymphedema of right lower extremity 10/05/2020   Essential tremor 10/05/2019   Near syncope 04/28/2019   Adjustment disorder with mixed anxiety and depressed mood 04/28/2019   Liposarcoma of right thigh ([place]) 04/28/2019   Osteoarthritis of right knee 03/10/2019   Hip pain, bilateral 01/07/2018   Sinus tarsi syndrome 08/20/2016   Right  knee pain 07/17/2016   S/p reverse total shoulder arthroplasty 12/28/2014   Abnormality of gait 04/25/2014   Foot pain 06/03/2011   Metatarsalgia 06/03/2011   Chronic pain syndrome 12/05/2009   Chronic insomnia 08/15/2009   INGROWN TOENAIL 06/06/2009   LEG PAIN, BILATERAL 06/05/2009   OTHER VITAMIN B12 DEFICIENCY ANEMIA 04/04/2009   Megaloblastic anemia due to vitamin B12 deficiency 04/04/2009   MALAISE AND FATIGUE 02/28/2009   Hypothyroidism 07/26/2008   Hyperlipidemia 11/05/2007   NEUROPATHY, IDIOPATHIC PERIPHERAL NEC 09/16/2007   Essential hypertension 01/28/2007   Arthritis of hand, degenerative 01/28/2007   ROTATOR CUFF INJURY, RIGHT SHOULDER 01/28/2007   OSTEOPENIA 01/28/2007   Disorder of bone and cartilage 01/28/2007    Stark Bray, PT 08/19/2022, 11:42 AM  [facility] @ [place], [place], [address] Phone: [phone]   Fax:  [phone]  Name: Destiny [name] MRN: [id number] Date of Birth: 1941/02/16

## 2022-08-29 DIAGNOSIS — Z23 Encounter for immunization: Secondary | ICD-10-CM | POA: Diagnosis not present

## 2022-09-09 ENCOUNTER — Other Ambulatory Visit: Payer: Self-pay | Admitting: Family Medicine

## 2022-09-11 ENCOUNTER — Other Ambulatory Visit: Payer: Self-pay | Admitting: Family Medicine

## 2022-09-25 ENCOUNTER — Other Ambulatory Visit: Payer: Self-pay | Admitting: Family Medicine

## 2022-09-29 ENCOUNTER — Ambulatory Visit (INDEPENDENT_AMBULATORY_CARE_PROVIDER_SITE_OTHER): Payer: Medicare Other | Admitting: Family Medicine

## 2022-09-29 ENCOUNTER — Encounter: Payer: Self-pay | Admitting: Family Medicine

## 2022-09-29 VITALS — BP 118/60 | HR 67 | Temp 97.6°F | Ht 61.0 in | Wt 161.6 lb

## 2022-09-29 DIAGNOSIS — E871 Hypo-osmolality and hyponatremia: Secondary | ICD-10-CM

## 2022-09-29 DIAGNOSIS — E039 Hypothyroidism, unspecified: Secondary | ICD-10-CM

## 2022-09-29 DIAGNOSIS — I10 Essential (primary) hypertension: Secondary | ICD-10-CM

## 2022-09-29 LAB — COMPREHENSIVE METABOLIC PANEL
ALT: 16 U/L (ref 0–35)
AST: 23 U/L (ref 0–37)
Albumin: 4.5 g/dL (ref 3.5–5.2)
Alkaline Phosphatase: 55 U/L (ref 39–117)
BUN: 28 mg/dL — ABNORMAL HIGH (ref 6–23)
CO2: 28 mEq/L (ref 19–32)
Calcium: 9.2 mg/dL (ref 8.4–10.5)
Chloride: 94 mEq/L — ABNORMAL LOW (ref 96–112)
Creatinine, Ser: 0.93 mg/dL (ref 0.40–1.20)
GFR: 56.77 mL/min — ABNORMAL LOW (ref >60.00)
Glucose, Bld: 105 mg/dL — ABNORMAL HIGH (ref 70–99)
Potassium: 4.9 mEq/L (ref 3.5–5.1)
Sodium: 130 mEq/L — ABNORMAL LOW (ref 135–145)
Total Bilirubin: 0.3 mg/dL (ref 0.2–1.2)
Total Protein: 7.1 g/dL (ref 6.0–8.3)

## 2022-09-29 LAB — TSH: TSH: 1 u[IU]/mL (ref 0.35–5.50)

## 2022-09-29 NOTE — Progress Notes (Signed)
Established Patient Office Visit  Subjective   Patient ID: Destiny [name], female    DOB: Dec 17, 1940  Age: 81 y.o. MRN: [id number]  Chief Complaint  Patient presents with   Follow-up    HPI   [name] is seen for medical follow-up.  She has history of hypertension, fatty liver, hypothyroidism, essential tremor, osteoarthritis, chronic insomnia, history of liposarcoma right thigh, chronic mild hyponatremia  Her blood pressures been challenging to control.  We have tried previously increasing her amlodipine to 10 mg daily but she had edema issues.  She has chronic hyponatremia and we are avoiding thiazides for that reason.  She is currently on amlodipine 5 mg daily, metoprolol succinate 50 mg twice daily, Benicar 40 mg daily, and recent addition of hydralazine 25 mg twice daily.  Blood pressures have been improved since then.  No consistent lightheadedness or dizziness.  She has hypothyroidism and is on replacement with levothyroxine 137 mcg daily.  Needs follow-up  Past Medical History:  Diagnosis Date   Anxiety    Arthritis    Colon polyps    Complication of anesthesia    BP drops with fentyl and versed   Fatty liver    Hepatic lesion    Hepatitis    hep A  1976   History of hepatitis A    1976   History of radiation therapy    Right thigh- 07/24/19-08/12/19- Dr. Gery Pray   Hypertension    Hypothyroidism    Internal hemorrhoids    Liposarcoma of right thigh ([place]) 03/2019   Past Surgical History:  Procedure Laterality Date   ABDOMINAL HYSTERECTOMY     APPENDECTOMY     BACK SURGERY      fusion   CESAREAN SECTION     times 2   COLONOSCOPY     EYE SURGERY Left    cataract removed   FINGER ARTHRODESIS Left 10/02/2017   Procedure: LEFT LONG FINGER ARTHRODESIS;  Surgeon: Milly Jakob, MD;  Location: [facility];  Service: Orthopedics;  Laterality: Left;   FOOT SURGERY Bilateral    FRACTURE SURGERY     broken ankle   MEDIAL PARTIAL KNEE  REPLACEMENT Left    done at [facility] Right 12/28/2014   Procedure: REVERSE SHOULDER ARTHROPLASTY;  Surgeon: Nita Sells, MD;  Location: [facility];  Service: Orthopedics;  Laterality: Right;  Right reverse total shoulder arthroplasty   REVERSE TOTAL SHOULDER ARTHROPLASTY Right 12/28/2014   DR CHANDLER   rotator cufff     THYROIDECTOMY, PARTIAL     one lobe removed   VARICOSE VEIN SURGERY Right     reports that she has never smoked. She has never used smokeless tobacco. She reports current alcohol use. She reports that she does not use drugs. family history includes Atrial fibrillation in her mother; Cancer - Other in her father; Heart disease in her mother; Pancreatic cancer in her father and maternal uncle. Allergies  Allergen Reactions   Nsaids Other (See Comments)     BRUISES EASILY Other Reaction: BRUISES EASILY Other Reaction: BRUISES EASILY    Oxycodone-Acetaminophen Other (See Comments)     VERY CONSTIPATING, does not provide pain relief   Aspirin Other (See Comments)    Bruising    Augmentin [Amoxicillin-Pot Clavulanate] Other (See Comments)    headache   Ciprocin-Fluocin-Procin [Fluocinolone]    Ciprofloxacin Hcl     tendonitis   Fentanyl Other (See Comments)    Does not work  Fluclorolone Other (See Comments)   Lyrica [Pregabalin] Other (See Comments)    dysphoria   Oxycodone Other (See Comments)    Does not work   Versed [employer] Other (See Comments)    Does not work   Diclofenac Sodium Other (See Comments) and Rash    Skin peeling. Voltaren gel-rash and skin peeling    Hydromorphone Hcl Itching     Itching with large doses - able to tolerate lower doses or shorter duration   Voltaren Gel [Diclofenac Sodium] Rash and Other (See Comments)    Skin peeling.    Review of Systems  Constitutional:  Negative for malaise/fatigue.  Eyes:  Negative for blurred vision.  Respiratory:  Negative for shortness of breath.    Cardiovascular:  Negative for chest pain.  Gastrointestinal:  Negative for abdominal pain.  Neurological:  Negative for dizziness, weakness and headaches.      Objective:     BP 118/60 (BP Location: Left Arm, Patient Position: Sitting, Cuff Size: Normal)   Pulse 67   Temp 97.6 F (36.4 C) (Oral)   Ht '5\' 1"'$  (1.549 m)   Wt 161 lb 9.6 oz (73.3 kg)   SpO2 98%   BMI 30.53 kg/m    Physical Exam Vitals reviewed.  Constitutional:      Appearance: She is well-developed.  Eyes:     Pupils: Pupils are equal, round, and reactive to light.  Neck:     Thyroid: No thyromegaly.     Vascular: No JVD.  Cardiovascular:     Rate and Rhythm: Normal rate and regular rhythm.     Heart sounds:     No gallop.  Pulmonary:     Effort: Pulmonary effort is normal. No respiratory distress.     Breath sounds: Normal breath sounds. No wheezing or rales.  Musculoskeletal:     Cervical back: Neck supple.     Comments: She has a wrap on her right lower extremity.  Left leg reveals no edema.  Neurological:     Mental Status: She is alert.      No results found for any visits on 09/29/22.    The ASCVD Risk score (Arnett DK, et al., 2019) failed to calculate for the following reasons:   The 2019 ASCVD risk score is only valid for ages 53 to 44    Assessment & Plan:   #1 hypertension improved with regimen as above including amlodipine, metoprolol succinate, Benicar, and hydralazine.  Continue current regimen  #2 history of mild hyponatremia.  Avoid thiazides.  Recheck comprehensive metabolic panel  #3 hypothyroidism.  Patient on replacement.  Recheck TSH.   Return in about 6 months (around 03/30/2023).    Destiny Littler, MD

## 2022-09-30 DIAGNOSIS — H524 Presbyopia: Secondary | ICD-10-CM | POA: Diagnosis not present

## 2022-09-30 DIAGNOSIS — H40013 Open angle with borderline findings, low risk, bilateral: Secondary | ICD-10-CM | POA: Diagnosis not present

## 2022-10-01 DIAGNOSIS — R350 Frequency of micturition: Secondary | ICD-10-CM | POA: Diagnosis not present

## 2022-10-09 ENCOUNTER — Other Ambulatory Visit: Payer: Self-pay | Admitting: Family Medicine

## 2022-10-20 DIAGNOSIS — I788 Other diseases of capillaries: Secondary | ICD-10-CM | POA: Diagnosis not present

## 2022-10-20 DIAGNOSIS — L821 Other seborrheic keratosis: Secondary | ICD-10-CM | POA: Diagnosis not present

## 2022-10-20 DIAGNOSIS — C44619 Basal cell carcinoma of skin of left upper limb, including shoulder: Secondary | ICD-10-CM | POA: Diagnosis not present

## 2022-10-20 DIAGNOSIS — Z85828 Personal history of other malignant neoplasm of skin: Secondary | ICD-10-CM | POA: Diagnosis not present

## 2022-10-20 DIAGNOSIS — C44319 Basal cell carcinoma of skin of other parts of face: Secondary | ICD-10-CM | POA: Diagnosis not present

## 2022-10-22 NOTE — Progress Notes (Signed)
Radiation Oncology         ([phone]) [phone] ________________________________  Name: Destiny [name] MRN: [id number]  Date: 10/23/2022  DOB: 1941-04-14  Follow-Up Visit Note  CC: Eulas Post, MD  Gus Puma, MD  No diagnosis found.  Diagnosis: Stage IIIB (pT3, cN0) Liposarcoma of the Right Thigh, Grade 3    Interval Since Last Radiation: 3 years, 2 months, and 12 days   Radiation Treatment Dates: 06/27/2019 through 08/12/2019 Site Technique Total Dose Dose per Fx Completed Fx Beam Energies  Extremity: Ext_Rt 3D 50/50 2 25/25 6X, 10X  Extremity: Ext_Rt_Bst 3D 16/16 2 8/8 6X   Narrative:  The patient returns today for routine 6 month follow-up, she was last seen here for follow-up on 05/08/22. Since her last visit, the patient followed up with Dr. Redmond Pulling ([place]), on 06/17/22. During which time, the patient was noted to have persistent lymphedema involving the right LE, for which she wears a compression stocking. She otherwise denied any other concerns and was noted as NED. Repeat chest x-ray performed that date showed now new or suspicious findings. A new right basilar density was noted, though this is likely reflective of atelectasis versus aspiration.         Other pertinent imaging performed in the interval includes a chest x-ray on 07/08/22 which showed a stable small right pleural effusion. To review, this was first demonstrated on a CT AP this past May.               ***           Allergies:  is allergic to nsaids, oxycodone-acetaminophen, aspirin, augmentin [amoxicillin-pot clavulanate], ciprocin-fluocin-procin [fluocinolone], ciprofloxacin hcl, fentanyl, fluclorolone, lyrica [pregabalin], oxycodone, versed [midazolam], diclofenac sodium, hydromorphone hcl, and voltaren gel [diclofenac sodium].  Meds: Current Outpatient Medications  Medication Sig Dispense Refill   acetaminophen (TYLENOL) 500 MG tablet Take 500 mg by mouth every 4 (four) hours as  needed.     amLODipine (NORVASC) 5 MG tablet TAKE 1 TABLET BY MOUTH DAILY. 90 tablet 1   b complex vitamins capsule Take 1 capsule by mouth daily.     Calcium Carb-Cholecalciferol 600-200 MG-UNIT TABS Take 1 tablet by mouth daily.      cephALEXin (KEFLEX) 250 MG capsule Take 250 mg by mouth daily.     cholecalciferol (VITAMIN D) 1000 UNITS tablet Take 2,000 Units by mouth daily.     gabapentin (NEURONTIN) 300 MG capsule Take 600-900 mg by mouth See admin instructions. Take 300 mg every morning and900 mg at bedtime     hydrALAZINE (APRESOLINE) 25 MG tablet TAKE ONE TABLET BY MOUTH TWICE DAILY 180 tablet 1   HYDROcodone-acetaminophen (NORCO/VICODIN) 5-325 MG tablet Take 1 tablet by mouth every 4 (four) hours as needed for moderate pain or severe pain.      ipratropium (ATROVENT) 0.06 % nasal spray PLACE 2 SPRAYS IN EACH NOSTRIL 3 TIMES A DAY 15 mL 1   levothyroxine (SYNTHROID) 137 MCG tablet TAKE 1 TABLET (137 MCG TOTAL) BY MOUTH DAILY BEFORE BREAKFAST. 90 tablet 0   loratadine-pseudoephedrine (CLARITIN-D 12-HOUR) 5-120 MG per tablet Take 1 tablet by mouth daily.     methocarbamol (ROBAXIN) 750 MG tablet Take 750 mg by mouth 2 (two) times daily as needed for muscle spasms.     metoprolol succinate (TOPROL-XL) 50 MG 24 hr tablet TAKE 1 TABLET BY MOUTH 2 TIMES DAILY. 180 tablet 1   MYRBETRIQ 50 MG TB24 tablet Take 50 mg by mouth daily.  olmesartan (BENICAR) 40 MG tablet TAKE 1 TABLET BY MOUTH DAILY. 90 tablet 1   polyethylene glycol (MIRALAX) 17 g packet Take 17 g by mouth daily. 14 each 0   Soybean-Soy Isoflavones (SOY BALANCE PO) Take 1 tablet by mouth daily. Soy bean extract     zolpidem (AMBIEN) 5 MG tablet TAKE 1 TABLET BY MOUTH ONCE DAILY AT BEDTIME 90 tablet 1   No current facility-administered medications for this encounter.    Physical Findings: The patient is in no acute distress. Patient is alert and oriented.  vitals were not taken for this visit. .  No significant changes. Lungs  are clear to auscultation bilaterally. Heart has regular rate and rhythm. No palpable cervical, supraclavicular, or axillary adenopathy. Abdomen soft, non-tender, normal bowel sounds.   Lab Findings: Lab Results  Component Value Date   WBC 13.1 (H) 04/15/2022   HGB 12.1 04/15/2022   HCT 35.6 (L) 04/15/2022   MCV 100.3 (H) 04/15/2022   PLT 344 04/15/2022    Radiographic Findings: No results found.  Impression:  Stage IIIB (pT3, cN0) Liposarcoma of the Right Thigh, Grade 3    The patient is recovering from the effects of radiation.  ***  Plan:  ***   *** minutes of total time was spent for this patient encounter, including preparation, face-to-face counseling with the patient and coordination of care, physical exam, and documentation of the encounter. ____________________________________  Blair Promise, PhD, MD  This document serves as a record of services personally performed by Gery Pray, MD. It was created on his behalf by Roney Mans, a trained medical scribe. The creation of this record is based on the scribe's personal observations and the provider's statements to them. This document has been checked and approved by the attending provider.

## 2022-10-23 ENCOUNTER — Ambulatory Visit
Admission: RE | Admit: 2022-10-23 | Discharge: 2022-10-23 | Disposition: A | Discharge status: Home / Self Care | Payer: Medicare Other | Source: Ambulatory Visit | Attending: Radiation Oncology | Admitting: Radiation Oncology

## 2022-10-23 VITALS — BP 120/45 | HR 65 | Temp 97.5°F | Resp 20 | Ht 61.0 in | Wt 159.4 lb

## 2022-10-23 DIAGNOSIS — Z8589 Personal history of malignant neoplasm of other organs and systems: Secondary | ICD-10-CM | POA: Diagnosis not present

## 2022-10-23 DIAGNOSIS — C4921 Malignant neoplasm of connective and soft tissue of right lower limb, including hip: Secondary | ICD-10-CM | POA: Diagnosis not present

## 2022-10-23 DIAGNOSIS — R609 Edema, unspecified: Secondary | ICD-10-CM | POA: Diagnosis not present

## 2022-10-23 DIAGNOSIS — Z79899 Other long term (current) drug therapy: Secondary | ICD-10-CM | POA: Insufficient documentation

## 2022-10-23 DIAGNOSIS — Z79890 Hormone replacement therapy (postmenopausal): Secondary | ICD-10-CM | POA: Diagnosis not present

## 2022-10-23 DIAGNOSIS — Z923 Personal history of irradiation: Secondary | ICD-10-CM | POA: Insufficient documentation

## 2022-10-23 NOTE — Progress Notes (Addendum)
Destiny [name] is here to follow up after treatment to her right thigh.  She denies having any pain.  She reports having lymphedema in her lower leg and uses compression socks and also has lymphedema machine. She also reports that she has trouble bending her knee so she has to be careful walking.  She saw Dr. Redmond Pulling last on 06/17/2022.  She is wondering if an X-Ray and MRI will be ordered.  She would like to have the MRI here if possible.  BP (!) 120/45 (BP Location: Left Arm, Patient Position: Sitting, Cuff Size: Large)   Pulse 65   Temp (!) 97.5 F (36.4 C)   Resp 20   Ht '5\' 1"'$  (1.549 m)   Wt 159 lb 6.4 oz (72.3 kg)   SpO2 100%   BMI 30.12 kg/m

## 2022-10-28 ENCOUNTER — Telehealth: Payer: Self-pay | Admitting: *Deleted

## 2022-10-28 NOTE — Telephone Encounter (Signed)
Called patient to inform of MRI for 11-06-22 - arrival time- 12:30 pm @ [facility], no restrictions to test, patient to have chest x-ray after MRI

## 2022-10-28 NOTE — Telephone Encounter (Signed)
CALLED PATIENT TO INFORM OF MRI FOR 11-06-22- ARRIVAL TIME- 12:30 PM @ [facility], NO RESTRICTIONS TO TEST, CHEST X-RAY TO FOLLOW MRI, PATIENT TO SEE DR. KINARD FOR RESULTS ON 10-31-22 @ 11AM , LVM FOR A RETURN CALL

## 2022-10-29 DIAGNOSIS — G894 Chronic pain syndrome: Secondary | ICD-10-CM | POA: Diagnosis not present

## 2022-10-29 DIAGNOSIS — M79604 Pain in right leg: Secondary | ICD-10-CM | POA: Diagnosis not present

## 2022-10-29 DIAGNOSIS — M25551 Pain in right hip: Secondary | ICD-10-CM | POA: Diagnosis not present

## 2022-10-29 DIAGNOSIS — Z79891 Long term (current) use of opiate analgesic: Secondary | ICD-10-CM | POA: Diagnosis not present

## 2022-11-05 ENCOUNTER — Other Ambulatory Visit: Payer: Self-pay | Admitting: Family Medicine

## 2022-11-06 ENCOUNTER — Ambulatory Visit (HOSPITAL_COMMUNITY)
Admission: RE | Admit: 2022-11-06 | Discharge: 2022-11-06 | Disposition: A | Discharge status: Home / Self Care | Payer: Medicare Other | Source: Ambulatory Visit | Attending: Radiation Oncology | Admitting: Radiation Oncology

## 2022-11-06 DIAGNOSIS — Z9889 Other specified postprocedural states: Secondary | ICD-10-CM | POA: Diagnosis not present

## 2022-11-06 DIAGNOSIS — R600 Localized edema: Secondary | ICD-10-CM | POA: Diagnosis not present

## 2022-11-06 DIAGNOSIS — C4921 Malignant neoplasm of connective and soft tissue of right lower limb, including hip: Secondary | ICD-10-CM

## 2022-11-06 DIAGNOSIS — M25461 Effusion, right knee: Secondary | ICD-10-CM | POA: Diagnosis not present

## 2022-11-06 DIAGNOSIS — J90 Pleural effusion, not elsewhere classified: Secondary | ICD-10-CM | POA: Diagnosis not present

## 2022-11-06 MED ORDER — GADOBUTROL 1 MMOL/ML IV SOLN
7.0000 mL | Freq: Once | INTRAVENOUS | Status: AC | PRN
  Administered 2022-11-06: 7 mL via INTRAVENOUS

## 2022-11-12 ENCOUNTER — Telehealth: Payer: Self-pay

## 2022-11-12 NOTE — Telephone Encounter (Signed)
Patient called in requesting a telephone follow up with Dr. Sondra Come to discuss recent scan  results.  Current phone number [phone]

## 2022-11-13 NOTE — Telephone Encounter (Signed)
Called placed to patient to make aware of appointment with Dr. Magda Bernheim on 12/02/22 @ 4pm. Patient voiced understanding.

## 2022-12-09 ENCOUNTER — Other Ambulatory Visit: Payer: Self-pay | Admitting: Family Medicine

## 2022-12-16 ENCOUNTER — Telehealth: Payer: Self-pay

## 2022-12-16 NOTE — Telephone Encounter (Signed)
Patient called with questions about MRI that was performed on 11/06/22. Patient states, " I think I need aspiration of fluid to liposarcoma and have fluid tested." Please advise

## 2022-12-18 ENCOUNTER — Other Ambulatory Visit: Payer: Self-pay | Admitting: Family Medicine

## 2022-12-23 ENCOUNTER — Ambulatory Visit (INDEPENDENT_AMBULATORY_CARE_PROVIDER_SITE_OTHER): Payer: Medicare Other

## 2022-12-23 ENCOUNTER — Other Ambulatory Visit: Payer: Self-pay | Admitting: Radiation Oncology

## 2022-12-23 ENCOUNTER — Telehealth: Payer: Self-pay | Admitting: Radiation Oncology

## 2022-12-23 VITALS — Ht 61.0 in | Wt 161.0 lb

## 2022-12-23 DIAGNOSIS — C4921 Malignant neoplasm of connective and soft tissue of right lower limb, including hip: Secondary | ICD-10-CM

## 2022-12-23 DIAGNOSIS — Z0000 Encounter for general adult medical examination without abnormal findings: Secondary | ICD-10-CM | POA: Diagnosis not present

## 2022-12-23 NOTE — Telephone Encounter (Signed)
Earlier today I spoke with Dr. Magda Bernheim, oncologic surgeon who performed the patient's surgery.  We had forwarded images of our most recent MRI for his review.  These were compared to the patient's serial MRI's at [facility].  This did not show any change and therefore is not concerning for recurrence.  I discussed this with the patient and she is relieved.  She will return for follow-up in 6 months.  Prior to this follow-up appointment she will have a repeat MRI.

## 2022-12-23 NOTE — Patient Instructions (Addendum)
[name] , Thank you for taking time to come for your Medicare Wellness Visit. I appreciate your ongoing commitment to your health goals. Please review the following plan we discussed and let me know if I can assist you in the future.   These are the goals we discussed:  Goals       Patient Stated      I would like to continue.       Stay alive (pt-stated)      transition to [place] goes well        This is a list of the screening recommended for you and due dates:  Health Maintenance  Topic Date Due   COVID-19 Vaccine (5 - 2023-24 season) 01/08/2023*   Zoster (Shingles) Vaccine (1 of 2) 03/24/2023*   DEXA scan (bone density measurement)  12/24/2023*   DTaP/Tdap/Td vaccine (3 - Td or Tdap) 06/29/2023   Medicare Annual Wellness Visit  12/24/2023   Pneumonia Vaccine  Completed   Flu Shot  Completed   HPV Vaccine  Aged Out  *Topic was postponed. The date shown is not the original due date.  Opioid Pain Medicine Management Opioids are powerful medicines that are used to treat moderate to severe pain. When used for short periods of time, they can help you to: Sleep better. Do better in physical or occupational therapy. Feel better in the first few days after an injury. Recover from surgery. Opioids should be taken with the supervision of a trained health care provider. They should be taken for the shortest period of time possible. This is because opioids can be addictive, and the longer you take opioids, the greater your risk of addiction. This addiction can also be called opioid use disorder. What are the risks? Using opioid pain medicines for longer than 3 days increases your risk of side effects. Side effects include: Constipation. Nausea and vomiting. Breathing difficulties (respiratory depression). Drowsiness. Confusion. Opioid use disorder. Itching. Taking opioid pain medicine for a long period of time can affect your ability to do daily tasks. It also puts you at  risk for: Motor vehicle crashes. Depression. Suicide. Heart attack. Overdose, which can be life-threatening. What is a pain treatment plan? A pain treatment plan is an agreement between you and your health care provider. Pain is unique to each person, and treatments vary depending on your condition. To manage your pain, you and your health care provider need to work together. To help you do this: Discuss the goals of your treatment, including how much pain you might expect to have and how you will manage the pain. Review the risks and benefits of taking opioid medicines. Remember that a good treatment plan uses more than one approach and minimizes the chance of side effects. Be honest about the amount of medicines you take and about any drug or alcohol use. Get pain medicine prescriptions from only one health care provider. Pain can be managed with many types of alternative treatments. Ask your health care provider to refer you to one or more specialists who can help you manage pain through: Physical or occupational therapy. Counseling (cognitive behavioral therapy). Good nutrition. Biofeedback. Massage. Meditation. Non-opioid medicine. Following a gentle exercise program. How to use opioid pain medicine Taking medicine Take your pain medicine exactly as told by your health care provider. Take it only when you need it. If your pain gets less severe, you may take less than your prescribed dose if your health care provider approves. If you are  not having pain, do nottake pain medicine unless your health care provider tells you to take it. If your pain is severe, do nottry to treat it yourself by taking more pills than instructed on your prescription. Contact your health care provider for help. Write down the times when you take your pain medicine. It is easy to become confused while on pain medicine. Writing the time can help you avoid overdose. Take other over-the-counter or prescription  medicines only as told by your health care provider. Keeping yourself and others safe  While you are taking opioid pain medicine: Do not drive, use machinery, or power tools. Do not sign legal documents. Do not drink alcohol. Do not take sleeping pills. Do not supervise children by yourself. Do not do activities that require climbing or being in high places. Do not go to a lake, river, ocean, spa, or swimming pool. Do not share your pain medicine with anyone. Keep pain medicine in a locked cabinet or in a secure area where pets and children cannot reach it. Stopping your use of opioids If you have been taking opioid medicine for more than a few weeks, you may need to slowly decrease (taper) how much you take until you stop completely. Tapering your use of opioids can decrease your risk of symptoms of withdrawal, such as: Pain and cramping in the abdomen. Nausea. Sweating. Sleepiness. Restlessness. Uncontrollable shaking (tremors). Cravings for the medicine. Do not attempt to taper your use of opioids on your own. Talk with your health care provider about how to do this. Your health care provider may prescribe a step-down schedule based on how much medicine you are taking and how long you have been taking it. Getting rid of leftover pills Do not save any leftover pills. Get rid of leftover pills safely by: Taking the medicine to a prescription take-back program. This is usually offered by the county or law enforcement. Bringing them to a pharmacy that has a drug disposal container. Flushing them down the toilet. Check the label or package insert of your medicine to see whether this is safe to do. Throwing them out in the trash. Check the label or package insert of your medicine to see whether this is safe to do. If it is safe to throw it out, remove the medicine from the original container, put it into a sealable bag or container, and mix it with used coffee grounds, food scraps, dirt, or  cat litter before putting it in the trash. Follow these instructions at home: Activity Do exercises as told by your health care provider. Avoid activities that make your pain worse. Return to your normal activities as told by your health care provider. Ask your health care provider what activities are safe for you. General instructions You may need to take these actions to prevent or treat constipation: Drink enough fluid to keep your urine pale yellow. Take over-the-counter or prescription medicines. Eat foods that are high in fiber, such as beans, whole grains, and fresh fruits and vegetables. Limit foods that are high in fat and processed sugars, such as fried or sweet foods. Keep all follow-up visits. This is important. Where to find support If you have been taking opioids for a long time, you may benefit from receiving support for quitting from a local support group or counselor. Ask your health care provider for a referral to these resources in your area. Where to find more information Centers for Disease Control and Prevention (CDC): http://www.wolf.info/ U.[place]. Food and Drug  Administration (FDA): [place] Get help right away if: You may have taken too much of an opioid (overdosed). Common symptoms of an overdose: Your breathing is slower or more shallow than normal. You have a very slow heartbeat (pulse). You have slurred speech. You have nausea and vomiting. Your pupils become very small. You have other potential symptoms: You are very confused. You faint or feel like you will faint. You have cold, clammy skin. You have blue lips or fingernails. You have thoughts of harming yourself or harming others. These symptoms may represent a serious problem that is an emergency. Do not wait to see if the symptoms will go away. Get medical help right away. Call your local emergency services (911 in the [place] Do not drive yourself to the hospital.  If you ever feel like you may hurt yourself or  others, or have thoughts about taking your own life, get help right away. Go to your nearest emergency department or: Call your local emergency services (911 in the [place] Call the [facility] 959-479-1575 in the [place] Call a suicide crisis helpline, such as the [facility] at 934 708 7358 or 988 in the [place]. This is open 24 hours a day in the [place] Text the Crisis Text Line at 814-242-7204 (in the [place].). Summary Opioid medicines can help you manage moderate to severe pain for a short period of time. A pain treatment plan is an agreement between you and your health care provider. Discuss the goals of your treatment, including how much pain you might expect to have and how you will manage the pain. If you think that you or someone else may have taken too much of an opioid, get medical help right away. This information is not intended to replace advice given to you by your health care provider. Make sure you discuss any questions you have with your health care provider. Document Revised: 06/05/2021 Document Reviewed: 02/20/2021 Elsevier Patient Education  [place] directives: Please bring a copy of your health care power of attorney and living will to the office to be added to your chart at your convenience.   Conditions/risks identified: None  Next appointment: Follow up in one year for your annual wellness visit     Preventive Care 65 Years and Older, Female Preventive care refers to lifestyle choices and visits with your health care provider that can promote health and wellness. What does preventive care include? A yearly physical exam. This is also called an annual well check. Dental exams once or twice a year. Routine eye exams. Ask your health care provider how often you should have your eyes checked. Personal lifestyle choices, including: Daily care of your teeth and gums. Regular physical activity. Eating a healthy  diet. Avoiding tobacco and drug use. Limiting alcohol use. Practicing safe sex. Taking low-dose aspirin every day. Taking vitamin and mineral supplements as recommended by your health care provider. What happens during an annual well check? The services and screenings done by your health care provider during your annual well check will depend on your age, overall health, lifestyle risk factors, and family history of disease. Counseling  Your health care provider may ask you questions about your: Alcohol use. Tobacco use. Drug use. Emotional well-being. Home and relationship well-being. Sexual activity. Eating habits. History of falls. Memory and ability to understand (cognition). Work and work Statistician. Reproductive health. Screening  You may have the following tests or measurements: Height, weight, and BMI. Blood pressure.  Lipid and cholesterol levels. These may be checked every 5 years, or more frequently if you are over 33 years old. Skin check. Lung cancer screening. You may have this screening every year starting at age 51 if you have a 30-pack-year history of smoking and currently smoke or have quit within the past 15 years. Fecal occult blood test (FOBT) of the stool. You may have this test every year starting at age 25. Flexible sigmoidoscopy or colonoscopy. You may have a sigmoidoscopy every 5 years or a colonoscopy every 10 years starting at age 31. Hepatitis C blood test. Hepatitis B blood test. Sexually transmitted disease (STD) testing. Diabetes screening. This is done by checking your blood sugar (glucose) after you have not eaten for a while (fasting). You may have this done every 1-3 years. Bone density scan. This is done to screen for osteoporosis. You may have this done starting at age 39. Mammogram. This may be done every 1-2 years. Talk to your health care provider about how often you should have regular mammograms. Talk with your health care provider about  your test results, treatment options, and if necessary, the need for more tests. Vaccines  Your health care provider may recommend certain vaccines, such as: Influenza vaccine. This is recommended every year. Tetanus, diphtheria, and acellular pertussis (Tdap, Td) vaccine. You may need a Td booster every 10 years. Zoster vaccine. You may need this after age 68. Pneumococcal 13-valent conjugate (PCV13) vaccine. One dose is recommended after age 40. Pneumococcal polysaccharide (PPSV23) vaccine. One dose is recommended after age 85. Talk to your health care provider about which screenings and vaccines you need and how often you need them. This information is not intended to replace advice given to you by your health care provider. Make sure you discuss any questions you have with your health care provider. Document Released: 12/07/2015 Document Revised: 07/30/2016 Document Reviewed: 09/11/2015 Elsevier Interactive Patient Education  2017 [employer] Prevention in the Home Falls can cause injuries. They can happen to people of all ages. There are many things you can do to make your home safe and to help prevent falls. What can I do on the outside of my home? Regularly fix the edges of walkways and driveways and fix any cracks. Remove anything that might make you trip as you walk through a door, such as a raised step or threshold. Trim any bushes or trees on the path to your home. Use bright outdoor lighting. Clear any walking paths of anything that might make someone trip, such as rocks or tools. Regularly check to see if handrails are loose or broken. Make sure that both sides of any steps have handrails. Any raised decks and porches should have guardrails on the edges. Have any leaves, snow, or ice cleared regularly. Use sand or salt on walking paths during winter. Clean up any spills in your garage right away. This includes oil or grease spills. What can I do in the bathroom? Use  night lights. Install grab bars by the toilet and in the tub and shower. Do not use towel bars as grab bars. Use non-skid mats or decals in the tub or shower. If you need to sit down in the shower, use a plastic, non-slip stool. Keep the floor dry. Clean up any water that spills on the floor as soon as it happens. Remove soap buildup in the tub or shower regularly. Attach bath mats securely with double-sided non-slip rug tape. Do not have throw rugs  and other things on the floor that can make you trip. What can I do in the bedroom? Use night lights. Make sure that you have a light by your bed that is easy to reach. Do not use any sheets or blankets that are too big for your bed. They should not hang down onto the floor. Have a firm chair that has side arms. You can use this for support while you get dressed. Do not have throw rugs and other things on the floor that can make you trip. What can I do in the kitchen? Clean up any spills right away. Avoid walking on wet floors. Keep items that you use a lot in easy-to-reach places. If you need to reach something above you, use a strong step stool that has a grab bar. Keep electrical cords out of the way. Do not use floor polish or wax that makes floors slippery. If you must use wax, use non-skid floor wax. Do not have throw rugs and other things on the floor that can make you trip. What can I do with my stairs? Do not leave any items on the stairs. Make sure that there are handrails on both sides of the stairs and use them. Fix handrails that are broken or loose. Make sure that handrails are as long as the stairways. Check any carpeting to make sure that it is firmly attached to the stairs. Fix any carpet that is loose or worn. Avoid having throw rugs at the top or bottom of the stairs. If you do have throw rugs, attach them to the floor with carpet tape. Make sure that you have a light switch at the top of the stairs and the bottom of the  stairs. If you do not have them, ask someone to add them for you. What else can I do to help prevent falls? Wear shoes that: Do not have high heels. Have rubber bottoms. Are comfortable and fit you well. Are closed at the toe. Do not wear sandals. If you use a stepladder: Make sure that it is fully opened. Do not climb a closed stepladder. Make sure that both sides of the stepladder are locked into place. Ask someone to hold it for you, if possible. Clearly mark and make sure that you can see: Any grab bars or handrails. First and last steps. Where the edge of each step is. Use tools that help you move around (mobility aids) if they are needed. These include: Canes. Walkers. Scooters. Crutches. Turn on the lights when you go into a dark area. Replace any light bulbs as soon as they burn out. Set up your furniture so you have a clear path. Avoid moving your furniture around. If any of your floors are uneven, fix them. If there are any pets around you, be aware of where they are. Review your medicines with your doctor. Some medicines can make you feel dizzy. This can increase your chance of falling. Ask your doctor what other things that you can do to help prevent falls. This information is not intended to replace advice given to you by your health care provider. Make sure you discuss any questions you have with your health care provider. Document Released: 09/06/2009 Document Revised: 04/17/2016 Document Reviewed: 12/15/2014 Elsevier Interactive Patient Education  2017 [employer].

## 2022-12-23 NOTE — Progress Notes (Signed)
Subjective:   Destiny [name] is a 82 y.o. female who presents for Medicare Annual (Subsequent) preventive examination.  Review of Systems  Virtual Visit via Telephone Note  I connected with  Destiny [name] on 12/23/22 at 12:30 PM EST by telephone and verified that I am speaking with the correct person using two identifiers.  Location: Patient: Home Provider: Office Persons participating in the virtual visit: patient/Nurse Health Advisor   I discussed the limitations, risks, security and privacy concerns of performing an evaluation and management service by telephone and the availability of in person appointments. The patient expressed understanding and agreed to proceed.  Interactive audio and video telecommunications were attempted between this nurse and patient, however failed, due to patient having technical difficulties OR patient did not have access to video capability.  We continued and completed visit with audio only.  Some vital signs may be absent or patient reported.   Criselda Peaches, LPN  Cardiac Risk Factors include: advanced age (>7mn, >>61women);hypertension     Objective:    Today's Vitals   12/23/22 1233  Weight: 161 lb (73 kg)  Height: '5\' 1"'$  (1.549 m)   Body mass index is 30.42 kg/m.     12/23/2022   12:41 PM 10/23/2022    9:59 AM 05/08/2022   10:09 AM 04/17/2022    3:07 PM 12/20/2021    1:19 PM 11/19/2021    1:03 PM 11/16/2021    4:20 PM  Advanced Directives  Does Patient Have a Medical Advance Directive? Yes Yes Yes Yes Yes No No  Type of AParamed[place]of [place] will [place] will   HP[place]Living will    Does patient want to make changes to medical advance directive?    No - Patient declined No - Patient declined    Copy of [place] Chart? No - copy requested No - copy requested   No - copy requested    Would patient like information on  creating a medical advance directive?      No - Patient declined     Current Medications (verified) Outpatient Encounter Medications as of 12/23/2022  Medication Sig   acetaminophen (TYLENOL) 500 MG tablet Take 500 mg by mouth every 4 (four) hours as needed.   amLODipine (NORVASC) 5 MG tablet TAKE 1 TABLET BY MOUTH DAILY.   b complex vitamins capsule Take 1 capsule by mouth daily.   Calcium Carb-Cholecalciferol 600-200 MG-UNIT TABS Take 1 tablet by mouth daily.    cephALEXin (KEFLEX) 250 MG capsule Take 250 mg by mouth daily.   cholecalciferol (VITAMIN D) 1000 UNITS tablet Take 2,000 Units by mouth daily.   gabapentin (NEURONTIN) 300 MG capsule Take 600-900 mg by mouth See admin instructions. Take 300 mg every morning and900 mg at bedtime   hydrALAZINE (APRESOLINE) 25 MG tablet TAKE ONE TABLET BY MOUTH TWICE DAILY   HYDROcodone-acetaminophen (NORCO/VICODIN) 5-325 MG tablet Take 1 tablet by mouth every 4 (four) hours as needed for moderate pain or severe pain.    ipratropium (ATROVENT) 0.06 % nasal spray PLACE 2 SPRAYS IN EACH NOSTRIL 3 TIMES A DAY   levothyroxine (SYNTHROID) 137 MCG tablet TAKE 1 TABLET BY MOUTH DAILY BEFORE BREAKFAST.   loratadine-pseudoephedrine (CLARITIN-D 12-HOUR) 5-120 MG per tablet Take 1 tablet by mouth daily.   methocarbamol (ROBAXIN) 750 MG tablet Take 750 mg by mouth 2 (two) times daily as needed for muscle spasms.   metoprolol succinate (  TOPROL-XL) 50 MG 24 hr tablet TAKE 1 TABLET BY MOUTH 2 TIMES DAILY.   MYRBETRIQ 50 MG TB24 tablet Take 50 mg by mouth daily.   olmesartan (BENICAR) 40 MG tablet TAKE 1 TABLET BY MOUTH DAILY.   polyethylene glycol (MIRALAX) 17 g packet Take 17 g by mouth daily.   Soybean-Soy Isoflavones (SOY BALANCE PO) Take 1 tablet by mouth daily. Soy bean extract   zolpidem (AMBIEN) 5 MG tablet TAKE 1 TABLET BY MOUTH ONCE DAILY AT BEDTIME   No facility-administered encounter medications on file as of 12/23/2022.    Allergies  (verified) Nsaids, Oxycodone-acetaminophen, Aspirin, Augmentin [amoxicillin-pot clavulanate], Ciprocin-fluocin-procin [fluocinolone], Ciprofloxacin hcl, Fentanyl, Fluclorolone, Lyrica [pregabalin], Oxycodone, Versed [midazolam], Diclofenac sodium, Hydromorphone hcl, and Voltaren gel [diclofenac sodium]   History: Past Medical History:  Diagnosis Date   Anxiety    Arthritis    Colon polyps    Complication of anesthesia    BP drops with fentyl and versed   Fatty liver    Hepatic lesion    Hepatitis    hep A  1976   History of hepatitis A    1976   History of radiation therapy    Right thigh- 07/24/19-08/12/19- Dr. Gery Pray   Hypertension    Hypothyroidism    Internal hemorrhoids    Liposarcoma of right thigh ([facility]) 03/2019   Past Surgical History:  Procedure Laterality Date   ABDOMINAL HYSTERECTOMY     APPENDECTOMY     BACK SURGERY      fusion   CESAREAN SECTION     times 2   COLONOSCOPY     EYE SURGERY Left    cataract removed   FINGER ARTHRODESIS Left 10/02/2017   Procedure: LEFT LONG FINGER ARTHRODESIS;  Surgeon: Milly Jakob, MD;  Location: [facility];  Service: Orthopedics;  Laterality: Left;   FOOT SURGERY Bilateral    FRACTURE SURGERY     broken ankle   MEDIAL PARTIAL KNEE REPLACEMENT Left    done at [facility] Right 12/28/2014   Procedure: REVERSE SHOULDER ARTHROPLASTY;  Surgeon: Nita Sells, MD;  Location: [facility];  Service: Orthopedics;  Laterality: Right;  Right reverse total shoulder arthroplasty   REVERSE TOTAL SHOULDER ARTHROPLASTY Right 12/28/2014   DR CHANDLER   rotator cufff     THYROIDECTOMY, PARTIAL     one lobe removed   VARICOSE VEIN SURGERY Right    Family History  Problem Relation Age of Onset   Heart disease Mother    Atrial fibrillation Mother    Cancer - Other Father    Pancreatic cancer Father    Pancreatic cancer Maternal Uncle    Colon cancer Neg Hx    Prostate cancer Neg Hx     Rectal cancer Neg Hx    Stomach cancer Neg Hx    Social History   Socioeconomic History   Marital status: Married    Spouse name: Not on file   Number of children: Not on file   Years of education: Not on file   Highest education level: Not on file  Occupational History   Occupation: retired    Comment: Therapist, sports  Tobacco Use   Smoking status: Never   Smokeless tobacco: Never  Substance and Sexual Activity   Alcohol use: Yes    Comment: rarely   Drug use: No   Sexual activity: Not Currently  Other Topics Concern   Not on file  Social History Narrative   Married  [place] 2   Lives in 1 story ranch in [place] and also has mountain home in [place] with stairs   Social Determinants of Health   Financial Resource Strain: Low Risk  (12/23/2022)   Overall Financial Resource Strain (CARDIA)    Difficulty of Paying Living Expenses: Not hard at all  Food Insecurity: No Food Insecurity (12/23/2022)   Hunger Vital Sign    Worried About Running Out of Food in the Last Year: Never true    Ran Out of Food in the Last Year: Never true  Transportation Needs: No Transportation Needs (12/23/2022)   PRAPARE - Hydrologist (Medical): No    Lack of Transportation (Non-Medical): No  Physical Activity: Insufficiently Active (12/23/2022)   Exercise Vital Sign    Days of Exercise per Week: 3 days    Minutes of Exercise per Session: 30 min  Stress: No Stress Concern Present (12/23/2022)   [place]    Feeling of Stress : Not at all  Social Connections: Moderately Isolated (12/23/2022)   Social Connection and Isolation Panel [NHANES]    Frequency of Communication with Friends and Family: More than three times a week    Frequency of Social Gatherings with Friends and Family: More than three times a week    Attends Religious Services: Never    Marine scientist or Organizations: No    Attends Programme researcher, broadcasting/film/video: Never    Marital Status: Married    Tobacco Counseling Counseling given: Not Answered   Clinical Intake:  Pre-visit preparation completed: No  Pain : No/denies pain     BMI - recorded: 30.42 Nutritional Status: BMI > 30  Obese Nutritional Risks: None Diabetes: No  How often do you need to have someone help you when you read instructions, pamphlets, or other written materials from your doctor or pharmacy?: 1 - Never  Diabetic?  No  Interpreter Needed?: No  Information entered by :: Rolene Arbour LPN   Activities of Daily Living    12/23/2022   12:40 PM  In your present state of health, do you have any difficulty performing the following activities:  Hearing? 1  Comment Wears hearing aids  Vision? 0  Difficulty concentrating or making decisions? 0  Walking or climbing stairs? 0  Dressing or bathing? 0  Doing errands, shopping? 0  Preparing Food and eating ? N  Using the Toilet? N  In the past six months, have you accidently leaked urine? N  Do you have problems with loss of bowel control? N  Managing your Medications? N  Managing your Finances? N  Housekeeping or managing your Housekeeping? N    Patient Care Team: Eulas Post, MD as PCP - General (Family Medicine) Viona Gilmore, [facility] (Inactive) as Pharmacist (Pharmacist)  Indicate any recent Medical Services you may have received from other than [facility] providers in the past year (date may be approximate).     Assessment:   This is a routine wellness examination for Destiny [name].  Hearing/Vision screen Hearing Screening - Comments:: Wears hearing Aids Vision Screening - Comments:: Wears rx glasses - up to date with routine eye exams with  Dr Tamala Julian  Dietary issues and exercise activities discussed: Exercise limited by: None identified   Goals Addressed               This Visit's Progress     Stay alive (pt-stated)  Depression Screen    12/23/2022   12:39 PM  12/20/2021    1:07 PM 01/15/2021   10:40 AM 01/09/2020    3:42 PM 10/05/2019    9:17 AM 01/13/2017    2:56 PM 10/08/2016    8:59 AM  PHQ 2/9 Scores  PHQ - 2 Score 0 0 0 1 0 0 0  PHQ- 9 Score     2      Fall Risk    12/23/2022   12:41 PM 12/20/2021    1:10 PM 01/15/2021   10:39 AM 01/09/2020    3:48 PM 01/13/2017    2:56 PM  [place] in the past year? 0 1 0 0 No  Number falls in past yr: 0 0 0    Injury with Fall? 0 1 0    Comment  Fall injury skin tear to left arm.recieved.medical attention     Risk for fall due to : No Fall Risks  No Fall Risks Medication side effect;Orthopedic patient;Other (Comment)   Risk for fall due to: Comment    recent cancer diagnosis in 2020   Follow up Falls prevention discussed  Falls evaluation completed;Falls prevention discussed Falls evaluation completed;Education provided;Falls prevention discussed     FALL RISK PREVENTION PERTAINING TO THE HOME:  Any stairs in or around the home? Yes  If so, are there any without handrails? No  Home free of loose throw rugs in walkways, pet beds, electrical cords, etc? Yes  Adequate lighting in your home to reduce risk of falls? Yes   ASSISTIVE DEVICES UTILIZED TO PREVENT FALLS:  Life alert? No  Use of a cane, walker or w/c? No  Grab bars in the bathroom? Yes  Shower chair or bench in shower? No  Elevated toilet seat or a handicapped toilet? No   TIMED UP AND GO:  Was the test performed? No . Audio Visit   Cognitive Function:        12/23/2022   12:42 PM 12/20/2021    1:17 PM 01/09/2020    3:49 PM  6CIT Screen  What Year? 0 points 0 points 0 points  What month? 0 points 0 points 0 points  What time? 0 points 0 points 0 points  Count back from 20 0 points 0 points 0 points  Months in reverse 0 points 0 points 0 points  Repeat phrase 0 points 0 points 0 points  Total Score 0 points 0 points 0 points    Immunizations Immunization History  Administered Date(s) Administered   Fluad  Quad(high Dose 65+) 07/29/2019, 09/22/2022   Influenza Whole 09/08/2008   Influenza, High Dose Seasonal PF 09/25/2014, 08/09/2015, 08/12/2016   Influenza, Quadrivalent, Recombinant, Inj, Pf 08/19/2017, 08/26/2018   Influenza-Unspecified 08/22/2020, 09/08/2021   Moderna Covid-19 Vaccine Bivalent Booster 56yr & up 08/23/2022   PFIZER SARS-COV-2 Pediatric Vaccination 5-173yr11/11/2019   PFIZER(Purple Top)SARS-COV-2 Vaccination 04/29/2021   Pneumococcal Conjugate-13 06/28/2013   Pneumococcal Polysaccharide-23 11/25/2004   Td 11/25/2004   Tdap 06/28/2013   Zoster, Live 07/15/2011    TDAP status: Up to date  Flu Vaccine status: Up to date  Pneumococcal vaccine status: Up to date  Covid-19 vaccine status: Completed vaccines  Qualifies for Shingles Vaccine? Yes   Zostavax completed No   Shingrix Completed?: No.    Education has been provided regarding the importance of this vaccine. Patient has been advised to call insurance company to determine out of pocket expense if they have not yet received this vaccine.  Advised may also receive vaccine at local pharmacy or Health Dept. Verbalized acceptance and understanding.  Screening Tests Health Maintenance  Topic Date Due   COVID-19 Vaccine (5 - 2023-24 season) 01/08/2023 (Originally 10/24/2022)   Zoster Vaccines- Shingrix (1 of 2) 03/24/2023 (Originally 01/27/1960)   DEXA SCAN  12/24/2023 (Originally 01/28/2004)   DTaP/Tdap/Td (3 - Td or Tdap) 06/29/2023   Medicare Annual Wellness (AWV)  12/24/2023   Pneumonia Vaccine 97+ Years old  Completed   INFLUENZA VACCINE  Completed   HPV VACCINES  Aged Out    Health Maintenance  There are no preventive care reminders to display for this patient.   Colorectal cancer screening: No longer required.   Mammogram status: No longer required due to Age.  Bone Density status: Ordered Patient declined. Pt provided with contact info and advised to call to schedule appt.  Lung Cancer Screening: (Low  Dose CT Chest recommended if Age 70-80 years, 30 pack-year currently smoking OR have quit w/in 15years.) does not qualify.     Additional Screening:  Hepatitis C Screening: does not qualify; Completed   Vision Screening: Recommended annual ophthalmology exams for early detection of glaucoma and other disorders of the eye. Is the patient up to date with their annual eye exam?  Yes  Who is the provider or what is the name of the office in which the patient attends annual eye exams? Dr Tamala Julian If pt is not established with a provider, would they like to be referred to a provider to establish care? No .   Dental Screening: Recommended annual dental exams for proper oral hygiene  Community Resource Referral / Chronic Care Management:  CRR required this visit?  No   CCM required this visit?  No      Plan:     I have personally reviewed and noted the following in the patient's chart:   Medical and social history Use of alcohol, tobacco or illicit drugs  Current medications and supplements including opioid prescriptions. Patient is currently taking opioid prescriptions. Information provided to patient regarding non-opioid alternatives. Patient advised to discuss non-opioid treatment plan with their provider. Functional ability and status Nutritional status Physical activity Advanced directives List of other physicians Hospitalizations, surgeries, and ER visits in previous 12 months Vitals Screenings to include cognitive, depression, and falls Referrals and appointments  In addition, I have reviewed and discussed with patient certain preventive protocols, quality metrics, and best practice recommendations. A written personalized care plan for preventive services as well as general preventive health recommendations were provided to patient.     Criselda Peaches, LPN   3/76/2831   Nurse Notes:   None

## 2022-12-23 NOTE — Telephone Encounter (Signed)
Called placed to Dr. Magda Bernheim office requesting he call Dr. Sondra Come directly to discuss patients recent scan.  Message placed as high priority.

## 2023-01-07 DIAGNOSIS — G894 Chronic pain syndrome: Secondary | ICD-10-CM | POA: Diagnosis not present

## 2023-01-07 DIAGNOSIS — Z79891 Long term (current) use of opiate analgesic: Secondary | ICD-10-CM | POA: Diagnosis not present

## 2023-01-07 DIAGNOSIS — M25551 Pain in right hip: Secondary | ICD-10-CM | POA: Diagnosis not present

## 2023-01-07 DIAGNOSIS — M79604 Pain in right leg: Secondary | ICD-10-CM | POA: Diagnosis not present

## 2023-02-17 ENCOUNTER — Other Ambulatory Visit: Payer: Self-pay | Admitting: Family Medicine

## 2023-03-16 ENCOUNTER — Other Ambulatory Visit: Payer: Self-pay | Admitting: Family Medicine

## 2023-03-24 ENCOUNTER — Other Ambulatory Visit: Payer: Self-pay | Admitting: Family Medicine

## 2023-03-25 DIAGNOSIS — M79604 Pain in right leg: Secondary | ICD-10-CM | POA: Diagnosis not present

## 2023-03-25 DIAGNOSIS — G894 Chronic pain syndrome: Secondary | ICD-10-CM | POA: Diagnosis not present

## 2023-03-25 DIAGNOSIS — M25551 Pain in right hip: Secondary | ICD-10-CM | POA: Diagnosis not present

## 2023-03-25 DIAGNOSIS — Z79891 Long term (current) use of opiate analgesic: Secondary | ICD-10-CM | POA: Diagnosis not present

## 2023-03-30 ENCOUNTER — Ambulatory Visit: Payer: Medicare Other | Admitting: Family Medicine

## 2023-04-09 ENCOUNTER — Other Ambulatory Visit: Payer: Self-pay | Admitting: Family Medicine

## 2023-04-10 ENCOUNTER — Telehealth: Payer: Self-pay | Admitting: *Deleted

## 2023-04-10 NOTE — Telephone Encounter (Signed)
Returned patient's phone call, spoke with patient 

## 2023-04-13 ENCOUNTER — Telehealth: Payer: Self-pay | Admitting: *Deleted

## 2023-04-13 NOTE — Telephone Encounter (Signed)
RETURNED PATIENT'S PHONE CALL, SPOKE WITH PATIENT. ?

## 2023-04-14 ENCOUNTER — Telehealth: Payer: Self-pay | Admitting: *Deleted

## 2023-04-14 NOTE — Telephone Encounter (Signed)
CALLED PATIENT TO INFORM OF MRI AND CHEST X-RAY FOR 04-27-23- ARRIVAL TIME- 4:30 PM, NO RESTRICTIONS TO TEST, PATIENT TO RECEIVE RESULTS FROM DR. KINARD ON 04-30-23- 11:45 AM , SPOKE WITH PATIENT AND SHE IS AWARE OF THESE APPTS. AND THE INSTRUCTIONS

## 2023-04-23 ENCOUNTER — Ambulatory Visit: Payer: Medicare Other | Admitting: Radiation Oncology

## 2023-04-23 ENCOUNTER — Ambulatory Visit: Payer: Self-pay | Admitting: Radiation Oncology

## 2023-04-27 ENCOUNTER — Ambulatory Visit (HOSPITAL_COMMUNITY)
Admission: RE | Admit: 2023-04-27 | Discharge: 2023-04-27 | Disposition: A | Discharge status: Home / Self Care | Payer: Medicare Other | Source: Ambulatory Visit | Attending: Radiation Oncology | Admitting: Radiation Oncology

## 2023-04-27 DIAGNOSIS — C4921 Malignant neoplasm of connective and soft tissue of right lower limb, including hip: Secondary | ICD-10-CM | POA: Insufficient documentation

## 2023-04-27 DIAGNOSIS — Z0389 Encounter for observation for other suspected diseases and conditions ruled out: Secondary | ICD-10-CM | POA: Diagnosis not present

## 2023-04-27 DIAGNOSIS — R600 Localized edema: Secondary | ICD-10-CM | POA: Diagnosis not present

## 2023-04-27 MED ORDER — GADOBUTROL 1 MMOL/ML IV SOLN
7.0000 mL | Freq: Once | INTRAVENOUS | Status: AC | PRN
  Administered 2023-04-27: 7 mL via INTRAVENOUS

## 2023-04-28 ENCOUNTER — Encounter: Payer: Self-pay | Admitting: Family Medicine

## 2023-04-28 ENCOUNTER — Ambulatory Visit (INDEPENDENT_AMBULATORY_CARE_PROVIDER_SITE_OTHER): Payer: Medicare Other | Admitting: Family Medicine

## 2023-04-28 VITALS — BP 130/60 | HR 65 | Temp 98.3°F | Ht 61.0 in | Wt 156.6 lb

## 2023-04-28 DIAGNOSIS — E871 Hypo-osmolality and hyponatremia: Secondary | ICD-10-CM | POA: Diagnosis not present

## 2023-04-28 DIAGNOSIS — I10 Essential (primary) hypertension: Secondary | ICD-10-CM

## 2023-04-28 DIAGNOSIS — C4921 Malignant neoplasm of connective and soft tissue of right lower limb, including hip: Secondary | ICD-10-CM

## 2023-04-28 DIAGNOSIS — E039 Hypothyroidism, unspecified: Secondary | ICD-10-CM

## 2023-04-28 NOTE — Progress Notes (Signed)
Established Patient Office Visit  Subjective   Patient ID: Destiny [name], female    DOB: 11-11-1941  Age: 82 y.o. MRN: [id number]  Chief Complaint  Patient presents with   Medical Management of Chronic Issues    HPI   Destiny [name] is seen for routine 40-month medical follow-up.  Her chronic problems include history of hypertension, hypothyroidism, essential tremor, idiopathic peripheral neuropathy, osteoarthritis involving multiple joints, chronic insomnia, history of liposarcoma right thigh, chronic lymphedema right lower extremity.  She and her husband are living back at [facility].  Destiny [name] has recently been getting oncology follow-up here in [place] after initial treatment at [facility].  She had chest x-ray and an MRI of the femur which are still to be over read.  She has follow-up there later this week.  Blood pressure generally well-controlled at home with mostly readings around 130/60.  She takes amlodipine 5 mg daily, Toprol-XL, Benicar, and low-dose hydralazine.  She has some chronic mild hyponatremia with baseline sodium 130.  No diuretic use.  Does not overly restrict sodium intake and does not appear to be consuming excessive free fluids.  She is followed by chronic pain management is on low-dose hydrocodone per pain management.  Denies any recent falls.  She has lost a few pounds since last November and she states this has been intentional.  Appetite stable.  She has hypothyroidism treated with levothyroxine 137 mcg daily.  Compliant with therapy.  Last thyroid labs were last November and stable.   Past Medical History:  Diagnosis Date   Anxiety    Arthritis    Colon polyps    Complication of anesthesia    BP drops with fentyl and versed   Fatty liver    Hepatic lesion    Hepatitis    hep A  1976   History of hepatitis A    1976   History of radiation therapy    Right thigh- 07/24/19-08/12/19- Dr. Antony Blackbird   Hypertension    Hypothyroidism    Internal  hemorrhoids    Liposarcoma of right thigh ([facility]) 03/2019   Past Surgical History:  Procedure Laterality Date   ABDOMINAL HYSTERECTOMY     APPENDECTOMY     BACK SURGERY      fusion   CESAREAN SECTION     times 2   COLONOSCOPY     EYE SURGERY Left    cataract removed   FINGER ARTHRODESIS Left 10/02/2017   Procedure: LEFT LONG FINGER ARTHRODESIS;  Surgeon: Mack Hook, MD;  Location: [facility];  Service: Orthopedics;  Laterality: Left;   FOOT SURGERY Bilateral    FRACTURE SURGERY     broken ankle   MEDIAL PARTIAL KNEE REPLACEMENT Left    [facility]   REVERSE SHOULDER ARTHROPLASTY Right 12/28/2014   Procedure: REVERSE SHOULDER ARTHROPLASTY;  Surgeon: Mable Paris, MD;  Location[facility];  Service: Orthopedics;  Laterality: Right;  Right reverse total shoulder arthroplasty   REVERSE TOTAL SHOULDER ARTHROPLASTY Right 12/28/2014   DR CHANDLER   rotator cufff     THYROIDECTOMY, PARTIAL     one lobe removed   VARICOSE VEIN SURGERY Right     reports that she has never smoked. She has never used smokeless tobacco. She reports current alcohol use. She reports that she does not use drugs. family history includes Atrial fibrillation in her mother; Cancer - Other in her father; Heart disease in her mother; Pancreatic cancer in her father and maternal uncle. Allergies  Allergen Reactions   Nsaids Other (See Comments)     BRUISES EASILY Other Reaction: BRUISES EASILY Other Reaction: BRUISES EASILY    Oxycodone-Acetaminophen Other (See Comments)     VERY CONSTIPATING, does not provide pain relief   Aspirin Other (See Comments)    Bruising    Augmentin [Amoxicillin-Pot Clavulanate] Other (See Comments)    headache   Ciprocin-Fluocin-Procin [Fluocinolone]    Ciprofloxacin Hcl     tendonitis   Fentanyl Other (See Comments)    Does not work   Corporate investment banker Other (See Comments)   Lyrica [Pregabalin] Other (See Comments)    dysphoria   Oxycodone Other (See  Comments)    Does not work   Versed [employer] Other (See Comments)    Does not work   Diclofenac Sodium Other (See Comments) and Rash    Skin peeling. Voltaren gel-rash and skin peeling    Hydromorphone Hcl Itching     Itching with large doses - able to tolerate lower doses or shorter duration   Voltaren Gel [Diclofenac Sodium] Rash and Other (See Comments)    Skin peeling.    Review of Systems  Constitutional:  Positive for malaise/fatigue. Negative for chills and fever.  Eyes:  Negative for blurred vision.  Respiratory:  Negative for shortness of breath.   Cardiovascular:  Negative for chest pain.  Gastrointestinal:  Negative for abdominal pain.  Neurological:  Negative for dizziness, weakness and headaches.      Objective:     BP 130/60 (BP Location: Left Arm, Cuff Size: Normal)   Pulse 65   Temp 98.3 F (36.8 C) (Oral)   Ht 5\' 1"  (1.549 m)   Wt 156 lb 9.6 oz (71 kg)   SpO2 98%   BMI 29.59 kg/m  BP Readings from Last 3 Encounters:  04/28/23 130/60  10/23/22 (!) 120/45  09/29/22 118/60   Wt Readings from Last 3 Encounters:  04/28/23 156 lb 9.6 oz (71 kg)  12/23/22 161 lb (73 kg)  10/23/22 159 lb 6.4 oz (72.3 kg)      Physical Exam Vitals reviewed.  Constitutional:      General: She is not in acute distress.    Appearance: Normal appearance.  Cardiovascular:     Rate and Rhythm: Normal rate and regular rhythm.  Pulmonary:     Effort: Pulmonary effort is normal.     Breath sounds: Normal breath sounds.  Musculoskeletal:     Comments: She has some chronic mild edema right lower extremity-from her lymphedema  Neurological:     Mental Status: She is alert.      No results found for any visits on 04/28/23.  Last CBC Lab Results  Component Value Date   WBC 13.1 (H) 04/15/2022   HGB 12.1 04/15/2022   HCT 35.6 (L) 04/15/2022   MCV 100.3 (H) 04/15/2022   MCH 34.1 (H) 04/15/2022   RDW 12.5 04/15/2022   PLT 344 04/15/2022   Last metabolic  panel Lab Results  Component Value Date   GLUCOSE 105 (H) 09/29/2022   NA 130 (L) 09/29/2022   K 4.9 09/29/2022   CL 94 (L) 09/29/2022   CO2 28 09/29/2022   BUN 28 (H) 09/29/2022   CREATININE 0.93 09/29/2022   GFRNONAA >60 04/15/2022   CALCIUM 9.2 09/29/2022   PROT 7.1 09/29/2022   ALBUMIN 4.5 09/29/2022   BILITOT 0.3 09/29/2022   ALKPHOS 55 09/29/2022   AST 23 09/29/2022   ALT 16 09/29/2022   ANIONGAP 8 04/15/2022  Last hemoglobin A1c No results found for: "HGBA1C" Last thyroid functions Lab Results  Component Value Date   TSH 1.00 09/29/2022   Last vitamin B12 and Folate Lab Results  Component Value Date   VITAMINB12 1474 (H) 12/05/2009    The ASCVD Risk score (Arnett DK, et al., 2019) failed to calculate for the following reasons:   The 2019 ASCVD risk score is only valid for ages 72 to 36    Assessment & Plan:   #1 hypertension.  Stable by repeat reading today after rest and home readings as well.  Continue to monitor closely.  Continue current multidrug regimen including amlodipine, olmesartan, metoprolol, and hydralazine.  #2 chronic mild hyponatremia.  No diuretic use.  Sodium has been stable for years.  Recheck electrolytes with upcoming labs in a few months  #3 hypothyroidism.  Patient on chronic therapy with levothyroxine 137 mcg daily.  Recheck TSH at follow-up in 4 to 5 months  #4 history of liposarcoma right thigh.  Followed currently by radiation oncology here in [place].  She had recent chest x-ray and MRI thigh which are pending to be over read Chronic lymphedema right lower extremity which is stable     Evelena Peat, MD

## 2023-04-29 NOTE — Progress Notes (Signed)
Radiation Oncology         [phone] ________________________________  Name: Destiny [name] MRN: [id number]  Date: 04/30/2023  DOB: [birth year]  Follow-Up Visit Note  CC: Kristian Covey, MD  Army Fossa, MD  No diagnosis found.  Diagnosis: Stage IIIB (pT3, cN0) Liposarcoma of the Right Thigh, Grade 3    Interval Since Last Radiation: 3 years, 8 months, and 19 days   Radiation Treatment Dates: 06/27/2019 through 08/12/2019 Site Technique Total Dose Dose per Fx Completed Fx Beam Energies  Extremity: Ext_Rt 3D 50/50 2 25/25 6X, 10X  Extremity: Ext_Rt_Bst 3D 16/16 2 8/8 6X   Narrative:  The patient returns today for routine follow-up and to review recent imaging. She was last see her for follow-up on 10/23/22. At which time, the patient was NED and we discussed proceeding with routine follow-up imaging of the right femur.     Subsequent MRI of the right femur performed on 11/07/22 demonstrated: a complex cystic mass measuring 3.9 x 1.8 x 9.3 cm in the surgical bed along the distal posteromedial thigh, with a few thin internal septations abutting the medial margin of the sciatic nerve. Differential considerations for this findings included a post-operative seroma vs a residual or recurrent portion of the liposarcoma given its location.       The surgeon who performed her surgery, Dr. Andrey Campanile, was contacted to review MRI findings. Based on his interpretation, there is no concern for disease recurrence based on comparison to her serial MRI's performed at [facility]. I conveyed this to the patient which provided her reassurance, and I advised proceeding with repeat imaging of the right femur in 6 months which is detailed below.          Her most recent MRI of the right femur on 04/27/23 demonstrated: ***                Allergies:  is allergic to nsaids, oxycodone-acetaminophen, aspirin, augmentin [amoxicillin-pot clavulanate], ciprocin-fluocin-procin [fluocinolone], ciprofloxacin hcl,  fentanyl, fluclorolone, lyrica [pregabalin], oxycodone, versed [midazolam], diclofenac sodium, hydromorphone hcl, and voltaren gel [diclofenac sodium].  Meds: Current Outpatient Medications  Medication Sig Dispense Refill   acetaminophen (TYLENOL) 500 MG tablet Take 500 mg by mouth every 4 (four) hours as needed.     amLODipine (NORVASC) 5 MG tablet TAKE 1 TABLET BY MOUTH DAILY. 90 tablet 1   b complex vitamins capsule Take 1 capsule by mouth daily.     Calcium Carb-Cholecalciferol 600-200 MG-UNIT TABS Take 1 tablet by mouth daily.      cephALEXin (KEFLEX) 250 MG capsule Take 250 mg by mouth daily.     cholecalciferol (VITAMIN D) 1000 UNITS tablet Take 2,000 Units by mouth daily.     gabapentin (NEURONTIN) 300 MG capsule Take 600-900 mg by mouth See admin instructions. Take 300 mg every morning and900 mg at bedtime     hydrALAZINE (APRESOLINE) 25 MG tablet TAKE ONE TABLET BY MOUTH TWICE DAILY 180 tablet 1   HYDROcodone-acetaminophen (NORCO/VICODIN) 5-325 MG tablet Take 1 tablet by mouth every 4 (four) hours as needed for moderate pain or severe pain.      ipratropium (ATROVENT) 0.06 % nasal spray PLACE 2 SPRAYS IN EACH NOSTRIL 3 TIMES A DAY 15 mL 1   levothyroxine (SYNTHROID) 137 MCG tablet TAKE 1 TABLET BY MOUTH DAILY BEFORE BREAKFAST. 90 tablet 0   loratadine-pseudoephedrine (CLARITIN-D 12-HOUR) 5-120 MG per tablet Take 1 tablet by mouth daily.     methocarbamol (ROBAXIN) 750 MG tablet Take 750  mg by mouth 2 (two) times daily as needed for muscle spasms.     metoprolol succinate (TOPROL-XL) 50 MG 24 hr tablet TAKE 1 TABLET BY MOUTH 2 TIMES DAILY. 180 tablet 1   MYRBETRIQ 50 MG TB24 tablet Take 50 mg by mouth daily.     olmesartan (BENICAR) 40 MG tablet TAKE 1 TABLET BY MOUTH DAILY. 90 tablet 1   polyethylene glycol (MIRALAX) 17 g packet Take 17 g by mouth daily. 14 each 0   Soybean-Soy Isoflavones (SOY BALANCE PO) Take 1 tablet by mouth daily. Soy bean extract     zolpidem (AMBIEN) 5 MG  tablet TAKE 1 TABLET BY MOUTH ONCE DAILY AT BEDTIME 90 tablet 1   No current facility-administered medications for this encounter.    Physical Findings: The patient is in no acute distress. Patient is alert and oriented.  vitals were not taken for this visit. .  No significant changes. Lungs are clear to auscultation bilaterally. Heart has regular rate and rhythm. No palpable cervical, supraclavicular, or axillary adenopathy. Abdomen soft, non-tender, normal bowel sounds.   Lab Findings: Lab Results  Component Value Date   WBC 13.1 (H) 04/15/2022   HGB 12.1 04/15/2022   HCT 35.6 (L) 04/15/2022   MCV 100.3 (H) 04/15/2022   PLT 344 04/15/2022    Radiographic Findings: No results found.  Impression:  Stage IIIB (pT3, cN0) Liposarcoma of the Right Thigh, Grade 3    The patient is recovering from the effects of radiation.  ***  Plan:  ***   *** minutes of total time was spent for this patient encounter, including preparation, face-to-face counseling with the patient and coordination of care, physical exam, and documentation of the encounter. ____________________________________  Billie Lade, PhD, MD  This document serves as a record of services personally performed by Antony Blackbird, MD. It was created on his behalf by Neena Rhymes, a trained medical scribe. The creation of this record is based on the scribe's personal observations and the provider's statements to them. This document has been checked and approved by the attending provider.

## 2023-04-30 ENCOUNTER — Ambulatory Visit
Admission: RE | Admit: 2023-04-30 | Discharge: 2023-04-30 | Disposition: A | Discharge status: Home / Self Care | Payer: Medicare Other | Source: Ambulatory Visit | Attending: Radiation Oncology | Admitting: Radiation Oncology

## 2023-04-30 ENCOUNTER — Other Ambulatory Visit: Payer: Self-pay

## 2023-04-30 VITALS — BP 145/60 | HR 61 | Temp 97.1°F | Resp 20 | Ht 61.0 in | Wt 156.4 lb

## 2023-04-30 DIAGNOSIS — M25469 Effusion, unspecified knee: Secondary | ICD-10-CM | POA: Insufficient documentation

## 2023-04-30 DIAGNOSIS — Z79899 Other long term (current) drug therapy: Secondary | ICD-10-CM | POA: Insufficient documentation

## 2023-04-30 DIAGNOSIS — Z79890 Hormone replacement therapy (postmenopausal): Secondary | ICD-10-CM | POA: Diagnosis not present

## 2023-04-30 DIAGNOSIS — Z923 Personal history of irradiation: Secondary | ICD-10-CM | POA: Diagnosis not present

## 2023-04-30 DIAGNOSIS — C4921 Malignant neoplasm of connective and soft tissue of right lower limb, including hip: Secondary | ICD-10-CM

## 2023-04-30 DIAGNOSIS — Z8589 Personal history of malignant neoplasm of other organs and systems: Secondary | ICD-10-CM | POA: Diagnosis not present

## 2023-04-30 DIAGNOSIS — R600 Localized edema: Secondary | ICD-10-CM | POA: Insufficient documentation

## 2023-05-01 ENCOUNTER — Telehealth: Payer: Self-pay | Admitting: *Deleted

## 2023-05-01 NOTE — Telephone Encounter (Signed)
RETURNED PATIENT'S PHONE CALL, SPOKE WITH PATIENT. ?

## 2023-05-05 ENCOUNTER — Other Ambulatory Visit: Payer: Self-pay | Admitting: Family Medicine

## 2023-05-11 ENCOUNTER — Other Ambulatory Visit: Payer: Self-pay | Admitting: Family Medicine

## 2023-05-14 ENCOUNTER — Other Ambulatory Visit: Payer: Self-pay | Admitting: Family Medicine

## 2023-05-21 ENCOUNTER — Other Ambulatory Visit: Payer: Self-pay | Admitting: Family Medicine

## 2023-06-04 DIAGNOSIS — G894 Chronic pain syndrome: Secondary | ICD-10-CM | POA: Diagnosis not present

## 2023-06-04 DIAGNOSIS — Z79891 Long term (current) use of opiate analgesic: Secondary | ICD-10-CM | POA: Diagnosis not present

## 2023-06-04 DIAGNOSIS — M79604 Pain in right leg: Secondary | ICD-10-CM | POA: Diagnosis not present

## 2023-06-04 DIAGNOSIS — M25551 Pain in right hip: Secondary | ICD-10-CM | POA: Diagnosis not present

## 2023-06-25 ENCOUNTER — Ambulatory Visit: Payer: Self-pay | Admitting: Radiation Oncology

## 2023-06-29 DIAGNOSIS — M25572 Pain in left ankle and joints of left foot: Secondary | ICD-10-CM | POA: Diagnosis not present

## 2023-07-06 DIAGNOSIS — M6281 Muscle weakness (generalized): Secondary | ICD-10-CM | POA: Diagnosis not present

## 2023-07-06 DIAGNOSIS — M25672 Stiffness of left ankle, not elsewhere classified: Secondary | ICD-10-CM | POA: Diagnosis not present

## 2023-07-06 DIAGNOSIS — R262 Difficulty in walking, not elsewhere classified: Secondary | ICD-10-CM | POA: Diagnosis not present

## 2023-08-03 ENCOUNTER — Encounter: Payer: Self-pay | Admitting: Family Medicine

## 2023-08-13 DIAGNOSIS — Z79891 Long term (current) use of opiate analgesic: Secondary | ICD-10-CM | POA: Diagnosis not present

## 2023-08-13 DIAGNOSIS — G894 Chronic pain syndrome: Secondary | ICD-10-CM | POA: Diagnosis not present

## 2023-08-13 DIAGNOSIS — Z23 Encounter for immunization: Secondary | ICD-10-CM | POA: Diagnosis not present

## 2023-08-13 DIAGNOSIS — M25551 Pain in right hip: Secondary | ICD-10-CM | POA: Diagnosis not present

## 2023-08-13 DIAGNOSIS — M79604 Pain in right leg: Secondary | ICD-10-CM | POA: Diagnosis not present

## 2023-08-18 ENCOUNTER — Other Ambulatory Visit: Payer: Self-pay

## 2023-08-18 ENCOUNTER — Other Ambulatory Visit: Payer: Self-pay | Admitting: Family Medicine

## 2023-08-18 ENCOUNTER — Encounter: Payer: Self-pay | Admitting: Radiation Oncology

## 2023-08-18 DIAGNOSIS — C4921 Malignant neoplasm of connective and soft tissue of right lower limb, including hip: Secondary | ICD-10-CM

## 2023-08-19 ENCOUNTER — Encounter: Payer: Self-pay | Admitting: Radiation Oncology

## 2023-08-27 ENCOUNTER — Ambulatory Visit: Payer: Medicare Other | Attending: Radiation Oncology | Admitting: Rehabilitation

## 2023-08-27 ENCOUNTER — Encounter: Payer: Self-pay | Admitting: Rehabilitation

## 2023-08-27 ENCOUNTER — Other Ambulatory Visit: Payer: Self-pay

## 2023-08-27 DIAGNOSIS — R2689 Other abnormalities of gait and mobility: Secondary | ICD-10-CM | POA: Insufficient documentation

## 2023-08-27 DIAGNOSIS — I890 Lymphedema, not elsewhere classified: Secondary | ICD-10-CM | POA: Insufficient documentation

## 2023-08-27 DIAGNOSIS — C4921 Malignant neoplasm of connective and soft tissue of right lower limb, including hip: Secondary | ICD-10-CM | POA: Insufficient documentation

## 2023-08-27 DIAGNOSIS — M6281 Muscle weakness (generalized): Secondary | ICD-10-CM | POA: Insufficient documentation

## 2023-08-27 NOTE — Therapy (Signed)
OUTPATIENT PHYSICAL THERAPY LOWER EXTREMITY LYMPHEDEMA EVALUATION  Patient Name: Destiny [name] MRN: [id number] DOB:09-06-1941, 82 y.o., female Today's Date: 08/27/2023  END OF SESSION:  PT End of Session - 08/27/23 1551     Visit Number 1    Number of Visits 8    Date for PT Re-Evaluation 10/29/23    Authorization Type [employer] needs auth    PT Start Time 1200    PT Stop Time 1255    PT Time Calculation (min) 55 min    Activity Tolerance Patient tolerated treatment well    Behavior During Therapy WFL for tasks assessed/performed             Past Medical History:  Diagnosis Date   Anxiety    Arthritis    Colon polyps    Complication of anesthesia    BP drops with fentyl and versed   Fatty liver    Hepatic lesion    Hepatitis    hep A  1976   History of hepatitis A    1976   History of radiation therapy    Right thigh- 07/24/19-08/12/19- Dr. Antony Blackbird   Hypertension    Hypothyroidism    Internal hemorrhoids    Liposarcoma of right thigh (HCC) 03/2019   Past Surgical History:  Procedure Laterality Date   ABDOMINAL HYSTERECTOMY     APPENDECTOMY     BACK SURGERY      fusion   CESAREAN SECTION     times 2   COLONOSCOPY     EYE SURGERY Left    cataract removed   FINGER ARTHRODESIS Left 10/02/2017   Procedure: LEFT LONG FINGER ARTHRODESIS;  Surgeon: Mack Hook, MD;  Location: [facility];  Service: Orthopedics;  Laterality: Left;   FOOT SURGERY Bilateral    FRACTURE SURGERY     broken ankle   MEDIAL PARTIAL KNEE REPLACEMENT Left    done at [facility]   REVERSE SHOULDER ARTHROPLASTY Right 12/28/2014   Procedure: REVERSE SHOULDER ARTHROPLASTY;  Surgeon: Mable Paris, MD;  Location: [facility];  Service: Orthopedics;  Laterality: Right;  Right reverse total shoulder arthroplasty   REVERSE TOTAL SHOULDER ARTHROPLASTY Right 12/28/2014   DR CHANDLER   rotator cufff     THYROIDECTOMY, PARTIAL     one lobe removed   VARICOSE VEIN SURGERY  Right    Patient Active Problem List   Diagnosis Date Noted   Abnormal involuntary movement 12/26/2020   Body mass index (BMI) 27.0-27.9, adult 12/26/2020   Chronic anxiety 12/26/2020   Constipation 12/26/2020   Easy bruising 12/26/2020   Edema of lower extremity 12/26/2020   Localized swelling, mass and lump, lower limb 12/26/2020   Encounter for general adult medical examination with abnormal findings 12/26/2020   Family history of breast cancer gene mutation in first degree relative 12/26/2020   Fatty liver 12/26/2020   Hearing loss 12/26/2020   History of thrombophlebitis 12/26/2020   Hyponatremia 12/26/2020   Muscle cramps 12/26/2020   Osteoarthritis 12/26/2020   Osteopenia 12/26/2020   Personal history of other malignant neoplasm of skin 12/26/2020   Postoperative hypothyroidism 12/26/2020   Raynaud's disease 12/26/2020   Lymphedema of right lower extremity 10/05/2020   Essential tremor 10/05/2019   Near syncope 04/28/2019   Adjustment disorder with mixed anxiety and depressed mood 04/28/2019   Liposarcoma of right thigh (HCC) 04/28/2019   Osteoarthritis of right knee 03/10/2019   Hip pain, bilateral 01/07/2018   Sinus tarsi syndrome 08/20/2016   Right knee  pain 07/17/2016   S/p reverse total shoulder arthroplasty 12/28/2014   Abnormality of gait 04/25/2014   Foot pain 06/03/2011   Metatarsalgia 06/03/2011   Chronic pain syndrome 12/05/2009   Chronic insomnia 08/15/2009   INGROWN TOENAIL 06/06/2009   LEG PAIN, BILATERAL 06/05/2009   OTHER VITAMIN B12 DEFICIENCY ANEMIA 04/04/2009   Megaloblastic anemia due to vitamin B12 deficiency 04/04/2009   MALAISE AND FATIGUE 02/28/2009   Hypothyroidism 07/26/2008   NEUROPATHY, IDIOPATHIC PERIPHERAL NEC 09/16/2007   Essential hypertension 01/28/2007   Arthritis of hand, degenerative 01/28/2007   ROTATOR CUFF INJURY, RIGHT SHOULDER 01/28/2007   OSTEOPENIA 01/28/2007   Disorder of bone and cartilage 01/28/2007    PCP: Dr.  Evelena Peat  REFERRING PROVIDER: Dr. Antony Blackbird  REFERRING DIAG:  Diagnosis  C49.21 (ICD-10-CM) - Liposarcoma of right thigh (HCC)    THERAPY DIAG:  Lymphedema  Rationale for Evaluation and Treatment: Rehabilitation  ONSET DATE: 2020  SUBJECTIVE:                                                                                                                                                                                           SUBJECTIVE STATEMENT: I have been wearing compression on the lower leg during the day.  If I try to keep the thigh piece on it just slides down.  I have also been using my tributes at night.  And using my compression machine 1 hour per night.   Has medium regular thigh piece:     PERTINENT HISTORY: Liposarcoma of the Rt thigh with sartorious removed 11.5 cm intramuscular mass. Radical resection performed 04/15/19. Finished radiation 08/12/2019. Most recent follow up show continued NED and no METS . Known to this clinic: Has compression Juzo Ready wrap full leg and tribute night garments.  Has flexitouch system.    PAIN:  Are you having pain? No  PRECAUTIONS: Rt LE lymphedema   RED FLAGS: None   WEIGHT BEARING RESTRICTIONS: No  FALLS:  Has patient fallen in last 6 months? No  LIVING ENVIRONMENT: Lives with: lives with their family and lives with their spouse  OCCUPATION: Retired  LEISURE: nothing special   PATIENT GOALS: Get new garments, see if I need to do anything different.    OBJECTIVE: Note: Objective measures were completed at Evaluation unless otherwise noted.  COGNITION:  Overall cognitive status: Within functional limits for tasks assessed   OBSERVATIONS / OTHER ASSESSMENTS: Larger Rt full leg with more of a pitting fluctuating edema in the lower leg and more of a solid state in the thigh and Rt glute - see photos below.  Sent note to Dr. Roselind Messier  per pt request regarding telangectasia behind the knee as she is not sure if this  is always there or now.          LYMPHEDEMA ASSESSMENTS:    LE LANDMARK RIGHT 2023 08/27/23  At groin    30 cm proximal to suprapatella    20 cm proximal to suprapatella 54.5 56.5  10 cm proximal to suprapatella 42 42  At midpatella / popliteal crease 39 39  30 cm proximal to floor at lateral plantar foot 37.5 37.5  20 cm proximal to floor at lateral plantar foot 30.5 29.7  10 cm proximal to floor at lateral plantar foot 26.3 25.0  Circumference of ankle/heel    5 cm proximal to 1st MTP joint 21.5 21.5  Across MTP joint 21.7 21.9  Around proximal great toe 7.8 8.1  (Blank rows = not tested) +1-2 pitting Rt lower leg, not thigh or glute region Rt from umbilicus to midline spine: 51cm  Lt: 49cm   TODAY'S TREATMENT:                                                                                                                                         DATE: 08/28/23 Eval performed Discussed POC - pt would like to see what her insurance coverage is for new custom items vs RTW. Gave her sheet on how to check her benefits and will let us know what her coverage will be and what DME to use.   Discussed: full velcro leg (which she has) with shorts or capris over to hold in place (will need) and new tribute RTW or custom garments.    PATIENT EDUCATION:  Education details: per today's note Person educated: Patient Education method: Medical illustrator Education comprehension: verbalized understanding  HOME EXERCISE PROGRAM: Continue pump, MLD, use of garments  ASSESSMENT:  CLINICAL IMPRESSION: Patient is a 82 y.o. who was seen today for physical therapy evaluation and treatment for her chronic Rt lower extremity lymphedema.  She has stage 2-3 lymphedema of the Rt lower extremity with new complaints of more edema in the Rt glute and upper inner thigh.  Per photos pt does have a larger Rt glute region measuring around 2cm larger.  Her leg measurements are pretty constant  except for her upper thigh being 2cm larger.  The lower leg is smaller or equal showing that her current ready wrap garment is working well.  She would like to have flexitouch contacted to see if any changes should be made, get new garments, and continue with excellent self care.  She would benefit from a custom night garment due to trouble donning, hand OA, and worsening upper thigh and glute region edema needing more coverage.     OBJECTIVE IMPAIRMENTS: Abnormal gait, decreased knowledge of use of DME, increased edema, and increased fascial restrictions.   ACTIVITY LIMITATIONS: standing and squatting  PARTICIPATION LIMITATIONS: none  PERSONAL  FACTORS: Age, Time since onset of injury/illness/exacerbation, and 1-2 comorbidities: radiation hx, chronic seroma  are also affecting patient's functional outcome.   REHAB POTENTIAL: Excellent  CLINICAL DECISION MAKING: Stable/uncomplicated  EVALUATION COMPLEXITY: Low   GOALS: Goals reviewed with patient? Yes    LONG TERM GOALS: Target date: 12/5//24  Pt will be measured and have new garments ordered Baseline:  Goal status: INITIAL  2.  Pt will have a visit from flexitouch for sequence changes for the upper thigh and glute Baseline:  Goal status: INITIAL   PLAN:  PT FREQUENCY: 1x/month  PT DURATION: 12 weeks  PLANNED INTERVENTIONS: Therapeutic exercises, Therapeutic activity, Patient/Family education, Self Care, Prosthetic training, DME instructions, Manual therapy, and Re-evaluation  PLAN FOR NEXT SESSION: coverage check? Where to send? Order shorts vs capris, vs leggings,  hoping for custom tribute to include hip but may need RTW     Idamae Lusher, PT 08/27/2023, 3:52 PM

## 2023-08-31 ENCOUNTER — Other Ambulatory Visit: Payer: Self-pay | Admitting: Family Medicine

## 2023-09-08 ENCOUNTER — Ambulatory Visit: Payer: Medicare Other | Admitting: Rehabilitation

## 2023-09-08 ENCOUNTER — Encounter: Payer: Self-pay | Admitting: Rehabilitation

## 2023-09-08 DIAGNOSIS — I890 Lymphedema, not elsewhere classified: Secondary | ICD-10-CM

## 2023-09-08 DIAGNOSIS — C4921 Malignant neoplasm of connective and soft tissue of right lower limb, including hip: Secondary | ICD-10-CM | POA: Diagnosis not present

## 2023-09-08 DIAGNOSIS — M6281 Muscle weakness (generalized): Secondary | ICD-10-CM

## 2023-09-08 DIAGNOSIS — R2689 Other abnormalities of gait and mobility: Secondary | ICD-10-CM

## 2023-09-08 NOTE — Therapy (Signed)
OUTPATIENT PHYSICAL THERAPY LOWER EXTREMITY LYMPHEDEMA EVALUATION  Patient Name: Destiny [name] MRN: [id number] DOB:15-Jun-1941, 82 y.o., female Today's Date: 09/15/2023  END OF SESSION:    Past Medical History:  Diagnosis Date   Anxiety    Arthritis    Colon polyps    Complication of anesthesia    BP drops with fentyl and versed   Fatty liver    Hepatic lesion    Hepatitis    hep A  1976   History of hepatitis A    1976   History of radiation therapy    Right thigh- 07/24/19-08/12/19- Dr. Antony Blackbird   Hypertension    Hypothyroidism    Internal hemorrhoids    Liposarcoma of right thigh (HCC) 03/2019   Past Surgical History:  Procedure Laterality Date   ABDOMINAL HYSTERECTOMY     APPENDECTOMY     BACK SURGERY      fusion   CESAREAN SECTION     times 2   COLONOSCOPY     EYE SURGERY Left    cataract removed   FINGER ARTHRODESIS Left 10/02/2017   Procedure: LEFT LONG FINGER ARTHRODESIS;  Surgeon: Mack Hook, MD;  Location: [facility];  Service: Orthopedics;  Laterality: Left;   FOOT SURGERY Bilateral    FRACTURE SURGERY     broken ankle   MEDIAL PARTIAL KNEE REPLACEMENT Left    done at [facility]   REVERSE SHOULDER ARTHROPLASTY Right 12/28/2014   Procedure: REVERSE SHOULDER ARTHROPLASTY;  Surgeon: Mable Paris, MD;  Location: [facility] OR;  Service: Orthopedics;  Laterality: Right;  Right reverse total shoulder arthroplasty   REVERSE TOTAL SHOULDER ARTHROPLASTY Right 12/28/2014   DR CHANDLER   rotator cufff     THYROIDECTOMY, PARTIAL     one lobe removed   VARICOSE VEIN SURGERY Right    Patient Active Problem List   Diagnosis Date Noted   Abnormal involuntary movement 12/26/2020   Body mass index (BMI) 27.0-27.9, adult 12/26/2020   Chronic anxiety 12/26/2020   Constipation 12/26/2020   Easy bruising 12/26/2020   Edema of lower extremity 12/26/2020   Localized swelling, mass and lump, lower limb 12/26/2020   Encounter for general  adult medical examination with abnormal findings 12/26/2020   Family history of breast cancer gene mutation in first degree relative 12/26/2020   Fatty liver 12/26/2020   Hearing loss 12/26/2020   History of thrombophlebitis 12/26/2020   Hyponatremia 12/26/2020   Muscle cramps 12/26/2020   Osteoarthritis 12/26/2020   Osteopenia 12/26/2020   Personal history of other malignant neoplasm of skin 12/26/2020   Postoperative hypothyroidism 12/26/2020   Raynaud's disease 12/26/2020   Lymphedema of right lower extremity 10/05/2020   Essential tremor 10/05/2019   Near syncope 04/28/2019   Adjustment disorder with mixed anxiety and depressed mood 04/28/2019   Liposarcoma of right thigh (HCC) 04/28/2019   Osteoarthritis of right knee 03/10/2019   Hip pain, bilateral 01/07/2018   Sinus tarsi syndrome 08/20/2016   Right knee pain 07/17/2016   S/p reverse total shoulder arthroplasty 12/28/2014   Abnormality of gait 04/25/2014   Foot pain 06/03/2011   Metatarsalgia 06/03/2011   Chronic pain syndrome 12/05/2009   Chronic insomnia 08/15/2009   INGROWN TOENAIL 06/06/2009   LEG PAIN, BILATERAL 06/05/2009   OTHER VITAMIN B12 DEFICIENCY ANEMIA 04/04/2009   Megaloblastic anemia due to vitamin B12 deficiency 04/04/2009   MALAISE AND FATIGUE 02/28/2009   Hypothyroidism 07/26/2008   NEUROPATHY, IDIOPATHIC PERIPHERAL NEC 09/16/2007   Essential hypertension 01/28/2007  Arthritis of hand, degenerative 01/28/2007   ROTATOR CUFF INJURY, RIGHT SHOULDER 01/28/2007   OSTEOPENIA 01/28/2007   Disorder of bone and cartilage 01/28/2007    PCP: Dr. Evelena Peat  REFERRING PROVIDER: Dr. Antony Blackbird  REFERRING DIAG:  Diagnosis  C49.21 (ICD-10-CM) - Liposarcoma of right thigh (HCC)    THERAPY DIAG:  Lymphedema  Other abnormalities of gait and mobility  Muscle weakness (generalized)  Rationale for Evaluation and Treatment: Rehabilitation  ONSET DATE: 2020  SUBJECTIVE:                                                                                                                                                                                            SUBJECTIVE STATEMENT: I am going to get measured for that garment   PERTINENT HISTORY: Liposarcoma of the Rt thigh with sartorious removed 11.5 cm intramuscular mass. Radical resection performed 04/15/19. Finished radiation 08/12/2019. Most recent follow up show continued NED and no METS . Known to this clinic: Has compression Juzo Ready wrap full leg and tribute night garments.  Has flexitouch system.    PAIN:  Are you having pain? No  PRECAUTIONS: Rt LE lymphedema   RED FLAGS: None   WEIGHT BEARING RESTRICTIONS: No  FALLS:  Has patient fallen in last 6 months? No  LIVING ENVIRONMENT: Lives with: lives with their family and lives with their spouse  OCCUPATION: Retired  LEISURE: nothing special   PATIENT GOALS: Get new garments, see if I need to do anything different.    OBJECTIVE: Note: Objective measures were completed at Evaluation unless otherwise noted.  COGNITION:  Overall cognitive status: Within functional limits for tasks assessed   OBSERVATIONS / OTHER ASSESSMENTS: Larger Rt full leg with more of a pitting fluctuating edema in the lower leg and more of a solid state in the thigh and Rt glute - see photos below.  Sent note to Dr. Roselind Messier per pt request regarding telangectasia behind the knee as she is not sure if this is always there or now.          LYMPHEDEMA ASSESSMENTS:    LE LANDMARK RIGHT 2023 08/27/23  At groin    30 cm proximal to suprapatella    20 cm proximal to suprapatella 54.5 56.5  10 cm proximal to suprapatella 42 42  At midpatella / popliteal crease 39 39  30 cm proximal to floor at lateral plantar foot 37.5 37.5  20 cm proximal to floor at lateral plantar foot 30.5 29.7  10 cm proximal to floor at lateral plantar foot 26.3 25.0  Circumference of ankle/heel    5 cm proximal to 1st  MTP joint 21.5 21.5  Across MTP joint 21.7 21.9  Around proximal great toe 7.8 8.1  (Blank rows = not tested) +1-2 pitting Rt lower leg, not thigh or glute region Rt from umbilicus to midline spine: 51cm  Lt: 49cm   TODAY'S TREATMENT:                                                                                                                                         DATE:  09/08/23 Measured pt for custom tribute night garment.   08/28/23 Eval performed Discussed POC - pt would like to see what her insurance coverage is for new custom items vs RTW. Gave her sheet on how to check her benefits and will let us know what her coverage will be and what DME to use.   Discussed: full velcro leg (which she has) with shorts or capris over to hold in place (will need) and new tribute RTW or custom garments.    PATIENT EDUCATION:  Education details: per today's note Person educated: Patient Education method: Medical illustrator Education comprehension: verbalized understanding  HOME EXERCISE PROGRAM: Continue pump, MLD, use of garments  ASSESSMENT:  CLINICAL IMPRESSION: Measured for custom tribute night.   OBJECTIVE IMPAIRMENTS: Abnormal gait, decreased knowledge of use of DME, increased edema, and increased fascial restrictions.   ACTIVITY LIMITATIONS: standing and squatting  PARTICIPATION LIMITATIONS: none  PERSONAL FACTORS: Age, Time since onset of injury/illness/exacerbation, and 1-2 comorbidities: radiation hx, chronic seroma  are also affecting patient's functional outcome.   REHAB POTENTIAL: Excellent  CLINICAL DECISION MAKING: Stable/uncomplicated  EVALUATION COMPLEXITY: Low   GOALS: Goals reviewed with patient? Yes    LONG TERM GOALS: Target date: 12/5//24  Pt will be measured and have new garments ordered Baseline:  Goal status: INITIAL  2.  Pt will have a visit from flexitouch for sequence changes for the upper thigh and glute Baseline:  Goal  status: INITIAL   PLAN:  PT FREQUENCY: 1x/month  PT DURATION: 12 weeks  PLANNED INTERVENTIONS: Therapeutic exercises, Therapeutic activity, Patient/Family education, Self Care, Prosthetic training, DME instructions, Manual therapy, and Re-evaluation  PLAN FOR NEXT SESSION: coverage check? Where to send? Order shorts vs capris, vs leggings,  hoping for custom tribute to include hip but may need RTW     Idamae Lusher, PT 09/15/2023, 8:16 AM

## 2023-09-10 DIAGNOSIS — N3020 Other chronic cystitis without hematuria: Secondary | ICD-10-CM | POA: Diagnosis not present

## 2023-09-10 DIAGNOSIS — R350 Frequency of micturition: Secondary | ICD-10-CM | POA: Diagnosis not present

## 2023-09-14 ENCOUNTER — Other Ambulatory Visit: Payer: Self-pay | Admitting: Family Medicine

## 2023-09-28 ENCOUNTER — Other Ambulatory Visit: Payer: Self-pay | Admitting: Family Medicine

## 2023-09-28 ENCOUNTER — Ambulatory Visit (INDEPENDENT_AMBULATORY_CARE_PROVIDER_SITE_OTHER): Payer: Medicare Other | Admitting: Family Medicine

## 2023-09-28 VITALS — BP 138/60 | HR 67 | Temp 97.7°F | Ht 61.0 in | Wt 154.6 lb

## 2023-09-28 DIAGNOSIS — E039 Hypothyroidism, unspecified: Secondary | ICD-10-CM | POA: Diagnosis not present

## 2023-09-28 DIAGNOSIS — I10 Essential (primary) hypertension: Secondary | ICD-10-CM | POA: Diagnosis not present

## 2023-09-28 DIAGNOSIS — E871 Hypo-osmolality and hyponatremia: Secondary | ICD-10-CM | POA: Diagnosis not present

## 2023-09-28 DIAGNOSIS — R3915 Urgency of urination: Secondary | ICD-10-CM

## 2023-09-28 LAB — COMPREHENSIVE METABOLIC PANEL
ALT: 17 U/L (ref 0–35)
AST: 23 U/L (ref 0–37)
Albumin: 4.4 g/dL (ref 3.5–5.2)
Alkaline Phosphatase: 60 U/L (ref 39–117)
BUN: 27 mg/dL — ABNORMAL HIGH (ref 6–23)
CO2: 29 meq/L (ref 19–32)
Calcium: 9.4 mg/dL (ref 8.4–10.5)
Chloride: 94 meq/L — ABNORMAL LOW (ref 96–112)
Creatinine, Ser: 0.93 mg/dL (ref 0.40–1.20)
GFR: 56.38 mL/min — ABNORMAL LOW (ref >60.00)
Glucose, Bld: 104 mg/dL — ABNORMAL HIGH (ref 70–99)
Potassium: 4.9 meq/L (ref 3.5–5.1)
Sodium: 129 meq/L — ABNORMAL LOW (ref 135–145)
Total Bilirubin: 0.3 mg/dL (ref 0.2–1.2)
Total Protein: 7.1 g/dL (ref 6.0–8.3)

## 2023-09-28 LAB — TSH: TSH: 1.84 u[IU]/mL (ref 0.35–5.50)

## 2023-09-28 MED ORDER — AMLODIPINE BESYLATE 5 MG PO TABS: 5.0000 mg | ORAL_TABLET | Freq: Every day | ORAL | 3 refills | Status: DC

## 2023-09-28 NOTE — Progress Notes (Signed)
Established Patient Office Visit  Subjective   Patient ID: Johnathan Tortorelli, female    DOB: 01/19/1941  Age: 82 y.o. MRN: [id number]  Chief Complaint  Patient presents with   Medical Management of Chronic Issues    HPI   Destiny [name] is seen for medical follow-up.  She has history of hypertension, hypothyroidism, essential tremor, osteoarthritis, osteopenia, chronic lymphedema right lower extremity, and history of liposarcoma of right thigh.  She is getting follow-up scans with MRI of the femur and chest x-ray every 6 months.  No evidence recurrence.  Appetite and weight stable.  Her blood pressure is currently managed with amlodipine, hydralazine, and metoprolol as well as Benicar 40 mg daily.  She has had some chronic hyponatremia with sodium around 130 and not a good candidate for thiazides.  Blood pressures have generally been well-controlled by home readings.  Up slightly today but did improve some after rest. She does take Claritin-D but states she has been on this for at least 30 years.  Recently had some issues with increased urine urgency.  Was not getting much benefit from Myrbetriq.  Urologist switched to Gemtesa 75 mg daily and this seems to helping better.  Hypothyroidism on replacement with levothyroxine 137 mcg daily.  Compliant with therapy.  Needs follow-up labs.  Generally feels well.  Has more fatigue than she used to which she thinks is age-related  Past Medical History:  Diagnosis Date   Anxiety    Arthritis    Colon polyps    Complication of anesthesia    BP drops with fentyl and versed   Fatty liver    Hepatic lesion    Hepatitis    hep A  1976   History of hepatitis A    1976   History of radiation therapy    Right thigh- 07/24/19-08/12/19- Dr. Antony Blackbird   Hypertension    Hypothyroidism    Internal hemorrhoids    Liposarcoma of right thigh (HCC) 03/2019   Past Surgical History:  Procedure Laterality Date   ABDOMINAL HYSTERECTOMY     APPENDECTOMY      BACK SURGERY      fusion   CESAREAN SECTION     times 2   COLONOSCOPY     EYE SURGERY Left    cataract removed   FINGER ARTHRODESIS Left 10/02/2017   Procedure: LEFT LONG FINGER ARTHRODESIS;  Surgeon: Mack Hook, MD;  Location: [facility];  Service: Orthopedics;  Laterality: Left;   FOOT SURGERY Bilateral    FRACTURE SURGERY     broken ankle   MEDIAL PARTIAL KNEE REPLACEMENT Left    done at [facility]   REVERSE SHOULDER ARTHROPLASTY Right 12/28/2014   Procedure: REVERSE SHOULDER ARTHROPLASTY;  Surgeon: Mable Paris, MD;  Location: [facility] OR;  Service: Orthopedics;  Laterality: Right;  Right reverse total shoulder arthroplasty   REVERSE TOTAL SHOULDER ARTHROPLASTY Right 12/28/2014   DR CHANDLER   rotator cufff     THYROIDECTOMY, PARTIAL     one lobe removed   VARICOSE VEIN SURGERY Right     reports that she has never smoked. She has never used smokeless tobacco. She reports current alcohol use. She reports that she does not use drugs. family history includes Atrial fibrillation in her mother; Cancer - Other in her father; Heart disease in her mother; Pancreatic cancer in her father and maternal uncle. Allergies  Allergen Reactions   Nsaids Other (See Comments)     BRUISES EASILY Other Reaction: BRUISES EASILY  Other Reaction: BRUISES EASILY    Oxycodone-Acetaminophen Other (See Comments)     VERY CONSTIPATING, does not provide pain relief   Aspirin Other (See Comments)    Bruising    Augmentin [Amoxicillin-Pot Clavulanate] Other (See Comments)    headache   Ciprocin-Fluocin-Procin [Fluocinolone]    Ciprofloxacin Hcl     tendonitis   Fentanyl Other (See Comments)    Does not work   Corporate investment [employer] Other (See Comments)   Lyrica [Pregabalin] Other (See Comments)    dysphoria   Oxycodone Other (See Comments)    Does not work   [employer]ed [employer] Other (See Comments)    Does not work   Diclofenac Sodium Other (See Comments) and Rash    Skin  peeling. Voltaren gel-rash and skin peeling    Hydromorphone Hcl Itching     Itching with large doses - able to tolerate lower doses or shorter duration   Voltaren Gel [Diclofenac Sodium] Rash and Other (See Comments)    Skin peeling.     Review of Systems  Constitutional:  Negative for chills, fever and malaise/fatigue.  Eyes:  Negative for blurred vision.  Respiratory:  Negative for shortness of breath.   Cardiovascular:  Negative for chest pain.  Genitourinary:  Positive for urgency.  Neurological:  Negative for dizziness, weakness and headaches.      Objective:     BP 138/60 (BP Location: Left Arm, Cuff Size: Normal)   Pulse 67   Temp 97.7 F (36.5 C) (Oral)   Ht 5\' 1"  (1.549 m)   Wt 154 lb 9.6 oz (70.1 kg)   SpO2 98%   BMI 29.21 kg/m  BP Readings from Last 3 Encounters:  09/28/23 138/60  04/30/23 (!) 145/60  04/28/23 130/60   Wt Readings from Last 3 Encounters:  09/28/23 154 lb 9.6 oz (70.1 kg)  04/30/23 156 lb 6.4 oz (70.9 kg)  04/28/23 156 lb 9.6 oz (71 kg)      Physical Exam Vitals reviewed.  Constitutional:      General: She is not in acute distress.    Appearance: Normal appearance.  Cardiovascular:     Rate and Rhythm: Normal rate and regular rhythm.  Pulmonary:     Effort: Pulmonary effort is normal.     Breath sounds: Normal breath sounds. No wheezing or rales.  Musculoskeletal:     Comments: No significant edema left lower extremity.  She has some chronic lymphedema right lower extremity.  Neurological:     Mental Status: She is alert.      No results found for any visits on 09/28/23.  Last CBC Lab Results  Component Value Date   WBC 13.1 (H) 04/15/2022   HGB 12.1 04/15/2022   HCT 35.6 (L) 04/15/2022   MCV 100.3 (H) 04/15/2022   MCH 34.1 (H) 04/15/2022   RDW 12.5 04/15/2022   PLT 344 04/15/2022   Last metabolic panel Lab Results  Component Value Date   GLUCOSE 105 (H) 09/29/2022   NA 130 (L) 09/29/2022   K 4.9 09/29/2022    CL 94 (L) 09/29/2022   CO2 28 09/29/2022   BUN 28 (H) 09/29/2022   CREATININE 0.93 09/29/2022   GFR 56.77 (L) 09/29/2022   CALCIUM 9.2 09/29/2022   PROT 7.1 09/29/2022   ALBUMIN 4.5 09/29/2022   BILITOT 0.3 09/29/2022   ALKPHOS 55 09/29/2022   AST 23 09/29/2022   ALT 16 09/29/2022   ANIONGAP 8 04/15/2022   Last thyroid functions Lab Results  Component Value Date  TSH 1.00 09/29/2022      The ASCVD Risk score (Arnett DK, et al., 2019) failed to calculate for the following reasons:   The 2019 ASCVD risk score is only valid for ages 62 to 13    Assessment & Plan:   #1 hypothyroidism.  Patient on replacement.  Recheck TSH today and adjust accordingly  #2 hypertension.  Initial reading up today but improved some after rest.  Well-controlled generally by other readings outside of here and also by home readings.  Continue to monitor closely.  Continue current regimen of Benicar, amlodipine, metoprolol, and low-dose hydralazine.  #3 history of mild chronic hyponatremia.  No thiazide use.  Recheck chemistries.  #4 urine urgency currently improved on Gemtesa 75 mg daily.   Return in about 6 months (around 03/27/2024).    Evelena Peat, MD

## 2023-10-08 DIAGNOSIS — Z79891 Long term (current) use of opiate analgesic: Secondary | ICD-10-CM | POA: Diagnosis not present

## 2023-10-08 DIAGNOSIS — G894 Chronic pain syndrome: Secondary | ICD-10-CM | POA: Diagnosis not present

## 2023-10-08 DIAGNOSIS — M25551 Pain in right hip: Secondary | ICD-10-CM | POA: Diagnosis not present

## 2023-10-08 DIAGNOSIS — M79604 Pain in right leg: Secondary | ICD-10-CM | POA: Diagnosis not present

## 2023-10-19 ENCOUNTER — Telehealth: Payer: Self-pay | Admitting: *Deleted

## 2023-10-19 NOTE — Telephone Encounter (Signed)
Called patient to inform of MRI and chest x-ray for 10-30-23- arrival time- 12 pm @ [facility], no restrictions to scan, patient to receive results from Dr. Roselind Messier on 11-02-23 @ 11:30 am, spoke with patient and she is aware of these appts. and the instructions

## 2023-10-26 ENCOUNTER — Other Ambulatory Visit: Payer: Self-pay | Admitting: Family Medicine

## 2023-10-26 DIAGNOSIS — D2261 Melanocytic nevi of right upper limb, including shoulder: Secondary | ICD-10-CM | POA: Diagnosis not present

## 2023-10-26 DIAGNOSIS — C4441 Basal cell carcinoma of skin of scalp and neck: Secondary | ICD-10-CM | POA: Diagnosis not present

## 2023-10-26 DIAGNOSIS — L905 Scar conditions and fibrosis of skin: Secondary | ICD-10-CM | POA: Diagnosis not present

## 2023-10-26 DIAGNOSIS — C44612 Basal cell carcinoma of skin of right upper limb, including shoulder: Secondary | ICD-10-CM | POA: Diagnosis not present

## 2023-10-26 DIAGNOSIS — D2271 Melanocytic nevi of right lower limb, including hip: Secondary | ICD-10-CM | POA: Diagnosis not present

## 2023-10-26 DIAGNOSIS — Z85828 Personal history of other malignant neoplasm of skin: Secondary | ICD-10-CM | POA: Diagnosis not present

## 2023-10-26 DIAGNOSIS — L821 Other seborrheic keratosis: Secondary | ICD-10-CM | POA: Diagnosis not present

## 2023-10-26 DIAGNOSIS — L820 Inflamed seborrheic keratosis: Secondary | ICD-10-CM | POA: Diagnosis not present

## 2023-10-26 DIAGNOSIS — D2262 Melanocytic nevi of left upper limb, including shoulder: Secondary | ICD-10-CM | POA: Diagnosis not present

## 2023-10-26 DIAGNOSIS — D485 Neoplasm of uncertain behavior of skin: Secondary | ICD-10-CM | POA: Diagnosis not present

## 2023-10-26 DIAGNOSIS — D2272 Melanocytic nevi of left lower limb, including hip: Secondary | ICD-10-CM | POA: Diagnosis not present

## 2023-10-29 ENCOUNTER — Ambulatory Visit: Payer: Medicare Other | Admitting: Radiation Oncology

## 2023-10-30 ENCOUNTER — Ambulatory Visit (HOSPITAL_COMMUNITY)
Admission: RE | Admit: 2023-10-30 | Discharge: 2023-10-30 | Disposition: A | Discharge status: Home / Self Care | Payer: Medicare Other | Source: Ambulatory Visit | Attending: Radiation Oncology | Admitting: Radiation Oncology

## 2023-10-30 ENCOUNTER — Telehealth: Payer: Self-pay | Admitting: Rehabilitation

## 2023-10-30 DIAGNOSIS — Z471 Aftercare following joint replacement surgery: Secondary | ICD-10-CM | POA: Diagnosis not present

## 2023-10-30 DIAGNOSIS — M7061 Trochanteric bursitis, right hip: Secondary | ICD-10-CM | POA: Diagnosis not present

## 2023-10-30 DIAGNOSIS — M25461 Effusion, right knee: Secondary | ICD-10-CM | POA: Diagnosis not present

## 2023-10-30 DIAGNOSIS — C4921 Malignant neoplasm of connective and soft tissue of right lower limb, including hip: Secondary | ICD-10-CM

## 2023-10-30 DIAGNOSIS — C499 Malignant neoplasm of connective and soft tissue, unspecified: Secondary | ICD-10-CM | POA: Diagnosis not present

## 2023-10-30 DIAGNOSIS — Z96611 Presence of right artificial shoulder joint: Secondary | ICD-10-CM | POA: Diagnosis not present

## 2023-10-30 MED ORDER — GADOBUTROL 1 MMOL/ML IV SOLN
7.0000 mL | Freq: Once | INTRAVENOUS | Status: AC | PRN
  Administered 2023-10-30: 7 mL via INTRAVENOUS

## 2023-11-01 NOTE — Progress Notes (Signed)
Radiation Oncology         ([phone]) [phone] ________________________________  Name: Destiny [name] MRN: [id number]  Date: 11/02/2023  DOB: 1941/08/11  Follow-Up Visit Note  CC: Kristian Covey, MD  Army Fossa, MD  No diagnosis found.  Diagnosis: Stage IIIB (pT3, cN0) Liposarcoma of the Right Thigh, Grade 3    Interval Since Last Radiation: 4 years, 2 months, and 21 days   Radiation Treatment Dates: 06/27/2019 through 08/12/2019 Site Technique Total Dose Dose per Fx Completed Fx Beam Energies  Extremity: Ext_Rt 3D 50/50 2 25/25 6X, 10X  Extremity: Ext_Rt_Bst 3D 16/16 2 8/8 6X    Narrative:  The patient returns today for routine follow-up and to review recent imaging. She was last seen here for follow-up on 04/30/23.   Her most recent MRI of the femur on 10/30/23 shows: ***.   No other significant oncologic interval history since the patient was last seen for follow-up.   ***                              Allergies:  is allergic to nsaids, oxycodone-acetaminophen, aspirin, augmentin [amoxicillin-pot clavulanate], ciprocin-fluocin-procin [fluocinolone], ciprofloxacin hcl, fentanyl, fluclorolone, lyrica [pregabalin], oxycodone, versed [midazolam], diclofenac sodium, hydromorphone hcl, and voltaren gel [diclofenac sodium].  Meds: Current Outpatient Medications  Medication Sig Dispense Refill   acetaminophen (TYLENOL) 500 MG tablet Take 500 mg by mouth every 4 (four) hours as needed.     amLODipine (NORVASC) 5 MG tablet Take 1 tablet (5 mg total) by mouth daily. 90 tablet 3   b complex vitamins capsule Take 1 capsule by mouth daily.     Calcium Carb-Cholecalciferol 600-200 MG-UNIT TABS Take 1 tablet by mouth daily.      cephALEXin (KEFLEX) 250 MG capsule Take 250 mg by mouth daily.     cholecalciferol (VITAMIN D) 1000 UNITS tablet Take 2,000 Units by mouth daily.     gabapentin (NEURONTIN) 300 MG capsule Take 600-900 mg by mouth See admin instructions. Take 300 mg every  morning and900 mg at bedtime     hydrALAZINE (APRESOLINE) 25 MG tablet TAKE 1 TABLET BY MOUTH TWICE DAILY. 180 tablet 1   HYDROcodone-acetaminophen (NORCO/VICODIN) 5-325 MG tablet Take 1 tablet by mouth every 4 (four) hours as needed for moderate pain or severe pain.      ipratropium (ATROVENT) 0.06 % nasal spray PLACE 2 SPRAYS IN EACH NOSTRIL 3 TIMES A DAY 15 mL 1   levothyroxine (SYNTHROID) 137 MCG tablet TAKE 1 TABLET BY MOUTH DAILY BEFORE BREAKFAST. 90 tablet 0   loratadine-pseudoephedrine (CLARITIN-D 12-HOUR) 5-120 MG per tablet Take 1 tablet by mouth daily.     methocarbamol (ROBAXIN) 750 MG tablet Take 750 mg by mouth 2 (two) times daily as needed for muscle spasms.     metoprolol succinate (TOPROL-XL) 50 MG 24 hr tablet TAKE 1 TABLET BY MOUTH 2 TIMES DAILY. 180 tablet 1   olmesartan (BENICAR) 40 MG tablet TAKE 1 TABLET BY MOUTH DAILY. 90 tablet 1   polyethylene glycol (MIRALAX) 17 g packet Take 17 g by mouth daily. 14 each 0   Soybean-Soy Isoflavones (SOY BALANCE PO) Take 1 tablet by mouth daily. Soy bean extract     Vibegron (GEMTESA) 75 MG TABS Take 75 mg by mouth at bedtime.     zolpidem (AMBIEN) 5 MG tablet TAKE 1 TABLET BY MOUTH ONCE DAILY AT BEDTIME 90 tablet 1   No current facility-administered medications for  this encounter.    Physical Findings: The patient is in no acute distress. Patient is alert and oriented.  vitals were not taken for this visit. .  No significant changes. Lungs are clear to auscultation bilaterally. Heart has regular rate and rhythm. No palpable cervical, supraclavicular, or axillary adenopathy. Abdomen soft, non-tender, normal bowel sounds.   Lab Findings: Lab Results  Component Value Date   WBC 13.1 (H) 04/15/2022   HGB 12.1 04/15/2022   HCT 35.6 (L) 04/15/2022   MCV 100.3 (H) 04/15/2022   PLT 344 04/15/2022    Radiographic Findings: No results found.  Impression:  Stage IIIB (pT3, cN0) Liposarcoma of the Right Thigh, Grade 3    The  patient is recovering from the effects of radiation.  ***  Plan:  ***   *** minutes of total time was spent for this patient encounter, including preparation, face-to-face counseling with the patient and coordination of care, physical exam, and documentation of the encounter. ____________________________________  Billie Lade, PhD, MD  This document serves as a record of services personally performed by Antony Blackbird, MD. It was created on his behalf by Neena Rhymes, a trained medical scribe. The creation of this record is based on the scribe's personal observations and the provider's statements to them. This document has been checked and approved by the attending provider.

## 2023-11-02 ENCOUNTER — Ambulatory Visit
Admission: RE | Admit: 2023-11-02 | Discharge: 2023-11-02 | Disposition: A | Discharge status: Home / Self Care | Payer: Medicare Other | Source: Ambulatory Visit | Attending: Radiation Oncology | Admitting: Radiation Oncology

## 2023-11-02 ENCOUNTER — Encounter: Payer: Self-pay | Admitting: Radiation Oncology

## 2023-11-02 VITALS — BP 144/57 | HR 64 | Temp 97.7°F | Resp 20 | Ht 61.0 in | Wt 157.2 lb

## 2023-11-02 DIAGNOSIS — C4921 Malignant neoplasm of connective and soft tissue of right lower limb, including hip: Secondary | ICD-10-CM

## 2023-11-02 DIAGNOSIS — M25461 Effusion, right knee: Secondary | ICD-10-CM | POA: Diagnosis not present

## 2023-11-02 DIAGNOSIS — M6250 Muscle wasting and atrophy, not elsewhere classified, unspecified site: Secondary | ICD-10-CM | POA: Diagnosis not present

## 2023-11-02 DIAGNOSIS — Z923 Personal history of irradiation: Secondary | ICD-10-CM | POA: Insufficient documentation

## 2023-11-02 DIAGNOSIS — M7061 Trochanteric bursitis, right hip: Secondary | ICD-10-CM | POA: Diagnosis not present

## 2023-11-02 DIAGNOSIS — M25462 Effusion, left knee: Secondary | ICD-10-CM | POA: Insufficient documentation

## 2023-11-02 DIAGNOSIS — Z79890 Hormone replacement therapy (postmenopausal): Secondary | ICD-10-CM | POA: Insufficient documentation

## 2023-11-02 DIAGNOSIS — Z8589 Personal history of malignant neoplasm of other organs and systems: Secondary | ICD-10-CM | POA: Insufficient documentation

## 2023-11-02 DIAGNOSIS — Z79899 Other long term (current) drug therapy: Secondary | ICD-10-CM | POA: Diagnosis not present

## 2023-11-02 NOTE — Progress Notes (Addendum)
[name] returns today for routine follow up to right thigh and to get CT results to right thigh.     They completed their radiation on: 08/12/2019  Does the patient complain of any of the following:  Pain: Denies pain Swelling:Reports lymphedema from ankle all the way up to pelvic area. Skin: She reports no issues  Ambulation: She walks without assistance but reports that she cannot bend  her knee due to the lymphedema.  BP (!) 144/57 (BP Location: Right Arm, Patient Position: Sitting, Cuff Size: Normal)   Pulse 64   Temp 97.7 F (36.5 C)   Resp 20   Ht 5\' 1"  (1.549 m)   Wt 157 lb 3.2 oz (71.3 kg)   SpO2 96%   BMI 29.70 kg/m

## 2023-11-04 DIAGNOSIS — R350 Frequency of micturition: Secondary | ICD-10-CM | POA: Diagnosis not present

## 2023-11-04 DIAGNOSIS — N3020 Other chronic cystitis without hematuria: Secondary | ICD-10-CM | POA: Diagnosis not present

## 2023-11-07 ENCOUNTER — Other Ambulatory Visit: Payer: Self-pay | Admitting: Family Medicine

## 2023-11-09 ENCOUNTER — Ambulatory Visit: Payer: Medicare Other | Admitting: Podiatry

## 2023-11-09 ENCOUNTER — Encounter: Payer: Self-pay | Admitting: Podiatry

## 2023-11-09 ENCOUNTER — Ambulatory Visit: Payer: Medicare Other | Admitting: Rehabilitation

## 2023-11-09 DIAGNOSIS — M79675 Pain in left toe(s): Secondary | ICD-10-CM | POA: Diagnosis not present

## 2023-11-09 DIAGNOSIS — M79674 Pain in right toe(s): Secondary | ICD-10-CM

## 2023-11-09 DIAGNOSIS — B351 Tinea unguium: Secondary | ICD-10-CM | POA: Diagnosis not present

## 2023-11-09 DIAGNOSIS — L603 Nail dystrophy: Secondary | ICD-10-CM

## 2023-11-09 NOTE — Progress Notes (Signed)
Subjective:  Patient ID: Destiny [name], female    DOB: 1941/05/22,  MRN: [id number]  Chief Complaint  Patient presents with   Nail Problem    "I'm concerned about the big toenails, especially the right one."    Discussed the use of AI scribe software for clinical note transcription with the patient, who gave verbal consent to proceed.  History of Present Illness   The patient, with a history of arthritis and previous foot surgeries, presents with concerns about her big toes, particularly the right one. She reports that the toe has been rigid for a while, causing discomfort when rubbing against shoes. The only footwear that doesn't cause discomfort are sandals, but the patient reports feeling unstable when wearing them. The right big toe is also rising up and has been bleeding slightly on either side. The patient has been receiving pedicures at a local shop for the past four to five years, but after the use of a Dremel tool during the last visit, the toe hasn't looked right.  The patient also reports numbness in the toes, with the right toe being less sensitive than the left. She has been applying Vaseline and a band-aid to the right toe to soften it. The patient also mentions difficulty in reaching her feet due to stiffness in the right knee from a previous cancer.  The patient has also had surgery on both feet and has been experiencing pain in the arch of the left foot. She also reports a corn that developed earlier in the year between two webbed toes on the left foot, which was painful but has since been removed and has not returned.          Objective:    Physical Exam   MUSCULOSKELETAL: Right big toenail exhibits dystrophic growth with thickening and slight bleeding on either side. Left big toenail more sensitive to palpation. Multiple digital contractures and deformities present. Healed surgical scar on medial arch of left foot. SKIN: Bilateral hallux nails show thickening and  dystrophic yellow mycotic growth.       No images are attached to the encounter.    Results   Procedure: Nail debridement Description: Debridement of the nails with a sharp nail nipper and a rotary burr to a comfortable level. Significant thickening and dystrophic growth observed in bilateral hallux.  RADIOLOGY Foot X-ray: No surgical intervention needed (07/2022)      Assessment:   1. Nail dystrophy   2. Pain due to onychomycosis of toenails of both feet      Plan:  Patient was evaluated and treated and all questions answered.  Assessment and Plan    Arthritis   She experiences discomfort and difficulty with mobility due to arthritis noted in her fingers and feet. We will continue her current management.  Onychomycosis   Her toenails are thickened and dystrophic, likely secondary to a fungal infection, causing discomfort and difficulty with footwear. Today, we debrided her nails using a sharp nail nipper and rotary burr. We recommended the daily use of urea 40% gel to soften the nail plate and advised continuing regular pedicures for maintenance. She will follow up as needed.  Foot Deformities   She has multiple digital contractures and deformities in her feet, with previous surgical scars noted. We will continue her current management.  Pain in Left Arch   She reports recent pain in the arch of her left foot, which has a history of surgery. We will continue her current management.  No follow-ups on file.

## 2023-11-09 NOTE — Patient Instructions (Signed)
VISIT SUMMARY:  Today, we discussed your concerns about your big toes, particularly the right one, and other foot-related issues. We addressed the discomfort and rigidity in your right big toe, the numbness in your toes, and the pain in the arch of your left foot. We also reviewed your history of arthritis and previous foot surgeries.  YOUR PLAN:     -ONYCHOMYCOSIS: Onychomycosis is a fungal infection of the toenails that causes them to thicken and become distorted. Today, we trimmed and smoothed your nails. We recommend using urea 40% gel daily to soften the nails and continuing regular pedicures for maintenance.  -FOOT DEFORMITIES: Foot deformities involve abnormal shapes or positions of the toes and feet, often due to previous surgeries or conditions. We will continue with your current management plan to address these issues.     INSTRUCTIONS:  Please continue using urea 40% gel daily on your toenails and maintain regular pedicures. Follow up as needed if you experience any new or worsening symptoms.

## 2023-11-10 ENCOUNTER — Ambulatory Visit: Payer: Medicare Other | Attending: Radiation Oncology | Admitting: Rehabilitation

## 2023-11-10 DIAGNOSIS — I890 Lymphedema, not elsewhere classified: Secondary | ICD-10-CM | POA: Insufficient documentation

## 2023-11-10 NOTE — Telephone Encounter (Signed)
error 

## 2023-11-11 NOTE — Therapy (Signed)
Remeasured for custom tribute night and sent new measurements to [employer] [facility] in [employer] [place] to [name].   Spoke with patient who would like to make the 1/2 leg portion longer and the foot to have velcro closure.   No visit charge.

## 2023-11-16 ENCOUNTER — Other Ambulatory Visit: Payer: Self-pay | Admitting: Family Medicine

## 2023-12-02 DIAGNOSIS — G894 Chronic pain syndrome: Secondary | ICD-10-CM | POA: Diagnosis not present

## 2023-12-02 DIAGNOSIS — M25551 Pain in right hip: Secondary | ICD-10-CM | POA: Diagnosis not present

## 2023-12-02 DIAGNOSIS — M79604 Pain in right leg: Secondary | ICD-10-CM | POA: Diagnosis not present

## 2023-12-02 DIAGNOSIS — Z79891 Long term (current) use of opiate analgesic: Secondary | ICD-10-CM | POA: Diagnosis not present

## 2024-01-08 DIAGNOSIS — I890 Lymphedema, not elsewhere classified: Secondary | ICD-10-CM | POA: Diagnosis not present

## 2024-01-18 ENCOUNTER — Other Ambulatory Visit: Payer: Self-pay | Admitting: Family Medicine

## 2024-01-20 ENCOUNTER — Ambulatory Visit (INDEPENDENT_AMBULATORY_CARE_PROVIDER_SITE_OTHER): Payer: Medicare Other

## 2024-01-20 VITALS — Ht 61.0 in | Wt 154.0 lb

## 2024-01-20 DIAGNOSIS — Z0000 Encounter for general adult medical examination without abnormal findings: Secondary | ICD-10-CM | POA: Diagnosis not present

## 2024-01-20 NOTE — Patient Instructions (Addendum)
 Destiny [name] , Thank you for taking time to come for your Medicare Wellness Visit. I appreciate your ongoing commitment to your health goals. Please review the following plan we discussed and let me know if I can assist you in the future.   Referrals/Orders/Follow-Ups/Clinician Recommendations:   This is a list of the screening recommended for you and due dates:  Health Maintenance  Topic Date Due   Zoster (Shingles) Vaccine (1 of 2) 01/27/1960   DEXA scan (bone density measurement)  Never done   DTaP/Tdap/Td vaccine (3 - Td or Tdap) 06/29/2023   COVID-19 Vaccine (6 - 2024-25 season) 10/08/2023   Medicare Annual Wellness Visit  01/19/2025   Pneumonia Vaccine  Completed   Flu Shot  Completed   HPV Vaccine  Aged Out   Opioid Pain Medicine Management Opioids are powerful medicines that are used to treat moderate to severe pain. When used for short periods of time, they can help you to: Sleep better. Do better in physical or occupational therapy. Feel better in the first few days after an injury. Recover from surgery. Opioids should be taken with the supervision of a trained health care provider. They should be taken for the shortest period of time possible. This is because opioids can be addictive, and the longer you take opioids, the greater your risk of addiction. This addiction can also be called opioid use disorder. What are the risks? Using opioid pain medicines for longer than 3 days increases your risk of side effects. Side effects include: Constipation. Nausea and vomiting. Breathing difficulties (respiratory depression). Drowsiness. Confusion. Opioid use disorder. Itching. Taking opioid pain medicine for a long period of time can affect your ability to do daily tasks. It also puts you at risk for: Motor vehicle crashes. Depression. Suicide. Heart attack. Overdose, which can be life-threatening. What is a pain treatment plan? A pain treatment plan is an agreement between you  and your health care provider. Pain is unique to each person, and treatments vary depending on your condition. To manage your pain, you and your health care provider need to work together. To help you do this: Discuss the goals of your treatment, including how much pain you might expect to have and how you will manage the pain. Review the risks and benefits of taking opioid medicines. Remember that a good treatment plan uses more than one approach and minimizes the chance of side effects. Be honest about the amount of medicines you take and about any drug or alcohol use. Get pain medicine prescriptions from only one health care provider. Pain can be managed with many types of alternative treatments. Ask your health care provider to refer you to one or more specialists who can help you manage pain through: Physical or occupational therapy. Counseling (cognitive behavioral therapy). Good nutrition. Biofeedback. Massage. Meditation. Non-opioid medicine. Following a gentle exercise program. How to use opioid pain medicine Taking medicine Take your pain medicine exactly as told by your health care provider. Take it only when you need it. If your pain gets less severe, you may take less than your prescribed dose if your health care provider approves. If you are not having pain, do nottake pain medicine unless your health care provider tells you to take it. If your pain is severe, do nottry to treat it yourself by taking more pills than instructed on your prescription. Contact your health care provider for help. Write down the times when you take your pain medicine. It is easy to become confused  while on pain medicine. Writing the time can help you avoid overdose. Take other over-the-counter or prescription medicines only as told by your health care provider. Keeping yourself and others safe  While you are taking opioid pain medicine: Do not drive, use machinery, or power tools. Do not sign legal  documents. Do not drink alcohol. Do not take sleeping pills. Do not supervise children by yourself. Do not do activities that require climbing or being in high places. Do not go to a lake, river, ocean, spa, or swimming pool. Do not share your pain medicine with anyone. Keep pain medicine in a locked cabinet or in a secure area where pets and children cannot reach it. Stopping your use of opioids If you have been taking opioid medicine for more than a few weeks, you may need to slowly decrease (taper) how much you take until you stop completely. Tapering your use of opioids can decrease your risk of symptoms of withdrawal, such as: Pain and cramping in the abdomen. Nausea. Sweating. Sleepiness. Restlessness. Uncontrollable shaking (tremors). Cravings for the medicine. Do not attempt to taper your use of opioids on your own. Talk with your health care provider about how to do this. Your health care provider may prescribe a step-down schedule based on how much medicine you are taking and how long you have been taking it. Getting rid of leftover pills Do not save any leftover pills. Get rid of leftover pills safely by: Taking the medicine to a prescription take-back program. This is usually offered by the county or law enforcement. Bringing them to a pharmacy that has a drug disposal container. Flushing them down the toilet. Check the label or package insert of your medicine to see whether this is safe to do. Throwing them out in the trash. Check the label or package insert of your medicine to see whether this is safe to do. If it is safe to throw it out, remove the medicine from the original container, put it into a sealable bag or container, and mix it with used coffee grounds, food scraps, dirt, or cat litter before putting it in the trash. Follow these instructions at home: Activity Do exercises as told by your health care provider. Avoid activities that make your pain worse. Return to  your normal activities as told by your health care provider. Ask your health care provider what activities are safe for you. General instructions You may need to take these actions to prevent or treat constipation: Drink enough fluid to keep your urine pale yellow. Take over-the-counter or prescription medicines. Eat foods that are high in fiber, such as beans, whole grains, and fresh fruits and vegetables. Limit foods that are high in fat and processed sugars, such as fried or sweet foods. Keep all follow-up visits. This is important. Where to find support If you have been taking opioids for a long time, you may benefit from receiving support for quitting from a local support group or counselor. Ask your health care provider for a referral to these resources in your area. Where to find more information Centers for Disease Control and Prevention (CDC): FootballExhibition.com.br [place] Food and Drug Administration (FDA): PumpkinSearch.com.ee Get help right away if: You may have taken too much of an opioid (overdosed). Common symptoms of an overdose: Your breathing is slower or more shallow than normal. You have a very slow heartbeat (pulse). You have slurred speech. You have nausea and vomiting. Your pupils become very small. You have other potential symptoms: You are  very confused. You faint or feel like you will faint. You have cold, clammy skin. You have blue lips or fingernails. You have thoughts of harming yourself or harming others. These symptoms may represent a serious problem that is an emergency. Do not wait to see if the symptoms will go away. Get medical help right away. Call your local emergency services (911 in the [place] Do not drive yourself to the hospital.  If you ever feel like you may hurt yourself or others, or have thoughts about taking your own life, get help right away. Go to your nearest emergency department or: Call your local emergency services (911 in the [place] Call the [facility] (702-084-2583 in the [place] Call a suicide crisis helpline, such as the National Suicide Prevention Lifeline at 9795040967 or 988 in the [place] This is open 24 hours a day in the [place] If you're a Veteran: Call 988 and press 1. This is open 24 hours a day. Text the [employer] at (213) 856-7324. Summary Opioid medicines can help you manage moderate to severe pain for a short period of time. A pain treatment plan is an agreement between you and your health care provider. Discuss the goals of your treatment, including how much pain you might expect to have and how you will manage the pain. If you think that you or someone else may have taken too much of an opioid, get medical help right away. This information is not intended to replace advice given to you by your health care provider. Make sure you discuss any questions you have with your health care provider. Document Revised: 08/17/2023 Document Reviewed: 02/20/2021 [employer] Patient Education  2024 [employer] Advanced directives: (Copy Requested) Please bring a copy of your health care power of attorney and living will to the office to be added to your chart at your convenience.  Next Medicare Annual Wellness Visit scheduled for next year: Yes

## 2024-01-20 NOTE — Progress Notes (Signed)
 Subjective:   Destiny [name] is a 83 y.o. female who presents for Medicare Annual (Subsequent) preventive examination.  Visit Complete: Virtual I connected with  Silvana Newness Federer on 01/20/24 by a audio enabled telemedicine application and verified that I am speaking with the correct person using two identifiers.  Patient Location: Home  Provider Location: Home Office  I discussed the limitations of evaluation and management by telemedicine. The patient expressed understanding and agreed to proceed.  Vital Signs: Because this visit was a virtual/telehealth visit, some criteria may be missing or patient reported. Any vitals not documented were not able to be obtained and vitals that have been documented are patient reported.    Cardiac Risk Factors include: advanced age (>41men, >67 women);hypertension     Objective:    Today's Vitals   01/20/24 1546  Weight: 154 lb (69.9 kg)  Height: 5\' 1"  (1.549 m)   Body mass index is 29.1 kg/m.     01/20/2024    3:55 PM 11/02/2023   11:50 AM 12/23/2022   12:41 PM 10/23/2022    9:59 AM 05/08/2022   10:09 AM 04/17/2022    3:07 PM 12/20/2021    1:19 PM  Advanced Directives  Does Patient Have a Medical Advance Directive? Yes Yes Yes Yes Yes Yes Yes  Type of Estate agent of [place];Living will Healthcare Power of [place];Living will Healthcare Power of [place];Living will Healthcare Power of [place];Living will   Healthcare Power of [place];Living will  Does patient want to make changes to medical advance directive?      No - Patient declined No - Patient declined  Copy of Healthcare Power of Attorney in Chart? No - copy requested  No - copy requested No - copy requested   No - copy requested    Current Medications (verified) Outpatient Encounter Medications as of 01/20/2024  Medication Sig   acetaminophen (TYLENOL) 500 MG tablet Take 500 mg by mouth every 4 (four) hours as needed.   amLODipine (NORVASC)  5 MG tablet Take 1 tablet (5 mg total) by mouth daily.   b complex vitamins capsule Take 1 capsule by mouth daily.   Calcium Carb-Cholecalciferol 600-200 MG-UNIT TABS Take 1 tablet by mouth daily.    cephALEXin (KEFLEX) 250 MG capsule Take 250 mg by mouth daily.   cholecalciferol (VITAMIN D) 1000 UNITS tablet Take 2,000 Units by mouth daily.   gabapentin (NEURONTIN) 300 MG capsule Take 600-900 mg by mouth See admin instructions. Take 300 mg every morning and900 mg at bedtime   hydrALAZINE (APRESOLINE) 25 MG tablet TAKE 1 TABLET BY MOUTH TWICE DAILY.   HYDROcodone-acetaminophen (NORCO/VICODIN) 5-325 MG tablet Take 1 tablet by mouth every 4 (four) hours as needed for moderate pain or severe pain.    ipratropium (ATROVENT) 0.06 % nasal spray PLACE 2 SPRAYS IN EACH NOSTRIL 3 TIMES A DAY   levothyroxine (SYNTHROID) 137 MCG tablet TAKE 1 TABLET BY MOUTH DAILY BEFORE BREAKFAST.   loratadine-pseudoephedrine (CLARITIN-D 12-HOUR) 5-120 MG per tablet Take 1 tablet by mouth daily.   methocarbamol (ROBAXIN) 750 MG tablet Take 750 mg by mouth 2 (two) times daily as needed for muscle spasms.   metoprolol succinate (TOPROL-XL) 50 MG 24 hr tablet TAKE 1 TABLET BY MOUTH 2 TIMES DAILY.   olmesartan (BENICAR) 40 MG tablet TAKE 1 TABLET BY MOUTH DAILY.   polyethylene glycol (MIRALAX) 17 g packet Take 17 g by mouth daily.   Soybean-Soy Isoflavones (SOY BALANCE PO) Take 1 tablet by mouth  daily. Soy bean extract   Vibegron (GEMTESA) 75 MG TABS Take 75 mg by mouth at bedtime.   zolpidem (AMBIEN) 5 MG tablet TAKE 1 TABLET BY MOUTH ONCE DAILY AT BEDTIME   No facility-administered encounter medications on file as of 01/20/2024.    Allergies (verified) Nsaids, Oxycodone-acetaminophen, Aspirin, Augmentin [amoxicillin-pot clavulanate], Ciprocin-fluocin-procin [fluocinolone], Ciprofloxacin hcl, Fentanyl, Fluclorolone, Lyrica [pregabalin], Oxycodone, Versed [midazolam], Diclofenac sodium, Hydromorphone hcl, and Voltaren gel  [diclofenac sodium]   History: Past Medical History:  Diagnosis Date   Anxiety    Arthritis    Colon polyps    Complication of anesthesia    BP drops with fentyl and versed   Fatty liver    Hepatic lesion    Hepatitis    hep A  1976   History of hepatitis A    1976   History of radiation therapy    Right thigh- 07/24/19-08/12/19- Dr. Antony Blackbird   Hypertension    Hypothyroidism    Internal hemorrhoids    Liposarcoma of right thigh (HCC) 03/2019   Past Surgical History:  Procedure Laterality Date   ABDOMINAL HYSTERECTOMY     APPENDECTOMY     BACK SURGERY      fusion   CESAREAN SECTION     times 2   COLONOSCOPY     EYE SURGERY Left    cataract removed   FINGER ARTHRODESIS Left 10/02/2017   Procedure: LEFT LONG FINGER ARTHRODESIS;  Surgeon: Mack Hook, MD;  Location: [facility];  Service: Orthopedics;  Laterality: Left;   FOOT SURGERY Bilateral    FRACTURE SURGERY     broken ankle   MEDIAL PARTIAL KNEE REPLACEMENT Left    done at [facility]   REVERSE SHOULDER ARTHROPLASTY Right 12/28/2014   Procedure: REVERSE SHOULDER ARTHROPLASTY;  Surgeon: Mable Paris, MD;  Location: [facility] OR;  Service: Orthopedics;  Laterality: Right;  Right reverse total shoulder arthroplasty   REVERSE TOTAL SHOULDER ARTHROPLASTY Right 12/28/2014   DR CHANDLER   rotator cufff     THYROIDECTOMY, PARTIAL     one lobe removed   VARICOSE VEIN SURGERY Right    Family History  Problem Relation Age of Onset   Heart disease Mother    Atrial fibrillation Mother    Cancer - Other Father    Pancreatic cancer Father    Pancreatic cancer Maternal Uncle    Colon cancer Neg Hx    Prostate cancer Neg Hx    Rectal cancer Neg Hx    Stomach cancer Neg Hx    Social History   Socioeconomic History   Marital status: Married    Spouse name: Not on file   Number of children: Not on file   Years of education: Not on file   Highest education level: Not on file  Occupational History    Occupation: retired    Comment: Charity fundraiser  Tobacco Use   Smoking status: Never   Smokeless tobacco: Never  Substance and Sexual Activity   Alcohol use: Yes    Comment: daily   Drug use: No   Sexual activity: Not Currently  Other Topics Concern   Not on file  Social History Narrative   Married   HH 2   Lives in 1 story ranch in [place] and also has mountain home in [place] with stairs   Social Drivers of Health   Financial Resource Strain: Low Risk  (01/20/2024)   Overall Financial Resource Strain (CARDIA)    Difficulty of Paying Living Expenses:  Not hard at all  Food Insecurity: No Food Insecurity (01/20/2024)   Hunger Vital Sign    Worried About Running Out of Food in the Last Year: Never true    Ran Out of Food in the Last Year: Never true  Transportation Needs: No Transportation Needs (01/20/2024)   PRAPARE - Administrator, Civil Service (Medical): No    Lack of Transportation (Non-Medical): No  Physical Activity: Insufficiently Active (01/20/2024)   Exercise Vital Sign    Days of Exercise per Week: 3 days    Minutes of Exercise per Session: 30 min  Stress: No Stress Concern Present (01/20/2024)   [employer] of Occupational Health - Occupational Stress Questionnaire    Feeling of Stress : Not at all  Social Connections: Moderately Isolated (01/20/2024)   Social Connection and Isolation Panel [NHANES]    Frequency of Communication with Friends and Family: More than three times a week    Frequency of Social Gatherings with Friends and Family: More than three times a week    Attends Religious Services: Never    Database administrator or Organizations: No    Attends Engineer, structural: Never    Marital Status: Married    Tobacco Counseling Counseling given: Not Answered   Clinical Intake:  Pre-visit preparation completed: Yes  Pain : No/denies pain     BMI - recorded: 29.1 Nutritional Status: BMI 25 -29 Overweight Nutritional  Risks: None Diabetes: No  How often do you need to have someone help you when you read instructions, pamphlets, or other written materials from your doctor or pharmacy?: 1 - Never  Interpreter Needed?: No  Information entered by :: Theresa Mulligan LPN   Activities of Daily Living    01/20/2024    3:53 PM  In your present state of health, do you have any difficulty performing the following activities:  Hearing? 1  Comment Wears Hearing Aids  Vision? 0  Difficulty concentrating or making decisions? 0  Walking or climbing stairs? 0  Dressing or bathing? 0  Doing errands, shopping? 0  Preparing Food and eating ? N  Using the Toilet? N  In the past six months, have you accidently leaked urine? Y  Comment Followed by medical attention  Do you have problems with loss of bowel control? N  Managing your Medications? N  Managing your Finances? N  Housekeeping or managing your Housekeeping? N    Patient Care Team: Kristian Covey, MD as PCP - General (Family Medicine) Verner Chol, [facility] (Inactive) as Pharmacist (Pharmacist)  Indicate any recent Medical Services you may have received from other than [facility] providers in the past year (date may be approximate).     Assessment:   This is a routine wellness examination for Asjah.  Hearing/Vision screen Hearing Screening - Comments:: Wears Hearing Aids Vision Screening - Comments:: Wears rx glasses - up to date with routine eye exams with  [facility]   Goals Addressed               This Visit's Progress     Stay active (pt-stated)        Stay in my home.       Depression Screen    01/20/2024    3:52 PM 12/23/2022   12:39 PM 12/20/2021    1:07 PM 01/15/2021   10:40 AM 01/09/2020    3:42 PM 10/05/2019    9:17 AM 01/13/2017    2:56 PM  PHQ 2/9  Scores  PHQ - 2 Score 0 0 0 0 1 0 0  PHQ- 9 Score      2     Fall Risk    01/20/2024    3:54 PM 12/23/2022   12:41 PM 12/20/2021    1:10 PM 01/15/2021   10:39 AM 01/09/2020     3:48 PM  Fall Risk   Falls in the past year? 0 0 1 0 0  Number falls in past yr: 0 0 0 0   Injury with Fall? 0 0 1 0   Comment   Fall injury skin tear to left arm.recieved.medical attention    Risk for fall due to : No Fall Risks No Fall Risks  No Fall Risks Medication side effect;Orthopedic patient;Other (Comment)  Risk for fall due to: Comment     recent cancer diagnosis in 2020  Follow up Falls prevention discussed;Falls evaluation completed Falls prevention discussed  Falls evaluation completed;Falls prevention discussed Falls evaluation completed;Education provided;Falls prevention discussed    MEDICARE RISK AT HOME: Medicare Risk at Home Any stairs in or around the home?: No If so, are there any without handrails?: No Home free of loose throw rugs in walkways, pet beds, electrical cords, etc?: Yes Adequate lighting in your home to reduce risk of falls?: Yes Life alert?: No Use of a cane, walker or w/c?: No Grab bars in the bathroom?: Yes Shower chair or bench in shower?: No Elevated toilet seat or a handicapped toilet?: No  TIMED UP AND GO:  Was the test performed?  No    Cognitive Function:        01/20/2024    3:55 PM 12/23/2022   12:42 PM 12/20/2021    1:17 PM 01/09/2020    3:49 PM  6CIT Screen  What Year? 0 points 0 points 0 points 0 points  What month? 0 points 0 points 0 points 0 points  What time? 0 points 0 points 0 points 0 points  Count back from 20 0 points 0 points 0 points 0 points  Months in reverse 0 points 0 points 0 points 0 points  Repeat phrase 0 points 0 points 0 points 0 points  Total Score 0 points 0 points 0 points 0 points    Immunizations Immunization History  Administered Date(s) Administered   Fluad Quad(high Dose 65+) 07/29/2019, 09/22/2022   Influenza Whole 09/08/2008   Influenza, High Dose Seasonal PF 09/25/2014, 08/09/2015, 08/12/2016   Influenza, Quadrivalent, Recombinant, Inj, Pf 08/19/2017, 08/26/2018   Influenza-Unspecified  08/22/2020, 09/08/2021, 08/13/2023   Moderna Covid-19 Vaccine Bivalent Booster 107yrs & up 08/23/2022   Moderna Sars-Covid-2 Vaccination 12/07/2019, 01/05/2020   PFIZER SARS-COV-2 Pediatric Vaccination 5-66yrs 09/24/2020   PFIZER(Purple Top)SARS-COV-2 Vaccination 04/29/2021   Pfizer(Comirnaty)Fall Seasonal Vaccine 12 years and older 08/13/2023   Pneumococcal Conjugate-13 06/28/2013   Pneumococcal Polysaccharide-23 11/25/2004   Td 11/25/2004   Tdap 06/28/2013   Zoster, Live 07/15/2011    TDAP status: Due, Education has been provided regarding the importance of this vaccine. Advised may receive this vaccine at local pharmacy or Health Dept. Aware to provide a copy of the vaccination record if obtained from local pharmacy or Health Dept. Verbalized acceptance and understanding.  Flu Vaccine status: Up to date  Pneumococcal vaccine status: Up to date  Covid-19 vaccine status: Declined, Education has been provided regarding the importance of this vaccine but patient still declined. Advised may receive this vaccine at local pharmacy or Health Dept.or vaccine clinic. Aware to provide a copy of the  vaccination record if obtained from local pharmacy or Health Dept. Verbalized acceptance and understanding.  Qualifies for Shingles Vaccine? Yes   Zostavax completed No   Shingrix Completed?: No.    Education has been provided regarding the importance of this vaccine. Patient has been advised to call insurance company to determine out of pocket expense if they have not yet received this vaccine. Advised may also receive vaccine at local pharmacy or Health Dept. Verbalized acceptance and understanding.  Screening Tests Health Maintenance  Topic Date Due   Zoster Vaccines- Shingrix (1 of 2) 01/27/1960   DEXA SCAN  Never done   DTaP/Tdap/Td (3 - Td or Tdap) 06/29/2023   COVID-19 Vaccine (6 - 2024-25 season) 10/08/2023   Medicare Annual Wellness (AWV)  01/19/2025   Pneumonia Vaccine 34+ Years old   Completed   INFLUENZA VACCINE  Completed   HPV VACCINES  Aged Out    Health Maintenance  Health Maintenance Due  Topic Date Due   Zoster Vaccines- Shingrix (1 of 2) 01[birth year]27/1960   DEXA SCAN  Never done   DTaP/Tdap/Td (3 - Td or Tdap) 06/29/2023   COVID-19 Vaccine (6 - 2024-25 season) 10/08/2023        Bone Density status: Ordered Deferred. Pt provided with contact info and advised to call to schedule appt.     Additional Screening:    Vision Screening: Recommended annual ophthalmology exams for early detection of glaucoma and other disorders of the eye. Is the patient up to date with their annual eye exam?  Yes  Who is the provider or what is the name of the office in which the patient attends annual eye exams? [facility] If pt is not established with a provider, would they like to be referred to a provider to establish care? No .   Dental Screening: Recommended annual dental exams for proper oral hygiene    Community Resource Referral / Chronic Care Management:  CRR required this visit?  No   CCM required this visit?  No     Plan:     I have personally reviewed and noted the following in the patient's chart:   Medical and social history Use of alcohol, tobacco or illicit drugs  Current medications and supplements including opioid prescriptions. Patient is currently taking opioid prescriptions. Information provided to patient regarding non-opioid alternatives. Patient advised to discuss non-opioid treatment plan with their provider. Functional ability and status Nutritional status Physical activity Advanced directives List of other physicians Hospitalizations, surgeries, and ER visits in previous 12 months Vitals Screenings to include cognitive, depression, and falls Referrals and appointments  In addition, I have reviewed and discussed with patient certain preventive protocols, quality metrics, and best practice recommendations. A written personalized  care plan for preventive services as well as general preventive health recommendations were provided to patient.     Tillie Rung, LPN   01/29/6577   After Visit Summary: (MyChart) Due to this being a telephonic visit, the after visit summary with patients personalized plan was offered to patient via MyChart   Nurse Notes: None

## 2024-02-03 DIAGNOSIS — G894 Chronic pain syndrome: Secondary | ICD-10-CM | POA: Diagnosis not present

## 2024-02-03 DIAGNOSIS — M25551 Pain in right hip: Secondary | ICD-10-CM | POA: Diagnosis not present

## 2024-02-03 DIAGNOSIS — M79604 Pain in right leg: Secondary | ICD-10-CM | POA: Diagnosis not present

## 2024-02-03 DIAGNOSIS — Z79891 Long term (current) use of opiate analgesic: Secondary | ICD-10-CM | POA: Diagnosis not present

## 2024-03-04 ENCOUNTER — Telehealth: Payer: Self-pay

## 2024-03-04 NOTE — Telephone Encounter (Signed)
 Patient declined earlier visit as she prefers to see PCP. Patient reported she doesn't think her symptoms are emergent.

## 2024-03-04 NOTE — Telephone Encounter (Signed)
 Copied from CRM [id number]. Topic: Clinical - Red Word Triage >> Mar 04, 2024 10:33 AM [name] wrote: Red Word that prompted transfer to Nurse Triage: Patient has been experiencing dizziness in the mornings when waking up and it takes a while before she is stable enough to get out of bed, She would like to see Dr.Burchette offered for patient to speak with a triage nurse; however, patient declined. I was able to move her appointment from 03/28/2024 to 03/14/2024 but patient would like to know if there is something sooner.

## 2024-03-14 ENCOUNTER — Ambulatory Visit
Admission: RE | Admit: 2024-03-14 | Discharge: 2024-03-14 | Disposition: A | Discharge status: Home / Self Care | Source: Ambulatory Visit | Attending: Radiation Oncology | Admitting: Radiation Oncology

## 2024-03-14 ENCOUNTER — Other Ambulatory Visit: Payer: Self-pay

## 2024-03-14 ENCOUNTER — Ambulatory Visit (INDEPENDENT_AMBULATORY_CARE_PROVIDER_SITE_OTHER): Admitting: Family Medicine

## 2024-03-14 ENCOUNTER — Encounter: Payer: Self-pay | Admitting: Family Medicine

## 2024-03-14 VITALS — BP 138/60 | HR 69 | Temp 98.0°F | Wt 157.9 lb

## 2024-03-14 DIAGNOSIS — I10 Essential (primary) hypertension: Secondary | ICD-10-CM

## 2024-03-14 DIAGNOSIS — K5900 Constipation, unspecified: Secondary | ICD-10-CM

## 2024-03-14 DIAGNOSIS — C4921 Malignant neoplasm of connective and soft tissue of right lower limb, including hip: Secondary | ICD-10-CM | POA: Insufficient documentation

## 2024-03-14 DIAGNOSIS — E871 Hypo-osmolality and hyponatremia: Secondary | ICD-10-CM | POA: Diagnosis not present

## 2024-03-14 DIAGNOSIS — E039 Hypothyroidism, unspecified: Secondary | ICD-10-CM

## 2024-03-14 LAB — BASIC METABOLIC PANEL - CANCER CENTER ONLY
Anion gap: 6 (ref 5–15)
BUN: 24 mg/dL — ABNORMAL HIGH (ref 8–23)
CO2: 28 mmol/L (ref 22–32)
Calcium: 9.4 mg/dL (ref 8.9–10.3)
Chloride: 97 mmol/L — ABNORMAL LOW (ref 98–111)
Creatinine: 0.84 mg/dL (ref 0.44–1.00)
GFR, Estimated: >60 mL/min (ref >60)
Glucose, Bld: 139 mg/dL — ABNORMAL HIGH (ref 70–99)
Potassium: 4.6 mmol/L (ref 3.5–5.1)
Sodium: 131 mmol/L — ABNORMAL LOW (ref 135–145)

## 2024-03-14 NOTE — Progress Notes (Signed)
 Established Patient Office Visit  Subjective   Patient ID: Destiny [name], female    DOB: 11-02-1941  Age: 83 y.o. MRN: [id number]  Chief Complaint  Patient presents with   Medical Management of Chronic Issues    HPI   Destiny [name] is here to address several items as below.  She has history of hypertension, Raynaud's disease, hypothyroidism, essential tremor, idiopathic peripheral neuropathy, osteoarthritis, history of liposarcoma right thigh, chronic lymphedema right lower extremity.  She has chronic pain and is followed by chronic pain management.  Main complaint today is fatigue.  She thought some of this may be related to her gabapentin  and she is currently on reduced dose of 300 mg in the morning and 600 mg at night.  Having taking 900 mg at night.  Still have poor sleep at times although Destiny [name] has helped significantly with her urgency at night.  She has some chronic insomnia currently on amlodipine  5 mg.  Supplement sometimes with melatonin.  Chronic hyponatremia.  No SSRI or thiazide use.  Last sodium 129.  She does not overly restrict sodium.  Recent constipation issues.  Has taken MiraLAX  in the past but with daily use has recently had some loose stools.  Past Medical History:  Diagnosis Date   Anxiety    Arthritis    Colon polyps    Complication of anesthesia    BP drops with fentyl and versed    Fatty liver    Hepatic lesion    Hepatitis    hep A  1976   History of hepatitis A    1976   History of radiation therapy    Right thigh- 07/24/19-08/12/19- Dr. Retta Caster   Hypertension    Hypothyroidism    Internal hemorrhoids    Liposarcoma of right thigh (HCC) 03/2019   Past Surgical History:  Procedure Laterality Date   ABDOMINAL HYSTERECTOMY     APPENDECTOMY     BACK SURGERY      fusion   CESAREAN SECTION     times 2   COLONOSCOPY     EYE SURGERY Left    cataract removed   FINGER ARTHRODESIS Left 10/02/2017   Procedure: LEFT LONG FINGER ARTHRODESIS;   Surgeon: Rober Chimera, MD;  Location: [facility];  Service: Orthopedics;  Laterality: Left;   FOOT SURGERY Bilateral    FRACTURE SURGERY     broken ankle   MEDIAL PARTIAL KNEE REPLACEMENT Left    done at [facility]   REVERSE SHOULDER ARTHROPLASTY Right 12/28/2014   Procedure: REVERSE SHOULDER ARTHROPLASTY;  Surgeon: Derald Flattery, MD;  Location: [facility] OR;  Service: Orthopedics;  Laterality: Right;  Right reverse total shoulder arthroplasty   REVERSE TOTAL SHOULDER ARTHROPLASTY Right 12/28/2014   DR CHANDLER   rotator cufff     THYROIDECTOMY, PARTIAL     one lobe removed   VARICOSE VEIN SURGERY Right     reports that she has never smoked. She has never used smokeless tobacco. She reports current alcohol use. She reports that she does not use drugs. family history includes Atrial fibrillation in her mother; Cancer - Other in her father; Heart disease in her mother; Pancreatic cancer in her father and maternal uncle. Allergies  Allergen Reactions   Nsaids Other (See Comments)     BRUISES EASILY Other Reaction: BRUISES EASILY Other Reaction: BRUISES EASILY    Oxycodone-Acetaminophen  Other (See Comments)     VERY CONSTIPATING, does not provide pain relief   Aspirin  Other (See Comments)  Bruising    Augmentin [Amoxicillin-Pot Clavulanate] Other (See Comments)    headache   Ciprocin-Fluocin-Procin [Fluocinolone]    Ciprofloxacin  Hcl     tendonitis   Fentanyl  Other (See Comments)    Does not work   Fluclorolone Other (See Comments)   Lyrica [Pregabalin] Other (See Comments)    dysphoria   Oxycodone Other (See Comments)    Does not work   Versed  [Midazolam ] Other (See Comments)    Does not work   Diclofenac Sodium Other (See Comments) and Rash    Skin peeling. Voltaren gel-rash and skin peeling    Hydromorphone  Hcl Itching     Itching with large doses - able to tolerate lower doses or shorter duration   Voltaren Gel [Diclofenac Sodium] Rash and Other  (See Comments)    Skin peeling.    Review of Systems  Constitutional:  Positive for malaise/fatigue. Negative for chills and fever.  Respiratory:  Negative for cough and shortness of breath.   Gastrointestinal:  Positive for constipation. Negative for abdominal pain, blood in stool, nausea and vomiting.  Psychiatric/Behavioral:  The patient has insomnia.       Objective:     BP 138/60 (BP Location: Left Arm, Cuff Size: Normal)   Pulse 69   Temp 98 F (36.7 C) (Oral)   Wt 157 lb 14.4 oz (71.6 kg)   SpO2 97%   BMI 29.83 kg/m  BP Readings from Last 3 Encounters:  03/14/24 138/60  11/02/23 (!) 144/57  09/28/23 138/60   Wt Readings from Last 3 Encounters:  03/14/24 157 lb 14.4 oz (71.6 kg)  01/20/24 154 lb (69.9 kg)  11/02/23 157 lb 3.2 oz (71.3 kg)      Physical Exam Vitals reviewed.  Constitutional:      Appearance: She is well-developed.  Eyes:     Pupils: Pupils are equal, round, and reactive to light.  Neck:     Thyroid : No thyromegaly.     Vascular: No JVD.  Cardiovascular:     Rate and Rhythm: Normal rate and regular rhythm.     Heart sounds:     No gallop.  Pulmonary:     Effort: Pulmonary effort is normal. No respiratory distress.     Breath sounds: Normal breath sounds. No wheezing or rales.  Musculoskeletal:     Cervical back: Neck supple.     Comments: Chronic lymphedema right lower extremity.  She has compression wrap in place  Neurological:     Mental Status: She is alert.      No results found for any visits on 03/14/24.  Last CBC Lab Results  Component Value Date   WBC 13.1 (H) 04/15/2022   HGB 12.1 04/15/2022   HCT 35.6 (L) 04/15/2022   MCV 100.3 (H) 04/15/2022   MCH 34.1 (H) 04/15/2022   RDW 12.5 04/15/2022   PLT 344 04/15/2022   Last metabolic panel Lab Results  Component Value Date   GLUCOSE 104 (H) 09/28/2023   NA 129 (L) 09/28/2023   K 4.9 09/28/2023   CL 94 (L) 09/28/2023   CO2 29 09/28/2023   BUN 27 (H) 09/28/2023    CREATININE 0.93 09/28/2023   GFR 56.38 (L) 09/28/2023   CALCIUM 9.4 09/28/2023   PROT 7.1 09/28/2023   ALBUMIN 4.4 09/28/2023   BILITOT 0.3 09/28/2023   ALKPHOS 60 09/28/2023   AST 23 09/28/2023   ALT 17 09/28/2023   ANIONGAP 8 04/15/2022   Last thyroid  functions Lab Results  Component Value Date  TSH 1.84 09/28/2023      The ASCVD Risk score (Arnett DK, et al., 2019) failed to calculate for the following reasons:   The 2019 ASCVD risk score is only valid for ages 10 to 67    Assessment & Plan:   #1 hypertension.  Initial blood pressure reading was up but did improved significantly after resting.  Continue current regimen of hydralazine , Benicar , amlodipine , and metoprolol .  #2 chronic hyponatremia.  No thiazide use.  Recheck basic metabolic panel  #3 chronic insomnia.  Patient been on Ambien  for years.  Continue 5 mg nightly.  Try not to escalate dose if possible.  #4 urine urgency improved on Gemtesa.  Continue Gemtesa 75 mg nightly  #5 chronic constipation.  Patient has recently had some loose stools with daily MiraLAX .  We suggested trying to go to a lesser regimen of perhaps 3 times weekly or every other day  #6 hypothyroidism.  TSH at goal with labs last fall.  Recheck at 76-month follow-up   Return in about 6 months (around 09/13/2024).    Glean Lamy, MD

## 2024-03-14 NOTE — Addendum Note (Signed)
 Addended by: Aurelio Leer on: 03/14/2024 02:02 PM   Modules accepted: Orders

## 2024-03-15 ENCOUNTER — Other Ambulatory Visit: Payer: Self-pay | Admitting: Family Medicine

## 2024-03-23 ENCOUNTER — Encounter: Payer: Self-pay | Admitting: Radiation Oncology

## 2024-03-25 ENCOUNTER — Other Ambulatory Visit: Payer: Self-pay | Admitting: Family Medicine

## 2024-03-28 ENCOUNTER — Ambulatory Visit: Payer: Medicare Other | Admitting: Family Medicine

## 2024-04-05 DIAGNOSIS — M25551 Pain in right hip: Secondary | ICD-10-CM | POA: Diagnosis not present

## 2024-04-05 DIAGNOSIS — M79604 Pain in right leg: Secondary | ICD-10-CM | POA: Diagnosis not present

## 2024-04-05 DIAGNOSIS — Z79891 Long term (current) use of opiate analgesic: Secondary | ICD-10-CM | POA: Diagnosis not present

## 2024-04-05 DIAGNOSIS — G894 Chronic pain syndrome: Secondary | ICD-10-CM | POA: Diagnosis not present

## 2024-04-06 ENCOUNTER — Other Ambulatory Visit: Payer: Self-pay | Admitting: Radiology

## 2024-04-06 ENCOUNTER — Telehealth: Payer: Self-pay | Admitting: *Deleted

## 2024-04-06 DIAGNOSIS — C4921 Malignant neoplasm of connective and soft tissue of right lower limb, including hip: Secondary | ICD-10-CM

## 2024-04-06 NOTE — Telephone Encounter (Signed)
 Called patient to inform of MRI for 04-08-24- arrival time- 12:30 pm @ [facility], no restrictions to scan, patient will go for chest x-ray on 04-08-24, just a walk in for this scan, patient to receive results from Dr. Eloise Hake on 04-19-24 @ 1 pm, spoke with patient and she is aware of these appts. and the instructions

## 2024-04-08 ENCOUNTER — Ambulatory Visit (HOSPITAL_COMMUNITY)
Admission: RE | Admit: 2024-04-08 | Discharge: 2024-04-08 | Disposition: A | Discharge status: Home / Self Care | Source: Ambulatory Visit | Attending: Radiology | Admitting: Radiology

## 2024-04-08 ENCOUNTER — Other Ambulatory Visit: Payer: Self-pay | Admitting: Radiology

## 2024-04-08 DIAGNOSIS — Z0389 Encounter for observation for other suspected diseases and conditions ruled out: Secondary | ICD-10-CM | POA: Diagnosis not present

## 2024-04-08 DIAGNOSIS — M25461 Effusion, right knee: Secondary | ICD-10-CM | POA: Diagnosis not present

## 2024-04-08 DIAGNOSIS — Z96651 Presence of right artificial knee joint: Secondary | ICD-10-CM | POA: Diagnosis not present

## 2024-04-08 DIAGNOSIS — C4921 Malignant neoplasm of connective and soft tissue of right lower limb, including hip: Secondary | ICD-10-CM

## 2024-04-13 DIAGNOSIS — H26491 Other secondary cataract, right eye: Secondary | ICD-10-CM | POA: Diagnosis not present

## 2024-04-13 DIAGNOSIS — Z961 Presence of intraocular lens: Secondary | ICD-10-CM | POA: Diagnosis not present

## 2024-04-13 DIAGNOSIS — H02402 Unspecified ptosis of left eyelid: Secondary | ICD-10-CM | POA: Diagnosis not present

## 2024-04-13 DIAGNOSIS — H52203 Unspecified astigmatism, bilateral: Secondary | ICD-10-CM | POA: Diagnosis not present

## 2024-04-18 NOTE — Progress Notes (Signed)
 Radiation Oncology         [phone] ________________________________  Name: Destiny [name] MRN: [id number]  Date: 04/19/2024  DOB: [birth year]  Follow-Up Visit Note  CC: [name], MD  [name], MD  No diagnosis found.  Diagnosis: Stage IIIB (pT3, cN0) Liposarcoma of the Right Thigh, Grade 3     Interval Since Last Radiation: 4 years, 8 months, and 11 days    Radiation Treatment Dates: 06/27/2019 through 08/12/2019 Site Technique Total Dose Dose per Fx Completed Fx Beam Energies  Extremity: Ext_Rt 3D 50/50 2 25/25 6X, 10X  Extremity: Ext_Rt_Bst 3D 16/16 2 8/8 6X     Narrative:  The patient returns today for routine follow-up. She was last seen in office on 11/02/23 for a routine follow up.   In the interval since she was last seen, patient continued to follow up with their specialists to manage their chronic conditions.  No other significant oncologic interval history since the patient was last seen.                                  Allergies:  is allergic to nsaids, oxycodone-acetaminophen , aspirin , augmentin [amoxicillin-pot clavulanate], ciprocin-fluocin-procin [fluocinolone], ciprofloxacin  hcl, fentanyl , fluclorolone, lyrica [pregabalin], oxycodone, versed  [midazolam ], diclofenac sodium, hydromorphone  hcl, and voltaren gel [diclofenac sodium].  Meds: Current Outpatient Medications  Medication Sig Dispense Refill   acetaminophen  (TYLENOL ) 500 MG tablet Take 500 mg by mouth every 4 (four) hours as needed.     amLODipine  (NORVASC ) 5 MG tablet Take 1 tablet (5 mg total) by mouth daily. 90 tablet 3   b complex vitamins capsule Take 1 capsule by mouth daily.     Calcium Carb-Cholecalciferol 600-200 MG-UNIT TABS Take 1 tablet by mouth daily.      cephALEXin (KEFLEX) 250 MG capsule Take 250 mg by mouth daily.     cholecalciferol (VITAMIN D) 1000 UNITS tablet Take 2,000 Units by mouth daily.     gabapentin  (NEURONTIN ) 300 MG capsule Take 600-900 mg by mouth  See admin instructions. Take 300 mg every morning and900 mg at bedtime     hydrALAZINE  (APRESOLINE ) 25 MG tablet TAKE 1 TABLET BY MOUTH TWICE DAILY. 180 tablet 1   HYDROcodone -acetaminophen  (NORCO/VICODIN) 5-325 MG tablet Take 1 tablet by mouth every 4 (four) hours as needed for moderate pain or severe pain.      ipratropium (ATROVENT ) 0.06 % nasal spray PLACE 2 SPRAYS IN EACH NOSTRIL 3 TIMES A DAY 15 mL 2   levothyroxine  (SYNTHROID ) 137 MCG tablet TAKE 1 TABLET BY MOUTH DAILY BEFORE BREAKFAST. 90 tablet 1   loratadine-pseudoephedrine (CLARITIN-D 12-HOUR) 5-120 MG per tablet Take 1 tablet by mouth daily.     metoprolol  succinate (TOPROL -XL) 50 MG 24 hr tablet TAKE 1 TABLET BY MOUTH 2 TIMES DAILY. 180 tablet 2   olmesartan  (BENICAR ) 40 MG tablet TAKE 1 TABLET BY MOUTH DAILY. 90 tablet 2   polyethylene glycol (MIRALAX ) 17 g packet Take 17 g by mouth daily. 14 each 0   Soybean-Soy Isoflavones (SOY BALANCE PO) Take 1 tablet by mouth daily. Soy bean extract     Vibegron (GEMTESA) 75 MG TABS Take 75 mg by mouth at bedtime.     zolpidem  (AMBIEN ) 5 MG tablet TAKE 1 TABLET BY MOUTH ONCE DAILY AT BEDTIME 90 tablet 1   No current facility-administered medications for this encounter.    Physical Findings: The patient is in no acute  distress. Patient is alert and oriented.  vitals were not taken for this visit. .  No significant changes. Lungs are clear to auscultation bilaterally. Heart has regular rate and rhythm. No palpable cervical, supraclavicular, or axillary adenopathy. Abdomen soft, non-tender, normal bowel sounds.   Lab Findings: Lab Results  Component Value Date   WBC 13.1 (H) 04/15/2022   HGB 12.1 04/15/2022   HCT 35.6 (L) 04/15/2022   MCV 100.3 (H) 04/15/2022   PLT 344 04/15/2022    Radiographic Findings: MR FRMUR RIGHT WO CONTRAST Result Date: 04/17/2024 CLINICAL DATA:  Right thigh sartorius liposarcoma, removal 5 years ago. Swelling around the knee. EXAM: MRI OF THE RIGHT FEMUR  WITHOUT CONTRAST TECHNIQUE: Multiplanar, multisequence MR imaging of the right femur was performed. No intravenous contrast was administered (originally requested with and without IV contrast although patient refused contrast). COMPARISON:  Multiple exams, including 10/30/2023 FINDINGS: Bones/Joint/Cartilage Metal artifact along the knee related to medial unicompartmental arthroplasty. Marginal spurring in the patellofemoral joint. Small right knee joint effusion. Incidental moderate-size contralateral (left) knee joint effusion observed on the coronal images. Ligaments N/A Muscles and Tendons On the CT of 03/29/2019, there was a sizable and partially exophytic enhancing mass of the lateral sartorius muscle with fatty component an indistinct surrounding fat planes compatible with liposarcoma. On more recent examinations such as 10/30/2023 there has been evidence of postoperative scarring and atrophy along the distal sartorius muscle along with a cystic fluid collection favoring postoperative seroma at the resection site, but without compelling nodular or masslike enhancement to indicate definite tumor recurrence. On today's examination we once again demonstrate accentuated T2 signal tracking along fascia planes in the vicinity of the resection favoring scarring, without compelling indicators of substantial morphologic change to indicate growth of a recurrent tumor. No significant change in the mildly septated residual cystic lesion probably a postoperative seroma, measuring 2.8 by 1.6 by 9.7 cm (volume = 23 cm^3). Continue trace edema tracking posteriorly in the vastus medialis, along the distal abductor musculature, and along the distal gracilis with fatty atrophy/replacement of the semimembranosus muscle. No compelling evidence of recurrent or active malignancy on today's exam. Soft tissues Expected postoperative appearance in the subcutaneous tissues of the right posteromedial lower thigh. IMPRESSION: 1. No  compelling evidence of recurrent or active malignancy on today's noncontrast exam. 2. Stable postoperative appearance of the right sartorius muscle with accentuated T2 signal tracking along fascia planes in the vicinity of the resection site, without compelling indicators of substantial morphologic change to indicate growth of a recurrent tumor. 3. Stable 23 cc mildly septated cystic lesion at the resection site, probably a postoperative seroma. 4. Small right knee joint effusion. Incidental moderate-size contralateral (left) knee joint effusion. 5. Medial unicompartmental arthroplasty of the right knee. Marginal spurring in the patellofemoral joint. Electronically Signed   By: Freida Jes M.D.   On: 04/17/2024 10:10   DG Chest 2 View Result Date: 04/08/2024 CLINICAL DATA:  83 year old female with possible metastatic disease EXAM: CHEST - 2 VIEW COMPARISON:  10/30/2023 FINDINGS: Cardiomediastinal silhouette unchanged in size and contour. No pneumothorax or pleural effusion.  No confluent airspace disease. Surgical changes of the right proximal humerus. Degenerative changes of the spine. IMPRESSION: Negative for acute cardiopulmonary disease Electronically Signed   By: Myrlene Asper D.O.   On: 04/08/2024 16:17    Impression:  {diagnosis}  The patient is recovering from the effects of radiation.  ***  Plan:  ***   *** minutes of total time was spent for this  patient encounter, including preparation, face-to-face counseling with the patient and coordination of care, physical exam, and documentation of the encounter. ____________________________________  Noralee Beam, PhD, MD  This document serves as a record of services personally performed by Retta Caster, MD. It was created on his behalf by Lucky Sable, a trained medical scribe. The creation of this record is based on the scribe's personal observations and the provider's statements to them. This document has been checked and approved by  the attending provider.

## 2024-04-19 ENCOUNTER — Encounter: Payer: Self-pay | Admitting: Radiation Oncology

## 2024-04-19 ENCOUNTER — Ambulatory Visit
Admission: RE | Admit: 2024-04-19 | Discharge: 2024-04-19 | Disposition: A | Discharge status: Home / Self Care | Source: Ambulatory Visit | Attending: Radiation Oncology | Admitting: Radiation Oncology

## 2024-04-19 VITALS — BP 141/68 | HR 67 | Temp 97.4°F | Resp 20 | Ht 61.0 in | Wt 159.0 lb

## 2024-04-19 DIAGNOSIS — C4921 Malignant neoplasm of connective and soft tissue of right lower limb, including hip: Secondary | ICD-10-CM | POA: Diagnosis not present

## 2024-04-19 DIAGNOSIS — Z923 Personal history of irradiation: Secondary | ICD-10-CM | POA: Insufficient documentation

## 2024-04-19 DIAGNOSIS — Z79890 Hormone replacement therapy (postmenopausal): Secondary | ICD-10-CM | POA: Insufficient documentation

## 2024-04-19 DIAGNOSIS — Z79899 Other long term (current) drug therapy: Secondary | ICD-10-CM | POA: Diagnosis not present

## 2024-04-19 DIAGNOSIS — M25462 Effusion, left knee: Secondary | ICD-10-CM | POA: Diagnosis not present

## 2024-04-19 DIAGNOSIS — M25461 Effusion, right knee: Secondary | ICD-10-CM | POA: Diagnosis not present

## 2024-04-19 DIAGNOSIS — I890 Lymphedema, not elsewhere classified: Secondary | ICD-10-CM | POA: Insufficient documentation

## 2024-04-19 DIAGNOSIS — Z8589 Personal history of malignant neoplasm of other organs and systems: Secondary | ICD-10-CM | POA: Diagnosis not present

## 2024-04-19 NOTE — Progress Notes (Signed)
 Destiny [name] is here today for follow up post radiation to the right thigh.  They completed their radiation on: 08/12/19   Does the patient complain of any of the following:  Pain: No Fatigue:Yes Ambulation:Yes, patient has noticed some balance issues.  Swelling: Yes, continues to have swelling to bilateral legs.  Post radiation skin changes: No   Additional comments if applicable:   BP (!) 141/68 (BP Location: Right Arm, Patient Position: Sitting, Cuff Size: Normal)   Pulse 67   Temp (!) 97.4 F (36.3 C)   Resp 20   Ht 5\' 1"  (1.549 m)   Wt 159 lb (72.1 kg)   SpO2 98%   BMI 30.04 kg/m

## 2024-04-27 ENCOUNTER — Other Ambulatory Visit: Payer: Self-pay | Admitting: Family Medicine

## 2024-05-04 ENCOUNTER — Other Ambulatory Visit: Payer: Self-pay | Admitting: Family Medicine

## 2024-05-16 ENCOUNTER — Ambulatory Visit: Payer: Self-pay | Admitting: Radiation Oncology

## 2024-05-31 DIAGNOSIS — M25551 Pain in right hip: Secondary | ICD-10-CM | POA: Diagnosis not present

## 2024-05-31 DIAGNOSIS — G894 Chronic pain syndrome: Secondary | ICD-10-CM | POA: Diagnosis not present

## 2024-05-31 DIAGNOSIS — Z79891 Long term (current) use of opiate analgesic: Secondary | ICD-10-CM | POA: Diagnosis not present

## 2024-05-31 DIAGNOSIS — M79604 Pain in right leg: Secondary | ICD-10-CM | POA: Diagnosis not present

## 2024-06-07 DIAGNOSIS — N3020 Other chronic cystitis without hematuria: Secondary | ICD-10-CM | POA: Diagnosis not present

## 2024-06-28 ENCOUNTER — Encounter: Payer: Self-pay | Admitting: Family Medicine

## 2024-06-28 ENCOUNTER — Ambulatory Visit: Admitting: Family Medicine

## 2024-06-28 VITALS — BP 118/58 | HR 72 | Temp 97.7°F | Wt 157.6 lb

## 2024-06-28 DIAGNOSIS — K5900 Constipation, unspecified: Secondary | ICD-10-CM

## 2024-06-28 DIAGNOSIS — I10 Essential (primary) hypertension: Secondary | ICD-10-CM

## 2024-06-28 DIAGNOSIS — E039 Hypothyroidism, unspecified: Secondary | ICD-10-CM | POA: Diagnosis not present

## 2024-06-28 NOTE — Patient Instructions (Signed)
 Monitor blood pressure and be in touch if consistently getting > 140 systolic (top number)  Bring in your cuff at follow up for comparison.

## 2024-06-28 NOTE — Progress Notes (Signed)
 Established Patient Office Visit  Subjective   Patient ID: Destiny [name], female    DOB: 09-09-1941  Age: 83 y.o. MRN: [id number]  Chief Complaint  Patient presents with   Hypertension   Altered Mental Status   Nausea   Constipation    HPI   [name] has history of hypertension, hypothyroidism, essential tremor, idiopathic peripheral neuropathy, osteoarthritis involving multiple joints, chronic insomnia, chronic pain syndrome, chronic lymphedema right lower extremity, and history of liposarcoma right thigh.  She is here today for multiple issues as follows  She relates recent UTI around mid July.  Went to urology.  Has had history of recurrent UTIs in the past.  Was treated with some type of antibiotic for 10 days and improved.  No dysuria at this time.  No fevers or chills.  Currently takes d-mannose and cranberry tablets for prophylaxis  Constipation.  She is on chronic opioids followed by pain management on Vicodin 1 twice daily.  Has taken some occasional MiraLAX  but inconsistently.  Recently used some glycerin  suppositories.  Tries to eat natural sources of fiber such as high-fiber cereal and fruits and vegetables.  No recent bloody stools. Does have hypothyroidism and last TSH was last November and at goal.  She is consistently taking her levothyroxine   Concern for elevated blood pressure.  She had blood pressure reading yesterday 147/75 and felt a little bit of brain fog .  Blood pressure actually much improved today.  Treated with several medications including hydralazine , metoprolol , Benicar , and amlodipine .  She has chronic mild hyponatremia and is not a good candidate for thiazides.  Past Medical History:  Diagnosis Date   Anxiety    Arthritis    Colon polyps    Complication of anesthesia    BP drops with fentyl and versed    Fatty liver    Hepatic lesion    Hepatitis    hep A  1976   History of hepatitis A    1976   History of radiation therapy    Right  thigh- 07/24/19-08/12/19- Dr. Lynwood Nasuti   Hypertension    Hypothyroidism    Internal hemorrhoids    Liposarcoma of right thigh (HCC) 03/2019   Past Surgical History:  Procedure Laterality Date   ABDOMINAL HYSTERECTOMY     APPENDECTOMY     BACK SURGERY      fusion   CESAREAN SECTION     times 2   COLONOSCOPY     EYE SURGERY Left    cataract removed   FINGER ARTHRODESIS Left 10/02/2017   Procedure: LEFT LONG FINGER ARTHRODESIS;  Surgeon: Sebastian Lenis, MD;  Location: [facility];  Service: Orthopedics;  Laterality: Left;   FOOT SURGERY Bilateral    FRACTURE SURGERY     broken ankle   MEDIAL PARTIAL KNEE REPLACEMENT Left    done at [facility]   REVERSE SHOULDER ARTHROPLASTY Right 12/28/2014   Procedure: REVERSE SHOULDER ARTHROPLASTY;  Surgeon: Eva Elsie Herring, MD;  Location: [facility];  Service: Orthopedics;  Laterality: Right;  Right reverse total shoulder arthroplasty   REVERSE TOTAL SHOULDER ARTHROPLASTY Right 12/28/2014   DR CHANDLER   rotator cufff     THYROIDECTOMY, PARTIAL     one lobe removed   VARICOSE VEIN SURGERY Right     reports that she has never smoked. She has never used smokeless tobacco. She reports current alcohol use. She reports that she does not use drugs. family history includes Atrial fibrillation in her mother; Cancer - Other  in her father; Heart disease in her mother; Pancreatic cancer in her father and maternal uncle. Allergies  Allergen Reactions   Nsaids Other (See Comments)     BRUISES EASILY Other Reaction: BRUISES EASILY Other Reaction: BRUISES EASILY    Oxycodone-Acetaminophen  Other (See Comments)     VERY CONSTIPATING, does not provide pain relief   Aspirin  Other (See Comments)    Bruising    Augmentin [Amoxicillin-Pot Clavulanate] Other (See Comments)    headache   Ciprocin-Fluocin-Procin [Fluocinolone]    Ciprofloxacin  Hcl     tendonitis   Fentanyl  Other (See Comments)    Does not work   Fluclorolone Other (See  Comments)   Lyrica [Pregabalin] Other (See Comments)    dysphoria   Oxycodone Other (See Comments)    Does not work   Versed  [Midazolam ] Other (See Comments)    Does not work   Diclofenac Sodium Other (See Comments) and Rash    Skin peeling. Voltaren gel-rash and skin peeling    Hydromorphone  Hcl Itching     Itching with large doses - able to tolerate lower doses or shorter duration   Voltaren Gel [Diclofenac Sodium] Rash and Other (See Comments)    Skin peeling.    Review of Systems  Constitutional:  Positive for malaise/fatigue. Negative for chills and fever.  Eyes:  Negative for blurred vision.  Respiratory:  Negative for shortness of breath.   Cardiovascular:  Negative for chest pain.  Gastrointestinal:  Positive for constipation. Negative for abdominal pain.  Genitourinary:  Negative for dysuria.  Neurological:  Negative for dizziness, weakness and headaches.      Objective:     BP (!) 118/58 (BP Location: Left Arm, Cuff Size: Normal)   Pulse 72   Temp 97.7 F (36.5 C) (Oral)   Wt 157 lb 9.6 oz (71.5 kg)   SpO2 96%   BMI 29.78 kg/m  BP Readings from Last 3 Encounters:  06/28/24 (!) 118/58  04/19/24 (!) 141/68  03/14/24 138/60   Wt Readings from Last 3 Encounters:  06/28/24 157 lb 9.6 oz (71.5 kg)  04/19/24 159 lb (72.1 kg)  03/14/24 157 lb 14.4 oz (71.6 kg)      Physical Exam Vitals reviewed.  Constitutional:      General: She is not in acute distress.    Appearance: She is not ill-appearing.  Cardiovascular:     Rate and Rhythm: Normal rate and regular rhythm.  Pulmonary:     Effort: Pulmonary effort is normal.     Breath sounds: Normal breath sounds. No wheezing or rales.  Musculoskeletal:     Comments: Chronic edema right lower extremity related to her lymphedema  Neurological:     General: No focal deficit present.     Mental Status: She is alert.      No results found for any visits on 06/28/24.  Last CBC Lab Results  Component Value  Date   WBC 13.1 (H) 04/15/2022   HGB 12.1 04/15/2022   HCT 35.6 (L) 04/15/2022   MCV 100.3 (H) 04/15/2022   MCH 34.1 (H) 04/15/2022   RDW 12.5 04/15/2022   PLT 344 04/15/2022   Last metabolic panel Lab Results  Component Value Date   GLUCOSE 139 (H) 03/14/2024   NA 131 (L) 03/14/2024   K 4.6 03/14/2024   CL 97 (L) 03/14/2024   CO2 28 03/14/2024   BUN 24 (H) 03/14/2024   CREATININE 0.84 03/14/2024   GFRNONAA >60 03/14/2024   CALCIUM 9.4 03/14/2024  PROT 7.1 09/28/2023   ALBUMIN 4.4 09/28/2023   BILITOT 0.3 09/28/2023   ALKPHOS 60 09/28/2023   AST 23 09/28/2023   ALT 17 09/28/2023   ANIONGAP 6 03/14/2024   Last hemoglobin A1c No results found for: HGBA1C Last thyroid  functions Lab Results  Component Value Date   TSH 1.84 09/28/2023      The ASCVD Risk score (Arnett DK, et al., 2019) failed to calculate for the following reasons:   The 2019 ASCVD risk score is only valid for ages 58 to 46    Assessment & Plan:   #1 hypertension.  Up a bit yesterday but well-controlled today.  Repeat left arm seated after rest 118/58.  Continue current regimen of hydralazine , metoprolol , Benicar , and amlodipine .  Continue monitoring.  We did suggest she bring in her home blood pressure monitor at follow-up to compare with ours  #2 constipation.  Likely exacerbated by chronic opioid use.  Symptoms somewhat intermittent.  Discussed importance of adequate natural dietary fiber with goal of 25 g/day with continued use of MiraLAX  and stool softener as needed.  Avoid stimulant laxatives.  #3 hypothyroidism.  Patient on levothyroxine  137 mcg daily.  Recheck TSH at 77-month follow-up  Wolm Scarlet, MD

## 2024-07-01 DIAGNOSIS — M2041 Other hammer toe(s) (acquired), right foot: Secondary | ICD-10-CM | POA: Diagnosis not present

## 2024-07-01 DIAGNOSIS — M7741 Metatarsalgia, right foot: Secondary | ICD-10-CM | POA: Diagnosis not present

## 2024-07-12 DIAGNOSIS — N3020 Other chronic cystitis without hematuria: Secondary | ICD-10-CM | POA: Diagnosis not present

## 2024-07-22 DIAGNOSIS — M2041 Other hammer toe(s) (acquired), right foot: Secondary | ICD-10-CM | POA: Diagnosis not present

## 2024-07-22 DIAGNOSIS — M7741 Metatarsalgia, right foot: Secondary | ICD-10-CM | POA: Diagnosis not present

## 2024-07-22 DIAGNOSIS — M24574 Contracture, right foot: Secondary | ICD-10-CM | POA: Diagnosis not present

## 2024-07-26 ENCOUNTER — Other Ambulatory Visit: Payer: Self-pay | Admitting: Orthopedic Surgery

## 2024-07-28 ENCOUNTER — Encounter: Payer: Self-pay | Admitting: Rehabilitation

## 2024-08-03 DIAGNOSIS — Z79891 Long term (current) use of opiate analgesic: Secondary | ICD-10-CM | POA: Diagnosis not present

## 2024-08-03 DIAGNOSIS — G894 Chronic pain syndrome: Secondary | ICD-10-CM | POA: Diagnosis not present

## 2024-08-03 DIAGNOSIS — M25551 Pain in right hip: Secondary | ICD-10-CM | POA: Diagnosis not present

## 2024-08-03 DIAGNOSIS — M79604 Pain in right leg: Secondary | ICD-10-CM | POA: Diagnosis not present

## 2024-08-17 DIAGNOSIS — N3020 Other chronic cystitis without hematuria: Secondary | ICD-10-CM | POA: Diagnosis not present

## 2024-08-17 DIAGNOSIS — R6889 Other general symptoms and signs: Secondary | ICD-10-CM | POA: Diagnosis not present

## 2024-08-19 ENCOUNTER — Encounter (HOSPITAL_BASED_OUTPATIENT_CLINIC_OR_DEPARTMENT_OTHER): Payer: Self-pay | Admitting: Orthopedic Surgery

## 2024-08-22 ENCOUNTER — Encounter (HOSPITAL_BASED_OUTPATIENT_CLINIC_OR_DEPARTMENT_OTHER)
Admission: RE | Admit: 2024-08-22 | Discharge: 2024-08-22 | Disposition: A | Discharge status: Home / Self Care | Source: Ambulatory Visit | Attending: Orthopedic Surgery | Admitting: Orthopedic Surgery

## 2024-08-22 DIAGNOSIS — Z01818 Encounter for other preprocedural examination: Secondary | ICD-10-CM | POA: Insufficient documentation

## 2024-08-22 LAB — BASIC METABOLIC PANEL WITH GFR
Anion gap: 12 (ref 5–15)
BUN: 29 mg/dL — ABNORMAL HIGH (ref 8–23)
CO2: 23 mmol/L (ref 22–32)
Calcium: 8.8 mg/dL — ABNORMAL LOW (ref 8.9–10.3)
Chloride: 93 mmol/L — ABNORMAL LOW (ref 98–111)
Creatinine, Ser: 1.04 mg/dL — ABNORMAL HIGH (ref 0.44–1.00)
GFR, Estimated: 53 mL/min — ABNORMAL LOW (ref >60)
Glucose, Bld: 95 mg/dL (ref 70–99)
Potassium: 5.4 mmol/L — ABNORMAL HIGH (ref 3.5–5.1)
Sodium: 128 mmol/L — ABNORMAL LOW (ref 135–145)

## 2024-08-22 NOTE — Progress Notes (Signed)
 K+-5.4, Dr. Merla aware, will repeat BMET day of surgery.

## 2024-08-22 NOTE — Progress Notes (Signed)

## 2024-08-25 ENCOUNTER — Encounter (HOSPITAL_BASED_OUTPATIENT_CLINIC_OR_DEPARTMENT_OTHER): Admission: RE | Disposition: A | Payer: Self-pay | Source: Home / Self Care | Attending: Orthopedic Surgery

## 2024-08-25 ENCOUNTER — Ambulatory Visit (HOSPITAL_BASED_OUTPATIENT_CLINIC_OR_DEPARTMENT_OTHER)
Admission: RE | Admit: 2024-08-25 | Discharge: 2024-08-25 | Disposition: A | Discharge status: Home / Self Care | Attending: Orthopedic Surgery | Admitting: Orthopedic Surgery

## 2024-08-25 ENCOUNTER — Other Ambulatory Visit: Payer: Self-pay

## 2024-08-25 ENCOUNTER — Ambulatory Visit (HOSPITAL_BASED_OUTPATIENT_CLINIC_OR_DEPARTMENT_OTHER): Admitting: Anesthesiology

## 2024-08-25 ENCOUNTER — Ambulatory Visit (HOSPITAL_BASED_OUTPATIENT_CLINIC_OR_DEPARTMENT_OTHER)

## 2024-08-25 ENCOUNTER — Encounter (HOSPITAL_BASED_OUTPATIENT_CLINIC_OR_DEPARTMENT_OTHER): Payer: Self-pay | Admitting: Orthopedic Surgery

## 2024-08-25 DIAGNOSIS — Z79899 Other long term (current) drug therapy: Secondary | ICD-10-CM | POA: Diagnosis not present

## 2024-08-25 DIAGNOSIS — F419 Anxiety disorder, unspecified: Secondary | ICD-10-CM

## 2024-08-25 DIAGNOSIS — I10 Essential (primary) hypertension: Secondary | ICD-10-CM | POA: Insufficient documentation

## 2024-08-25 DIAGNOSIS — E039 Hypothyroidism, unspecified: Secondary | ICD-10-CM

## 2024-08-25 DIAGNOSIS — M24574 Contracture, right foot: Secondary | ICD-10-CM | POA: Diagnosis not present

## 2024-08-25 DIAGNOSIS — M2041 Other hammer toe(s) (acquired), right foot: Secondary | ICD-10-CM | POA: Diagnosis not present

## 2024-08-25 DIAGNOSIS — Z01818 Encounter for other preprocedural examination: Secondary | ICD-10-CM

## 2024-08-25 HISTORY — PX: WEIL OSTEOTOMY: SHX5044

## 2024-08-25 HISTORY — PX: HAMMER TOE SURGERY: SHX385

## 2024-08-25 SURGERY — CORRECTION, HAMMER TOE
Anesthesia: Monitor Anesthesia Care | Site: Toe | Laterality: Right

## 2024-08-25 MED ORDER — MORPHINE SULFATE (PF) 4 MG/ML IV SOLN: 1.0000 mg | INTRAVENOUS | Status: DC | PRN

## 2024-08-25 MED ORDER — PROPOFOL 500 MG/50ML IV EMUL
INTRAVENOUS | Status: DC | PRN
  Administered 2024-08-25: 60 ug/kg/min via INTRAVENOUS

## 2024-08-25 MED ORDER — VANCOMYCIN HCL 500 MG IV SOLR
INTRAVENOUS | Status: AC
  Filled 2024-08-25: qty 10

## 2024-08-25 MED ORDER — SODIUM CHLORIDE 0.9 % IV SOLN: 12.5000 mg | INTRAVENOUS | Status: DC | PRN

## 2024-08-25 MED ORDER — FENTANYL CITRATE (PF) 100 MCG/2ML IJ SOLN
INTRAMUSCULAR | Status: AC
  Filled 2024-08-25: qty 2

## 2024-08-25 MED ORDER — 0.9 % SODIUM CHLORIDE (POUR BTL) OPTIME
TOPICAL | Status: DC | PRN
  Administered 2024-08-25: 100 mL

## 2024-08-25 MED ORDER — PROPOFOL 10 MG/ML IV BOLUS
INTRAVENOUS | Status: DC | PRN
  Administered 2024-08-25 (×2): 20 mg via INTRAVENOUS

## 2024-08-25 MED ORDER — LIDOCAINE 2% (20 MG/ML) 5 ML SYRINGE
INTRAMUSCULAR | Status: DC | PRN
  Administered 2024-08-25: 40 mg via INTRAVENOUS

## 2024-08-25 MED ORDER — LACTATED RINGERS IV SOLN: INTRAVENOUS | Status: DC

## 2024-08-25 MED ORDER — SODIUM CHLORIDE 0.9 % IV SOLN
INTRAVENOUS | Status: AC | PRN
  Administered 2024-08-25: 250 mL via INTRAMUSCULAR

## 2024-08-25 MED ORDER — BUPIVACAINE-EPINEPHRINE 0.5% -1:200000 IJ SOLN
INTRAMUSCULAR | Status: DC | PRN
  Administered 2024-08-25: 10 mL

## 2024-08-25 MED ORDER — ACETAMINOPHEN 10 MG/ML IV SOLN
INTRAVENOUS | Status: DC | PRN
  Administered 2024-08-25: 1000 mg via INTRAVENOUS

## 2024-08-25 MED ORDER — CEFAZOLIN SODIUM-DEXTROSE 2-4 GM/100ML-% IV SOLN
2.0000 g | INTRAVENOUS | Status: AC
  Administered 2024-08-25: 2 g via INTRAVENOUS

## 2024-08-25 SURGICAL SUPPLY — 62 items
BIT DRILL 2 STRT CANN (BIT) IMPLANT
BLADE AVERAGE 25X9 (BLADE) IMPLANT
BLADE LONG MED 25X9 (BLADE) ×2 IMPLANT
BLADE MINI RND TIP GREEN BEAV (BLADE) ×2 IMPLANT
BLADE OSC/SAG .038X5.5 CUT EDG (BLADE) IMPLANT
BLADE SURG 15 STRL LF DISP TIS (BLADE) ×4 IMPLANT
BNDG COHESIVE 4X5 TAN STRL LF (GAUZE/BANDAGES/DRESSINGS) IMPLANT
BNDG COMPR ESMARK 4X3 LF (GAUZE/BANDAGES/DRESSINGS) IMPLANT
BNDG COMPR ESMARK 6X3 LF (GAUZE/BANDAGES/DRESSINGS) IMPLANT
BNDG ELASTIC 4INX 5YD STR LF (GAUZE/BANDAGES/DRESSINGS) ×2 IMPLANT
BNDG STRETCH GAUZE 3IN X12FT (GAUZE/BANDAGES/DRESSINGS) ×2 IMPLANT
BUR MIS STRT 2.0X13 (BURR) IMPLANT
CHLORAPREP W/TINT 26 (MISCELLANEOUS) ×2 IMPLANT
COVER BACK TABLE 60X90IN (DRAPES) ×2 IMPLANT
CUFF TRNQT CYL 24X4X16.5-23 (TOURNIQUET CUFF) IMPLANT
CUFF TRNQT CYL 34X4.125X (TOURNIQUET CUFF) IMPLANT
DRAPE EXTREMITY T 121X128X90 (DISPOSABLE) ×2 IMPLANT
DRAPE INCISE IOBAN 66X45 STRL (DRAPES) IMPLANT
DRAPE OEC MINIVIEW 54X84 (DRAPES) ×2 IMPLANT
DRAPE U-SHAPE 47X51 STRL (DRAPES) ×2 IMPLANT
DRSG MEPITEL 4X7.2 (GAUZE/BANDAGES/DRESSINGS) ×2 IMPLANT
ELECTRODE REM PT RTRN 9FT ADLT (ELECTROSURGICAL) ×2 IMPLANT
GAUZE PAD ABD 8X10 STRL (GAUZE/BANDAGES/DRESSINGS) IMPLANT
GAUZE SPONGE 4X4 12PLY STRL (GAUZE/BANDAGES/DRESSINGS) ×2 IMPLANT
GAUZE STRETCH 2X75IN STRL (MISCELLANEOUS) IMPLANT
GLOVE BIO SURGEON STRL SZ8 (GLOVE) ×2 IMPLANT
GLOVE BIOGEL PI IND STRL 7.0 (GLOVE) IMPLANT
GLOVE BIOGEL PI IND STRL 7.5 (GLOVE) IMPLANT
GLOVE BIOGEL PI IND STRL 8 (GLOVE) ×4 IMPLANT
GLOVE ECLIPSE 8.0 STRL XLNG CF (GLOVE) ×2 IMPLANT
GLOVE SURG SS PI 7.0 STRL IVOR (GLOVE) IMPLANT
GOWN STRL REUS W/ TWL LRG LVL3 (GOWN DISPOSABLE) ×2 IMPLANT
GOWN STRL REUS W/ TWL XL LVL3 (GOWN DISPOSABLE) ×4 IMPLANT
GUIDEWIRE 0.86MM (WIRE) IMPLANT
KWIRE DBL .054X4 NSTRL (WIRE) IMPLANT
NDL HYPO 22X1.5 SAFETY MO (MISCELLANEOUS) IMPLANT
NDL HYPO 25X1 1.5 SAFETY (NEEDLE) IMPLANT
NEEDLE HYPO 22X1.5 SAFETY MO (MISCELLANEOUS) IMPLANT
NEEDLE HYPO 25X1 1.5 SAFETY (NEEDLE) IMPLANT
NS IRRIG 1000ML POUR BTL (IV SOLUTION) ×2 IMPLANT
PACK BASIN DAY SURGERY FS (CUSTOM PROCEDURE TRAY) ×2 IMPLANT
PAD CAST 4YDX4 CTTN HI CHSV (CAST SUPPLIES) ×2 IMPLANT
PADDING CAST ABS COTTON 4X4 ST (CAST SUPPLIES) IMPLANT
PENCIL SMOKE EVACUATOR (MISCELLANEOUS) ×2 IMPLANT
SANITIZER HAND ALTRA PUMP 550 (MISCELLANEOUS) ×2 IMPLANT
SCREW CANN COMP FT 2.5X20 (Screw) IMPLANT
SET IRRIGATION TUBING (TUBING) IMPLANT
SHEET MEDIUM DRAPE 40X70 STRL (DRAPES) ×2 IMPLANT
SLEEVE SCD COMPRESS KNEE MED (STOCKING) ×2 IMPLANT
SPONGE T-LAP 18X18 ~~LOC~~+RFID (SPONGE) ×2 IMPLANT
STOCKINETTE 6 STRL (DRAPES) ×2 IMPLANT
SUCTION TUBE FRAZIER 10FR DISP (SUCTIONS) IMPLANT
SUT ETHILON 3 0 PS 1 (SUTURE) ×2 IMPLANT
SUT MNCRL AB 3-0 PS2 18 (SUTURE) ×2 IMPLANT
SUT VIC AB 2-0 SH 27XBRD (SUTURE) IMPLANT
SUT VICRYL 0 UR6 27IN ABS (SUTURE) IMPLANT
SYR BULB EAR ULCER 3OZ GRN STR (SYRINGE) ×2 IMPLANT
SYR CONTROL 10ML LL (SYRINGE) IMPLANT
TOWEL GREEN STERILE FF (TOWEL DISPOSABLE) ×2 IMPLANT
TUBE CONNECTING 20X1/4 (TUBING) IMPLANT
UNDERPAD 30X36 HEAVY ABSORB (UNDERPADS AND DIAPERS) ×2 IMPLANT
YANKAUER SUCT BULB TIP NO VENT (SUCTIONS) IMPLANT

## 2024-08-25 NOTE — Anesthesia Postprocedure Evaluation (Signed)
 Anesthesia Post Note  Patient: Destiny [name]  Procedure(s) Performed: right third and fourth hammertoe correections, and dorsal capsulotomies (Right: Toe) right third and fouth minimally invasive metatarsal weil osteotomies (Right: Foot)     Patient location during evaluation: PACU Anesthesia Type: MAC Level of consciousness: awake and alert Pain management: pain level controlled Vital Signs Assessment: post-procedure vital signs reviewed and stable Respiratory status: spontaneous breathing, nonlabored ventilation and respiratory function stable Cardiovascular status: blood pressure returned to baseline and stable Postop Assessment: no apparent nausea or vomiting Anesthetic complications: no   No notable events documented.  Last Vitals:  Vitals:   08/25/24 1252 08/25/24 1320  BP: (!) 142/48 (!) 140/50  Pulse: 61 63  Resp: 12 16  Temp:  36.6 C  SpO2: 96% 97%    Last Pain:  Vitals:   08/25/24 1320  TempSrc:   PainSc: 0-No pain                 Butler Levander Pinal

## 2024-08-25 NOTE — H&P (Signed)
 Destiny [name] is an 83 y.o. female.   Chief Complaint: Right foot pain HPI: 83 year old female with a multiply operated right forefoot resulting in recurrent 3rd and 4th hammertoe deformities.  She has failed nonoperative treatment including activity modification, toe splinting and shoewear modification.  She presents today for revision right 3rd and 4th hammertoe corrections, dorsal capsulotomies and metatarsal osteotomies.  Past Medical History:  Diagnosis Date   Anxiety    Arthritis    Colon polyps    Complication of anesthesia    BP drops with fentyl and versed    Fatty liver    Hepatic lesion    Hepatitis    hep A  1976   History of hepatitis A    1976   History of radiation therapy    Right thigh- 07/24/19-08/12/19- Dr. Lynwood Nasuti   Hypertension    Hypothyroidism    Internal hemorrhoids    Liposarcoma of right thigh (HCC) 03/2019    Past Surgical History:  Procedure Laterality Date   ABDOMINAL HYSTERECTOMY     APPENDECTOMY     BACK SURGERY      fusion   CESAREAN SECTION     times 2   COLONOSCOPY     EYE SURGERY Left    cataract removed   FINGER ARTHRODESIS Left 10/02/2017   Procedure: LEFT LONG FINGER ARTHRODESIS;  Surgeon: Sebastian Lenis, MD;  Location: [facility];  Service: Orthopedics;  Laterality: Left;   FOOT SURGERY Bilateral    FRACTURE SURGERY     broken ankle   MEDIAL PARTIAL KNEE REPLACEMENT Left    done at [facility]   REVERSE SHOULDER ARTHROPLASTY Right 12/28/2014   Procedure: REVERSE SHOULDER ARTHROPLASTY;  Surgeon: Eva Elsie Herring, MD;  Location: [facility] OR;  Service: Orthopedics;  Laterality: Right;  Right reverse total shoulder arthroplasty   REVERSE TOTAL SHOULDER ARTHROPLASTY Right 12/28/2014   DR CHANDLER   rotator cufff     THYROIDECTOMY, PARTIAL     one lobe removed   VARICOSE VEIN SURGERY Right     Family History  Problem Relation Age of Onset   Heart disease Mother    Atrial fibrillation Mother    Cancer -  Other Father    Pancreatic cancer Father    Pancreatic cancer Maternal Uncle    Colon cancer Neg Hx    Prostate cancer Neg Hx    Rectal cancer Neg Hx    Stomach cancer Neg Hx    Social History:  reports that she has never smoked. She has never used smokeless tobacco. She reports current alcohol use. She reports that she does not use drugs.  Allergies:  Allergies  Allergen Reactions   Nsaids Other (See Comments)     BRUISES EASILY Other Reaction: BRUISES EASILY Other Reaction: BRUISES EASILY    Oxycodone-Acetaminophen  Other (See Comments)     VERY CONSTIPATING, does not provide pain relief   Aspirin  Other (See Comments)    Bruising    Augmentin [Amoxicillin-Pot Clavulanate] Other (See Comments)    headache   Ciprocin-Fluocin-Procin [Fluocinolone]    Ciprofloxacin  Hcl     tendonitis   Fentanyl  Other (See Comments)    Does not work   Fluclorolone Other (See Comments)   Lyrica [Pregabalin] Other (See Comments)    dysphoria   Versed  [Midazolam ] Other (See Comments)    Does not work   Diclofenac Sodium Other (See Comments) and Rash    Skin peeling. Voltaren gel-rash and skin peeling    Hydromorphone  Hcl Itching  Itching with large doses - able to tolerate lower doses or shorter duration    No medications prior to admission.    No results found for this or any previous visit (from the past 48 hours). No results found.  Review of Systems no recent fever, chills, nausea, vomiting or changes in her appetite  Height 5' 1 (1.549 m), weight 70.3 kg. Physical Exam  Well-nourished well-developed elderly woman in no apparent distress.  Alert and oriented.  Normal mood and affect.  Gait is normal.  Right foot has 3rd and 4th hammertoe deformities that are not passively correctable.  Skin is healthy.  Brisk capillary refill of the toes.  No lymphadenopathy.  Intact sensibility to light touch at the forefoot.   Assessment/Plan Right forefoot recurrent 3rd and 4th hammertoe  deformities -to the operating room today for revision surgical correction.  The risks and benefits of the alternative treatment options have been discussed in detail.  The patient wishes to proceed with surgery and specifically understands risks of bleeding, infection, nerve damage, blood clots, need for additional surgery, amputation and death.   Norleen Armor, MD 08-30-24, 8:48 AM

## 2024-08-25 NOTE — Discharge Instructions (Addendum)
 May take Tylenol  after 6pm, if needed.    Post Anesthesia Home Care Instructions  Activity: Get plenty of rest for the remainder of the day. A responsible individual must stay with you for 24 hours following the procedure.  For the next 24 hours, DO NOT: -Drive a car -Advertising copywriter -Drink alcoholic beverages -Take any medication unless instructed by your physician -Make any legal decisions or sign important papers.  Meals: Start with liquid foods such as gelatin or soup. Progress to regular foods as tolerated. Avoid greasy, spicy, heavy foods. If nausea and/or vomiting occur, drink only clear liquids until the nausea and/or vomiting subsides. Call your physician if vomiting continues.  Special Instructions/Symptoms: Your throat may feel dry or sore from the anesthesia or the breathing tube placed in your throat during surgery. If this causes discomfort, gargle with warm salt water . The discomfort should disappear within 24 hours.  If you had a scopolamine  patch placed behind your ear for the management of post- operative nausea and/or vomiting:  1. The medication in the patch is effective for 72 hours, after which it should be removed.  Wrap patch in a tissue and discard in the trash. Wash hands thoroughly with soap and water . 2. You may remove the patch earlier than 72 hours if you experience unpleasant side effects which may include dry mouth, dizziness or visual disturbances. 3. Avoid touching the patch. Wash your hands with soap and water  after contact with the patch.    Norleen Armor, MD [facility]  Please read the following information regarding your care after surgery.  Medications - take your regular pain meds as needed for pain.  Weight Bearing X Bear weight when you are able on your operated leg or foot in the post-op shoe.  Cast / Splint / Dressing X Keep your splint, cast or dressing clean and dry.  Don't put anything (coat hanger, pencil, etc) down inside of it.   If it gets damp, use a hair dryer on the cool setting to dry it.  If it gets soaked, call the office to schedule an appointment for a cast change.  After your dressing, cast or splint is removed; you may shower, but do not soak or scrub the wound.  Allow the water  to run over it, and then gently pat it dry.  Swelling It is normal for you to have swelling where you had surgery.  To reduce swelling and pain, keep your toes above your nose for at least 3 days after surgery.  It may be necessary to keep your foot or leg elevated for several weeks.  If it hurts, it should be elevated.  Follow Up Call my office at [phone] when you are discharged from the hospital or surgery center to schedule an appointment to be seen two weeks after surgery.  Call my office at [phone] if you develop a fever >101.5 F, nausea, vomiting, bleeding from the surgical site or severe pain.

## 2024-08-25 NOTE — Progress Notes (Signed)
 Per Dr. Cleotilde, no need for repeat BMET DOS.

## 2024-08-25 NOTE — Transfer of Care (Signed)
 Immediate Anesthesia Transfer of Care Note  Patient: Destiny [name]  Procedure(s) Performed: right third and fourth hammertoe correections, and dorsal capsulotomies (Right: Toe) right third and fouth minimally invasive metatarsal weil osteotomies (Right: Foot)  Patient Location: PACU  Anesthesia Type:MAC  Level of Consciousness: awake, alert , and oriented  Airway & Oxygen Therapy: Patient Spontanous Breathing and Patient connected to face mask oxygen  Post-op Assessment: Report given to RN and Post -op Vital signs reviewed and stable  Post vital signs: Reviewed and stable  Last Vitals:  Vitals Value Taken Time  BP    Temp    Pulse 61 08/25/24 12:34  Resp 10 08/25/24 12:34  SpO2 98 % 08/25/24 12:34  Vitals shown include unfiled device data.  Last Pain:  Vitals:   08/25/24 1048  TempSrc: Temporal  PainSc: 1          Complications: No notable events documented.

## 2024-08-25 NOTE — Anesthesia Preprocedure Evaluation (Addendum)
 Anesthesia Evaluation  Patient identified by MRN, date of birth, ID band Patient awake    Reviewed: Allergy & Precautions, NPO status , Patient's Chart, lab work & pertinent test results  History of Anesthesia Complications Negative for: history of anesthetic complications  Airway Mallampati: II  TM Distance: >3 FB Neck ROM: Full    Dental  (+) Dental Advisory Given   Pulmonary neg pulmonary ROS, neg COPD   breath sounds clear to auscultation       Cardiovascular hypertension, Pt. on medications and Pt. on home beta blockers (-) angina  Rhythm:Regular Rate:Normal     Neuro/Psych   Anxiety     negative neurological ROS     GI/Hepatic negative GI ROS, Neg liver ROS,,,  Endo/Other  Hypothyroidism    Renal/GU negative Renal ROS     Musculoskeletal  (+) Arthritis ,    Abdominal   Peds  Hematology negative hematology ROS (+)   Anesthesia Other Findings   Reproductive/Obstetrics                              Anesthesia Physical Anesthesia Plan  ASA: 3  Anesthesia Plan: MAC   Post-op Pain Management: Minimal or no pain anticipated   Induction: Intravenous  PONV Risk Score and Plan: 2 and Ondansetron , Treatment may vary due to age or medical condition and Propofol  infusion  Airway Management Planned: Simple Face Mask  Additional Equipment:   Intra-op Plan:   Post-operative Plan:   Informed Consent: I have reviewed the patients History and Physical, chart, labs and discussed the procedure including the risks, benefits and alternatives for the proposed anesthesia with the patient or authorized representative who has indicated his/her understanding and acceptance.     Dental advisory given  Plan Discussed with: CRNA and Surgeon  Anesthesia Plan Comments:          Anesthesia Quick Evaluation

## 2024-08-25 NOTE — Op Note (Signed)
 08/25/2024  12:40 PM  PATIENT:  Destiny [name]  83 y.o. female  PRE-OPERATIVE DIAGNOSIS:  1.  Right 3rd and 4th hammertoes     2.  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  Right third and fourth MTP joint dorsal capsulotomies 3.  AP, oblique and lateral xrays of the right foot  SURGEON:  Norleen Armor, MD  ASSISTANT: none  ANESTHESIA:   local, mac  EBL:  minimal   TOURNIQUET:  * No tourniquets in log *  COMPLICATIONS:  None apparent  DISPOSITION:  Extubated, awake and stable to recovery.  INDICATION FOR PROCEDURE: 83 year old female with complex past medical history complains of continued pain at the right 3rd and 4th toes.  She has recurrent hammertoe deformities and dorsal MTP joint contractures after multiple surgeries over many years.  She has failed nonoperative treatment and presents today for revision hammertoe correction and dorsal capsulotomies.  The risks and benefits of the alternative treatment options have been discussed in detail.  The patient wishes to proceed with surgery and specifically understands risks of bleeding, infection, nerve damage, blood clots, need for additional surgery, amputation and death.   PROCEDURE IN DETAIL:  After pre operative consent was obtained, and the correct operative site was identified, the patient was brought to the operating room and placed supine on the OR table.  Anesthesia was administered.  Pre-operative antibiotics were administered.  A surgical timeout was taken.  The right lower extremity was prepped and draped in standard sterile fashion.  Metatarsal blocks were performed at the 3rd and 4th rays with half percent Marcaine  with epinephrine .  An AP radiograph was obtained.  Under fluoroscopic guidance a Beaver blade was inserted into the third MTP joint.  The blade was then passed dorsally releasing the dorsal joint capsule  and extensor tendons.  The same procedure was performed for the fourth MTP joint.  This allowed passive plantarflexion of the MTP joints past neutral.  Attention was turned to the  third toe.  The PIP joint was identified under fluoroscopy.  An incision was made with the tip of the [facility] blade adjacent to the PIP joint.  The 2 x 8 mm Arthrex bur was inserted and used to remove the remaining articular cartilage and subchondral bone from both sides of the joint.  A K wire for the 2.5 mm Arthrex headless compression screw was then advanced across the IP joints from the tip of the toe to the base of the proximal phalanx.  Radiographs confirmed appropriate position of the guidepin.  It was overdrilled.  A 20 mm x 2.5 mm headless compression screw was inserted and seated across the PIP joint.  Radiographs confirmed appropriate position of the screw.  The same procedure was then performed for the fourth toe again with a 20 mm x 2.5 mm headless compression screw.  AP, lateral and oblique radiographs confirmed appropriate correction of the hammertoe deformities and alignment of the toes to the MP joint.  The patient did not require distal metatarsal osteotomies.  Wounds were irrigated copiously.  Sterile dressings were applied after closing of the distal incisions with 3-0 nylon.  Sterile dressings were applied followed by compression wrap.  The patient was awakened from anesthesia and transported to the recovery room in stable condition.   FOLLOW UP PLAN:  wbat in a flat post op shoe.  No indication for DVT prophylaxis in this ambulatory patient.  F/u in two weeks in the office for suture removal and possible budin splint.   RADIOGRAPHS: AP lateral and oblique radiographs of the right foot are obtained intraoperatively.  These show interval correction of the hammertoe deformities with PIP joint arthrodesis.  Hardware is appropriately positioned and of the appropriate lengths.  No other acute injuries are noted.   Previous hardware is noted in the 2nd, 3rd and 4th metatarsals.

## 2024-08-26 ENCOUNTER — Encounter (HOSPITAL_BASED_OUTPATIENT_CLINIC_OR_DEPARTMENT_OTHER): Payer: Self-pay | Admitting: Orthopedic Surgery

## 2024-08-30 ENCOUNTER — Other Ambulatory Visit: Payer: Self-pay

## 2024-08-30 DIAGNOSIS — R600 Localized edema: Secondary | ICD-10-CM

## 2024-08-31 NOTE — Progress Notes (Signed)
 Radiation Oncology         [phone] ________________________________  Name: Destiny [name] MRN: [id number]  Date: 09/01/2024  DOB: 12/28/1940  Follow-Up Visit Note  CC: Micheal Wolm ORN, MD  Micheal Wolm ORN, MD  No diagnosis found.  Diagnosis:  Stage IIIB (pT3, cN0) Liposarcoma of the Right Thigh, Grade 3     Interval Since Last Radiation: 5 years, 22 days   Radiation Treatment Dates: 06/27/2019 through 08/12/2019 Site Technique Total Dose Dose per Fx Completed Fx Beam Energies  Extremity: Ext_Rt 3D 50/50 2 25/25 6X, 10X  Extremity: Ext_Rt_Bst 3D 16/16 2 8/8 6X     Narrative:  The patient returns today for routine follow-up. She was last seen in office on 04/19/24 for a follow up visit. Patient continued to follow up with their specialists to manage their chronic conditions.    In the interval since she was last seen, she presented at the ED on 08/25/24 for surgical correction of her 3rd and 4th hammertoe deformities.   No other significant oncologic interval history since the patient was last seen.    Allergies:  is allergic to nsaids, oxycodone-acetaminophen , aspirin , augmentin [amoxicillin-pot clavulanate], ciprocin-fluocin-procin [fluocinolone], ciprofloxacin  hcl, fentanyl , fluclorolone, lyrica [pregabalin], versed  [midazolam ], diclofenac sodium, and hydromorphone  hcl.  Meds: Current Outpatient Medications  Medication Sig Dispense Refill   acetaminophen  (TYLENOL ) 500 MG tablet Take 500 mg by mouth every 4 (four) hours as needed.     amLODipine  (NORVASC ) 5 MG tablet Take 1 tablet (5 mg total) by mouth daily. 90 tablet 3   b complex vitamins capsule Take 1 capsule by mouth daily.     Calcium Carb-Cholecalciferol 600-200 MG-UNIT TABS Take 1 tablet by mouth daily.      cholecalciferol (VITAMIN D) 1000 UNITS tablet Take 2,000 Units by mouth daily.     CRANBERRY PO Take by mouth.     D-MANNOSE PO Take by mouth.     gabapentin  (NEURONTIN ) 300 MG capsule Take 600-900  mg by mouth See admin instructions. Take 300 mg e3xday     hydrALAZINE  (APRESOLINE ) 25 MG tablet TAKE 1 TABLET BY MOUTH TWICE DAILY. 180 tablet 1   HYDROcodone -acetaminophen  (NORCO/VICODIN) 5-325 MG tablet Take 1 tablet by mouth every 4 (four) hours as needed for moderate pain or severe pain.      ipratropium (ATROVENT ) 0.06 % nasal spray PLACE 2 SPRAYS IN EACH NOSTRIL 3 TIMES A DAY 15 mL 2   levothyroxine  (SYNTHROID ) 137 MCG tablet TAKE 1 TABLET BY MOUTH DAILY BEFORE BREAKFAST. 90 tablet 1   loratadine-pseudoephedrine (CLARITIN-D 12-HOUR) 5-120 MG per tablet Take 1 tablet by mouth daily.     metoprolol  succinate (TOPROL -XL) 50 MG 24 hr tablet TAKE 1 TABLET BY MOUTH 2 TIMES DAILY. 180 tablet 2   olmesartan  (BENICAR ) 40 MG tablet TAKE 1 TABLET BY MOUTH DAILY. 90 tablet 2   polyethylene glycol (MIRALAX ) 17 g packet Take 17 g by mouth daily. 14 each 0   Soybean-Soy Isoflavones (SOY BALANCE PO) Take 1 tablet by mouth daily. Soy bean extract     trimethoprim (TRIMPEX) 100 MG tablet Take 100 mg by mouth daily.     Vibegron (GEMTESA) 75 MG TABS Take 75 mg by mouth at bedtime.     zolpidem  (AMBIEN ) 5 MG tablet TAKE 1 TABLET BY MOUTH ONCE DAILY AT BEDTIME 90 tablet 1   No current facility-administered medications for this visit.    Physical Findings: The patient is in no acute distress. Patient is alert and oriented.  vitals were not taken for this visit. .  No significant changes. Lungs are clear to auscultation bilaterally. Heart has regular rate and rhythm. No palpable cervical, supraclavicular, or axillary adenopathy. Abdomen soft, non-tender, normal bowel sounds.   Lab Findings: Lab Results  Component Value Date   WBC 13.1 (H) 04/15/2022   HGB 12.1 04/15/2022   HCT 35.6 (L) 04/15/2022   MCV 100.3 (H) 04/15/2022   PLT 344 04/15/2022    Radiographic Findings: DG MINI C-ARM IMAGE ONLY Result Date: 08/25/2024 There is no interpretation for this exam.  This order is for images obtained  during a surgical procedure.  Please See Surgeries Tab for more information regarding the procedure.    Impression:  Stage IIIB (pT3, cN0) Liposarcoma of the Right Thigh, Grade 3    The patient is recovering from the effects of radiation.  ***  Plan:  ***   *** minutes of total time was spent for this patient encounter, including preparation, face-to-face counseling with the patient and coordination of care, physical exam, and documentation of the encounter. ____________________________________  Lynwood CHARM Nasuti, PhD, MD  This document serves as a record of services personally performed by Lynwood Nasuti, MD. It was created on his behalf by Reymundo Cartwright, a trained medical scribe. The creation of this record is based on the scribe's personal observations and the provider's statements to them. This document has been checked and approved by the attending provider.

## 2024-09-01 ENCOUNTER — Other Ambulatory Visit: Payer: Self-pay

## 2024-09-01 ENCOUNTER — Encounter: Payer: Self-pay | Admitting: Rehabilitation

## 2024-09-01 ENCOUNTER — Encounter: Payer: Self-pay | Admitting: Radiation Oncology

## 2024-09-01 ENCOUNTER — Ambulatory Visit: Attending: Radiation Oncology | Admitting: Rehabilitation

## 2024-09-01 ENCOUNTER — Ambulatory Visit
Admission: RE | Admit: 2024-09-01 | Discharge: 2024-09-01 | Disposition: A | Discharge status: Home / Self Care | Source: Ambulatory Visit | Attending: Radiation Oncology | Admitting: Radiation Oncology

## 2024-09-01 DIAGNOSIS — M6281 Muscle weakness (generalized): Secondary | ICD-10-CM | POA: Insufficient documentation

## 2024-09-01 DIAGNOSIS — C4921 Malignant neoplasm of connective and soft tissue of right lower limb, including hip: Secondary | ICD-10-CM

## 2024-09-01 DIAGNOSIS — I890 Lymphedema, not elsewhere classified: Secondary | ICD-10-CM | POA: Insufficient documentation

## 2024-09-01 DIAGNOSIS — R600 Localized edema: Secondary | ICD-10-CM | POA: Diagnosis not present

## 2024-09-01 DIAGNOSIS — R2689 Other abnormalities of gait and mobility: Secondary | ICD-10-CM | POA: Insufficient documentation

## 2024-09-01 NOTE — Progress Notes (Signed)
 Destiny [name] is here today for follow up post radiation to the right thigh  They completed their radiation on: 08/12/19   Does the patient complain of any of the following:  Pain: Yes patient  reports pain to foot due to surgery.  Swelling: Yes, patient is currently being seen by PT for lymphedema.  Post radiation skin changes: No Ambulation: Patient continues to have balance issues.  Fatigue: Yes    Additional comments if applicable:   BP (!) (P) 121/56 (BP Location: Left Arm, Patient Position: Sitting)   Pulse (P) 66   Temp (!) (P) 96 F (35.6 C) (Temporal)   Resp (P) 18   Ht (P) 5' 2 (1.575 m)   Wt (P) 156 lb 8 oz (71 kg)   BMI (P) 28.62 kg/m

## 2024-09-01 NOTE — Therapy (Signed)
 OUTPATIENT PHYSICAL THERAPY LOWER EXTREMITY LYMPHEDEMA EVALUATION  Patient Name: Destiny [name] MRN: [id number] DOB:1940-12-18, 83 y.o., female Today's Date: 09/01/2024  END OF SESSION:  PT End of Session - 09/01/24 1211     Visit Number 1    Number of Visits 2    Date for Recertification  09/29/24    Authorization Type needed    PT Start Time 1000    PT Stop Time 1058    PT Time Calculation (min) 58 min    Activity Tolerance Patient tolerated treatment well    Behavior During Therapy [facility] for tasks assessed/performed           Past Medical History:  Diagnosis Date   Anxiety    Arthritis    Colon polyps    Complication of anesthesia    BP drops with fentyl and versed    Fatty liver    Hepatic lesion    Hepatitis    hep A  1976   History of hepatitis A    1976   History of radiation therapy    Right thigh- 07/24/19-08/12/19- Dr. Lynwood Nasuti   Hypertension    Hypothyroidism    Internal hemorrhoids    Liposarcoma of right thigh (HCC) 03/2019   Past Surgical History:  Procedure Laterality Date   ABDOMINAL HYSTERECTOMY     APPENDECTOMY     BACK SURGERY      fusion   CESAREAN SECTION     times 2   COLONOSCOPY     EYE SURGERY Left    cataract removed   FINGER ARTHRODESIS Left 10/02/2017   Procedure: LEFT LONG FINGER ARTHRODESIS;  Surgeon: Sebastian Lenis, MD;  Location: [facility];  Service: Orthopedics;  Laterality: Left;   FOOT SURGERY Bilateral    FRACTURE SURGERY     broken ankle   HAMMER TOE SURGERY Right 08/25/2024   Procedure: right third and fourth hammertoe correections, and dorsal capsulotomies;  Surgeon: Kit Rush, MD;  Location: [facility];  Service: Orthopedics;  Laterality: Right;   MEDIAL PARTIAL KNEE REPLACEMENT Left    done at [facility]   REVERSE SHOULDER ARTHROPLASTY Right 12/28/2014   Procedure: REVERSE SHOULDER ARTHROPLASTY;  Surgeon: Eva Elsie Herring, MD;  Location: [facility] OR;  Service: Orthopedics;   Laterality: Right;  Right reverse total shoulder arthroplasty   REVERSE TOTAL SHOULDER ARTHROPLASTY Right 12/28/2014   DR CHANDLER   rotator cufff     THYROIDECTOMY, PARTIAL     one lobe removed   VARICOSE VEIN SURGERY Right    WEIL OSTEOTOMY Right 08/25/2024   Procedure: right third and fouth minimally invasive metatarsal weil osteotomies;  Surgeon: Kit Rush, MD;  Location: [facility];  Service: Orthopedics;  Laterality: Right;   Patient Active Problem List   Diagnosis Date Noted   Abnormal involuntary movement 12/26/2020   Body mass index (BMI) 27.0-27.9, adult 12/26/2020   Chronic anxiety 12/26/2020   Constipation 12/26/2020   Easy bruising 12/26/2020   Edema of lower extremity 12/26/2020   Localized swelling, mass and lump, lower limb 12/26/2020   Encounter for general adult medical examination with abnormal findings 12/26/2020   Family history of breast cancer gene mutation in first degree relative 12/26/2020   Fatty liver 12/26/2020   Hearing loss 12/26/2020   History of thrombophlebitis 12/26/2020   Hyponatremia 12/26/2020   Muscle cramps 12/26/2020   Osteoarthritis 12/26/2020   Osteopenia 12/26/2020   Personal history of other malignant neoplasm of skin 12/26/2020   Postoperative  hypothyroidism 12/26/2020   Raynaud's disease 12/26/2020   Lymphedema of right lower extremity 10/05/2020   Essential tremor 10/05/2019   Near syncope 04/28/2019   Adjustment disorder with mixed anxiety and depressed mood 04/28/2019   Liposarcoma of right thigh (HCC) 04/28/2019   Osteoarthritis of right knee 03/10/2019   Hip pain, bilateral 01/07/2018   Sinus tarsi syndrome 08/20/2016   Right knee pain 07/17/2016   S/p reverse total shoulder arthroplasty 12/28/2014   Abnormality of gait 04/25/2014   Foot pain 06/03/2011   Metatarsalgia 06/03/2011   Chronic pain syndrome 12/05/2009   Chronic insomnia 08/15/2009   INGROWN TOENAIL 06/06/2009   LEG PAIN, BILATERAL  06/05/2009   OTHER VITAMIN B12 DEFICIENCY ANEMIA 04/04/2009   Megaloblastic anemia due to vitamin B12 deficiency 04/04/2009   MALAISE AND FATIGUE 02/28/2009   Hypothyroidism 07/26/2008   NEUROPATHY, IDIOPATHIC PERIPHERAL NEC 09/16/2007   Essential hypertension 01/28/2007   Arthritis of hand, degenerative 01/28/2007   ROTATOR CUFF INJURY, RIGHT SHOULDER 01/28/2007   OSTEOPENIA 01/28/2007   Disorder of bone and cartilage 01/28/2007    PCP: Dr. Wolm Scarlet  REFERRING PROVIDER: Dr. Lynwood Nasuti  REFERRING DIAG:  Diagnosis  C49.21 (ICD-10-CM) - Liposarcoma of right thigh (HCC)    THERAPY DIAG:  Lymphedema  Other abnormalities of gait and mobility  Muscle weakness (generalized)  Rationale for Evaluation and Treatment: Rehabilitation  ONSET DATE: 2020  SUBJECTIVE:                                                                                                                                                                                           SUBJECTIVE STATEMENT: I had a surgery to straighten out my 3rd and 4th toe.  I got the initial dressing off yesterday.  I have to wear the shoe as much as I can.  I am supposed to wear an ace bandage.  They will be getting my pump refit tomorrow.  Nothing I am not supposed to do.  I was not able to get any compression on when it was bandaged.     PERTINENT HISTORY: Liposarcoma of the Rt thigh with sartorious removed 11.5 cm intramuscular mass. Radical resection performed 04/15/19. Finished radiation 08/12/2019. Most recent follow up show continued NED and no METS . Known to this clinic: Has compression Juzo Ready wrap full leg and tribute night garments.  Has flexitouch system.    PAIN:  Are you having pain? YES 2/10 In the toes and my back has been hurting due to walking funny I am taking a muscle relaxer and Tylenol .   Increased pain in the foot when walking It feels burning  PRECAUTIONS: Rt LE lymphedema   RED  FLAGS: None   WEIGHT BEARING RESTRICTIONS: No  FALLS:  Has patient fallen in last 6 months? No  LIVING ENVIRONMENT: Lives with: lives with their family and lives with their spouse  OCCUPATION: Retired  LEISURE: nothing special   PATIENT GOALS: I want you to evaluate it     OBJECTIVE: Note: Objective measures were completed at Evaluation unless otherwise noted.  COGNITION:  Overall cognitive status: Within functional limits for tasks assessed   OBSERVATIONS / OTHER ASSESSMENTS: Larger Rt full leg with more of a pitting fluctuating edema in the lower leg and more of a solid state in the thigh and Rt glute - see photos below.  Sent note to Dr. Shannon per pt request regarding telangectasia behind the knee as she is not sure if this is always there or now.          LYMPHEDEMA ASSESSMENTS:    LE LANDMARK RIGHT 2023 08/27/23 09/01/24  At groin     30 cm proximal to suprapatella     20 cm proximal to suprapatella 54.5 56.5 55.5  10 cm proximal to suprapatella 42 42 41.8  At midpatella / popliteal crease 39 39 38.5  30 cm proximal to floor at lateral plantar foot 37.5 37.5 37.6  20 cm proximal to floor at lateral plantar foot 30.5 29.7 31  10  cm proximal to floor at lateral plantar foot 26.3 25.0 25.8  Circumference of ankle/heel     5 cm proximal to 1st MTP joint 21.5 21.5 22  Across MTP joint 21.7 21.9 22.4  Around proximal great toe 7.8 8.1 8.1  (Blank rows = not tested) +1-2 pitting Rt lower leg, not thigh or glute region Rt from umbilicus to midline spine: 51cm  Lt: 49cm  Upper leg (above knee): Slight decrease in circumference. Lower leg and foot: Slight increase, especially around the calf and ankle regions. Overall volume: As calculated earlier, there's a small increase (~0.72%) in total leg volume. (From copilot calculations)   TODAY'S TREATMENT:                                                                                                                                          DATE:  09/01/24 Eval performed Discussed POC and garments Then positioned supine with wedge and 1 towel under the knee: Rt inguinal nodes, Then Rt Leg from proximal to distal and then retracing all steps proximally with focus on popliteal nodes and rerouting around the thigh scar tissue.   Then reapplied ace wrap per MD instruction and cast shoe.  Reminded pt to take off the ace wrap if needed and reapply if uncomfortable.   Pt will call if she needs any more visits     PATIENT EDUCATION:  Education details: per today's note Person educated: Patient Education method: Explanation and Demonstration Education comprehension: verbalized understanding  HOME  EXERCISE PROGRAM: Continue pump, MLD, use of garments  ASSESSMENT:  CLINICAL IMPRESSION: Patient is a 83 y.o. pt who is know to this clinic who recently had surgery on her 3rd and 4th toes and would like to make sure that her leg is not increasing in size.  She has stage 2-3 lymphedema of the Rt lower extremity and this is controlled with her flexitouch device which she will get a replacement for tomorrow and her day and night garments which she was unable to wear due to surgery.  Overall her leg is mostly unchanged.  Her foot and lower leg are very slightly increased but the knee and upper leg are consistent.  Overall there has been a .79% decrease in size.  Performed some post-op MLD of the Rt leg avoiding the foot and toes. Pt would like to call if any more visits are needed.      OBJECTIVE IMPAIRMENTS: Abnormal gait, decreased knowledge of use of DME, increased edema, and increased fascial restrictions.   ACTIVITY LIMITATIONS: standing and squatting  PARTICIPATION LIMITATIONS: none  PERSONAL FACTORS: Age, Time since onset of injury/illness/exacerbation, and 1-2 comorbidities: radiation hx, chronic seroma are also affecting patient's functional outcome.   REHAB POTENTIAL: Excellent  CLINICAL DECISION MAKING:  Stable/uncomplicated  EVALUATION COMPLEXITY: Low   GOALS: Goals reviewed with patient? Yes    LONG TERM GOALS: Target date: 09/29/24  1.  Pt will have a visit from flexitouch for sequence changes for the upper thigh and glute and a new device Baseline:  Goal status: INITIAL   PLAN:  PT FREQUENCY: 1x/month  PT DURATION: 12 weeks  PLANNED INTERVENTIONS: Therapeutic exercises, Therapeutic activity, Patient/Family education, Self Care, Prosthetic training, DME instructions, Manual therapy, and Re-evaluation  PLAN FOR NEXT SESSION: as needed     Larue Saddie SAUNDERS, PT 09/01/2024, 12:12 PM

## 2024-09-13 ENCOUNTER — Ambulatory Visit: Admitting: Family Medicine

## 2024-09-13 VITALS — BP 140/60 | HR 67 | Temp 97.9°F | Wt 153.9 lb

## 2024-09-13 DIAGNOSIS — R3915 Urgency of urination: Secondary | ICD-10-CM | POA: Diagnosis not present

## 2024-09-13 DIAGNOSIS — E890 Postprocedural hypothyroidism: Secondary | ICD-10-CM

## 2024-09-13 DIAGNOSIS — I10 Essential (primary) hypertension: Secondary | ICD-10-CM | POA: Diagnosis not present

## 2024-09-13 DIAGNOSIS — E871 Hypo-osmolality and hyponatremia: Secondary | ICD-10-CM

## 2024-09-13 NOTE — Patient Instructions (Signed)
 Consider RSV (respiratory syncitial virus) vaccine at some point this year.    We do need to get thyroid  labs next visit.

## 2024-09-13 NOTE — Progress Notes (Signed)
 Established Patient Office Visit  Subjective   Patient ID: Destiny [name], female    DOB: 1941-08-02  Age: 83 y.o. MRN: [id number]  Chief Complaint  Patient presents with   Medical Management of Chronic Issues    HPI    Destiny [name] is seen today for ongoing medical follow-up.  She has history of Raynaud's disease, hypertension, history of fatty liver, postoperative hypothyroidism, essential tremor, osteoarthritis involving multiple joints, history of liposarcoma right thigh with surgery back in 2020, chronic mild hyponatremia, urinary urgency.  She is followed by urology and states she had breakthrough UTI on Keflex in July and was switched to trimethoprim.  Urgency symptoms treated with Gemtesa.  She is currently getting surveillance imaging through radiation oncology and is approximately 5 years out from her date of surgery.  She is getting periodic chest imaging as well.  She relates recent surgery right foot for hammertoes and that is going well.  She has hypertension treated with hydralazine , metoprolol , Benicar , and amlodipine .  Blood pressure generally well-controlled.  No orthostatic symptoms.  She has hypothyroidism and has been on stable dosage of levothyroxine  137 mcg for years.  Closing in on a year since last check.  She request waiting till next visit for labs.  Chronic hyponatremia.  No thiazide use.  No SSRI use.  Last sodium stable 128.  Past Medical History:  Diagnosis Date   Anxiety    Arthritis    Colon polyps    Complication of anesthesia    BP drops with fentyl and versed    Fatty liver    Hepatic lesion    Hepatitis    hep A  1976   History of hepatitis A    1976   History of radiation therapy    Right thigh- 07/24/19-08/12/19- Dr. Lynwood Nasuti   Hypertension    Hypothyroidism    Internal hemorrhoids    Liposarcoma of right thigh (HCC) 03/2019   Past Surgical History:  Procedure Laterality Date   ABDOMINAL HYSTERECTOMY     APPENDECTOMY     BACK  SURGERY      fusion   CESAREAN SECTION     times 2   COLONOSCOPY     EYE SURGERY Left    cataract removed   FINGER ARTHRODESIS Left 10/02/2017   Procedure: LEFT LONG FINGER ARTHRODESIS;  Surgeon: Sebastian Lenis, MD;  Location: [facility];  Service: Orthopedics;  Laterality: Left;   FOOT SURGERY Bilateral    FRACTURE SURGERY     broken ankle   HAMMER TOE SURGERY Right 08/25/2024   Procedure: right third and fourth hammertoe correections, and dorsal capsulotomies;  Surgeon: Kit Rush, MD;  Location: [facility];  Service: Orthopedics;  Laterality: Right;   MEDIAL PARTIAL KNEE REPLACEMENT Left    done at [facility]   REVERSE SHOULDER ARTHROPLASTY Right 12/28/2014   Procedure: REVERSE SHOULDER ARTHROPLASTY;  Surgeon: Eva Elsie Herring, MD;  Location: [facility];  Service: Orthopedics;  Laterality: Right;  Right reverse total shoulder arthroplasty   REVERSE TOTAL SHOULDER ARTHROPLASTY Right 12/28/2014   DR CHANDLER   rotator cufff     THYROIDECTOMY, PARTIAL     one lobe removed   VARICOSE VEIN SURGERY Right    WEIL OSTEOTOMY Right 08/25/2024   Procedure: right third and fouth minimally invasive metatarsal weil osteotomies;  Surgeon: Kit Rush, MD;  Location: [facility];  Service: Orthopedics;  Laterality: Right;    reports that she has never smoked. She has never  used smokeless tobacco. She reports current alcohol use. She reports that she does not use drugs. family history includes Atrial fibrillation in her mother; Cancer - Other in her father; Heart disease in her mother; Pancreatic cancer in her father and maternal uncle. Allergies  Allergen Reactions   Nsaids Other (See Comments)     BRUISES EASILY Other Reaction: BRUISES EASILY Other Reaction: BRUISES EASILY    Oxycodone-Acetaminophen  Other (See Comments)     VERY CONSTIPATING, does not provide pain relief   Aspirin  Other (See Comments)    Bruising    Augmentin [Amoxicillin-Pot  Clavulanate] Other (See Comments)    headache   Ciprocin-Fluocin-Procin [Fluocinolone]    Ciprofloxacin  Hcl     tendonitis   Fentanyl  Other (See Comments)    Does not work   Fluclorolone Other (See Comments)   Lyrica [Pregabalin] Other (See Comments)    dysphoria   Versed  [Midazolam ] Other (See Comments)    Does not work   Diclofenac Sodium Other (See Comments) and Rash    Skin peeling. Voltaren gel-rash and skin peeling    Hydromorphone  Hcl Itching     Itching with large doses - able to tolerate lower doses or shorter duration    Review of Systems  Constitutional:  Negative for fever.  Eyes:  Negative for blurred vision.  Respiratory:  Negative for shortness of breath.   Cardiovascular:  Negative for chest pain.  Neurological:  Negative for dizziness, weakness and headaches.      Objective:     BP (!) 140/60   Pulse 67   Temp 97.9 F (36.6 C) (Oral)   Wt 153 lb 14.4 oz (69.8 kg)   SpO2 96%   BMI (P) 28.15 kg/m  BP Readings from Last 3 Encounters:  09/13/24 (!) 140/60  09/01/24 (!) (P) 121/56  08/25/24 (!) 140/50   Wt Readings from Last 3 Encounters:  09/13/24 153 lb 14.4 oz (69.8 kg)  09/01/24 (P) 156 lb 8 oz (71 kg)  08/25/24 153 lb 14.1 oz (69.8 kg)      Physical Exam Vitals reviewed.  Constitutional:      General: She is not in acute distress.    Appearance: She is not ill-appearing.  Cardiovascular:     Rate and Rhythm: Normal rate and regular rhythm.  Pulmonary:     Effort: Pulmonary effort is normal.     Breath sounds: Normal breath sounds. No wheezing or rales.  Musculoskeletal:     Comments: Some chronic edema right lower extremity compared with left.  She has compression stocking on.  Neurological:     Mental Status: She is alert.      No results found for any visits on 09/13/24.  Last CBC Lab Results  Component Value Date   WBC 13.1 (H) 04/15/2022   HGB 12.1 04/15/2022   HCT 35.6 (L) 04/15/2022   MCV 100.3 (H) 04/15/2022   MCH  34.1 (H) 04/15/2022   RDW 12.5 04/15/2022   PLT 344 04/15/2022   Last metabolic panel Lab Results  Component Value Date   GLUCOSE 95 08/22/2024   NA 128 (L) 08/22/2024   K 5.4 (H) 08/22/2024   CL 93 (L) 08/22/2024   CO2 23 08/22/2024   BUN 29 (H) 08/22/2024   CREATININE 1.04 (H) 08/22/2024   GFRNONAA 53 (L) 08/22/2024   CALCIUM 8.8 (L) 08/22/2024   PROT 7.1 09/28/2023   ALBUMIN 4.4 09/28/2023   BILITOT 0.3 09/28/2023   ALKPHOS 60 09/28/2023   AST 23 09/28/2023  ALT 17 09/28/2023   ANIONGAP 12 08/22/2024   Last thyroid  functions Lab Results  Component Value Date   TSH 1.84 09/28/2023   FREET4 1.5 07/26/2020      The ASCVD Risk score (Arnett DK, et al., 2019) failed to calculate for the following reasons:   The 2019 ASCVD risk score is only valid for ages 28 to 14    Assessment & Plan:   #1 hypertension.  Generally well-controlled.  Systolic today 140 and repeat same after rest.  Continue current regimen as above.  Continue monitoring and be in touch if consistently over 140 systolic  #2 hypothyroidism.  She has postsurgical hypothyroidism.  Due for repeat TSH soon.  Patient declines checking today but agrees to check next visit in the spring.  #3 chronic mild hyponatremia.  Last sodium 128.  She does to watch sodium intake in terms of hypertension but not to be extremely restrictive.  She does not take any thiazides or SSRI.  Levels have been stable over several years  #4 chronic urine urgency stable on Louanne Wolm Scarlet, MD

## 2024-09-28 ENCOUNTER — Other Ambulatory Visit: Payer: Self-pay | Admitting: Family Medicine

## 2024-10-01 ENCOUNTER — Other Ambulatory Visit: Payer: Self-pay | Admitting: Family Medicine

## 2024-10-17 ENCOUNTER — Other Ambulatory Visit: Payer: Self-pay | Admitting: Family Medicine

## 2024-11-01 ENCOUNTER — Other Ambulatory Visit: Payer: Self-pay | Admitting: Family Medicine

## 2024-12-03 ENCOUNTER — Other Ambulatory Visit: Payer: Self-pay | Admitting: Family Medicine

## 2024-12-15 ENCOUNTER — Other Ambulatory Visit: Payer: Self-pay | Admitting: Family Medicine

## 2025-01-10 ENCOUNTER — Ambulatory Visit: Admitting: Sports Medicine

## 2025-01-23 ENCOUNTER — Ambulatory Visit: Payer: Medicare Other

## 2025-03-14 ENCOUNTER — Ambulatory Visit: Admitting: Family Medicine

## 2025-04-03 ENCOUNTER — Ambulatory Visit: Admitting: Radiation Oncology
